# Patient Record
Sex: Female | Born: 1965
Health system: Southern US, Community
[De-identification: ages and names within clinical notes are randomized; demographics above are authoritative.]

## PROBLEM LIST (undated history)

## (undated) ENCOUNTER — Emergency Department (HOSPITAL_BASED_OUTPATIENT_CLINIC_OR_DEPARTMENT_OTHER): Admission: EM | Payer: BLUE CROSS/BLUE SHIELD | Source: Home / Self Care

## (undated) DIAGNOSIS — B1920 Unspecified viral hepatitis C without hepatic coma: Secondary | ICD-10-CM

## (undated) DIAGNOSIS — E049 Nontoxic goiter, unspecified: Secondary | ICD-10-CM

## (undated) DIAGNOSIS — E559 Vitamin D deficiency, unspecified: Secondary | ICD-10-CM

## (undated) DIAGNOSIS — B019 Varicella without complication: Secondary | ICD-10-CM

## (undated) DIAGNOSIS — E669 Obesity, unspecified: Secondary | ICD-10-CM

## (undated) DIAGNOSIS — I10 Essential (primary) hypertension: Secondary | ICD-10-CM

## (undated) DIAGNOSIS — K760 Fatty (change of) liver, not elsewhere classified: Secondary | ICD-10-CM

## (undated) DIAGNOSIS — E119 Type 2 diabetes mellitus without complications: Secondary | ICD-10-CM

## (undated) DIAGNOSIS — F329 Major depressive disorder, single episode, unspecified: Secondary | ICD-10-CM

## (undated) DIAGNOSIS — R739 Hyperglycemia, unspecified: Secondary | ICD-10-CM

## (undated) DIAGNOSIS — E079 Disorder of thyroid, unspecified: Secondary | ICD-10-CM

## (undated) DIAGNOSIS — E785 Hyperlipidemia, unspecified: Secondary | ICD-10-CM

## (undated) DIAGNOSIS — F419 Anxiety disorder, unspecified: Secondary | ICD-10-CM

## (undated) DIAGNOSIS — Z0000 Encounter for general adult medical examination without abnormal findings: Secondary | ICD-10-CM

## (undated) DIAGNOSIS — A078 Other specified protozoal intestinal diseases: Secondary | ICD-10-CM

## (undated) DIAGNOSIS — G4730 Sleep apnea, unspecified: Secondary | ICD-10-CM

## (undated) DIAGNOSIS — T7840XA Allergy, unspecified, initial encounter: Secondary | ICD-10-CM

## (undated) DIAGNOSIS — J189 Pneumonia, unspecified organism: Secondary | ICD-10-CM

## (undated) DIAGNOSIS — Z124 Encounter for screening for malignant neoplasm of cervix: Secondary | ICD-10-CM

## (undated) HISTORY — DX: Nontoxic goiter, unspecified: E04.9

## (undated) HISTORY — DX: Varicella without complication: B01.9

## (undated) HISTORY — DX: Allergy, unspecified, initial encounter: T78.40XA

## (undated) HISTORY — DX: Hyperlipidemia, unspecified: E78.5

## (undated) HISTORY — DX: Type 2 diabetes mellitus without complications: E11.9

## (undated) HISTORY — DX: Sleep apnea, unspecified: G47.30

## (undated) HISTORY — DX: Obesity, unspecified: E66.9

## (undated) HISTORY — DX: Major depressive disorder, single episode, unspecified: F32.9

## (undated) HISTORY — DX: Hyperglycemia, unspecified: R73.9

## (undated) HISTORY — DX: Vitamin D deficiency, unspecified: E55.9

## (undated) HISTORY — DX: Encounter for screening for malignant neoplasm of cervix: Z12.4

## (undated) HISTORY — DX: Anxiety disorder, unspecified: F41.9

## (undated) HISTORY — DX: Pneumonia, unspecified organism: J18.9

## (undated) HISTORY — DX: Disorder of thyroid, unspecified: E07.9

## (undated) HISTORY — PX: OTHER SURGICAL HISTORY: SHX169

## (undated) HISTORY — PX: WISDOM TOOTH EXTRACTION: SHX21

## (undated) HISTORY — DX: Other specified protozoal intestinal diseases: A07.8

## (undated) HISTORY — DX: Encounter for general adult medical examination without abnormal findings: Z00.00

---

## 2000-02-04 ENCOUNTER — Encounter: Payer: Self-pay | Admitting: Emergency Medicine

## 2000-02-04 ENCOUNTER — Emergency Department (HOSPITAL_COMMUNITY): Admission: EM | Admit: 2000-02-04 | Discharge: 2000-02-04 | Payer: Self-pay | Admitting: Emergency Medicine

## 2000-06-01 ENCOUNTER — Encounter: Payer: Self-pay | Admitting: Emergency Medicine

## 2000-06-01 ENCOUNTER — Emergency Department (HOSPITAL_COMMUNITY): Admission: EM | Admit: 2000-06-01 | Discharge: 2000-06-01 | Payer: Self-pay | Admitting: Emergency Medicine

## 2000-07-09 ENCOUNTER — Encounter: Payer: Self-pay | Admitting: Orthopedic Surgery

## 2000-07-09 ENCOUNTER — Encounter: Admission: RE | Admit: 2000-07-09 | Discharge: 2000-07-09 | Payer: Self-pay | Admitting: Orthopedic Surgery

## 2000-07-21 ENCOUNTER — Encounter: Admission: RE | Admit: 2000-07-21 | Discharge: 2000-08-25 | Payer: Self-pay | Admitting: Orthopedic Surgery

## 2000-12-21 ENCOUNTER — Emergency Department (HOSPITAL_COMMUNITY): Admission: EM | Admit: 2000-12-21 | Discharge: 2000-12-21 | Payer: Self-pay | Admitting: Emergency Medicine

## 2001-08-02 ENCOUNTER — Emergency Department (HOSPITAL_COMMUNITY): Admission: EM | Admit: 2001-08-02 | Discharge: 2001-08-02 | Payer: Self-pay | Admitting: *Deleted

## 2001-08-03 ENCOUNTER — Emergency Department (HOSPITAL_COMMUNITY): Admission: EM | Admit: 2001-08-03 | Discharge: 2001-08-03 | Payer: Self-pay | Admitting: Emergency Medicine

## 2002-04-20 ENCOUNTER — Emergency Department (HOSPITAL_COMMUNITY): Admission: EM | Admit: 2002-04-20 | Discharge: 2002-04-20 | Payer: Self-pay | Admitting: Emergency Medicine

## 2002-04-22 ENCOUNTER — Encounter: Payer: Self-pay | Admitting: Emergency Medicine

## 2002-04-22 ENCOUNTER — Ambulatory Visit (HOSPITAL_COMMUNITY): Admission: RE | Admit: 2002-04-22 | Discharge: 2002-04-22 | Payer: Self-pay | Admitting: Emergency Medicine

## 2002-05-02 ENCOUNTER — Emergency Department (HOSPITAL_COMMUNITY): Admission: EM | Admit: 2002-05-02 | Discharge: 2002-05-03 | Payer: Self-pay | Admitting: Emergency Medicine

## 2003-05-01 ENCOUNTER — Emergency Department (HOSPITAL_COMMUNITY): Admission: EM | Admit: 2003-05-01 | Discharge: 2003-05-01 | Payer: Self-pay | Admitting: *Deleted

## 2004-05-07 ENCOUNTER — Other Ambulatory Visit: Admission: RE | Admit: 2004-05-07 | Discharge: 2004-05-07 | Payer: Self-pay | Admitting: Obstetrics and Gynecology

## 2005-02-17 ENCOUNTER — Emergency Department (HOSPITAL_COMMUNITY): Admission: EM | Admit: 2005-02-17 | Discharge: 2005-02-17 | Payer: Self-pay | Admitting: Emergency Medicine

## 2007-05-16 ENCOUNTER — Emergency Department (HOSPITAL_COMMUNITY): Admission: EM | Admit: 2007-05-16 | Discharge: 2007-05-16 | Payer: Self-pay | Admitting: Emergency Medicine

## 2007-12-30 ENCOUNTER — Emergency Department (HOSPITAL_BASED_OUTPATIENT_CLINIC_OR_DEPARTMENT_OTHER): Admission: EM | Admit: 2007-12-30 | Discharge: 2007-12-30 | Payer: Self-pay | Admitting: Emergency Medicine

## 2008-05-22 ENCOUNTER — Ambulatory Visit: Payer: Self-pay | Admitting: Radiology

## 2008-05-22 ENCOUNTER — Emergency Department (HOSPITAL_BASED_OUTPATIENT_CLINIC_OR_DEPARTMENT_OTHER): Admission: EM | Admit: 2008-05-22 | Discharge: 2008-05-22 | Payer: Self-pay

## 2008-09-06 ENCOUNTER — Emergency Department (HOSPITAL_BASED_OUTPATIENT_CLINIC_OR_DEPARTMENT_OTHER): Admission: EM | Admit: 2008-09-06 | Discharge: 2008-09-06 | Payer: Self-pay | Admitting: Emergency Medicine

## 2008-11-20 ENCOUNTER — Emergency Department (HOSPITAL_BASED_OUTPATIENT_CLINIC_OR_DEPARTMENT_OTHER): Admission: EM | Admit: 2008-11-20 | Discharge: 2008-11-20 | Payer: Self-pay | Admitting: Emergency Medicine

## 2008-11-20 ENCOUNTER — Ambulatory Visit: Payer: Self-pay | Admitting: Diagnostic Radiology

## 2009-01-14 ENCOUNTER — Ambulatory Visit: Payer: Self-pay | Admitting: Diagnostic Radiology

## 2009-01-14 ENCOUNTER — Emergency Department (HOSPITAL_BASED_OUTPATIENT_CLINIC_OR_DEPARTMENT_OTHER): Admission: EM | Admit: 2009-01-14 | Discharge: 2009-01-14 | Payer: Self-pay | Admitting: Emergency Medicine

## 2009-03-09 ENCOUNTER — Emergency Department (HOSPITAL_BASED_OUTPATIENT_CLINIC_OR_DEPARTMENT_OTHER): Admission: EM | Admit: 2009-03-09 | Discharge: 2009-03-09 | Payer: Self-pay | Admitting: Emergency Medicine

## 2009-06-24 ENCOUNTER — Emergency Department (HOSPITAL_BASED_OUTPATIENT_CLINIC_OR_DEPARTMENT_OTHER): Admission: EM | Admit: 2009-06-24 | Discharge: 2009-06-24 | Payer: Self-pay | Admitting: Emergency Medicine

## 2009-07-01 ENCOUNTER — Emergency Department (HOSPITAL_BASED_OUTPATIENT_CLINIC_OR_DEPARTMENT_OTHER): Admission: EM | Admit: 2009-07-01 | Discharge: 2009-07-01 | Payer: Self-pay | Admitting: Emergency Medicine

## 2009-10-26 ENCOUNTER — Ambulatory Visit: Payer: Self-pay | Admitting: Radiology

## 2009-10-26 ENCOUNTER — Emergency Department (HOSPITAL_BASED_OUTPATIENT_CLINIC_OR_DEPARTMENT_OTHER): Admission: EM | Admit: 2009-10-26 | Discharge: 2009-10-26 | Payer: Self-pay | Admitting: Emergency Medicine

## 2009-10-27 ENCOUNTER — Ambulatory Visit (HOSPITAL_COMMUNITY): Admission: RE | Admit: 2009-10-27 | Discharge: 2009-10-27 | Payer: Self-pay | Admitting: Emergency Medicine

## 2009-10-27 ENCOUNTER — Ambulatory Visit: Payer: Self-pay | Admitting: Vascular Surgery

## 2010-03-22 ENCOUNTER — Emergency Department (HOSPITAL_BASED_OUTPATIENT_CLINIC_OR_DEPARTMENT_OTHER)
Admission: EM | Admit: 2010-03-22 | Discharge: 2010-03-22 | Payer: Self-pay | Source: Home / Self Care | Admitting: Emergency Medicine

## 2010-03-25 LAB — URINALYSIS, ROUTINE W REFLEX MICROSCOPIC
Bilirubin Urine: NEGATIVE
Hgb urine dipstick: NEGATIVE
Ketones, ur: NEGATIVE mg/dL
Specific Gravity, Urine: 1.027 (ref 1.005–1.030)
Urine Glucose, Fasting: NEGATIVE mg/dL
pH: 6 (ref 5.0–8.0)

## 2010-03-25 LAB — COMPREHENSIVE METABOLIC PANEL
Albumin: 3.8 g/dL (ref 3.5–5.2)
Alkaline Phosphatase: 103 U/L (ref 39–117)
BUN: 17 mg/dL (ref 6–23)
Calcium: 9.5 mg/dL (ref 8.4–10.5)
Creatinine, Ser: 0.9 mg/dL (ref 0.4–1.2)
Glucose, Bld: 104 mg/dL — ABNORMAL HIGH (ref 70–99)
Potassium: 3.6 mEq/L (ref 3.5–5.1)
Total Protein: 7.6 g/dL (ref 6.0–8.3)

## 2010-03-25 LAB — POCT CARDIAC MARKERS
CKMB, poc: 1.2 ng/mL (ref 1.0–8.0)
Myoglobin, poc: 66.9 ng/mL (ref 12–200)
Troponin i, poc: <0.05 ng/mL (ref 0.00–0.09)

## 2010-03-25 LAB — DIFFERENTIAL
Basophils Absolute: 0 10*3/uL (ref 0.0–0.1)
Eosinophils Absolute: 0.2 10*3/uL (ref 0.0–0.7)
Lymphocytes Relative: 43 % (ref 12–46)
Monocytes Absolute: 0.4 10*3/uL (ref 0.1–1.0)
Neutro Abs: 2.5 10*3/uL (ref 1.7–7.7)
Neutrophils Relative %: 46 % (ref 43–77)

## 2010-03-25 LAB — LIPASE, BLOOD: Lipase: 113 U/L (ref 23–300)

## 2010-03-25 LAB — CBC
MCHC: 33.2 g/dL (ref 30.0–36.0)
MCV: 74.6 fL — ABNORMAL LOW (ref 78.0–100.0)
Platelets: 208 10*3/uL (ref 150–400)
RDW: 14.6 % (ref 11.5–15.5)
WBC: 5.5 10*3/uL (ref 4.0–10.5)

## 2010-06-09 LAB — D-DIMER, QUANTITATIVE: D-Dimer, Quant: 0.29 ug/mL-FEU (ref 0.00–0.48)

## 2010-06-13 LAB — POCT CARDIAC MARKERS
Myoglobin, poc: 45.9 ng/mL (ref 12–200)
Troponin i, poc: <0.05 ng/mL (ref 0.00–0.09)

## 2010-06-13 LAB — DIFFERENTIAL
Basophils Relative: 1 % (ref 0–1)
Eosinophils Absolute: 0.2 10*3/uL (ref 0.0–0.7)
Lymphs Abs: 2.8 10*3/uL (ref 0.7–4.0)
Monocytes Absolute: 0.4 10*3/uL (ref 0.1–1.0)
Monocytes Relative: 6 % (ref 3–12)
Neutro Abs: 3.6 10*3/uL (ref 1.7–7.7)
Neutrophils Relative %: 51 % (ref 43–77)

## 2010-06-13 LAB — CBC
Hemoglobin: 12.8 g/dL (ref 12.0–15.0)
MCHC: 33.6 g/dL (ref 30.0–36.0)
MCV: 77.6 fL — ABNORMAL LOW (ref 78.0–100.0)
RBC: 4.9 MIL/uL (ref 3.87–5.11)
WBC: 7 10*3/uL (ref 4.0–10.5)

## 2010-06-13 LAB — BASIC METABOLIC PANEL
CO2: 25 mEq/L (ref 19–32)
Chloride: 106 mEq/L (ref 96–112)
Creatinine, Ser: 0.8 mg/dL (ref 0.4–1.2)
GFR calc Af Amer: >60 mL/min (ref >60)
Sodium: 138 mEq/L (ref 135–145)

## 2010-08-26 ENCOUNTER — Emergency Department (HOSPITAL_BASED_OUTPATIENT_CLINIC_OR_DEPARTMENT_OTHER)
Admission: EM | Admit: 2010-08-26 | Discharge: 2010-08-26 | Disposition: A | Discharge status: Home / Self Care | Payer: Self-pay | Attending: Emergency Medicine | Admitting: Emergency Medicine

## 2010-08-26 ENCOUNTER — Emergency Department (INDEPENDENT_AMBULATORY_CARE_PROVIDER_SITE_OTHER): Payer: Self-pay

## 2010-08-26 DIAGNOSIS — I517 Cardiomegaly: Secondary | ICD-10-CM

## 2010-08-26 DIAGNOSIS — J069 Acute upper respiratory infection, unspecified: Secondary | ICD-10-CM | POA: Insufficient documentation

## 2010-08-26 DIAGNOSIS — R05 Cough: Secondary | ICD-10-CM

## 2010-08-26 DIAGNOSIS — R0989 Other specified symptoms and signs involving the circulatory and respiratory systems: Secondary | ICD-10-CM

## 2010-08-26 DIAGNOSIS — R059 Cough, unspecified: Secondary | ICD-10-CM

## 2010-08-26 DIAGNOSIS — I10 Essential (primary) hypertension: Secondary | ICD-10-CM | POA: Insufficient documentation

## 2010-11-25 LAB — POCT PREGNANCY, URINE
Operator id: 19646
Preg Test, Ur: NEGATIVE

## 2010-12-08 ENCOUNTER — Encounter: Payer: Self-pay | Admitting: *Deleted

## 2010-12-08 ENCOUNTER — Emergency Department (HOSPITAL_BASED_OUTPATIENT_CLINIC_OR_DEPARTMENT_OTHER)
Admission: EM | Admit: 2010-12-08 | Discharge: 2010-12-08 | Disposition: A | Discharge status: Home / Self Care | Payer: 59 | Attending: Emergency Medicine | Admitting: Emergency Medicine

## 2010-12-08 DIAGNOSIS — X58XXXA Exposure to other specified factors, initial encounter: Secondary | ICD-10-CM | POA: Insufficient documentation

## 2010-12-08 DIAGNOSIS — S025XXA Fracture of tooth (traumatic), initial encounter for closed fracture: Secondary | ICD-10-CM | POA: Insufficient documentation

## 2010-12-08 DIAGNOSIS — I10 Essential (primary) hypertension: Secondary | ICD-10-CM | POA: Insufficient documentation

## 2010-12-08 DIAGNOSIS — K0889 Other specified disorders of teeth and supporting structures: Secondary | ICD-10-CM

## 2010-12-08 HISTORY — DX: Essential (primary) hypertension: I10

## 2010-12-08 MED ORDER — HYDROCODONE-ACETAMINOPHEN 5-500 MG PO TABS: 1.0000 | ORAL_TABLET | Freq: Four times a day (QID) | ORAL | Status: AC | PRN

## 2010-12-08 MED ORDER — PENICILLIN V POTASSIUM 500 MG PO TABS: 500.0000 mg | ORAL_TABLET | Freq: Three times a day (TID) | ORAL | Status: AC

## 2010-12-08 NOTE — ED Notes (Signed)
Pt states her tooth (left lower) broke off yesterday. Now c/o pain to same

## 2010-12-08 NOTE — ED Provider Notes (Signed)
Scribed for Geoffery Lyons, MD, the patient was seen in room MH11/MH11 . This chart was scribed by Ellie Lunch. This patient's care was started at 8:06 PM.   CSN: [id number] Arrival date & time: 12/08/2010  7:37 PM  Chief Complaint  Patient presents with  . Dental Pain    (Consider location/radiation/quality/duration/timing/severity/associated sxs/prior treatment) HPI Jennifer Rich is a 45 y.o. female who presents to the Emergency Department complaining of dental pain. Pt reports her lower left tooth broke yesterday and has been constantly painful since breaking. Pt was eating popcorn and broke her tooth on both sides on a kernel. Pain is rated 8/10 in severity. Reports pain in tongue when she scraped over the top of her broken tooth. Denies swelling in cheek or neck.    Past Medical History  Diagnosis Date  . Hypertension     History reviewed. No pertinent past surgical history.  History reviewed. No pertinent family history.  History  Substance Use Topics  . Smoking status: Never Smoker   . Smokeless tobacco: Not on file  . Alcohol Use: No   Review of Systems 10 Systems reviewed and are negative for acute change except as noted in the HPI.  Allergies  Review of patient's allergies indicates no known allergies.  Home Medications   Current Outpatient Rx  Name Route Sig Dispense Refill  . IBUPROFEN 200 MG PO TABS Oral Take 400 mg by mouth once as needed. For pain     . NEBIVOLOL HCL 10 MG PO TABS Oral Take 10 mg by mouth daily.      Marland Kitchen PRESCRIPTION MEDICATION Oral Take 1 tablet by mouth daily. Blood pressure       BP 154/99  Pulse 74  Temp(Src) 98.1 F (36.7 C) (Oral)  Resp 20  Ht 5\' 7"  (1.702 m)  Wt 295 lb (133.811 kg)  BMI 46.20 kg/m2  SpO2 95%  LMP 12/02/2010  Physical Exam  Constitutional: She is oriented to person, place, and time. She appears well-developed and well-nourished.  HENT:  Head: Normocephalic.       Bottom left rear molar missing pieces  of tooth. Remnants of filling present. No erythema or swelling of gums.   Eyes: EOM are normal.  Neck: Normal range of motion.  Pulmonary/Chest: Effort normal.  Musculoskeletal: Normal range of motion.  Lymphadenopathy:    She has no cervical adenopathy.  Neurological: She is alert and oriented to person, place, and time.  Psychiatric: She has a normal mood and affect.   Procedures (including critical care time)  OTHER DATA REVIEWED: Nursing notes, vital signs, and past medical records reviewed.   DIAGNOSTIC STUDIES: Oxygen Saturation is 95% on room air, adequate by my interpretation.    ED COURSE / COORDINATION OF CARE: 20:15 Discussed f/u with dentist. Recommended covering tooth with OTC wax to prevent tongue from scraping. Plan to treat with abx to prevent infection.   MDM:   DISCHARGE MEDICATIONS: New Prescriptions   HYDROCODONE-ACETAMINOPHEN (VICODIN) 5-500 MG PER TABLET    Take 1-2 tablets by mouth every 6 (six) hours as needed for pain.   PENICILLIN V POTASSIUM (VEETID) 500 MG TABLET    Take 1 tablet (500 mg total) by mouth 3 (three) times daily.    SCRIBE ATTESTATION: I personally performed the services described in this documentation, which was scribed in my presence. The recorded information has been reviewed and considered.           Geoffery Lyons, MD 12/08/10 [phone]

## 2011-05-23 ENCOUNTER — Emergency Department (HOSPITAL_BASED_OUTPATIENT_CLINIC_OR_DEPARTMENT_OTHER)
Admission: EM | Admit: 2011-05-23 | Discharge: 2011-05-23 | Disposition: A | Discharge status: Home / Self Care | Payer: 59 | Attending: Emergency Medicine | Admitting: Emergency Medicine

## 2011-05-23 ENCOUNTER — Encounter (HOSPITAL_BASED_OUTPATIENT_CLINIC_OR_DEPARTMENT_OTHER): Payer: Self-pay | Admitting: Family Medicine

## 2011-05-23 DIAGNOSIS — R197 Diarrhea, unspecified: Secondary | ICD-10-CM | POA: Insufficient documentation

## 2011-05-23 DIAGNOSIS — I10 Essential (primary) hypertension: Secondary | ICD-10-CM | POA: Insufficient documentation

## 2011-05-23 DIAGNOSIS — R112 Nausea with vomiting, unspecified: Secondary | ICD-10-CM | POA: Insufficient documentation

## 2011-05-23 DIAGNOSIS — IMO0001 Reserved for inherently not codable concepts without codable children: Secondary | ICD-10-CM | POA: Insufficient documentation

## 2011-05-23 LAB — URINALYSIS, MICROSCOPIC ONLY
Bilirubin Urine: NEGATIVE
Glucose, UA: NEGATIVE mg/dL
Hgb urine dipstick: NEGATIVE
RBC / HPF: NONE SEEN RBC/hpf (ref <3)
Specific Gravity, Urine: 1.026 (ref 1.005–1.030)
Urobilinogen, UA: 0.2 mg/dL (ref 0.0–1.0)
WBC, UA: NONE SEEN WBC/hpf (ref <3)

## 2011-05-23 LAB — PREGNANCY, URINE: Preg Test, Ur: NEGATIVE

## 2011-05-23 MED ORDER — SODIUM CHLORIDE 0.9 % IV BOLUS (SEPSIS)
1000.0000 mL | Freq: Once | INTRAVENOUS | Status: AC
  Administered 2011-05-23: 1000 mL via INTRAVENOUS

## 2011-05-23 MED ORDER — ONDANSETRON 8 MG PO TBDP: 8.0000 mg | ORAL_TABLET | Freq: Three times a day (TID) | ORAL | Status: AC | PRN

## 2011-05-23 MED ORDER — ONDANSETRON HCL 4 MG/2ML IJ SOLN
4.0000 mg | Freq: Once | INTRAMUSCULAR | Status: AC
  Administered 2011-05-23: 4 mg via INTRAVENOUS
  Filled 2011-05-23: qty 2

## 2011-05-23 MED ORDER — MORPHINE SULFATE 4 MG/ML IJ SOLN
4.0000 mg | Freq: Once | INTRAMUSCULAR | Status: AC
  Administered 2011-05-23: 4 mg via INTRAVENOUS
  Filled 2011-05-23: qty 1

## 2011-05-23 NOTE — ED Notes (Signed)
Pt c/o generalized body aches, nausea and diarrhea since 6pm yesterday. Pt sts she took ibuprofen at home.

## 2011-05-23 NOTE — ED Provider Notes (Signed)
History     CSN: [id number]  Arrival date & time 05/23/11  1008   First MD Initiated Contact with Patient 05/23/11 1009      Chief Complaint  Patient presents with  . Generalized Body Aches  . Diarrhea     HPI The patient reports nausea diarrhea one episode of vomiting since yesterday evening.  She reports no fever or chills.  She has no chest pain or shortness of breath.  She denies abdominal pain.  She denies dysuria and urinary frequency.  She has no sick contacts.  Nothing worsens her symptoms.  Nothing improves her symptoms.  Her symptoms are constant.  Past Medical History  Diagnosis Date  . Hypertension     History reviewed. No pertinent past surgical history.  No family history on file.  History  Substance Use Topics  . Smoking status: Never Smoker   . Smokeless tobacco: Not on file  . Alcohol Use: No    OB History    Grav Para Term Preterm Abortions TAB SAB Ect Mult Living                  Review of Systems  Allergies  Review of patient's allergies indicates no known allergies.  Home Medications   Current Outpatient Rx  Name Route Sig Dispense Refill  . IBUPROFEN 200 MG PO TABS Oral Take 400 mg by mouth once as needed. For pain     . NEBIVOLOL HCL 10 MG PO TABS Oral Take 10 mg by mouth daily.      Marland Kitchen ONDANSETRON 8 MG PO TBDP Oral Take 1 tablet (8 mg total) by mouth every 8 (eight) hours as needed for nausea. 10 tablet 0  . PRESCRIPTION MEDICATION Oral Take 1 tablet by mouth daily. Blood pressure       BP 159/103  Pulse 85  Temp(Src) 98 F (36.7 C) (Oral)  Resp 20  SpO2 97%  LMP 04/28/2011  Physical Exam  ED Course  Procedures (including critical care time)  Labs Reviewed  URINALYSIS, WITH MICROSCOPIC - Abnormal; Notable for the following:    APPearance CLOUDY (*)    Bacteria, UA FEW (*)    All other components within normal limits  PREGNANCY, URINE   No results found.   1. Nausea and vomiting       MDM  The patient felt much  better after antibiotics and fluids.  Her abdomen is benign on initial exam and repeat exam.  DC home in good condition with antiemetics        Lyanne Co, MD 05/23/11 1240

## 2011-09-15 ENCOUNTER — Other Ambulatory Visit (HOSPITAL_COMMUNITY)
Admission: RE | Admit: 2011-09-15 | Discharge: 2011-09-15 | Disposition: A | Discharge status: Home / Self Care | Payer: 59 | Source: Ambulatory Visit | Attending: Obstetrics and Gynecology | Admitting: Obstetrics and Gynecology

## 2011-09-15 ENCOUNTER — Other Ambulatory Visit: Payer: Self-pay | Admitting: Obstetrics and Gynecology

## 2011-09-15 DIAGNOSIS — Z124 Encounter for screening for malignant neoplasm of cervix: Secondary | ICD-10-CM | POA: Insufficient documentation

## 2011-09-15 DIAGNOSIS — Z139 Encounter for screening, unspecified: Secondary | ICD-10-CM

## 2011-09-21 ENCOUNTER — Other Ambulatory Visit: Payer: Self-pay

## 2011-09-21 DIAGNOSIS — R079 Chest pain, unspecified: Secondary | ICD-10-CM | POA: Insufficient documentation

## 2011-09-21 DIAGNOSIS — R110 Nausea: Secondary | ICD-10-CM | POA: Insufficient documentation

## 2011-09-21 DIAGNOSIS — I10 Essential (primary) hypertension: Secondary | ICD-10-CM | POA: Insufficient documentation

## 2011-09-21 DIAGNOSIS — M79609 Pain in unspecified limb: Secondary | ICD-10-CM | POA: Insufficient documentation

## 2011-09-22 ENCOUNTER — Other Ambulatory Visit (HOSPITAL_BASED_OUTPATIENT_CLINIC_OR_DEPARTMENT_OTHER): Payer: Self-pay | Admitting: Emergency Medicine

## 2011-09-22 ENCOUNTER — Ambulatory Visit (HOSPITAL_BASED_OUTPATIENT_CLINIC_OR_DEPARTMENT_OTHER)
Admission: RE | Admit: 2011-09-22 | Discharge: 2011-09-22 | Disposition: A | Discharge status: Home / Self Care | Payer: 59 | Source: Ambulatory Visit | Attending: Emergency Medicine | Admitting: Emergency Medicine

## 2011-09-22 ENCOUNTER — Emergency Department (HOSPITAL_BASED_OUTPATIENT_CLINIC_OR_DEPARTMENT_OTHER)
Admission: EM | Admit: 2011-09-22 | Discharge: 2011-09-22 | Disposition: A | Discharge status: Other Acute Inpatient Hospital | Payer: 59 | Attending: Emergency Medicine | Admitting: Emergency Medicine

## 2011-09-22 DIAGNOSIS — R079 Chest pain, unspecified: Secondary | ICD-10-CM | POA: Insufficient documentation

## 2011-09-22 LAB — CBC
MCV: 75.3 fL — ABNORMAL LOW (ref 78.0–100.0)
Platelets: 201 10*3/uL (ref 150–400)
RBC: 4.9 MIL/uL (ref 3.87–5.11)
RDW: 15.2 % (ref 11.5–15.5)
WBC: 5.4 10*3/uL (ref 4.0–10.5)

## 2011-09-22 LAB — DIFFERENTIAL
Band Neutrophils: 0 % (ref 0–10)
Basophils Absolute: 0 10*3/uL (ref 0.0–0.1)
Blasts: 0 %
Eosinophils Relative: 3 % (ref 0–5)
LUC, Absolute: 0 10*3/uL (ref 0.0–0.5)
LUCs, %: 0 % (ref 0–4)
Lymphocytes Relative: 48 % — ABNORMAL HIGH (ref 12–46)
Lymphs Abs: 2.6 10*3/uL (ref 0.7–4.0)
Neutrophils Relative %: 41 % — ABNORMAL LOW (ref 43–77)
Other 2: 0 %
Promyelocytes Absolute: 0 %

## 2011-09-22 LAB — TROPONIN I
Troponin I: <0.3 ng/mL (ref <0.30)
Troponin I: <0.3 ng/mL (ref <0.30)

## 2011-09-22 LAB — D-DIMER, QUANTITATIVE: D-Dimer, Quant: <0.22 ug/mL-FEU (ref 0.00–0.48)

## 2011-09-22 LAB — BASIC METABOLIC PANEL
Calcium: 9.3 mg/dL (ref 8.4–10.5)
Creatinine, Ser: 0.8 mg/dL (ref 0.50–1.10)
GFR calc Af Amer: >90 mL/min (ref >90)
GFR calc non Af Amer: 88 mL/min — ABNORMAL LOW (ref >90)
Sodium: 139 mEq/L (ref 135–145)

## 2011-09-22 MED FILL — Aspirin Tab Delayed Release 81 MG: ORAL | Qty: 4 | Status: AC

## 2011-09-22 NOTE — ED Provider Notes (Addendum)
Documentation performed on downtime forms.  Hanley Seamen, MD 09/22/11 1610  Hanley Seamen, MD 09/22/11 [phone]

## 2011-09-24 ENCOUNTER — Ambulatory Visit (HOSPITAL_BASED_OUTPATIENT_CLINIC_OR_DEPARTMENT_OTHER): Payer: 59

## 2011-11-02 LAB — HM MAMMOGRAPHY: HM Mammogram: NORMAL

## 2012-01-13 ENCOUNTER — Ambulatory Visit (HOSPITAL_BASED_OUTPATIENT_CLINIC_OR_DEPARTMENT_OTHER)
Admission: RE | Admit: 2012-01-13 | Discharge: 2012-01-13 | Disposition: A | Discharge status: Home / Self Care | Payer: 59 | Source: Ambulatory Visit | Attending: Obstetrics and Gynecology | Admitting: Obstetrics and Gynecology

## 2012-01-13 DIAGNOSIS — Z139 Encounter for screening, unspecified: Secondary | ICD-10-CM

## 2012-01-13 DIAGNOSIS — Z1231 Encounter for screening mammogram for malignant neoplasm of breast: Secondary | ICD-10-CM | POA: Insufficient documentation

## 2012-02-26 ENCOUNTER — Emergency Department (HOSPITAL_BASED_OUTPATIENT_CLINIC_OR_DEPARTMENT_OTHER)
Admission: EM | Admit: 2012-02-26 | Discharge: 2012-02-27 | Disposition: A | Discharge status: Home / Self Care | Payer: 59 | Attending: Emergency Medicine | Admitting: Emergency Medicine

## 2012-02-26 ENCOUNTER — Encounter (HOSPITAL_BASED_OUTPATIENT_CLINIC_OR_DEPARTMENT_OTHER): Payer: Self-pay | Admitting: *Deleted

## 2012-02-26 DIAGNOSIS — B379 Candidiasis, unspecified: Secondary | ICD-10-CM | POA: Insufficient documentation

## 2012-02-26 DIAGNOSIS — R197 Diarrhea, unspecified: Secondary | ICD-10-CM

## 2012-02-26 DIAGNOSIS — R5383 Other fatigue: Secondary | ICD-10-CM

## 2012-02-26 DIAGNOSIS — I10 Essential (primary) hypertension: Secondary | ICD-10-CM | POA: Insufficient documentation

## 2012-02-26 DIAGNOSIS — Z8701 Personal history of pneumonia (recurrent): Secondary | ICD-10-CM | POA: Insufficient documentation

## 2012-02-26 DIAGNOSIS — Z79899 Other long term (current) drug therapy: Secondary | ICD-10-CM | POA: Insufficient documentation

## 2012-02-26 HISTORY — DX: Pneumonia, unspecified organism: J18.9

## 2012-02-26 LAB — CBC
Platelets: 232 10*3/uL (ref 150–400)
RBC: 5.48 MIL/uL — ABNORMAL HIGH (ref 3.87–5.11)
RDW: 14.9 % (ref 11.5–15.5)
WBC: 9 10*3/uL (ref 4.0–10.5)

## 2012-02-26 LAB — COMPREHENSIVE METABOLIC PANEL
ALT: 8 U/L (ref 0–35)
AST: 15 U/L (ref 0–37)
Albumin: 3.6 g/dL (ref 3.5–5.2)
Alkaline Phosphatase: 79 U/L (ref 39–117)
CO2: 27 mEq/L (ref 19–32)
Chloride: 100 mEq/L (ref 96–112)
Potassium: 4.1 mEq/L (ref 3.5–5.1)
Total Bilirubin: 0.4 mg/dL (ref 0.3–1.2)

## 2012-02-26 MED ORDER — SODIUM CHLORIDE 0.9 % IV BOLUS (SEPSIS)
1000.0000 mL | Freq: Once | INTRAVENOUS | Status: AC
  Administered 2012-02-26: 1000 mL via INTRAVENOUS

## 2012-02-26 NOTE — ED Notes (Signed)
Prior entry by Alpha Gula, RN under [employer] sig. Pt ambulatory without need for assist

## 2012-02-26 NOTE — ED Notes (Signed)
Pt also c/o yeast infection.

## 2012-02-26 NOTE — ED Notes (Signed)
Attempted IV access x 2 without success.

## 2012-02-26 NOTE — ED Provider Notes (Addendum)
History     CSN: [id number]  Arrival date & time 02/26/12  1844   First MD Initiated Contact with Patient 02/26/12 2101      Chief Complaint  Patient presents with  . Fatigue    (Consider location/radiation/quality/duration/timing/severity/associated sxs/prior treatment) HPI Comments: Mrs. Vallely reports feeling weak and tired.  She was treated for pneumonia last week with a shot of penicillin and a course of azithromycin.  She began having diarrhea on day 2 of the azithromycin and has continued to have 5-6 watery bowel movements a day since.  She feels extremely tired as if she would pass out when she stands.  She denies ongoing fever or shortness of breath.  She also denies having any pain.  The history is provided by the patient. No language interpreter was used.    Past Medical History  Diagnosis Date  . Hypertension   . Pneumonia     History reviewed. No pertinent past surgical history.  No family history on file.  History  Substance Use Topics  . Smoking status: Never Smoker   . Smokeless tobacco: Never Used  . Alcohol Use: No    OB History    Grav Para Term Preterm Abortions TAB SAB Ect Mult Living                  Review of Systems  All other systems reviewed and are negative.    Allergies  Review of patient's allergies indicates no known allergies.  Home Medications   Current Outpatient Rx  Name  Route  Sig  Dispense  Refill  . ACETAMINOPHEN 500 MG PO TABS   Oral   Take 1,000 mg by mouth every 6 (six) hours as needed.         Marland Kitchen LOSARTAN POTASSIUM-HCTZ 100-25 MG PO TABS   Oral   Take 1 tablet by mouth daily.         Marland Kitchen MICONAZOLE NITRATE 200 MG VA SUPP   Vaginal   Place 200 mg vaginally at bedtime.         . NEBIVOLOL HCL 10 MG PO TABS   Oral   Take 10 mg by mouth daily.           . IBUPROFEN 200 MG PO TABS   Oral   Take 400 mg by mouth once as needed. For pain          . PREDNISONE (PAK) 10 MG PO TABS   Oral   Take 10  mg by mouth daily.         Marland Kitchen PRESCRIPTION MEDICATION   Oral   Take 1 tablet by mouth daily. Blood pressure            BP 153/107  Pulse 82  Temp 98 F (36.7 C) (Oral)  Resp 18  Ht 5\' 8"  (1.727 m)  Wt 315 lb (142.883 kg)  BMI 47.90 kg/m2  SpO2 97%  LMP 01/23/2012  Physical Exam  Nursing note and vitals reviewed. Constitutional: She is oriented to person, place, and time. She appears well-nourished. No distress.       Pt is morbidly obese   HENT:  Head: Normocephalic and atraumatic.  Right Ear: External ear normal.  Left Ear: External ear normal.  Nose: Nose normal.  Mouth/Throat: Oropharynx is clear and moist. No oropharyngeal exudate.  Eyes: Conjunctivae normal are normal. Pupils are equal, round, and reactive to light. Right eye exhibits no discharge. Left eye exhibits no discharge. No scleral icterus.  Neck: Normal range of motion. Neck supple. No JVD present. No tracheal deviation present.  Cardiovascular: Normal rate, regular rhythm, normal heart sounds and intact distal pulses.  Exam reveals no gallop and no friction rub.   No murmur heard. Pulmonary/Chest: Effort normal and breath sounds normal. No stridor. No respiratory distress. She has no wheezes. She has no rales. She exhibits no tenderness.  Abdominal: Soft. Bowel sounds are normal. She exhibits no distension and no mass. There is no tenderness. There is no rebound and no guarding.       Protuberant, obese   Musculoskeletal: Normal range of motion. She exhibits no edema and no tenderness.  Lymphadenopathy:    She has no cervical adenopathy.  Neurological: She is alert and oriented to person, place, and time. No cranial nerve deficit. Coordination normal.       Nl, confident gait   Skin: Skin is warm and dry. No rash noted. She is not diaphoretic. No erythema. No pallor.  Psychiatric: She has a normal mood and affect. Her behavior is normal.    ED Course  Procedures (including critical care  time)   Labs Reviewed  STOOL CULTURE  OVA AND PARASITE EXAMINATION  CLOSTRIDIUM DIFFICILE BY PCR  CBC  COMPREHENSIVE METABOLIC PANEL   No results found.   No diagnosis found.    MDM  Pt presents for evaluation of diarrhea and fatigue after being treated for pneumonia and a respiratory infection with an IM shot of penicillin and a course of azithromycin.  She appears nontoxic, NAD.  Will obtain stool studies if she has further BMs in the ER.  Will also obtain orthostatic VS and basic labs.  Will bolus IVF while awaiting results.   2956. Pt stable, NAD.  Note negative orthostatic VS.  Pt appears nontoxic.  BP is in fact elevated.  She has provided a sample of stool and stool studies including testing for c-difficile.  Pt describes taking azithromycin which is not typically associated with precipitating c-diff.  She is currently afebrile.  Will start po imodium.  She will be contacted if the stool studies return abnormal.  Will provide a prescription for diflucan to treat a yeast infection also.     Tobin Chad, MD 02/27/12 2130  Tobin Chad, MD 02/27/12 0111

## 2012-02-26 NOTE — ED Notes (Signed)
Pt dx with PNA 1 week ago- completed antibiotics and received steroid shots- states she is having diarrhea and feeling weak

## 2012-02-27 LAB — CLOSTRIDIUM DIFFICILE BY PCR: Toxigenic C. Difficile by PCR: NEGATIVE

## 2012-02-27 MED ORDER — FLUCONAZOLE 200 MG PO TABS: 200.0000 mg | ORAL_TABLET | Freq: Every day | ORAL | Status: AC

## 2012-02-27 MED ORDER — LOPERAMIDE HCL 2 MG PO CAPS: 2.0000 mg | ORAL_CAPSULE | Freq: Four times a day (QID) | ORAL | Status: DC | PRN

## 2012-03-01 LAB — OVA AND PARASITE EXAMINATION
Ova and parasites: NONE SEEN
Special Requests: NORMAL

## 2012-03-01 LAB — STOOL CULTURE

## 2012-04-30 ENCOUNTER — Ambulatory Visit: Payer: 59

## 2012-04-30 ENCOUNTER — Ambulatory Visit: Payer: 59 | Admitting: Family Medicine

## 2012-04-30 VITALS — BP 142/90 | HR 84 | Temp 98.2°F | Resp 16 | Ht 66.0 in | Wt 314.8 lb

## 2012-04-30 DIAGNOSIS — M25569 Pain in unspecified knee: Secondary | ICD-10-CM

## 2012-04-30 DIAGNOSIS — M25561 Pain in right knee: Secondary | ICD-10-CM

## 2012-04-30 DIAGNOSIS — T148XXA Other injury of unspecified body region, initial encounter: Secondary | ICD-10-CM

## 2012-04-30 MED ORDER — MELOXICAM 7.5 MG PO TABS: 7.5000 mg | ORAL_TABLET | Freq: Every day | ORAL | Status: DC

## 2012-04-30 NOTE — Progress Notes (Signed)
[facility]:  Office Visit  Chief Complaint:  Chief Complaint  Patient presents with  . Injury    RIGHT KNEE ON 04/29/12    HPI: Jennifer Rich is a 47 y.o. female who complains of right knee pain x 1 day s/p  Larey Seat out of tub yesterday. Able to put weight on it, she feels as if it is buckling, unstable when she walks on it. Does not want to bend all the way, She slipped in the bathtub and tried to catch herself. She did not hear a pop or click , she felt her thigh go in one direction and knee went in another directon. No swelling. Buckles every couple of steps. One minute she is fine then next she feels like she is going to go down. No prior knee injuries/surgeries. Denies osteoporosis or osteopenia.   Past Medical History  Diagnosis Date  . Hypertension   . Pneumonia    No past surgical history on file. History   Social History  . Marital Status: Married    Spouse Name: N/A    Number of Children: N/A  . Years of Education: N/A   Social History Main Topics  . Smoking status: Never Smoker   . Smokeless tobacco: Never Used  . Alcohol Use: No  . Drug Use: No  . Sexually Active: Yes    Birth Control/ Protection: None   Other Topics Concern  . None   Social History Narrative  . None   No family history on file. No Known Allergies Prior to Admission medications   Medication Sig Start Date End Date Taking? Authorizing Provider  acetaminophen (TYLENOL) 500 MG tablet Take 1,000 mg by mouth every 6 (six) hours as needed.   Yes Historical Provider, MD  ibuprofen (ADVIL,MOTRIN) 200 MG tablet Take 400 mg by mouth once as needed. For pain    Yes Historical Provider, MD  loperamide (IMODIUM) 2 MG capsule Take 1 capsule (2 mg total) by mouth 4 (four) times daily as needed for diarrhea or loose stools. 02/27/12  Yes Tobin Chad, MD  losartan-hydrochlorothiazide (HYZAAR) 100-25 MG per tablet Take 1 tablet by mouth daily.   Yes Historical Provider, MD   nebivolol (BYSTOLIC) 10 MG tablet Take 10 mg by mouth daily.     Yes Historical Provider, MD  miconazole (MICOTIN) 200 MG vaginal suppository Place 200 mg vaginally at bedtime.    Historical Provider, MD  predniSONE (STERAPRED UNI-PAK) 10 MG tablet Take 10 mg by mouth daily.    Historical Provider, MD  PRESCRIPTION MEDICATION Take 1 tablet by mouth daily. Blood pressure     Historical Provider, MD     ROS: The patient denies fevers, chills, night sweats, unintentional weight loss, chest pain, palpitations, wheezing, dyspnea on exertion, nausea, vomiting, abdominal pain, dysuria, hematuria, melena, numbness, weakness, or tingling.   All other systems have been reviewed and were otherwise negative with the exception of those mentioned in the HPI and as above.    PHYSICAL EXAM: Filed Vitals:   04/30/12 1337  BP: 142/90  Pulse: 84  Temp: 98.2 F (36.8 C)  Resp: 16   Filed Vitals:   04/30/12 1337  Height: 5\' 6"  (1.676 m)  Weight: 314 lb 12.8 oz (142.792 kg)   Body mass index is 50.83 kg/(m^2).  General: Alert, no acute distress. Obese AA female. HEENT:  Normocephalic, atraumatic, oropharynx patent.  Cardiovascular:  Regular rate and rhythm, no rubs murmurs or gallops.  No Carotid bruits, radial  pulse intact. No pedal edema.  Respiratory: Clear to auscultation bilaterally.  No wheezes, rales, or rhonchi.  No cyanosis, no use of accessory musculature GI: No organomegaly, abdomen is soft and non-tender, positive bowel sounds.  No masses. Skin: No rashes. Neurologic: Facial musculature symmetric. Psychiatric: Patient is appropriate throughout our interaction. Lymphatic: No cervical lymphadenopathy Musculoskeletal: Gait intact. Right knee-warmth, no crepitus, no deformities, minimal swelling Right knee-LCL tenderness Neg McMurray, neg lachman, neg jt line tenderness   LABS: Results for orders placed during the hospital encounter of 02/26/12  STOOL CULTURE      Result Value Range    Specimen Description STOOL     Special Requests Normal     Culture       Value: NO SALMONELLA, SHIGELLA, CAMPYLOBACTER, YERSINIA, OR E.COLI 0157:H7 ISOLATED     Note: REDUCED NORMAL FLORA PRESENT   Report Status 03/01/2012 FINAL    OVA AND PARASITE EXAMINATION      Result Value Range   Specimen Description STOOL     Special Requests Normal     Ova and parasites NO OVA OR PARASITES SEEN     Report Status 03/01/2012 FINAL    CLOSTRIDIUM DIFFICILE BY PCR      Result Value Range   C difficile by pcr NEGATIVE  NEGATIVE  CBC      Result Value Range   WBC 9.0  4.0 - 10.5 K/uL   RBC 5.48 (*) 3.87 - 5.11 MIL/uL   Hemoglobin 13.7  12.0 - 15.0 g/dL   HCT 09.6  04.5 - 40.9 %   MCV 75.0 (*) 78.0 - 100.0 fL   MCH 25.0 (*) 26.0 - 34.0 pg   MCHC 33.3  30.0 - 36.0 g/dL   RDW 81.1  91.4 - 78.2 %   Platelets 232  150 - 400 K/uL  COMPREHENSIVE METABOLIC PANEL      Result Value Range   Sodium 138  135 - 145 mEq/L   Potassium 4.1  3.5 - 5.1 mEq/L   Chloride 100  96 - 112 mEq/L   CO2 27  19 - 32 mEq/L   Glucose, Bld 106 (*) 70 - 99 mg/dL   BUN 21  6 - 23 mg/dL   Creatinine, Ser 9.56  0.50 - 1.10 mg/dL   Calcium 9.5  8.4 - 21.3 mg/dL   Total Protein 7.9  6.0 - 8.3 g/dL   Albumin 3.6  3.5 - 5.2 g/dL   AST 15  0 - 37 U/L   ALT 8  0 - 35 U/L   Alkaline Phosphatase 79  39 - 117 U/L   Total Bilirubin 0.4  0.3 - 1.2 mg/dL   GFR calc non Af Amer 67 (*) >90 mL/min   GFR calc Af Amer 78 (*) >90 mL/min     EKG/XRAY:   Primary read interpreted by Dr. Conley Rolls at [facility] 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 Opacity distal femur --? Bone infarct     ASSESSMENT/PLAN: Encounter Diagnoses  Name Primary?  . Knee pain, right Yes  . Sprain and strain    Patient has significant knee arthritis with possible sprain/strain of ligaments Rx mobic Knee brace F/u prn  If she needs ortho referral for worsening sxs then will refer to ortho  For  possible ? Mensicus tear if no improvement  Refer to [facility] Dr. Dion Saucier, they can do MRIs so I do not have to follow up with MRI.  Hamilton Capri PHUONG, DO 04/30/2012 2:51 PM

## 2012-05-24 ENCOUNTER — Emergency Department (HOSPITAL_BASED_OUTPATIENT_CLINIC_OR_DEPARTMENT_OTHER)
Admission: EM | Admit: 2012-05-24 | Discharge: 2012-05-24 | Disposition: A | Discharge status: Home / Self Care | Payer: 59 | Attending: Emergency Medicine | Admitting: Emergency Medicine

## 2012-05-24 ENCOUNTER — Encounter (HOSPITAL_BASED_OUTPATIENT_CLINIC_OR_DEPARTMENT_OTHER): Payer: Self-pay

## 2012-05-24 DIAGNOSIS — I10 Essential (primary) hypertension: Secondary | ICD-10-CM

## 2012-05-24 DIAGNOSIS — Z79899 Other long term (current) drug therapy: Secondary | ICD-10-CM | POA: Insufficient documentation

## 2012-05-24 DIAGNOSIS — Z8701 Personal history of pneumonia (recurrent): Secondary | ICD-10-CM | POA: Insufficient documentation

## 2012-05-24 DIAGNOSIS — R071 Chest pain on breathing: Secondary | ICD-10-CM | POA: Insufficient documentation

## 2012-05-24 DIAGNOSIS — R51 Headache: Secondary | ICD-10-CM | POA: Insufficient documentation

## 2012-05-24 DIAGNOSIS — R609 Edema, unspecified: Secondary | ICD-10-CM | POA: Insufficient documentation

## 2012-05-24 DIAGNOSIS — R0789 Other chest pain: Secondary | ICD-10-CM

## 2012-05-24 LAB — BASIC METABOLIC PANEL
BUN: 14 mg/dL (ref 6–23)
CO2: 29 mEq/L (ref 19–32)
Chloride: 100 mEq/L (ref 96–112)
Glucose, Bld: 122 mg/dL — ABNORMAL HIGH (ref 70–99)
Potassium: 3.5 mEq/L (ref 3.5–5.1)
Sodium: 138 mEq/L (ref 135–145)

## 2012-05-24 LAB — CBC
HCT: 38.6 % (ref 36.0–46.0)
Hemoglobin: 12.7 g/dL (ref 12.0–15.0)
RBC: 5.1 MIL/uL (ref 3.87–5.11)
WBC: 5.6 10*3/uL (ref 4.0–10.5)

## 2012-05-24 MED ORDER — ASPIRIN 81 MG PO CHEW
324.0000 mg | CHEWABLE_TABLET | Freq: Once | ORAL | Status: AC
  Administered 2012-05-24: 324 mg via ORAL

## 2012-05-24 MED ORDER — KETOROLAC TROMETHAMINE 30 MG/ML IJ SOLN
30.0000 mg | Freq: Once | INTRAMUSCULAR | Status: AC
  Administered 2012-05-24: 30 mg via INTRAVENOUS
  Filled 2012-05-24: qty 1

## 2012-05-24 MED ORDER — DIPHENHYDRAMINE HCL 50 MG/ML IJ SOLN
25.0000 mg | Freq: Once | INTRAMUSCULAR | Status: AC
  Administered 2012-05-24: 25 mg via INTRAVENOUS
  Filled 2012-05-24: qty 1

## 2012-05-24 MED ORDER — ASPIRIN 81 MG PO CHEW
CHEWABLE_TABLET | ORAL | Status: AC
  Administered 2012-05-24: 324 mg via ORAL
  Filled 2012-05-24: qty 4

## 2012-05-24 MED ORDER — METOCLOPRAMIDE HCL 10 MG PO TABS: 10.0000 mg | ORAL_TABLET | Freq: Four times a day (QID) | ORAL | Status: DC | PRN

## 2012-05-24 MED ORDER — METOCLOPRAMIDE HCL 5 MG/ML IJ SOLN
10.0000 mg | Freq: Once | INTRAMUSCULAR | Status: AC
  Administered 2012-05-24: 10 mg via INTRAVENOUS
  Filled 2012-05-24: qty 2

## 2012-05-24 NOTE — ED Provider Notes (Addendum)
History     CSN: [id number]  Arrival date & time 05/24/12  7829   First MD Initiated Contact with Patient 05/24/12 [phone]      Chief Complaint  Patient presents with  . Headache  . Chest Pain    (Consider location/radiation/quality/duration/timing/severity/associated sxs/prior treatment) Patient is a 47 y.o. female presenting with headaches and chest pain. The history is provided by the patient.  Headache Chest Pain Associated symptoms: headache   She noted onset last night of a sharp left upper anterior chest pain with some radiation of the left lateral chest. It was not bothering her a lot and thought it might have been gas. There is no associated dyspnea, nausea, diaphoresis. She onset about 2 AM of a left hemicranial headache. Headache is described as a throbbing and is moderately severe at 8/10. Nothing makes the headache better nothing makes it worse. She specifically denies photophobia and phonophobia. Neither pain is affected by body position or exertion. She is noted nausea and some mild lightheadedness with her headache. There's been no vomiting and no diaphoresis and she denies any dyspnea. She denies weakness, numbness, tingling. She's not taken any medication for either pain. She does state that this is how she feels when her blood pressure goes up. She does not monitor her blood pressure at home. She states she's been compliant with blood pressure medication.  Past Medical History  Diagnosis Date  . Hypertension   . Pneumonia     History reviewed. No pertinent past surgical history.  History reviewed. No pertinent family history.  History  Substance Use Topics  . Smoking status: Never Smoker   . Smokeless tobacco: Never Used  . Alcohol Use: No    OB History   Grav Para Term Preterm Abortions TAB SAB Ect Mult Living                  Review of Systems  Cardiovascular: Positive for chest pain.  Neurological: Positive for headaches.  All other systems reviewed  and are negative.    Allergies  Review of patient's allergies indicates no known allergies.  Home Medications   Current Outpatient Rx  Name  Route  Sig  Dispense  Refill  . acetaminophen (TYLENOL) 500 MG tablet   Oral   Take 1,000 mg by mouth every 6 (six) hours as needed.         Marland Kitchen losartan-hydrochlorothiazide (HYZAAR) 100-25 MG per tablet   Oral   Take 1 tablet by mouth daily.         . nebivolol (BYSTOLIC) 10 MG tablet   Oral   Take 10 mg by mouth daily.           Marland Kitchen ibuprofen (ADVIL,MOTRIN) 200 MG tablet   Oral   Take 400 mg by mouth once as needed. For pain          . meloxicam (MOBIC) 7.5 MG tablet   Oral   Take 1 tablet (7.5 mg total) by mouth daily. Take with food. No other NSAIDs.   30 tablet   1   . miconazole (MICOTIN) 200 MG vaginal suppository   Vaginal   Place 200 mg vaginally at bedtime.         Marland Kitchen PRESCRIPTION MEDICATION   Oral   Take 1 tablet by mouth daily. Blood pressure            BP 191/98  Pulse 68  Temp(Src) 98.4 F (36.9 C) (Oral)  Resp 20  Ht 5\' 7"  (  1.702 m)  Wt 315 lb (142.883 kg)  BMI 49.32 kg/m2  SpO2 96%  LMP 04/22/2012  Physical Exam  Nursing note and vitals reviewed.  Morbidly obese 47 year old female, resting comfortably and in no acute distress. Vital signs are significant for hypertension with blood pressure 191/98. Oxygen saturation is 96%, which is normal. Head is normocephalic and atraumatic. PERRLA, EOMI. Oropharynx is clear. Fundi show no hemorrhage, exudate, or papilledema. Neck is nontender and supple without adenopathy or JVD. Back is nontender and there is no CVA tenderness. Lungs are clear without rales, wheezes, or rhonchi. Chest is moderately tender across the left upper rib cage anteriorly. Heart has regular rate and rhythm without murmur. Abdomen is soft, flat, nontender without masses or hepatosplenomegaly and peristalsis is normoactive. Extremities have 2+ edema, full range of motion is  present. Skin is warm and dry without rash. Neurologic: Mental status is normal, cranial nerves are intact, there are no motor or sensory deficits.  ED Course  Procedures (including critical care time)  Results for orders placed during the hospital encounter of 05/24/12  CBC      Result Value Range   WBC 5.6  4.0 - 10.5 K/uL   RBC 5.10  3.87 - 5.11 MIL/uL   Hemoglobin 12.7  12.0 - 15.0 g/dL   HCT 16.1  09.6 - 04.5 %   MCV 75.7 (*) 78.0 - 100.0 fL   MCH 24.9 (*) 26.0 - 34.0 pg   MCHC 32.9  30.0 - 36.0 g/dL   RDW 40.9  81.1 - 91.4 %   Platelets 216  150 - 400 K/uL  BASIC METABOLIC PANEL      Result Value Range   Sodium 138  135 - 145 mEq/L   Potassium 3.5  3.5 - 5.1 mEq/L   Chloride 100  96 - 112 mEq/L   CO2 29  19 - 32 mEq/L   Glucose, Bld 122 (*) 70 - 99 mg/dL   BUN 14  6 - 23 mg/dL   Creatinine, Ser 7.82  0.50 - 1.10 mg/dL   Calcium 9.3  8.4 - 95.6 mg/dL   GFR calc non Af Amer 76 (*) >90 mL/min   GFR calc Af Amer 88 (*) >90 mL/min  TROPONIN I      Result Value Range   Troponin I <0.30  <0.30 ng/mL     Date: 05/24/2012  Rate: 60   Rhythm: normal sinus rhythm and sinus arrhythmia  QRS Axis: normal  Intervals: normal  ST/T Wave abnormalities: nonspecific T wave changes  Conduction Disutrbances:none  Narrative Interpretation: Nonspecific T wave flattening in diffuse leads. No prior ECG available for comparison.  Old EKG Reviewed: none available    1. Headache   2. Hypertension   3. Chest wall pain   4. Peripheral edema       MDM  Headache which as many of the characteristics of migraine headaches. Chest pain which seems most likely to be chest wall pain. She will be given a headache cocktail and ketorolac and response assessed. Also, she would probably benefit from more aggressive diuresis which would help with control of her blood pressure.  9:38 AM She got excellent relief of headache and chest pain with the above-noted medications. Blood pressure will be  repeated. She will be referred back to her PCP to adjust medications for blood pressure and diuresis.  Repeat blood pressure is markedly improved. I believe that her severely elevated blood pressure was secondary to her her pain  and not a cause of her headache. She is discharged with a prescription for metoclopramide and told to use over-the-counter NSAIDs as needed for recurrent chest wall pain.   Dione Booze, MD 05/24/12 8657  Dione Booze, MD 05/24/12 [phone]

## 2012-05-24 NOTE — ED Notes (Signed)
Pt states that she had onset of cp this morning about 0230 am, pt states that she also has really severe headache associated with nausea, no vomiting.  Pt states that she is lightheaded, dizzy, and short of breath.  Appears to be in nad at this time.  Pt has SPo2 of 98%

## 2012-06-22 ENCOUNTER — Encounter: Payer: Self-pay | Admitting: Cardiovascular Disease

## 2012-06-22 ENCOUNTER — Ambulatory Visit (INDEPENDENT_AMBULATORY_CARE_PROVIDER_SITE_OTHER): Payer: 59 | Admitting: Cardiovascular Disease

## 2012-06-22 VITALS — BP 148/85 | HR 77 | Ht 67.0 in | Wt 317.0 lb

## 2012-06-22 DIAGNOSIS — R079 Chest pain, unspecified: Secondary | ICD-10-CM | POA: Insufficient documentation

## 2012-06-22 DIAGNOSIS — I10 Essential (primary) hypertension: Secondary | ICD-10-CM | POA: Insufficient documentation

## 2012-06-22 NOTE — Patient Instructions (Addendum)
Your physician recommends that you schedule a follow-up appointment  As needed with Dr. McAlhany  Your physician has requested that you have an echocardiogram. Echocardiography is a painless test that uses sound waves to create images of your heart. It provides your doctor with information about the size and shape of your heart and how well your heart's chambers and valves are working. This procedure takes approximately one hour. There are no restrictions for this procedure.    

## 2012-06-22 NOTE — Progress Notes (Signed)
   History of Present Illness: 47 yo female with history of HTN who is here today as a new patient for evaluation of chest pain. She was seen in the ED at [facility] on 05/24/12 with c/o headache and chest wall pain. Her pain was felt to be related to a musculoskeletal etiology. Troponin negative. EKG with non-specific T wave flattening which was unchanged from prior EKG.   She describes a sharp right sided chest pain without radiation. No worsening with movement of her right arm. No changes with exertion. She mostly notices this pain while at work and after meals. No prior cardiac disease. She has never been a smoker. She does not use drugs.   Primary Care Physician: Eunice Blase [facility])  Past Medical History  Diagnosis Date  . Hypertension   . Pneumonia     Past Surgical History  Procedure Laterality Date  . None      Current Outpatient Prescriptions  Medication Sig Dispense Refill  . acetaminophen (TYLENOL) 500 MG tablet Take 1,000 mg by mouth every 6 (six) hours as needed.      Marland Kitchen ibuprofen (ADVIL,MOTRIN) 200 MG tablet Take 400 mg by mouth once as needed. For pain       . losartan-hydrochlorothiazide (HYZAAR) 100-25 MG per tablet Take 1 tablet by mouth daily.      . nebivolol (BYSTOLIC) 10 MG tablet Take 10 mg by mouth daily.         No current facility-administered medications for this visit.    No Known Allergies  History   Social History  . Marital Status: Married    Spouse Name: N/A    Number of Children: 0  . Years of Education: N/A   Occupational History  . SAL[employer]    Social History Main Topics  . Smoking status: Never Smoker   . Smokeless tobacco: Never Used  . Alcohol Use: No  . Drug Use: No  . Sexually Active: Yes    Birth Control/ Protection: None   Other Topics Concern  . Not on file   Social History Narrative  . No narrative on file    Family History  Problem Relation Age of Onset  . Heart attack Maternal  Grandfather 75    Review of Systems:  As stated in the HPI and otherwise negative.   BP 148/85  Pulse 77  Ht 5\' 7"  (1.702 m)  Wt 317 lb (143.79 kg)  BMI 49.64 kg/m2  LMP 04/22/2012  Physical Examination: General: Well developed, well nourished, NAD HEENT: OP clear, mucus membranes moist SKIN: warm, dry. No rashes. Neuro: No focal deficits Musculoskeletal: Muscle strength 5/5 all ext Psychiatric: Mood and affect normal Neck: No JVD, no carotid bruits, no thyromegaly, no lymphadenopathy. Lungs:Clear bilaterally, no wheezes, rhonci, crackles Cardiovascular: Regular rate and rhythm. No murmurs, gallops or rubs. Abdomen:Soft. Bowel sounds present. Non-tender.  Extremities: No lower extremity edema. Pulses are 2 + in the bilateral DP/PT.  EKG: 05/24/12: Reviewed. Sinus with non-specific T wave flattening, unchanged since 7/13  Assessment and Plan:   1. Chest pain: Mostly atypical and likely related to GERD. I will arrange an echo to assess LVEF and exclude structural heart disease. Will start Pepcid or Zantac to see if this helps her chest pain.   2. HTN: Followed in primary care.

## 2012-06-28 ENCOUNTER — Ambulatory Visit (HOSPITAL_COMMUNITY): Payer: 59 | Attending: Cardiovascular Disease | Admitting: Radiology

## 2012-06-28 DIAGNOSIS — R072 Precordial pain: Secondary | ICD-10-CM

## 2012-06-28 DIAGNOSIS — I10 Essential (primary) hypertension: Secondary | ICD-10-CM | POA: Insufficient documentation

## 2012-06-28 DIAGNOSIS — R079 Chest pain, unspecified: Secondary | ICD-10-CM | POA: Insufficient documentation

## 2012-06-28 NOTE — Progress Notes (Signed)
Echocardiogram performed.  

## 2012-08-03 ENCOUNTER — Encounter (HOSPITAL_BASED_OUTPATIENT_CLINIC_OR_DEPARTMENT_OTHER): Payer: Self-pay

## 2012-08-03 ENCOUNTER — Emergency Department (HOSPITAL_BASED_OUTPATIENT_CLINIC_OR_DEPARTMENT_OTHER)
Admission: EM | Admit: 2012-08-03 | Discharge: 2012-08-03 | Disposition: A | Discharge status: Home / Self Care | Payer: 59 | Attending: Emergency Medicine | Admitting: Emergency Medicine

## 2012-08-03 DIAGNOSIS — Y9389 Activity, other specified: Secondary | ICD-10-CM | POA: Insufficient documentation

## 2012-08-03 DIAGNOSIS — X500XXA Overexertion from strenuous movement or load, initial encounter: Secondary | ICD-10-CM | POA: Insufficient documentation

## 2012-08-03 DIAGNOSIS — I10 Essential (primary) hypertension: Secondary | ICD-10-CM | POA: Insufficient documentation

## 2012-08-03 DIAGNOSIS — Z79899 Other long term (current) drug therapy: Secondary | ICD-10-CM | POA: Insufficient documentation

## 2012-08-03 DIAGNOSIS — Y929 Unspecified place or not applicable: Secondary | ICD-10-CM | POA: Insufficient documentation

## 2012-08-03 DIAGNOSIS — Z8701 Personal history of pneumonia (recurrent): Secondary | ICD-10-CM | POA: Insufficient documentation

## 2012-08-03 DIAGNOSIS — S335XXA Sprain of ligaments of lumbar spine, initial encounter: Secondary | ICD-10-CM | POA: Insufficient documentation

## 2012-08-03 DIAGNOSIS — S39012A Strain of muscle, fascia and tendon of lower back, initial encounter: Secondary | ICD-10-CM

## 2012-08-03 MED ORDER — HYDROCODONE-ACETAMINOPHEN 5-325 MG PO TABS: ORAL_TABLET | ORAL | Status: DC

## 2012-08-03 MED ORDER — IBUPROFEN 600 MG PO TABS: 600.0000 mg | ORAL_TABLET | Freq: Three times a day (TID) | ORAL | Status: DC

## 2012-08-03 MED ORDER — CYCLOBENZAPRINE HCL 10 MG PO TABS: 10.0000 mg | ORAL_TABLET | Freq: Three times a day (TID) | ORAL | Status: DC | PRN

## 2012-08-03 NOTE — ED Notes (Addendum)
Lower right side back pain started after getting out of tub last night-states feels like a pulled muscle

## 2012-08-03 NOTE — Discharge Instructions (Signed)
 Back Pain, Adult Low back pain is very common. About 1 in 5 people have back pain.The cause of low back pain is rarely dangerous. The pain often gets better over time.About half of people with a sudden onset of back pain feel better in just 2 weeks. About 8 in 10 people feel better by 6 weeks.  CAUSES Some common causes of back pain include:  Strain of the muscles or ligaments supporting the spine.  Wear and tear (degeneration) of the spinal discs.  Arthritis.  Direct injury to the back. DIAGNOSIS Most of the time, the direct cause of low back pain is not known.However, back pain can be treated effectively even when the exact cause of the pain is unknown.Answering your caregiver's questions about your overall health and symptoms is one of the most accurate ways to make sure the cause of your pain is not dangerous. If your caregiver needs more information, he or she may order lab work or imaging tests (X-rays or MRIs).However, even if imaging tests show changes in your back, this usually does not require surgery. HOME CARE INSTRUCTIONS For many people, back pain returns.Since low back pain is rarely dangerous, it is often a condition that people can learn to [facility] their own.   Remain active. It is stressful on the back to sit or stand in one place. Do not sit, drive, or stand in one place for more than 30 minutes at a time. Take short walks on level surfaces as soon as pain allows.Try to increase the length of time you walk each day.  Do not stay in bed.Resting more than 1 or 2 days can delay your recovery.  Do not avoid exercise or work.Your body is made to move.It is not dangerous to be active, even though your back may hurt.Your back will likely heal faster if you return to being active before your pain is gone.  Pay attention to your body when you bend and lift. Many people have less discomfortwhen lifting if they bend their knees, keep the load close to their bodies,and  avoid twisting. Often, the most comfortable positions are those that put less stress on your recovering back.  Find a comfortable position to sleep. Use a firm mattress and lie on your side with your knees slightly bent. If you lie on your back, put a pillow under your knees.  Only take over-the-counter or prescription medicines as directed by your caregiver. Over-the-counter medicines to reduce pain and inflammation are often the most helpful.Your caregiver may prescribe muscle relaxant drugs.These medicines help dull your pain so you can more quickly return to your normal activities and healthy exercise.  Put ice on the injured area.  Put ice in a plastic bag.  Place a towel between your skin and the bag.  Leave the ice on for 15-20 minutes, 3-4 times a day for the first 2 to 3 days. After that, ice and heat may be alternated to reduce pain and spasms.  Ask your caregiver about trying back exercises and gentle massage. This may be of some benefit.  Avoid feeling anxious or stressed.Stress increases muscle tension and can worsen back pain.It is important to recognize when you are anxious or stressed and learn ways to manage it.Exercise is a great option. SEEK MEDICAL CARE IF:  You have pain that is not relieved with rest or medicine.  You have pain that does not improve in 1 week.  You have new symptoms.  You are generally not feeling well. SEEK  IMMEDIATE MEDICAL CARE IF:   You have pain that radiates from your back into your legs.  You develop new bowel or bladder control problems.  You have unusual weakness or numbness in your arms or legs.  You develop nausea or vomiting.  You develop abdominal pain.  You feel faint. Document Released: 02/17/2005 Document Revised: 08/19/2011 Document Reviewed: 07/08/2010 [facility] Patient Information 2014 [place], [place].    Narcotic and benzodiazepine use may cause drowsiness, slowed breathing or dependence.  Please use with  caution and do not drive, operate machinery or watch young children alone while taking them.  Taking combinations of these medications or drinking alcohol will potentiate these effects.

## 2012-08-03 NOTE — ED Provider Notes (Signed)
History     CSN: [id number]  Arrival date & time 08/03/12  1359   First MD Initiated Contact with Patient 08/03/12 1429      Chief Complaint  Patient presents with  . Back Pain    (Consider location/radiation/quality/duration/timing/severity/associated sxs/prior treatment) Patient is a 47 y.o. female presenting with back pain. The history is provided by the patient.  Back Pain Location:  Lumbar spine Quality:  Aching and stiffness Stiffness is present:  All day Radiates to:  Does not radiate Pain severity:  Moderate Pain is:  Same all the time Onset quality:  Gradual Duration:  1 day Timing:  Constant Progression:  Waxing and waning Chronicity:  New Context: twisting   Relieved by:  Being still Ineffective treatments:  Ibuprofen Associated symptoms: no abdominal pain, no bladder incontinence, no bowel incontinence, no dysuria, no fever, no numbness and no weakness   Risk factors: lack of exercise and obesity     Past Medical History  Diagnosis Date  . Hypertension   . Pneumonia     Past Surgical History  Procedure Laterality Date  . None      Family History  Problem Relation Age of Onset  . Heart attack Maternal Grandfather 75    History  Substance Use Topics  . Smoking status: Never Smoker   . Smokeless tobacco: Never Used  . Alcohol Use: No    OB History   Grav Para Term Preterm Abortions TAB SAB Ect Mult Living                  Review of Systems  Constitutional: Negative for fever.  Gastrointestinal: Negative for abdominal pain and bowel incontinence.  Genitourinary: Negative for bladder incontinence, dysuria, flank pain and difficulty urinating.  Musculoskeletal: Positive for back pain.  Skin: Negative for color change and rash.  Neurological: Negative for weakness and numbness.    Allergies  Review of patient's allergies indicates no known allergies.  Home Medications   Current Outpatient Rx  Name  Route  Sig  Dispense  Refill  .  acetaminophen (TYLENOL) 500 MG tablet   Oral   Take 1,000 mg by mouth every 6 (six) hours as needed.         . cyclobenzaprine (FLEXERIL) 10 MG tablet   Oral   Take 1 tablet (10 mg total) by mouth 3 (three) times daily as needed for muscle spasms.   20 tablet   0   . HYDROcodone-acetaminophen (NORCO/VICODIN) 5-325 MG per tablet      1-2 tablets po q 6 hours prn moderate to severe pain   20 tablet   0   . ibuprofen (ADVIL,MOTRIN) 200 MG tablet   Oral   Take 400 mg by mouth once as needed. For pain          . ibuprofen (ADVIL,MOTRIN) 600 MG tablet   Oral   Take 1 tablet (600 mg total) by mouth 3 (three) times daily. Take with food   21 tablet   0   . losartan-hydrochlorothiazide (HYZAAR) 100-25 MG per tablet   Oral   Take 1 tablet by mouth daily.         . nebivolol (BYSTOLIC) 10 MG tablet   Oral   Take 10 mg by mouth daily.             BP 176/95  Pulse 81  Temp(Src) 98.3 F (36.8 C) (Oral)  Resp 18  Ht 5\' 7"  (1.702 m)  Wt 315 lb (142.883 kg)  BMI 49.32 kg/m2  SpO2 97%  LMP 07/14/2012  Physical Exam  Nursing note and vitals reviewed. Constitutional: She is oriented to person, place, and time. She appears well-developed and well-nourished. No distress.  Pulmonary/Chest: Effort normal. No respiratory distress.  Abdominal: Soft.  Musculoskeletal:       Cervical back: Normal.       Thoracic back: Normal.       Lumbar back: She exhibits tenderness, pain and spasm. She exhibits no bony tenderness, no deformity, no laceration and normal pulse.       Back:  Neurological: She is alert and oriented to person, place, and time. She exhibits normal muscle tone. Coordination normal.  Skin: Skin is warm and dry. No rash noted.  Psychiatric: She has a normal mood and affect.    ED Course  Procedures (including critical care time)  Labs Reviewed - No data to display No results found.   1. Lumbar strain, initial encounter    ra sat is 97% and I interpret to  be normal   MDM  Pt with lumbar strain and spasm on right. No radicular symptoms.  RICE at home.  Will give Rx for norco and flexeril.  Refer to PCP and also to Dr. Pearletha Forge for follow up.  Pt drove self here.          Gavin Pound. Tenia Goh, MD 08/03/12 1504

## 2012-08-17 ENCOUNTER — Telehealth: Payer: Self-pay | Admitting: Cardiovascular Disease

## 2012-08-17 NOTE — Telephone Encounter (Signed)
New Problem:    Patient called in wanting to come in today to have labs drawn.  Please call back.

## 2012-08-17 NOTE — Telephone Encounter (Signed)
Spoke with pt who is asking if she should come in for lab work today due to blurry vision in her eye. Pt reports she woke up with blurred vision in right eye. Was fine last night. No headache, weakness or other symptoms except that ankle is bothering her. I have asked her to contact her primary MD to have this evaluated.  She is agreeable with this plan.

## 2012-09-30 ENCOUNTER — Emergency Department (HOSPITAL_BASED_OUTPATIENT_CLINIC_OR_DEPARTMENT_OTHER): Payer: 59

## 2012-09-30 ENCOUNTER — Encounter (HOSPITAL_BASED_OUTPATIENT_CLINIC_OR_DEPARTMENT_OTHER): Payer: Self-pay | Admitting: *Deleted

## 2012-09-30 ENCOUNTER — Emergency Department (HOSPITAL_BASED_OUTPATIENT_CLINIC_OR_DEPARTMENT_OTHER)
Admission: EM | Admit: 2012-09-30 | Discharge: 2012-09-30 | Disposition: A | Discharge status: Home / Self Care | Payer: 59 | Attending: Emergency Medicine | Admitting: Emergency Medicine

## 2012-09-30 DIAGNOSIS — J40 Bronchitis, not specified as acute or chronic: Secondary | ICD-10-CM

## 2012-09-30 DIAGNOSIS — J209 Acute bronchitis, unspecified: Secondary | ICD-10-CM | POA: Insufficient documentation

## 2012-09-30 DIAGNOSIS — Z8701 Personal history of pneumonia (recurrent): Secondary | ICD-10-CM | POA: Insufficient documentation

## 2012-09-30 DIAGNOSIS — I10 Essential (primary) hypertension: Secondary | ICD-10-CM | POA: Insufficient documentation

## 2012-09-30 DIAGNOSIS — J029 Acute pharyngitis, unspecified: Secondary | ICD-10-CM | POA: Insufficient documentation

## 2012-09-30 DIAGNOSIS — J3489 Other specified disorders of nose and nasal sinuses: Secondary | ICD-10-CM | POA: Insufficient documentation

## 2012-09-30 DIAGNOSIS — Z79899 Other long term (current) drug therapy: Secondary | ICD-10-CM | POA: Insufficient documentation

## 2012-09-30 MED ORDER — PREDNISONE 10 MG PO TABS: 40.0000 mg | ORAL_TABLET | Freq: Every day | ORAL | Status: AC

## 2012-09-30 MED ORDER — PREDNISONE 50 MG PO TABS
60.0000 mg | ORAL_TABLET | ORAL | Status: AC
  Administered 2012-09-30: 60 mg via ORAL
  Filled 2012-09-30: qty 1

## 2012-09-30 MED ORDER — BENZONATATE 100 MG PO CAPS: 100.0000 mg | ORAL_CAPSULE | Freq: Three times a day (TID) | ORAL | Status: DC | PRN

## 2012-09-30 NOTE — ED Notes (Signed)
Patient states she has a two day history of sinus congestion, sore throat, productive cough with greenish yellow secretions.  Associated with body aches, chills and sweating.

## 2012-09-30 NOTE — ED Provider Notes (Signed)
CSN: [id number]     Arrival date & time 09/30/12  0908 History     First MD Initiated Contact with Patient 09/30/12 [phone]     Chief Complaint  Patient presents with  . URI   (Consider location/radiation/quality/duration/timing/severity/associated sxs/prior Treatment) HPI  Patient presents with concern of cough, congestion, sore throat.  Symptoms began without clear precipitant 2 days ago.  Since onset symptoms have been progressive with no relief from OTC cold and sinus medication. Patient also complains of occasional chills and subjective fever, but has no objective fever at home. She denies concurrent confusion, disorientation, chest pain, belly pain, vomiting, diarrhea. She states that she has a history of one prior similar events that was diagnosed as pneumonia.   Past Medical History  Diagnosis Date  . Hypertension   . Pneumonia    Past Surgical History  Procedure Laterality Date  . None     Family History  Problem Relation Age of Onset  . Heart attack Maternal Grandfather 75   History  Substance Use Topics  . Smoking status: Never Smoker   . Smokeless tobacco: Never Used  . Alcohol Use: No   OB History   Grav Para Term Preterm Abortions TAB SAB Ect Mult Living                 Review of Systems  Constitutional:       Per HPI, otherwise negative  HENT:       Per HPI, otherwise negative  Respiratory:       Per HPI, otherwise negative  Cardiovascular:       Per HPI, otherwise negative  Gastrointestinal: Negative for vomiting.  Endocrine:       Negative aside from HPI  Genitourinary:       Neg aside from HPI   Musculoskeletal:       Per HPI, otherwise negative  Skin: Negative.   Neurological: Negative for syncope.    Allergies  Review of patient's allergies indicates no known allergies.  Home Medications   Current Outpatient Rx  Name  Route  Sig  Dispense  Refill  . acetaminophen (TYLENOL) 500 MG tablet   Oral   Take 1,000 mg by mouth every 6  (six) hours as needed.         . cyclobenzaprine (FLEXERIL) 10 MG tablet   Oral   Take 1 tablet (10 mg total) by mouth 3 (three) times daily as needed for muscle spasms.   20 tablet   0   . HYDROcodone-acetaminophen (NORCO/VICODIN) 5-325 MG per tablet      1-2 tablets po q 6 hours prn moderate to severe pain   20 tablet   0   . ibuprofen (ADVIL,MOTRIN) 200 MG tablet   Oral   Take 400 mg by mouth once as needed. For pain          . ibuprofen (ADVIL,MOTRIN) 600 MG tablet   Oral   Take 1 tablet (600 mg total) by mouth 3 (three) times daily. Take with food   21 tablet   0   . losartan-hydrochlorothiazide (HYZAAR) 100-25 MG per tablet   Oral   Take 1 tablet by mouth daily.         . nebivolol (BYSTOLIC) 10 MG tablet   Oral   Take 10 mg by mouth daily.            BP 171/91  Pulse 78  Temp(Src) 98.5 F (36.9 C) (Oral)  Resp 16  Ht 5'  7" (1.702 m)  Wt 315 lb (142.883 kg)  BMI 49.32 kg/m2  SpO2 97%  LMP 09/14/2012 Physical Exam  Nursing note and vitals reviewed. Constitutional: She is oriented to person, place, and time. She appears well-developed and well-nourished. No distress.  HENT:  Head: Normocephalic and atraumatic.  Eyes: Conjunctivae and EOM are normal.  Cardiovascular: Normal rate and regular rhythm.   Pulmonary/Chest: Effort normal. No stridor. No respiratory distress. She has decreased breath sounds. She has no wheezes.  Abdominal: She exhibits no distension.  Musculoskeletal: She exhibits no edema.  Neurological: She is alert and oriented to person, place, and time. No cranial nerve deficit.  Skin: Skin is warm and dry.  Psychiatric: She has a normal mood and affect.    ED Course   Procedures (including critical care time)  Labs Reviewed - No data to display Dg Chest 2 View  09/30/2012   *RADIOLOGY REPORT*  Clinical Data: Coughing.  Chills.  Fever.  Discomfort.  Body aches. History of hypertension.  CHEST - 2 VIEW  Comparison: 09/21/2011.   Findings: There is slight cardiac silhouette enlargement.  Mediastinal and hilar contours appear stable.  No peripheral pulmonary infiltrates are identified.  No consolidation or pleural effusion is seen. Minimal central peribronchial thickening is present. Bones appear average for age.  IMPRESSION: Minimal central peribronchial thickening.  No peripheral infiltrates, consolidation, or pleural effusion.   Original Report Authenticated By: Onalee Hua Call   No diagnosis found. Pulse oximetry 99% room air normal I have seen and evaluated the chest x-ray.  No focal opacification concerning for pneumonia, but it does appear as though bronchitis maybe likely.    MDM  Patient presents with 2 days of URI like symptoms.  On exam she is awake and alert, afebrile.  X-ray suggests bronchitis.  Absent fever, distress, low suspicion for occult or atypical pneumonia currently.  Patient discharged with steroids, return precautions.  Gerhard Munch, MD 09/30/12 1022

## 2012-12-16 ENCOUNTER — Encounter: Payer: Self-pay | Admitting: Family Medicine

## 2012-12-16 ENCOUNTER — Ambulatory Visit (INDEPENDENT_AMBULATORY_CARE_PROVIDER_SITE_OTHER): Payer: 59 | Admitting: Family Medicine

## 2012-12-16 VITALS — BP 148/110 | HR 72 | Temp 97.8°F | Ht 67.0 in | Wt 325.0 lb

## 2012-12-16 DIAGNOSIS — E785 Hyperlipidemia, unspecified: Secondary | ICD-10-CM

## 2012-12-16 DIAGNOSIS — R739 Hyperglycemia, unspecified: Secondary | ICD-10-CM | POA: Insufficient documentation

## 2012-12-16 DIAGNOSIS — I10 Essential (primary) hypertension: Secondary | ICD-10-CM

## 2012-12-16 DIAGNOSIS — E559 Vitamin D deficiency, unspecified: Secondary | ICD-10-CM

## 2012-12-16 DIAGNOSIS — E119 Type 2 diabetes mellitus without complications: Secondary | ICD-10-CM

## 2012-12-16 DIAGNOSIS — Z0000 Encounter for general adult medical examination without abnormal findings: Secondary | ICD-10-CM

## 2012-12-16 DIAGNOSIS — E079 Disorder of thyroid, unspecified: Secondary | ICD-10-CM

## 2012-12-16 DIAGNOSIS — E049 Nontoxic goiter, unspecified: Secondary | ICD-10-CM | POA: Insufficient documentation

## 2012-12-16 DIAGNOSIS — Z23 Encounter for immunization: Secondary | ICD-10-CM

## 2012-12-16 LAB — CBC
HCT: 39 % (ref 36.0–46.0)
Hemoglobin: 12.7 g/dL (ref 12.0–15.0)
MCH: 24.7 pg — ABNORMAL LOW (ref 26.0–34.0)
MCHC: 32.6 g/dL (ref 30.0–36.0)
RDW: 15.4 % (ref 11.5–15.5)
WBC: 6.2 10*3/uL (ref 4.0–10.5)

## 2012-12-16 MED ORDER — NEBIVOLOL HCL 20 MG PO TABS: 20.0000 mg | ORAL_TABLET | Freq: Every day | ORAL | Status: DC

## 2012-12-16 NOTE — Patient Instructions (Signed)
[employer] probiotics dailyPreventive Care for Adults, Female A healthy lifestyle and preventive care can promote health and wellness. Preventive health guidelines for women include the following key practices.  A routine yearly physical is a good way to check with your caregiver about your health and preventive screening. It is a chance to share any concerns and updates on your health, and to receive a thorough exam.  Visit your dentist for a routine exam and preventive care every 6 months. Brush your teeth twice a day and floss once a day. Good oral hygiene prevents tooth decay and gum disease.  The frequency of eye exams is based on your age, health, family medical history, use of contact lenses, and other factors. Follow your caregiver's recommendations for frequency of eye exams.  Eat a healthy diet. Foods like vegetables, fruits, whole grains, low-fat dairy products, and lean protein foods contain the nutrients you need without too many calories. Decrease your intake of foods high in solid fats, added sugars, and salt. Eat the right amount of calories for you.Get information about a proper diet from your caregiver, if necessary.  Regular physical exercise is one of the most important things you can do for your health. Most adults should get at least 150 minutes of moderate-intensity exercise (any activity that increases your heart rate and causes you to sweat) each week. In addition, most adults need muscle-strengthening exercises on 2 or more days a week.  Maintain a healthy weight. The body mass index (BMI) is a screening tool to identify possible weight problems. It provides an estimate of body fat based on height and weight. Your caregiver can help determine your BMI, and can help you achieve or maintain a healthy weight.For adults 20 years and older:  A BMI below 18.5 is considered underweight.  A BMI of 18.5 to 24.9 is normal.  A BMI of 25 to 29.9 is considered  overweight.  A BMI of 30 and above is considered obese.  Maintain normal blood lipids and cholesterol levels by exercising and minimizing your intake of saturated fat. Eat a balanced diet with plenty of fruit and vegetables. Blood tests for lipids and cholesterol should begin at age 24 and be repeated every 5 years. If your lipid or cholesterol levels are high, you are over 50, or you are at high risk for heart disease, you may need your cholesterol levels checked more frequently.Ongoing high lipid and cholesterol levels should be treated with medicines if diet and exercise are not effective.  If you smoke, find out from your caregiver how to quit. If you do not use tobacco, do not start.  If you are pregnant, do not drink alcohol. If you are breastfeeding, be very cautious about drinking alcohol. If you are not pregnant and choose to drink alcohol, do not exceed 1 drink per day. One drink is considered to be 12 ounces (355 mL) of beer, 5 ounces (148 mL) of wine, or 1.5 ounces (44 mL) of liquor.  Avoid use of street drugs. Do not share needles with anyone. Ask for help if you need support or instructions about stopping the use of drugs.  High blood pressure causes heart disease and increases the risk of stroke. Your blood pressure should be checked at least every 1 to 2 years. Ongoing high blood pressure should be treated with medicines if weight loss and exercise are not effective.  If you are 59 to 46 years old, ask your caregiver if you should take aspirin to prevent  strokes.  Diabetes screening involves taking a blood sample to check your fasting blood sugar level. This should be done once every 3 years, after age 8, if you are within normal weight and without risk factors for diabetes. Testing should be considered at a younger age or be carried out more frequently if you are overweight and have at least 1 risk factor for diabetes.  Breast cancer screening is essential preventive care for  women. You should practice "breast self-awareness." This means understanding the normal appearance and feel of your breasts and may include breast self-examination. Any changes detected, no matter how small, should be reported to a caregiver. Women in their 80s and 30s should have a clinical breast exam (CBE) by a caregiver as part of a regular health exam every 1 to 3 years. After age 61, women should have a CBE every year. Starting at age 62, women should consider having a mammography (breast X-ray test) every year. Women who have a family history of breast cancer should talk to their caregiver about genetic screening. Women at a high risk of breast cancer should talk to their caregivers about having magnetic resonance imaging (MRI) and a mammography every year.  The Pap test is a screening test for cervical cancer. A Pap test can show cell changes on the cervix that might become cervical cancer if left untreated. A Pap test is a procedure in which cells are obtained and examined from the lower end of the uterus (cervix).  Women should have a Pap test starting at age 86.  Between ages 61 and 38, Pap tests should be repeated every 2 years.  Beginning at age 26, you should have a Pap test every 3 years as long as the past 3 Pap tests have been normal.  Some women have medical problems that increase the chance of getting cervical cancer. Talk to your caregiver about these problems. It is especially important to talk to your caregiver if a new problem develops soon after your last Pap test. In these cases, your caregiver may recommend more frequent screening and Pap tests.  The above recommendations are the same for women who have or have not gotten the vaccine for human papillomavirus (HPV).  If you had a hysterectomy for a problem that was not cancer or a condition that could lead to cancer, then you no longer need Pap tests. Even if you no longer need a Pap test, a regular exam is a good idea to make  sure no other problems are starting.  If you are between ages 55 and 47, and you have had normal Pap tests going back 10 years, you no longer need Pap tests. Even if you no longer need a Pap test, a regular exam is a good idea to make sure no other problems are starting.  If you have had past treatment for cervical cancer or a condition that could lead to cancer, you need Pap tests and screening for cancer for at least 20 years after your treatment.  If Pap tests have been discontinued, risk factors (such as a new sexual partner) need to be reassessed to determine if screening should be resumed.  The HPV test is an additional test that may be used for cervical cancer screening. The HPV test looks for the virus that can cause the cell changes on the cervix. The cells collected during the Pap test can be tested for HPV. The HPV test could be used to screen women aged 30 years and  older, and should be used in women of any age who have unclear Pap test results. After the age of 33, women should have HPV testing at the same frequency as a Pap test.  Colorectal cancer can be detected and often prevented. Most routine colorectal cancer screening begins at the age of 63 and continues through age 28. However, your caregiver may recommend screening at an earlier age if you have risk factors for colon cancer. On a yearly basis, your caregiver may provide home test kits to check for hidden blood in the stool. Use of a small camera at the end of a tube, to directly examine the colon (sigmoidoscopy or colonoscopy), can detect the earliest forms of colorectal cancer. Talk to your caregiver about this at age 28, when routine screening begins. Direct examination of the colon should be repeated every 5 to 10 years through age 24, unless early forms of pre-cancerous polyps or small growths are found.  Hepatitis C blood testing is recommended for all people born from [birth year] through 1965 and any individual with known risks  for hepatitis C.  Practice safe sex. Use condoms and avoid high-risk sexual practices to reduce the spread of sexually transmitted infections (STIs). STIs include gonorrhea, chlamydia, syphilis, trichomonas, herpes, HPV, and human immunodeficiency virus (HIV). Herpes, HIV, and HPV are viral illnesses that have no cure. They can result in disability, cancer, and death. Sexually active women aged 1 and younger should be checked for chlamydia. Older women with new or multiple partners should also be tested for chlamydia. Testing for other STIs is recommended if you are sexually active and at increased risk.  Osteoporosis is a disease in which the bones lose minerals and strength with aging. This can result in serious bone fractures. The risk of osteoporosis can be identified using a bone density scan. Women ages 88 and over and women at risk for fractures or osteoporosis should discuss screening with their caregivers. Ask your caregiver whether you should take a calcium supplement or vitamin D to reduce the rate of osteoporosis.  Menopause can be associated with physical symptoms and risks. Hormone replacement therapy is available to decrease symptoms and risks. You should talk to your caregiver about whether hormone replacement therapy is right for you.  Use sunscreen with sun protection factor (SPF) of 30 or more. Apply sunscreen liberally and repeatedly throughout the day. You should seek shade when your shadow is shorter than you. Protect yourself by wearing long sleeves, pants, a wide-brimmed hat, and sunglasses year round, whenever you are outdoors.  Once a month, do a whole body skin exam, using a mirror to look at the skin on your back. Notify your caregiver of new moles, moles that have irregular borders, moles that are larger than a pencil eraser, or moles that have changed in shape or color.  Stay current with required immunizations.  Influenza. You need a dose every fall (or winter). The  composition of the flu vaccine changes each year, so being vaccinated once is not enough.  Pneumococcal polysaccharide. You need 1 to 2 doses if you smoke cigarettes or if you have certain chronic medical conditions. You need 1 dose at age 78 (or older) if you have never been vaccinated.  Tetanus, diphtheria, pertussis (Tdap, Td). Get 1 dose of Tdap vaccine if you are younger than age 16, are over 75 and have contact with an infant, are a Research scientist (physical sciences), are pregnant, or simply want to be protected from whooping cough. After that, you  need a Td booster dose every 10 years. Consult your caregiver if you have not had at least 3 tetanus and diphtheria-containing shots sometime in your life or have a deep or dirty wound.  HPV. You need this vaccine if you are a woman age 51 or younger. The vaccine is given in 3 doses over 6 months.  Measles, mumps, rubella (MMR). You need at least 1 dose of MMR if you were born in 1957 or later. You may also need a second dose.  Meningococcal. If you are age 43 to 46 and a first-year college student living in a residence hall, or have one of several medical conditions, you need to get vaccinated against meningococcal disease. You may also need additional booster doses.  Zoster (shingles). If you are age 60 or older, you should get this vaccine.  Varicella (chickenpox). If you have never had chickenpox or you were vaccinated but received only 1 dose, talk to your caregiver to find out if you need this vaccine.  Hepatitis A. You need this vaccine if you have a specific risk factor for hepatitis A virus infection or you simply wish to be protected from this disease. The vaccine is usually given as 2 doses, 6 to 18 months apart.  Hepatitis B. You need this vaccine if you have a specific risk factor for hepatitis B virus infection or you simply wish to be protected from this disease. The vaccine is given in 3 doses, usually over 6 months. Preventive Services /  Frequency Ages 47 to 31  Blood pressure check.** / Every 1 to 2 years.  Lipid and cholesterol check.** / Every 5 years beginning at age 65.  Clinical breast exam.** / Every 3 years for women in their 1s and 30s.  Pap test.** / Every 2 years from ages 75 through 21. Every 3 years starting at age 92 through age 30 or 57 with a history of 3 consecutive normal Pap tests.  HPV screening.** / Every 3 years from ages 48 through ages 50 to 107 with a history of 3 consecutive normal Pap tests.  Hepatitis C blood test.** / For any individual with known risks for hepatitis C.  Skin self-exam. / Monthly.  Influenza immunization.** / Every year.  Pneumococcal polysaccharide immunization.** / 1 to 2 doses if you smoke cigarettes or if you have certain chronic medical conditions.  Tetanus, diphtheria, pertussis (Tdap, Td) immunization. / A one-time dose of Tdap vaccine. After that, you need a Td booster dose every 10 years.  HPV immunization. / 3 doses over 6 months, if you are 56 and younger.  Measles, mumps, rubella (MMR) immunization. / You need at least 1 dose of MMR if you were born in 1957 or later. You may also need a second dose.  Meningococcal immunization. / 1 dose if you are age 68 to 73 and a first-year college student living in a residence hall, or have one of several medical conditions, you need to get vaccinated against meningococcal disease. You may also need additional booster doses.  Varicella immunization.** / Consult your caregiver.  Hepatitis A immunization.** / Consult your caregiver. 2 doses, 6 to 18 months apart.  Hepatitis B immunization.** / Consult your caregiver. 3 doses usually over 6 months. Ages 88 to 54  Blood pressure check.** / Every 1 to 2 years.  Lipid and cholesterol check.** / Every 5 years beginning at age 8.  Clinical breast exam.** / Every year after age 38.  Mammogram.** / Every year beginning  at age 23 and continuing for as long as you are in  good health. Consult with your caregiver.  Pap test.** / Every 3 years starting at age 67 through age 70 or 77 with a history of 3 consecutive normal Pap tests.  HPV screening.** / Every 3 years from ages 37 through ages 2 to 63 with a history of 3 consecutive normal Pap tests.  Fecal occult blood test (FOBT) of stool. / Every year beginning at age 48 and continuing until age 27. You may not need to do this test if you get a colonoscopy every 10 years.  Flexible sigmoidoscopy or colonoscopy.** / Every 5 years for a flexible sigmoidoscopy or every 10 years for a colonoscopy beginning at age 54 and continuing until age 43.  Hepatitis C blood test.** / For all people born from [birth year] through 1964 and any individual with known risks for hepatitis C.  Skin self-exam. / Monthly.  Influenza immunization.** / Every year.  Pneumococcal polysaccharide immunization.** / 1 to 2 doses if you smoke cigarettes or if you have certain chronic medical conditions.  Tetanus, diphtheria, pertussis (Tdap, Td) immunization.** / A one-time dose of Tdap vaccine. After that, you need a Td booster dose every 10 years.  Measles, mumps, rubella (MMR) immunization. / You need at least 1 dose of MMR if you were born in 1957 or later. You may also need a second dose.  Varicella immunization.** / Consult your caregiver.  Meningococcal immunization.** / Consult your caregiver.  Hepatitis A immunization.** / Consult your caregiver. 2 doses, 6 to 18 months apart.  Hepatitis B immunization.** / Consult your caregiver. 3 doses, usually over 6 months. Ages 23 and over  Blood pressure check.** / Every 1 to 2 years.  Lipid and cholesterol check.** / Every 5 years beginning at age 39.  Clinical breast exam.** / Every year after age 23.  Mammogram.** / Every year beginning at age 51 and continuing for as long as you are in good health. Consult with your caregiver.  Pap test.** / Every 3 years starting at age 52 through  age 60 or 74 with a 3 consecutive normal Pap tests. Testing can be stopped between 65 and 70 with 3 consecutive normal Pap tests and no abnormal Pap or HPV tests in the past 10 years.  HPV screening.** / Every 3 years from ages 27 through ages 26 or 39 with a history of 3 consecutive normal Pap tests. Testing can be stopped between 65 and 70 with 3 consecutive normal Pap tests and no abnormal Pap or HPV tests in the past 10 years.  Fecal occult blood test (FOBT) of stool. / Every year beginning at age 81 and continuing until age 32. You may not need to do this test if you get a colonoscopy every 10 years.  Flexible sigmoidoscopy or colonoscopy.** / Every 5 years for a flexible sigmoidoscopy or every 10 years for a colonoscopy beginning at age 40 and continuing until age 68.  Hepatitis C blood test.** / For all people born from [birth year] through 1964 and any individual with known risks for hepatitis C.  Osteoporosis screening.** / A one-time screening for women ages 77 and over and women at risk for fractures or osteoporosis.  Skin self-exam. / Monthly.  Influenza immunization.** / Every year.  Pneumococcal polysaccharide immunization.** / 1 dose at age 97 (or older) if you have never been vaccinated.  Tetanus, diphtheria, pertussis (Tdap, Td) immunization. / A one-time dose of Tdap vaccine if  you are over 65 and have contact with an infant, are a Research scientist (physical sciences), or simply want to be protected from whooping cough. After that, you need a Td booster dose every 10 years.  Varicella immunization.** / Consult your caregiver.  Meningococcal immunization.** / Consult your caregiver.  Hepatitis A immunization.** / Consult your caregiver. 2 doses, 6 to 18 months apart.  Hepatitis B immunization.** / Check with your caregiver. 3 doses, usually over 6 months. ** Family history and personal history of risk and conditions may change your caregiver's recommendations. Document Released: 04/15/2001 Document  Revised: 05/12/2011 Document Reviewed: 07/15/2010 [facility] Patient Information 2014 [place], [place].

## 2012-12-17 LAB — RENAL FUNCTION PANEL
Albumin: 4.1 g/dL (ref 3.5–5.2)
BUN: 14 mg/dL (ref 6–23)
CO2: 31 mEq/L (ref 19–32)
Creat: 0.84 mg/dL (ref 0.50–1.10)
Glucose, Bld: 87 mg/dL (ref 70–99)
Phosphorus: 3.4 mg/dL (ref 2.3–4.6)
Potassium: 3.9 mEq/L (ref 3.5–5.3)
Sodium: 138 mEq/L (ref 135–145)

## 2012-12-17 LAB — TSH: TSH: 2.531 u[IU]/mL (ref 0.350–4.500)

## 2012-12-17 LAB — HEPATIC FUNCTION PANEL
ALT: 9 U/L (ref 0–35)
AST: 15 U/L (ref 0–37)
Bilirubin, Direct: 0.1 mg/dL (ref 0.0–0.3)
Total Protein: 7.4 g/dL (ref 6.0–8.3)

## 2012-12-17 LAB — HEMOGLOBIN A1C
Hgb A1c MFr Bld: 6.6 % — ABNORMAL HIGH (ref <5.7)
Mean Plasma Glucose: 143 mg/dL — ABNORMAL HIGH (ref <117)

## 2012-12-17 LAB — LIPID PANEL
Cholesterol: 210 mg/dL — ABNORMAL HIGH (ref 0–200)
Total CHOL/HDL Ratio: 4.1 Ratio

## 2012-12-17 LAB — VITAMIN D 25 HYDROXY (VIT D DEFICIENCY, FRACTURES): Vit D, 25-Hydroxy: 25 ng/mL — ABNORMAL LOW (ref 30–89)

## 2012-12-18 ENCOUNTER — Encounter: Payer: Self-pay | Admitting: Family Medicine

## 2012-12-18 DIAGNOSIS — Z0000 Encounter for general adult medical examination without abnormal findings: Secondary | ICD-10-CM | POA: Insufficient documentation

## 2012-12-18 HISTORY — DX: Encounter for general adult medical examination without abnormal findings: Z00.00

## 2012-12-18 NOTE — Assessment & Plan Note (Signed)
Continuous HCT and increase this to 20 mg daily

## 2012-12-18 NOTE — Assessment & Plan Note (Signed)
>>  ASSESSMENT AND PLAN FOR HYPERGLYCEMIA WRITTEN ON 12/18/2012  7:25 PM BY BLYTH, STACEY A, MD  The patient denies previous history of it does note sugars have been high in the past. Is encouraged to minimize simple carbs and attempt to follow the DASH diet. Increase exercise and return for prescription of glucometer and referral to nutritionist

## 2012-12-18 NOTE — Assessment & Plan Note (Signed)
TSH within normal limits today

## 2012-12-18 NOTE — Assessment & Plan Note (Signed)
Given flu shot and Tdap today, requested old records will return soon for annual gyn exam to review annual labs

## 2012-12-18 NOTE — Assessment & Plan Note (Signed)
Mildly low encouraged vitamin D 2000 international units daily.

## 2012-12-18 NOTE — Progress Notes (Signed)
Patient ID: Jennifer Rich, female   DOB: 22-Jan-1966, 47 y.o.   MRN: [id number] Jennifer Rich [id number] 05-07-1965 12/18/2012      Progress Note-Follow Up  Subjective  Chief Complaint  Chief Complaint  Patient presents with  . Establish Care    new patient  . Injections    flu and tdap    HPI  Patient is a 47 year old African American female who is in today to establish care. Had a pneumonia about 6 months ago but is feeling somewhat better at this time. No fevers or chills. No chest pain or palpitations, shortness of breath. She technologist she has not been exercising or following a heart healthy diet. He was taking medications as prescribed.  Past Medical History  Diagnosis Date  . Hypertension   . Pneumonia   . Chicken pox as a child  . Hyperlipidemia   . Vitamin D deficiency   . Thyroid disease   . Allergy   . Hyperglycemia   . Preventative health care 12/18/2012    Has seen Dr Neva Seat for GYN in past  . Type II or unspecified type diabetes mellitus without mention of complication, not stated as uncontrolled     Past Surgical History  Procedure Laterality Date  . None    . Wisdom tooth extraction  47 yrs old    Family History  Problem Relation Age of Onset  . Heart attack Maternal Grandfather 75  . Heart disease Maternal Grandfather   . Diabetes Maternal Grandmother     type 2  . Blindness Maternal Grandmother   . Hypertension Paternal Grandmother   . Stroke Paternal Grandfather   . Hypertension Sister   . Hypertension Sister     History   Social History  . Marital Status: Married    Spouse Name: N/A    Number of Children: 0  . Years of Education: N/A   Occupational History  . SALES [employer]    Social History Main Topics  . Smoking status: Never Smoker   . Smokeless tobacco: Never Used  . Alcohol Use: No     Comment: No Pork  . Drug Use: No  . Sexual Activity: Yes    Partners: Male    Birth Control/ Protection: None      Comment: lives with husband and [place], [place]   Other Topics Concern  . Not on file   Social History Narrative  . No narrative on file    Current Outpatient Prescriptions on File Prior to Visit  Medication Sig Dispense Refill  . acetaminophen (TYLENOL) 500 MG tablet Take 1,000 mg by mouth every 6 (six) hours as needed.      Marland Kitchen losartan-hydrochlorothiazide (HYZAAR) 100-25 MG per tablet Take 1 tablet by mouth daily.       No current facility-administered medications on file prior to visit.    No Known Allergies  Review of Systems  Review of Systems  Constitutional: Negative for fever and malaise/fatigue.  HENT: Negative for congestion.   Eyes: Negative for discharge.  Respiratory: Negative for shortness of breath.   Cardiovascular: Negative for chest pain, palpitations and leg swelling.  Gastrointestinal: Negative for nausea, abdominal pain and diarrhea.  Genitourinary: Negative for dysuria.  Musculoskeletal: Negative for falls.  Skin: Negative for rash.  Neurological: Negative for loss of consciousness and headaches.  Endo/Heme/Allergies: Negative for polydipsia.  Psychiatric/Behavioral: Negative for depression and suicidal ideas. The patient is not nervous/anxious and does not have insomnia.     Objective  BP  148/110  Pulse 72  Temp(Src) 97.8 F (36.6 C) (Oral)  Ht 5\' 7"  (1.702 m)  Wt 325 lb 0.6 oz (147.437 kg)  BMI 50.9 kg/m2  SpO2 97%  LMP 11/25/2012  Physical Exam  Physical Exam  Constitutional: She is oriented to person, place, and time and well-developed, well-nourished, and in no distress. No distress.  HENT:  Head: Normocephalic and atraumatic.  Right Ear: External ear normal.  Left Ear: External ear normal.  Nose: Nose normal.  Mouth/Throat: Oropharynx is clear and moist. No oropharyngeal exudate.  Eyes: Conjunctivae are normal. Pupils are equal, round, and reactive to light. Right eye exhibits no discharge. Left eye exhibits no discharge. No  scleral icterus.  Neck: Normal range of motion. Neck supple. No thyromegaly present.  Cardiovascular: Normal rate, regular rhythm, normal heart sounds and intact distal pulses.   No murmur heard. Pulmonary/Chest: Effort normal and breath sounds normal. No respiratory distress. She has no wheezes. She has no rales.  Abdominal: Soft. Bowel sounds are normal. She exhibits no distension and no mass. There is no tenderness.  Musculoskeletal: Normal range of motion. She exhibits no edema and no tenderness.  Lymphadenopathy:    She has no cervical adenopathy.  Neurological: She is alert and oriented to person, place, and time. She has normal reflexes. No cranial nerve deficit. Coordination normal.  Skin: Skin is warm and dry. No rash noted. She is not diaphoretic.  Psychiatric: Mood, memory and affect normal.    Lab Results  Component Value Date   TSH 2.531 12/16/2012   Lab Results  Component Value Date   WBC 6.2 12/16/2012   HGB 12.7 12/16/2012   HCT 39.0 12/16/2012   MCV 75.9* 12/16/2012   PLT 227 12/16/2012   Lab Results  Component Value Date   CREATININE 0.84 12/16/2012   BUN 14 12/16/2012   NA 138 12/16/2012   K 3.9 12/16/2012   CL 101 12/16/2012   CO2 31 12/16/2012   Lab Results  Component Value Date   ALT 9 12/16/2012   AST 15 12/16/2012   ALKPHOS 78 12/16/2012   BILITOT 0.7 12/16/2012   Lab Results  Component Value Date   CHOL 210* 12/16/2012   Lab Results  Component Value Date   HDL 51 12/16/2012   Lab Results  Component Value Date   LDLCALC 133* 12/16/2012   Lab Results  Component Value Date   TRIG 132 12/16/2012   Lab Results  Component Value Date   CHOLHDL 4.1 12/16/2012     Assessment & Plan  Preventative health care Given flu shot and Tdap today, requested old records will return soon for annual gyn exam to review annual labs  Vitamin D deficiency Mildly low encouraged vitamin D 2000 international units daily.  HTN  (hypertension) Continuous HCT and increase this to 20 mg daily  Type II or unspecified type diabetes mellitus without mention of complication, not stated as uncontrolled The patient denies previous history of it does note sugars have been high in the past. Is encouraged to minimize simple carbs and attempt to follow the DASH diet. Increase exercise and return for prescription of glucometer and referral to nutritionist  Thyroid disease TSH within normal limits today  Hyperlipidemia Elevated. Avoid transplants. Add Krill oil. Minimize simple carbs and saturated fats.

## 2012-12-18 NOTE — Assessment & Plan Note (Signed)
The patient denies previous history of it does note sugars have been high in the past. Is encouraged to minimize simple carbs and attempt to follow the DASH diet. Increase exercise and return for prescription of glucometer and referral to nutritionist

## 2012-12-18 NOTE — Assessment & Plan Note (Signed)
Elevated. Avoid transplants. Add Krill oil. Minimize simple carbs and saturated fats.

## 2012-12-21 ENCOUNTER — Other Ambulatory Visit: Payer: Self-pay

## 2012-12-21 DIAGNOSIS — Z1231 Encounter for screening mammogram for malignant neoplasm of breast: Secondary | ICD-10-CM

## 2013-01-18 ENCOUNTER — Ambulatory Visit: Payer: 59

## 2013-01-21 ENCOUNTER — Ambulatory Visit
Admission: RE | Admit: 2013-01-21 | Discharge: 2013-01-21 | Disposition: A | Discharge status: Home / Self Care | Payer: 59 | Source: Ambulatory Visit

## 2013-01-21 DIAGNOSIS — Z1231 Encounter for screening mammogram for malignant neoplasm of breast: Secondary | ICD-10-CM

## 2013-01-25 ENCOUNTER — Telehealth: Payer: Self-pay

## 2013-01-25 ENCOUNTER — Encounter: Payer: Self-pay | Admitting: Family Medicine

## 2013-01-25 ENCOUNTER — Other Ambulatory Visit (HOSPITAL_COMMUNITY)
Admission: RE | Admit: 2013-01-25 | Discharge: 2013-01-25 | Disposition: A | Discharge status: Home / Self Care | Payer: 59 | Source: Ambulatory Visit | Attending: Family Medicine | Admitting: Family Medicine

## 2013-01-25 ENCOUNTER — Ambulatory Visit (INDEPENDENT_AMBULATORY_CARE_PROVIDER_SITE_OTHER): Payer: 59 | Admitting: Family Medicine

## 2013-01-25 ENCOUNTER — Other Ambulatory Visit: Payer: Self-pay | Admitting: Family Medicine

## 2013-01-25 VITALS — BP 142/90 | HR 87 | Temp 97.8°F | Ht 67.0 in | Wt 325.0 lb

## 2013-01-25 DIAGNOSIS — R109 Unspecified abdominal pain: Secondary | ICD-10-CM

## 2013-01-25 DIAGNOSIS — Z124 Encounter for screening for malignant neoplasm of cervix: Secondary | ICD-10-CM

## 2013-01-25 DIAGNOSIS — F411 Generalized anxiety disorder: Secondary | ICD-10-CM

## 2013-01-25 DIAGNOSIS — N760 Acute vaginitis: Secondary | ICD-10-CM | POA: Insufficient documentation

## 2013-01-25 DIAGNOSIS — I10 Essential (primary) hypertension: Secondary | ICD-10-CM

## 2013-01-25 DIAGNOSIS — E785 Hyperlipidemia, unspecified: Secondary | ICD-10-CM

## 2013-01-25 DIAGNOSIS — E119 Type 2 diabetes mellitus without complications: Secondary | ICD-10-CM

## 2013-01-25 DIAGNOSIS — F329 Major depressive disorder, single episode, unspecified: Secondary | ICD-10-CM

## 2013-01-25 DIAGNOSIS — E559 Vitamin D deficiency, unspecified: Secondary | ICD-10-CM

## 2013-01-25 DIAGNOSIS — Z0000 Encounter for general adult medical examination without abnormal findings: Secondary | ICD-10-CM

## 2013-01-25 DIAGNOSIS — Z01419 Encounter for gynecological examination (general) (routine) without abnormal findings: Secondary | ICD-10-CM | POA: Insufficient documentation

## 2013-01-25 DIAGNOSIS — F341 Dysthymic disorder: Secondary | ICD-10-CM

## 2013-01-25 DIAGNOSIS — F32A Depression, unspecified: Secondary | ICD-10-CM

## 2013-01-25 HISTORY — DX: Encounter for screening for malignant neoplasm of cervix: Z12.4

## 2013-01-25 MED ORDER — ESCITALOPRAM OXALATE 10 MG PO TABS: 10.0000 mg | ORAL_TABLET | Freq: Every day | ORAL | Status: DC

## 2013-01-25 MED ORDER — ALPRAZOLAM 0.25 MG PO TABS: 0.2500 mg | ORAL_TABLET | Freq: Two times a day (BID) | ORAL | Status: DC | PRN

## 2013-01-25 NOTE — Telephone Encounter (Signed)
Pt needs a note for work stating she has been here

## 2013-01-25 NOTE — Telephone Encounter (Signed)
OK to give her a note for work for tomorrow

## 2013-01-25 NOTE — Progress Notes (Signed)
Pre visit review using our clinic review tool, if applicable. No additional management support is needed unless otherwise documented below in the visit note. 

## 2013-01-25 NOTE — Patient Instructions (Signed)

## 2013-01-25 NOTE — Telephone Encounter (Signed)
Pt would would also like a note to be excused from work tomorrow 11/26, afraid to go with new meds

## 2013-01-26 ENCOUNTER — Ambulatory Visit (HOSPITAL_BASED_OUTPATIENT_CLINIC_OR_DEPARTMENT_OTHER): Payer: 59

## 2013-01-26 LAB — HIV ANTIBODY (ROUTINE TESTING W REFLEX): HIV: NONREACTIVE

## 2013-01-26 LAB — RPR

## 2013-01-26 NOTE — Telephone Encounter (Signed)
Notified pt and she requests that we fax letter to [phone]. Letter competed and faxed. Pt aware.

## 2013-01-28 ENCOUNTER — Ambulatory Visit (HOSPITAL_BASED_OUTPATIENT_CLINIC_OR_DEPARTMENT_OTHER): Payer: 59

## 2013-01-30 ENCOUNTER — Encounter: Payer: Self-pay | Admitting: Family Medicine

## 2013-01-30 DIAGNOSIS — F329 Major depressive disorder, single episode, unspecified: Secondary | ICD-10-CM | POA: Insufficient documentation

## 2013-01-30 DIAGNOSIS — F32A Depression, unspecified: Secondary | ICD-10-CM

## 2013-01-30 DIAGNOSIS — F419 Anxiety disorder, unspecified: Secondary | ICD-10-CM

## 2013-01-30 HISTORY — DX: Anxiety disorder, unspecified: F41.9

## 2013-01-30 HISTORY — DX: Depression, unspecified: F32.A

## 2013-01-30 NOTE — Assessment & Plan Note (Addendum)
Minimize simple carbs and increase exercise. HGBA1C 6.6

## 2013-01-30 NOTE — Assessment & Plan Note (Signed)
Encouraged heart healthy diet and regular exercise 

## 2013-01-30 NOTE — Assessment & Plan Note (Signed)
Avoid trans fats, add a krill oil cap

## 2013-01-30 NOTE — Progress Notes (Signed)
Patient ID: Jennifer Rich, female   DOB: 01/05/1966, 47 y.o.   MRN: [id number] RASA DEGRAZIA [id number] [birth year] 01/30/2013      Progress Note New Patient  Subjective  Chief Complaint  Chief Complaint  Patient presents with  . Gynecologic Exam    pap    HPI  Is struggling with a lot of anxiety and low mood lately but no suicidal ideation. Is requesting referral to psychiatry. Is ready to start medications to help deal with her anhedonia and high level of stress. No other acute complaints except for just. Anemia. Has a pressure deep in her abdomen with intercourse. No GI or GU complaints. No chest pain, palpitations or shortness of breath. Taking medications as prescribed  Past Medical History  Diagnosis Date  . Hypertension   . Pneumonia   . Chicken pox as a child  . Hyperlipidemia   . Vitamin D deficiency   . Thyroid disease   . Allergy   . Hyperglycemia   . Preventative health care 12/18/2012    Has seen Dr Neva Seat for GYN in past  . Type II or unspecified type diabetes mellitus without mention of complication, not stated as uncontrolled   . Cervical cancer screening 01/25/2013  . Anxiety and depression 01/30/2013    Past Surgical History  Procedure Laterality Date  . None    . Wisdom tooth extraction  47 yrs old    Family History  Problem Relation Age of Onset  . Heart attack Maternal Grandfather 75  . Heart disease Maternal Grandfather   . Diabetes Maternal Grandmother     type 2  . Blindness Maternal Grandmother   . Hypertension Paternal Grandmother   . Stroke Paternal Grandfather   . Hypertension Sister   . Hypertension Sister     History   Social History  . Marital Status: Married    Spouse Name: N/A    Number of Children: 0  . Years of Education: N/A   Occupational History  . SALES [employer]    Social History Main Topics  . Smoking status: Never Smoker   . Smokeless tobacco: Never Used  . Alcohol Use: No     Comment: No Pork   . Drug Use: No  . Sexual Activity: Yes    Partners: Male    Birth Control/ Protection: None     Comment: lives with husband and [place], [place]   Other Topics Concern  . Not on file   Social History Narrative  . No narrative on file    Current Outpatient Prescriptions on File Prior to Visit  Medication Sig Dispense Refill  . acetaminophen (TYLENOL) 500 MG tablet Take 1,000 mg by mouth every 6 (six) hours as needed.      Marland Kitchen losartan-hydrochlorothiazide (HYZAAR) 100-25 MG per tablet Take 1 tablet by mouth daily.      . Nebivolol HCl (BYSTOLIC) 20 MG TABS Take 1 tablet (20 mg total) by mouth daily.  30 tablet  3   No current facility-administered medications on file prior to visit.    No Known Allergies  Review of Systems  Review of Systems  Constitutional: Negative for fever and malaise/fatigue.  HENT: Negative for congestion.   Eyes: Negative for discharge.  Respiratory: Negative for shortness of breath.   Cardiovascular: Negative for chest pain, palpitations and leg swelling.  Gastrointestinal: Positive for heartburn and abdominal pain. Negative for nausea and diarrhea.  Genitourinary: Negative for dysuria.  Musculoskeletal: Negative for falls.  Skin: Negative for rash.  Neurological: Negative for loss of consciousness and headaches.  Endo/Heme/Allergies: Negative for polydipsia.  Psychiatric/Behavioral: Positive for depression. Negative for suicidal ideas. The patient is nervous/anxious. The patient does not have insomnia.     Objective  BP 142/90  Pulse 87  Temp(Src) 97.8 F (36.6 C) (Oral)  Ht 5\' 7"  (1.702 m)  Wt 325 lb (147.419 kg)  BMI 50.89 kg/m2  SpO2 95%  LMP 12/26/2012  Physical Exam  Physical Exam  Constitutional: She is oriented to person, place, and time and well-developed, well-nourished, and in no distress. No distress.  HENT:  Head: Normocephalic and atraumatic.  Eyes: Conjunctivae are normal.  Neck: Neck supple. No thyromegaly present.   Cardiovascular: Normal rate, regular rhythm and normal heart sounds.   No murmur heard. Pulmonary/Chest: Effort normal and breath sounds normal. She has no wheezes.  Abdominal: Soft. Bowel sounds are normal. She exhibits no distension and no mass. There is tenderness. There is no rebound and no guarding.  Genitourinary: Vagina normal, uterus normal, cervix normal, right adnexa normal and left adnexa normal. No vaginal discharge found.  Musculoskeletal: She exhibits no edema.  Lymphadenopathy:    She has no cervical adenopathy.  Neurological: She is alert and oriented to person, place, and time.  Skin: Skin is warm and dry. No rash noted. She is not diaphoretic.  Psychiatric: Memory, affect and judgment normal.       Assessment & Plan  Cervical cancer screening Pap and testing today, no concerns on exam  Preventative health care Encouraged heart healthy diet and regular exercise  HTN (hypertension) Mild elevation with ongoing stressors no changes today but did encourage DASH diet  Type II or unspecified type diabetes mellitus without mention of complication, not stated as uncontrolled Minimize simple carbs and increase exercise. HGBA1C 6.6  Hyperlipidemia Avoid trans fats, add a krill oil cap  Vitamin D deficiency Add vitamin d 2000 IU daily  Anxiety and depression Started on Lexapro and Xanax and referred to psychaitry

## 2013-01-30 NOTE — Assessment & Plan Note (Signed)
Mild elevation with ongoing stressors no changes today but did encourage DASH diet

## 2013-01-30 NOTE — Assessment & Plan Note (Signed)
Add vitamin d 2000 IU daily

## 2013-01-30 NOTE — Assessment & Plan Note (Signed)
>>  ASSESSMENT AND PLAN FOR HYPERGLYCEMIA WRITTEN ON 01/30/2013 12:46 PM BY BLYTH, STACEY A, MD  Minimize simple carbs and increase exercise. HGBA1C 6.6

## 2013-01-30 NOTE — Assessment & Plan Note (Signed)
Pap and testing today, no concerns on exam

## 2013-01-30 NOTE — Assessment & Plan Note (Signed)
Started on Lexapro and Xanax and referred to psychaitry

## 2013-02-02 ENCOUNTER — Ambulatory Visit (HOSPITAL_BASED_OUTPATIENT_CLINIC_OR_DEPARTMENT_OTHER)
Admission: RE | Admit: 2013-02-02 | Discharge: 2013-02-02 | Disposition: A | Discharge status: Home / Self Care | Payer: 59 | Source: Ambulatory Visit | Attending: Family Medicine | Admitting: Family Medicine

## 2013-02-02 ENCOUNTER — Other Ambulatory Visit: Payer: Self-pay | Admitting: Family Medicine

## 2013-02-02 DIAGNOSIS — R109 Unspecified abdominal pain: Secondary | ICD-10-CM

## 2013-02-02 DIAGNOSIS — N83209 Unspecified ovarian cyst, unspecified side: Secondary | ICD-10-CM | POA: Insufficient documentation

## 2013-02-02 DIAGNOSIS — D259 Leiomyoma of uterus, unspecified: Secondary | ICD-10-CM | POA: Insufficient documentation

## 2013-02-02 DIAGNOSIS — N949 Unspecified condition associated with female genital organs and menstrual cycle: Secondary | ICD-10-CM | POA: Insufficient documentation

## 2013-02-03 NOTE — Progress Notes (Signed)
Quick Note:  Patient Informed and voiced understanding ______ 

## 2013-03-04 ENCOUNTER — Encounter (HOSPITAL_BASED_OUTPATIENT_CLINIC_OR_DEPARTMENT_OTHER): Payer: Self-pay | Admitting: Emergency Medicine

## 2013-03-04 ENCOUNTER — Emergency Department (HOSPITAL_BASED_OUTPATIENT_CLINIC_OR_DEPARTMENT_OTHER)
Admission: EM | Admit: 2013-03-04 | Discharge: 2013-03-04 | Disposition: A | Discharge status: Home / Self Care | Payer: 59 | Attending: Emergency Medicine | Admitting: Emergency Medicine

## 2013-03-04 ENCOUNTER — Emergency Department (HOSPITAL_BASED_OUTPATIENT_CLINIC_OR_DEPARTMENT_OTHER): Payer: 59

## 2013-03-04 DIAGNOSIS — J189 Pneumonia, unspecified organism: Secondary | ICD-10-CM

## 2013-03-04 DIAGNOSIS — F3289 Other specified depressive episodes: Secondary | ICD-10-CM | POA: Insufficient documentation

## 2013-03-04 DIAGNOSIS — I10 Essential (primary) hypertension: Secondary | ICD-10-CM | POA: Insufficient documentation

## 2013-03-04 DIAGNOSIS — E119 Type 2 diabetes mellitus without complications: Secondary | ICD-10-CM | POA: Insufficient documentation

## 2013-03-04 DIAGNOSIS — Z79899 Other long term (current) drug therapy: Secondary | ICD-10-CM | POA: Insufficient documentation

## 2013-03-04 DIAGNOSIS — F329 Major depressive disorder, single episode, unspecified: Secondary | ICD-10-CM | POA: Insufficient documentation

## 2013-03-04 DIAGNOSIS — F411 Generalized anxiety disorder: Secondary | ICD-10-CM | POA: Insufficient documentation

## 2013-03-04 DIAGNOSIS — R062 Wheezing: Secondary | ICD-10-CM | POA: Insufficient documentation

## 2013-03-04 DIAGNOSIS — Z8701 Personal history of pneumonia (recurrent): Secondary | ICD-10-CM | POA: Insufficient documentation

## 2013-03-04 DIAGNOSIS — J159 Unspecified bacterial pneumonia: Secondary | ICD-10-CM | POA: Insufficient documentation

## 2013-03-04 DIAGNOSIS — R0602 Shortness of breath: Secondary | ICD-10-CM | POA: Insufficient documentation

## 2013-03-04 DIAGNOSIS — Z8619 Personal history of other infectious and parasitic diseases: Secondary | ICD-10-CM | POA: Insufficient documentation

## 2013-03-04 MED ORDER — AZITHROMYCIN 250 MG PO TABS
500.0000 mg | ORAL_TABLET | Freq: Once | ORAL | Status: AC
  Administered 2013-03-04: 500 mg via ORAL
  Filled 2013-03-04: qty 2

## 2013-03-04 MED ORDER — IBUPROFEN 800 MG PO TABS
800.0000 mg | ORAL_TABLET | Freq: Once | ORAL | Status: AC
  Administered 2013-03-04: 800 mg via ORAL
  Filled 2013-03-04: qty 1

## 2013-03-04 MED ORDER — ALBUTEROL SULFATE HFA 108 (90 BASE) MCG/ACT IN AERS: 2.0000 | INHALATION_SPRAY | RESPIRATORY_TRACT | Status: DC | PRN

## 2013-03-04 MED ORDER — ALBUTEROL (5 MG/ML) CONTINUOUS INHALATION SOLN
10.0000 mg/h | INHALATION_SOLUTION | Freq: Once | RESPIRATORY_TRACT | Status: AC
  Administered 2013-03-04: 10 mg/h via RESPIRATORY_TRACT
  Filled 2013-03-04: qty 20

## 2013-03-04 MED ORDER — AZITHROMYCIN 250 MG PO TABS: 250.0000 mg | ORAL_TABLET | Freq: Every day | ORAL | Status: DC

## 2013-03-04 NOTE — ED Provider Notes (Signed)
TIME SEEN: 5:58 PM  CHIEF COMPLAINT: Fever, body aches, cough  HPI: Patient is a 48 y.o. female with a history of hypertension, hyperlipidemia who presents emergency department 3 days of fever, body aches and productive cough with brown sputum. She reports she has been wheezing and feeling short of breath with exertion. No chest pain. She's had nausea and diarrhea. She states her grandson has had similar symptoms. No recent international travel. No history of PE or DVT, lower extremity swelling or pain. No prior history of tobacco use, COPD or asthma. She has had influenza vaccine this year. No history of pneumonia vaccination.  ROS: See HPI Constitutional: fever  Eyes: no drainage  ENT: no runny nose   Cardiovascular:  no chest pain  Resp: SOB  GI: no vomiting GU: no dysuria Integumentary: no rash  Allergy: no hives  Musculoskeletal: no leg swelling  Neurological: no slurred speech ROS otherwise negative  PAST MEDICAL HISTORY/PAST SURGICAL HISTORY:  Past Medical History  Diagnosis Date  . Hypertension   . Pneumonia   . Chicken pox as a child  . Hyperlipidemia   . Vitamin D deficiency   . Thyroid disease   . Allergy   . Hyperglycemia   . Preventative health care 12/18/2012    Has seen Dr Carlota Raspberry for GYN in past  . Type II or unspecified type diabetes mellitus without mention of complication, not stated as uncontrolled   . Cervical cancer screening 01/25/2013  . Anxiety and depression 01/30/2013    MEDICATIONS:  Prior to Admission medications   Medication Sig Start Date End Date Taking? Authorizing Provider  acetaminophen (TYLENOL) 500 MG tablet Take 1,000 mg by mouth every 6 (six) hours as needed.    Historical Provider, MD  ALPRAZolam Duanne Moron) 0.25 MG tablet Take 1 tablet (0.25 mg total) by mouth 2 (two) times daily as needed for anxiety. 01/25/13   Mosie Lukes, MD  escitalopram (LEXAPRO) 10 MG tablet Take 1 tablet (10 mg total) by mouth daily. 01/25/13   Mosie Lukes,  MD  losartan-hydrochlorothiazide (HYZAAR) 100-25 MG per tablet Take 1 tablet by mouth daily.    Historical Provider, MD  Nebivolol HCl (BYSTOLIC) 20 MG TABS Take 1 tablet (20 mg total) by mouth daily. 12/16/12   Mosie Lukes, MD    ALLERGIES:  No Known Allergies  SOCIAL HISTORY:  History  Substance Use Topics  . Smoking status: Never Smoker   . Smokeless tobacco: Never Used  . Alcohol Use: No     Comment: No Pork    FAMILY HISTORY: Family History  Problem Relation Age of Onset  . Heart attack Maternal Grandfather 75  . Heart disease Maternal Grandfather   . Diabetes Maternal Grandmother     type 2  . Blindness Maternal Grandmother   . Hypertension Paternal Grandmother   . Stroke Paternal Grandfather   . Hypertension Sister   . Hypertension Sister     EXAM: BP 186/89  Pulse 88  Temp(Src) 99.3 F (37.4 C) (Oral)  Resp 24  Ht 5\' 8"  (1.727 m)  Wt 325 lb (147.419 kg)  BMI 49.43 kg/m2  SpO2 97%  LMP 02/18/2013 CONSTITUTIONAL: Alert and oriented and responds appropriately to questions. Well-appearing; well-nourished HEAD: Normocephalic EYES: Conjunctivae clear, PERRL ENT: normal nose; no rhinorrhea; moist mucous membranes; pharynx without lesions noted; no tonsillar hypertrophy or exudate NECK: Supple, no meningismus, no LAD  CARD: RRR; S1 and S2 appreciated; no murmurs, no clicks, no rubs, no gallops RESP: Normal chest  excursion without splinting or tachypnea; breath sounds equal bilaterally, patient has diffuse expiratory wheeze, no rhonchi or rales, no respiratory distress ABD/GI: Normal bowel sounds; non-distended; soft, non-tender, no rebound, no guarding BACK:  The back appears normal and is non-tender to palpation, there is no CVA tenderness EXT: Normal ROM in all joints; non-tender to palpation; no edema; normal capillary refill; no cyanosis    SKIN: Normal color for age and race; warm NEURO: Moves all extremities equally PSYCH: The patient's mood and manner  are appropriate. Grooming and personal hygiene are appropriate.  MEDICAL DECISION MAKING: Patient here with cough, fever and body aches. Her chest x-ray shows a right middle lobe pneumonia. She has no respiratory distress or hypoxia. She is wheezing diffusely. We'll give albuterol treatment and azithromycin. Will ambulate. Anticipate patient can be discharged home on oral antibiotics. Patient is comfortable with this plan.  ED PROGRESS: Patient's lungs have cleared. She has no shortness of breath with ambulation, oxygen saturation never dropped below 95%. We'll discharge home with azithromycin and albuterol.  Given patient has no history of reactive airway disease, do not feel steroids be beneficial at this time.  Given return precautions. Patient verbalizes understanding and is comfortable with plan.     [place], DO 03/04/13 2006

## 2013-03-04 NOTE — Progress Notes (Signed)
Patient ambulated around the department twice.  Patient's SPO2 remained above 95% and actually increased to 98% on her second trip around the department with no SOB.

## 2013-03-04 NOTE — Discharge Instructions (Signed)
Pneumonia, Adult °Pneumonia is an infection of the lungs.  °CAUSES °Pneumonia may be caused by bacteria or a virus. Usually, these infections are caused by breathing infectious particles into the lungs (respiratory tract). °SYMPTOMS  °· Cough. °· Fever. °· Chest pain. °· Increased rate of breathing. °· Wheezing. °· Mucus production. °DIAGNOSIS  °If you have the common symptoms of pneumonia, your caregiver will typically confirm the diagnosis with a chest X-ray. The X-ray will show an abnormality in the lung (pulmonary infiltrate) if you have pneumonia. Other tests of your blood, urine, or sputum may be done to find the specific cause of your pneumonia. Your caregiver may also do tests (blood gases or pulse oximetry) to see how well your lungs are working. °TREATMENT  °Some forms of pneumonia may be spread to other people when you cough or sneeze. You may be asked to wear a mask before and during your exam. Pneumonia that is caused by bacteria is treated with antibiotic medicine. Pneumonia that is caused by the influenza virus may be treated with an antiviral medicine. Most other viral infections must run their course. These infections will not respond to antibiotics.  °PREVENTION °A pneumococcal shot (vaccine) is available to prevent a common bacterial cause of pneumonia. This is usually suggested for: °· People over 65 years old. °· Patients on chemotherapy. °· People with chronic lung problems, such as bronchitis or emphysema. °· People with immune system problems. °If you are over 65 or have a high risk condition, you may receive the pneumococcal vaccine if you have not received it before. In some countries, a routine influenza vaccine is also recommended. This vaccine can help prevent some cases of pneumonia. You may be offered the influenza vaccine as part of your care. °If you smoke, it is time to quit. You may receive instructions on how to stop smoking. Your caregiver can provide medicines and counseling to  help you quit. °HOME CARE INSTRUCTIONS  °· Cough suppressants may be used if you are losing too much rest. However, coughing protects you by clearing your lungs. You should avoid using cough suppressants if you can. °· Your caregiver may have prescribed medicine if he or she thinks your pneumonia is caused by a bacteria or influenza. Finish your medicine even if you start to feel better. °· Your caregiver may also prescribe an expectorant. This loosens the mucus to be coughed up. °· Only take over-the-counter or prescription medicines for pain, discomfort, or fever as directed by your caregiver. °· Do not smoke. Smoking is a common cause of bronchitis and can contribute to pneumonia. If you are a smoker and continue to smoke, your cough may last several weeks after your pneumonia has cleared. °· A cold steam vaporizer or humidifier in your room or home may help loosen mucus. °· Coughing is often worse at night. Sleeping in a semi-upright position in a recliner or using a couple pillows under your head will help with this. °· Get rest as you feel it is needed. Your body will usually let you know when you need to rest. °SEEK IMMEDIATE MEDICAL CARE IF:  °· Your illness becomes worse. This is especially true if you are elderly or weakened from any other disease. °· You cannot control your cough with suppressants and are losing sleep. °· You begin coughing up blood. °· You develop pain which is getting worse or is uncontrolled with medicines. °· You have a fever. °· Any of the symptoms which initially brought you in for treatment   are getting worse rather than better.  You develop shortness of breath or chest pain. MAKE SURE YOU:   Understand these instructions.  Will watch your condition.  Will get help right away if you are not doing well or get worse. Document Released: 02/17/2005 Document Revised: 05/12/2011 Document Reviewed: 05/09/2010 [facility] Patient Information 2014 [place], [place].   You may  alternate between ibuprofen 800 mg every 8 hours and Tylenol 1000 mg every 6 hours as needed for fever and pain. Both of these medications are found over-the-counter. Please continue to drink plenty of fluids and rest.

## 2013-03-04 NOTE — ED Notes (Signed)
Productive cough with brown sputum.  Fever and body aches x 3 days.

## 2013-03-08 ENCOUNTER — Ambulatory Visit (HOSPITAL_BASED_OUTPATIENT_CLINIC_OR_DEPARTMENT_OTHER)
Admission: RE | Admit: 2013-03-08 | Discharge: 2013-03-08 | Disposition: A | Discharge status: Home / Self Care | Payer: 59 | Source: Ambulatory Visit | Attending: Family | Admitting: Family

## 2013-03-08 ENCOUNTER — Ambulatory Visit (INDEPENDENT_AMBULATORY_CARE_PROVIDER_SITE_OTHER): Payer: 59 | Admitting: Family

## 2013-03-08 ENCOUNTER — Encounter: Payer: Self-pay | Admitting: Family

## 2013-03-08 VITALS — BP 130/96 | HR 68 | Temp 98.6°F | Resp 18 | Ht 67.0 in | Wt 320.1 lb

## 2013-03-08 DIAGNOSIS — J189 Pneumonia, unspecified organism: Secondary | ICD-10-CM

## 2013-03-08 DIAGNOSIS — R05 Cough: Secondary | ICD-10-CM

## 2013-03-08 DIAGNOSIS — Z8701 Personal history of pneumonia (recurrent): Secondary | ICD-10-CM | POA: Insufficient documentation

## 2013-03-08 DIAGNOSIS — I517 Cardiomegaly: Secondary | ICD-10-CM | POA: Insufficient documentation

## 2013-03-08 DIAGNOSIS — R059 Cough, unspecified: Secondary | ICD-10-CM

## 2013-03-08 LAB — POCT INFLUENZA A/B
INFLUENZA A, POC: NEGATIVE
Influenza B, POC: NEGATIVE

## 2013-03-08 MED ORDER — PREDNISONE 10 MG PO TABS: ORAL_TABLET | ORAL | Status: DC

## 2013-03-08 MED ORDER — HYDROCOD POLST-CHLORPHEN POLST 10-8 MG/5ML PO LQCR: 5.0000 mL | Freq: Every evening | ORAL | Status: DC | PRN

## 2013-03-08 MED ORDER — ALBUTEROL SULFATE (2.5 MG/3ML) 0.083% IN NEBU
2.5000 mg | INHALATION_SOLUTION | Freq: Once | RESPIRATORY_TRACT | Status: AC
  Administered 2013-03-08: 2.5 mg via RESPIRATORY_TRACT

## 2013-03-08 NOTE — Progress Notes (Signed)
Pre visit review using our clinic review tool, if applicable. No additional management support is needed unless otherwise documented below in the visit note. 

## 2013-03-08 NOTE — Patient Instructions (Signed)
Use albuterol every 4-6 hours while awake. Start prednisone. Call if symptoms worsen, or if not improved in 2-3 days.

## 2013-03-08 NOTE — Progress Notes (Signed)
Subjective:    Patient ID: [name], female    DOB: [birth year], 48 y.o.   MRN: [id number]  HPI  Ms. Ventresca is a 48 yr old female who presents today for ED follow up. ED records are reviewed.  She presented to the ED on 03/04/13 with complaint of fever, body aches and cough.  She reported wheezing, sob, nausea and diarrhea.  CXR revealed RML pneumonia.  She was treated with azithromycin.  She reports no improvement in her symptoms. She continues to have chills, wheezing, SOB.  She is using the albuterol inhaler but does not seem to be helping her symptoms.     Review of Systems    see HPI  Past Medical History  Diagnosis Date  . Hypertension   . Pneumonia   . Chicken pox as a child  . Hyperlipidemia   . Vitamin D deficiency   . Thyroid disease   . Allergy   . Hyperglycemia   . Preventative health care 12/18/2012    Has seen Dr Carlota Raspberry for GYN in past  . Type II or unspecified type diabetes mellitus without mention of complication, not stated as uncontrolled   . Cervical cancer screening 01/25/2013  . Anxiety and depression 01/30/2013    History   Social History  . Marital Status: Married    Spouse Name: N/A    Number of Children: 0  . Years of Education: N/A   Occupational History  . SAL[employer]    Social History Main Topics  . Smoking status: Never Smoker   . Smokeless tobacco: Never Used  . Alcohol Use: No     Comment: No Pork  . Drug Use: No  . Sexual Activity: Yes    Partners: Male    Birth Control/ Protection: None     Comment: lives with husband and [place], [place]   Other Topics Concern  . Not on file   Social History Narrative  . No narrative on file    Past Surgical History  Procedure Laterality Date  . None    . Wisdom tooth extraction  48 yrs old    Family History  Problem Relation Age of Onset  . Heart attack Maternal Grandfather 75  . Heart disease Maternal Grandfather   . Diabetes Maternal Grandmother     type 2  .  Blindness Maternal Grandmother   . Hypertension Paternal Grandmother   . Stroke Paternal Grandfather   . Hypertension Sister   . Hypertension Sister     No Known Allergies  Current Outpatient Prescriptions on File Prior to Visit  Medication Sig Dispense Refill  . acetaminophen (TYLENOL) 500 MG tablet Take 1,000 mg by mouth every 6 (six) hours as needed.      Marland Kitchen albuterol (PROVENTIL HFA;VENTOLIN HFA) 108 (90 BASE) MCG/ACT inhaler Inhale 2 puffs into the lungs every 4 (four) hours as needed for wheezing or shortness of breath.  1 Inhaler  0  . azithromycin (ZITHROMAX) 250 MG tablet Take 1 tablet (250 mg total) by mouth daily. Please start taking this medication 03/05/13  4 tablet  0  . losartan-hydrochlorothiazide (HYZAAR) 100-25 MG per tablet Take 1 tablet by mouth daily.      . Nebivolol HCl (BYSTOLIC) 20 MG TABS Take 1 tablet (20 mg total) by mouth daily.  30 tablet  3  . ALPRAZolam (XANAX) 0.25 MG tablet Take 1 tablet (0.25 mg total) by mouth 2 (two) times daily as needed for anxiety.  20 tablet  0  No current facility-administered medications on file prior to visit.    BP 130/96  Pulse 68  Temp(Src) 98.6 F (37 C) (Oral)  Resp 18  Ht 5\' 7"  (1.702 m)  Wt 320 lb 1.3 oz (145.187 kg)  BMI 50.12 kg/m2  SpO2 96%  LMP 02/18/2013    Objective:   Physical Exam  Constitutional: She is oriented to person, place, and time. She appears well-developed and well-nourished. No distress.  HENT:  Head: Normocephalic and atraumatic.  Right Ear: Tympanic membrane and ear canal normal.  Left Ear: Tympanic membrane and ear canal normal.  Eyes: No scleral icterus.  Cardiovascular: Normal rate and regular rhythm.   No murmur heard. Pulmonary/Chest: She has decreased breath sounds in the right upper field, the right middle field and the right lower field. She has wheezes in the left upper field and the left middle field.  Lymphadenopathy:    She has no cervical adenopathy.  Neurological: She is  alert and oriented to person, place, and time.  Skin: Skin is warm and dry.  Psychiatric: She has a normal mood and affect. Her behavior is normal. Judgment and thought content normal.          Assessment & Plan:

## 2013-03-09 ENCOUNTER — Encounter: Payer: Self-pay | Admitting: Family

## 2013-03-09 ENCOUNTER — Telehealth: Payer: Self-pay | Admitting: Family Medicine

## 2013-03-09 NOTE — Telephone Encounter (Signed)
Needs note for work 1-8 and 1 9  She will return to work on Monday

## 2013-03-09 NOTE — Telephone Encounter (Signed)
Calling again, please advise

## 2013-03-09 NOTE — Telephone Encounter (Signed)
See letter.

## 2013-03-09 NOTE — Telephone Encounter (Signed)
Patient states she is not feeling any better  Needs to stay home till Monday.  Could she pick up a note to excuse her from work on the 8th and 9th

## 2013-03-10 DIAGNOSIS — J189 Pneumonia, unspecified organism: Secondary | ICD-10-CM | POA: Insufficient documentation

## 2013-03-10 NOTE — Assessment & Plan Note (Signed)
CXR notes resolution of pneumonia.  Continued wheezing and cough. Plan albuterol Q4-6 hrs and prednisone taper.

## 2013-03-11 NOTE — Telephone Encounter (Signed)
Informed patient that letter was ready for pickup.

## 2013-03-11 NOTE — Telephone Encounter (Signed)
Left message on voicemail to return my call. Letter sent to front desk for pick up.

## 2013-03-21 ENCOUNTER — Emergency Department (HOSPITAL_COMMUNITY): Payer: 59

## 2013-03-21 ENCOUNTER — Encounter (HOSPITAL_COMMUNITY): Payer: Self-pay | Admitting: Emergency Medicine

## 2013-03-21 ENCOUNTER — Emergency Department (HOSPITAL_COMMUNITY)
Admission: EM | Admit: 2013-03-21 | Discharge: 2013-03-21 | Disposition: A | Discharge status: Home / Self Care | Payer: 59 | Attending: Emergency Medicine | Admitting: Emergency Medicine

## 2013-03-21 DIAGNOSIS — Z8701 Personal history of pneumonia (recurrent): Secondary | ICD-10-CM | POA: Insufficient documentation

## 2013-03-21 DIAGNOSIS — R6883 Chills (without fever): Secondary | ICD-10-CM | POA: Insufficient documentation

## 2013-03-21 DIAGNOSIS — A088 Other specified intestinal infections: Secondary | ICD-10-CM | POA: Insufficient documentation

## 2013-03-21 DIAGNOSIS — Z3202 Encounter for pregnancy test, result negative: Secondary | ICD-10-CM | POA: Insufficient documentation

## 2013-03-21 DIAGNOSIS — Z79899 Other long term (current) drug therapy: Secondary | ICD-10-CM | POA: Insufficient documentation

## 2013-03-21 DIAGNOSIS — F3289 Other specified depressive episodes: Secondary | ICD-10-CM | POA: Insufficient documentation

## 2013-03-21 DIAGNOSIS — A084 Viral intestinal infection, unspecified: Secondary | ICD-10-CM

## 2013-03-21 DIAGNOSIS — R071 Chest pain on breathing: Secondary | ICD-10-CM | POA: Insufficient documentation

## 2013-03-21 DIAGNOSIS — F411 Generalized anxiety disorder: Secondary | ICD-10-CM | POA: Insufficient documentation

## 2013-03-21 DIAGNOSIS — F329 Major depressive disorder, single episode, unspecified: Secondary | ICD-10-CM | POA: Insufficient documentation

## 2013-03-21 DIAGNOSIS — E119 Type 2 diabetes mellitus without complications: Secondary | ICD-10-CM | POA: Insufficient documentation

## 2013-03-21 DIAGNOSIS — I10 Essential (primary) hypertension: Secondary | ICD-10-CM | POA: Insufficient documentation

## 2013-03-21 DIAGNOSIS — Z8619 Personal history of other infectious and parasitic diseases: Secondary | ICD-10-CM | POA: Insufficient documentation

## 2013-03-21 DIAGNOSIS — R0789 Other chest pain: Secondary | ICD-10-CM

## 2013-03-21 LAB — URINALYSIS, ROUTINE W REFLEX MICROSCOPIC
Bilirubin Urine: NEGATIVE
GLUCOSE, UA: NEGATIVE mg/dL
Hgb urine dipstick: NEGATIVE
Ketones, ur: NEGATIVE mg/dL
LEUKOCYTES UA: NEGATIVE
Nitrite: NEGATIVE
PH: 7 (ref 5.0–8.0)
Protein, ur: NEGATIVE mg/dL
Specific Gravity, Urine: 1.018 (ref 1.005–1.030)
Urobilinogen, UA: 0.2 mg/dL (ref 0.0–1.0)

## 2013-03-21 LAB — LIPASE, BLOOD: Lipase: 25 U/L (ref 11–59)

## 2013-03-21 LAB — CBC WITH DIFFERENTIAL/PLATELET
Basophils Absolute: 0 10*3/uL (ref 0.0–0.1)
Basophils Relative: 0 % (ref 0–1)
EOS ABS: 0.1 10*3/uL (ref 0.0–0.7)
Eosinophils Relative: 2 % (ref 0–5)
HCT: 40.8 % (ref 36.0–46.0)
Hemoglobin: 13.3 g/dL (ref 12.0–15.0)
LYMPHS PCT: 17 % (ref 12–46)
Lymphs Abs: 0.9 10*3/uL (ref 0.7–4.0)
MCH: 25.3 pg — AB (ref 26.0–34.0)
MCHC: 32.6 g/dL (ref 30.0–36.0)
MCV: 77.7 fL — ABNORMAL LOW (ref 78.0–100.0)
Monocytes Absolute: 0.3 10*3/uL (ref 0.1–1.0)
Monocytes Relative: 5 % (ref 3–12)
Neutro Abs: 3.8 10*3/uL (ref 1.7–7.7)
Neutrophils Relative %: 75 % (ref 43–77)
PLATELETS: 210 10*3/uL (ref 150–400)
RBC: 5.25 MIL/uL — AB (ref 3.87–5.11)
RDW: 15.3 % (ref 11.5–15.5)
WBC: 5 10*3/uL (ref 4.0–10.5)

## 2013-03-21 LAB — COMPREHENSIVE METABOLIC PANEL
ALBUMIN: 3.5 g/dL (ref 3.5–5.2)
ALK PHOS: 95 U/L (ref 39–117)
ALT: 10 U/L (ref 0–35)
AST: 21 U/L (ref 0–37)
BILIRUBIN TOTAL: 0.8 mg/dL (ref 0.3–1.2)
BUN: 13 mg/dL (ref 6–23)
CO2: 26 mEq/L (ref 19–32)
Calcium: 8.7 mg/dL (ref 8.4–10.5)
Chloride: 100 mEq/L (ref 96–112)
Creatinine, Ser: 0.76 mg/dL (ref 0.50–1.10)
GFR calc non Af Amer: >90 mL/min (ref >90)
GLUCOSE: 123 mg/dL — AB (ref 70–99)
POTASSIUM: 3.7 meq/L (ref 3.7–5.3)
SODIUM: 139 meq/L (ref 137–147)
TOTAL PROTEIN: 8 g/dL (ref 6.0–8.3)

## 2013-03-21 LAB — PREGNANCY, URINE: Preg Test, Ur: NEGATIVE

## 2013-03-21 LAB — TROPONIN I

## 2013-03-21 MED ORDER — IBUPROFEN 800 MG PO TABS: 800.0000 mg | ORAL_TABLET | Freq: Four times a day (QID) | ORAL | Status: DC | PRN

## 2013-03-21 MED ORDER — ONDANSETRON HCL 4 MG/2ML IJ SOLN
4.0000 mg | Freq: Once | INTRAMUSCULAR | Status: AC
  Administered 2013-03-21: 4 mg via INTRAVENOUS
  Filled 2013-03-21: qty 2

## 2013-03-21 MED ORDER — SODIUM CHLORIDE 0.9 % IV BOLUS (SEPSIS)
1000.0000 mL | Freq: Once | INTRAVENOUS | Status: AC
  Administered 2013-03-21: 1000 mL via INTRAVENOUS

## 2013-03-21 MED ORDER — ONDANSETRON 4 MG PO TBDP: 4.0000 mg | ORAL_TABLET | Freq: Three times a day (TID) | ORAL | Status: DC | PRN

## 2013-03-21 MED ORDER — ACETAMINOPHEN 500 MG PO TABS: 500.0000 mg | ORAL_TABLET | Freq: Four times a day (QID) | ORAL | Status: DC | PRN

## 2013-03-21 MED ORDER — METOCLOPRAMIDE HCL 5 MG/ML IJ SOLN
10.0000 mg | Freq: Once | INTRAMUSCULAR | Status: AC
  Administered 2013-03-21: 10 mg via INTRAVENOUS
  Filled 2013-03-21: qty 2

## 2013-03-21 NOTE — ED Notes (Signed)
C/o n/v/d, abd pain, chills since midnight & left side CP started afterwards. Denies bloody emesis or stools. States family member at home sick as well. Reports has not taken her daily meds today

## 2013-03-21 NOTE — ED Notes (Signed)
Patient transported to X-ray 

## 2013-03-21 NOTE — ED Provider Notes (Signed)
Medical screening examination/treatment/procedure(s) were performed by non-physician practitioner and as supervising physician I was immediately available for consultation/collaboration.  EKG Interpretation   None        Leota Jacobsen, MD 03/21/13 [phone]

## 2013-03-21 NOTE — ED Provider Notes (Signed)
CSN: [id number]     Arrival date & time 03/21/13  0840 History   First MD Initiated Contact with Patient 03/21/13 0848     Chief Complaint  Patient presents with  . Emesis  . Diarrhea  . Chest Pain   (Consider location/radiation/quality/duration/timing/severity/associated sxs/prior Treatment) HPI Comments: Patient is a 48 yo F PMHx HTN, Thyroid Disease, HLD, anxiety, and depression presenting to the ED for 9 hours of acute onset nausea, six episodes of non-bloody non-bilious emesis, three episodes of non-bloody diarrhea with associated myalgias and generalized abdominal pain. Patient states her abdominal pain is cramping in nature and worsened with vomiting and diarrhea. Patient states her grandchild is sick at home with the same symptoms. Patient is also complaining about left sided CP that began at some point after the N/V/D started last evening. Patient states her CP is non-radiating and rates it a 10/10 with no alleviating factors. Patient is followed in [facility] by a cardiologist with her last echo and stress test approximately three years ago. No history of MI. No early familial cardiac history. PERC negative. No abdominal surgical history. Patient was recently treated for CAP. She finished her antibiotics. Patient has not taken her medications today.   Patient is a 48 y.o. female presenting with vomiting, diarrhea, and chest pain.  Emesis Associated symptoms: abdominal pain, chills and diarrhea   Diarrhea Associated symptoms: abdominal pain, chills and vomiting   Associated symptoms: no fever   Chest Pain Associated symptoms: abdominal pain, nausea and vomiting   Associated symptoms: no cough, no fever and no shortness of breath     Past Medical History  Diagnosis Date  . Hypertension   . Pneumonia   . Chicken pox as a child  . Hyperlipidemia   . Vitamin D deficiency   . Thyroid disease   . Allergy   . Hyperglycemia   . Preventative health care 12/18/2012    Has seen Dr  Carlota Raspberry for GYN in past  . Type II or unspecified type diabetes mellitus without mention of complication, not stated as uncontrolled   . Cervical cancer screening 01/25/2013  . Anxiety and depression 01/30/2013   Past Surgical History  Procedure Laterality Date  . None    . Wisdom tooth extraction  49 yrs old   Family History  Problem Relation Age of Onset  . Heart attack Maternal Grandfather 75  . Heart disease Maternal Grandfather   . Diabetes Maternal Grandmother     type 2  . Blindness Maternal Grandmother   . Hypertension Paternal Grandmother   . Stroke Paternal Grandfather   . Hypertension Sister   . Hypertension Sister    History  Substance Use Topics  . Smoking status: Never Smoker   . Smokeless tobacco: Never Used  . Alcohol Use: No     Comment: No Pork   OB History   Grav Para Term Preterm Abortions TAB SAB Ect Mult Living                 Review of Systems  Constitutional: Positive for chills. Negative for fever.  Respiratory: Negative for cough and shortness of breath.   Cardiovascular: Positive for chest pain. Negative for leg swelling.  Gastrointestinal: Positive for nausea, vomiting, abdominal pain and diarrhea. Negative for constipation, blood in stool and anal bleeding.  All other systems reviewed and are negative.    Allergies  Review of patient's allergies indicates no known allergies.  Home Medications   Current Outpatient Rx  Name  Route  Sig  Dispense  Refill  . albuterol (PROVENTIL HFA;VENTOLIN HFA) 108 (90 BASE) MCG/ACT inhaler   Inhalation   Inhale 2 puffs into the lungs every 4 (four) hours as needed for wheezing or shortness of breath.   1 Inhaler   0   . ALPRAZolam (XANAX) 0.25 MG tablet   Oral   Take 1 tablet (0.25 mg total) by mouth 2 (two) times daily as needed for anxiety.   20 tablet   0   . chlorpheniramine-HYDROcodone (TUSSIONEX PENNKINETIC ER) 10-8 MG/5ML LQCR   Oral   Take 5 mLs by mouth at bedtime as needed for  cough.   115 mL   0   . losartan-hydrochlorothiazide (HYZAAR) 100-25 MG per tablet   Oral   Take 1 tablet by mouth daily.         . Nebivolol HCl (BYSTOLIC) 20 MG TABS   Oral   Take 1 tablet (20 mg total) by mouth daily.   30 tablet   3   . acetaminophen (TYLENOL) 500 MG tablet   Oral   Take 1 tablet (500 mg total) by mouth every 6 (six) hours as needed for mild pain, moderate pain or fever.   30 tablet   0   . ibuprofen (ADVIL,MOTRIN) 800 MG tablet   Oral   Take 1 tablet (800 mg total) by mouth every 6 (six) hours as needed for fever, mild pain or moderate pain.   21 tablet   0   . ondansetron (ZOFRAN ODT) 4 MG disintegrating tablet   Oral   Take 1 tablet (4 mg total) by mouth every 8 (eight) hours as needed for nausea or vomiting.   10 tablet   0    BP 157/83  Pulse 97  Temp(Src) 98.5 F (36.9 C) (Oral)  Resp 22  Ht 5\' 7"  (1.702 m)  Wt 320 lb (145.151 kg)  BMI 50.11 kg/m2  SpO2 97%  LMP 03/12/2013 Physical Exam  Constitutional: She is oriented to person, place, and time. She appears well-developed and well-nourished. No distress.  Patient walking around examination room upon my entrance.  HENT:  Head: Normocephalic and atraumatic.  Right Ear: External ear normal.  Left Ear: External ear normal.  Nose: Nose normal.  Mouth/Throat: Oropharynx is clear and moist. No oropharyngeal exudate.  Eyes: Conjunctivae are normal.  Neck: Normal range of motion. Neck supple.  Cardiovascular: Normal rate, regular rhythm, normal heart sounds and intact distal pulses.   Pulmonary/Chest: Effort normal and breath sounds normal. No respiratory distress. She has no wheezes. She exhibits tenderness. She exhibits no deformity and no swelling.    Abdominal: Soft. Bowel sounds are normal. She exhibits no distension. There is no tenderness. There is no rigidity, no rebound, no guarding, no tenderness at McBurney's point and negative Murphy's sign.  Musculoskeletal: Normal range of  motion. She exhibits no edema and no tenderness.  Lymphadenopathy:    She has no cervical adenopathy.  Neurological: She is alert and oriented to person, place, and time.  Skin: Skin is warm and dry. She is not diaphoretic.    ED Course  Procedures (including critical care time) Medications  ondansetron (ZOFRAN) injection 4 mg (4 mg Intravenous Given 03/21/13 0930)  sodium chloride 0.9 % bolus 1,000 mL (0 mLs Intravenous Stopped 03/21/13 1033)  metoCLOPramide (REGLAN) injection 10 mg (10 mg Intravenous Given 03/21/13 1048)    Labs Review Labs Reviewed  CBC WITH DIFFERENTIAL - Abnormal; Notable for the following:  RBC 5.25 (*)    MCV 77.7 (*)    MCH 25.3 (*)    All other components within normal limits  COMPREHENSIVE METABOLIC PANEL - Abnormal; Notable for the following:    Glucose, Bld 123 (*)    All other components within normal limits  URINALYSIS, ROUTINE W REFLEX MICROSCOPIC  PREGNANCY, URINE  TROPONIN I  LIPASE, BLOOD   Imaging Review Dg Chest 2 View  03/21/2013   CLINICAL DATA:  Chest pain  EXAM: CHEST  2 VIEW  COMPARISON:  March 08, 2013  FINDINGS: The lungs are clear. Heart is borderline enlarged with normal pulmonary vascularity. No adenopathy. No bone lesions. No pneumothorax.  IMPRESSION: Heart is borderline enlarged.  No edema or consolidation.   Electronically Signed   By: Lowella Grip M.D.   On: 03/21/2013 10:43    EKG Interpretation   None       MDM   1. Viral gastroenteritis   2. Chest wall pain     Filed Vitals:   03/21/13 1204  BP:   Pulse:   Temp: 98.5 F (36.9 C)  Resp:    1) Gastroenteritis: Patient with symptoms consistent with viral gastroenteritis.  Vitals are stable, no fever.  No signs of dehydration, tolerating PO fluids > 6 oz.  Lungs are clear.  No focal abdominal pain, no concern for appendicitis, cholecystitis, pancreatitis, ruptured viscus, UTI, kidney stone, or any other abdominal etiology.  Supportive therapy indicated with  return if symptoms worsen.  Patient counseled.  2) CP: Patient is to be discharged with recommendation to follow up with PCP in regards to today's hospital visit. Chest pain is not likely of cardiac or pulmonary etiology d/t presentation, perc negative, VSS, no tracheal deviation, no JVD or new murmur, RRR, breath sounds equal bilaterally, EKG without acute abnormalities, negative troponin, and negative CXR. CP is likely related to myalgias with viral gastroenteritis. Will prescribe Motrin and tylenol. Pt appears reliable for follow up and is agreeable to discharge.   Case has been discussed with and seen by Dr. Zenia Resides who agrees with the above plan to discharge.      Harlow Mares, PA-C 03/21/13 1405

## 2013-03-21 NOTE — Discharge Instructions (Signed)
Please follow up with your primary care physician in 1-2 days. If you do not have one please call the [facility] number listed above. Please take Zofran as prescribed to help with nausea. Please read all discharge instructions and return precautions.   Viral Gastroenteritis Viral gastroenteritis is also known as stomach flu. This condition affects the stomach and intestinal tract. It can cause sudden diarrhea and vomiting. The illness typically lasts 3 to 8 days. Most people develop an immune response that eventually gets rid of the virus. While this natural response develops, the virus can make you quite ill. CAUSES  Many different viruses can cause gastroenteritis, such as rotavirus or noroviruses. You can catch one of these viruses by consuming contaminated food or water. You may also catch a virus by sharing utensils or other personal items with an infected person or by touching a contaminated surface. SYMPTOMS  The most common symptoms are diarrhea and vomiting. These problems can cause a severe loss of body fluids (dehydration) and a body salt (electrolyte) imbalance. Other symptoms may include:  Fever.  Headache.  Fatigue.  Abdominal pain. DIAGNOSIS  Your caregiver can usually diagnose viral gastroenteritis based on your symptoms and a physical exam. A stool sample may also be taken to test for the presence of viruses or other infections. TREATMENT  This illness typically goes away on its own. Treatments are aimed at rehydration. The most serious cases of viral gastroenteritis involve vomiting so severely that you are not able to keep fluids down. In these cases, fluids must be given through an intravenous line (IV). HOME CARE INSTRUCTIONS   Drink enough fluids to keep your urine clear or pale yellow. Drink small amounts of fluids frequently and increase the amounts as tolerated.  Ask your caregiver for specific rehydration instructions.  Avoid:  Foods high in  sugar.  Alcohol.  Carbonated drinks.  Tobacco.  Juice.  Caffeine drinks.  Extremely hot or cold fluids.  Fatty, greasy foods.  Too much intake of anything at one time.  Dairy products until 24 to 48 hours after diarrhea stops.  You may consume probiotics. Probiotics are active cultures of beneficial bacteria. They may lessen the amount and number of diarrheal stools in adults. Probiotics can be found in yogurt with active cultures and in supplements.  Wash your hands well to avoid spreading the virus.  Only take over-the-counter or prescription medicines for pain, discomfort, or fever as directed by your caregiver. Do not give aspirin to children. Antidiarrheal medicines are not recommended.  Ask your caregiver if you should continue to take your regular prescribed and over-the-counter medicines.  Keep all follow-up appointments as directed by your caregiver. SEEK IMMEDIATE MEDICAL CARE IF:   You are unable to keep fluids down.  You do not urinate at least once every 6 to 8 hours.  You develop shortness of breath.  You notice blood in your stool or vomit. This may look like coffee grounds.  You have abdominal pain that increases or is concentrated in one small area (localized).  You have persistent vomiting or diarrhea.  You have a fever.  The patient is a child younger than 3 months, and he or she has a fever.  The patient is a child older than 3 months, and he or she has a fever and persistent symptoms.  The patient is a child older than 3 months, and he or she has a fever and symptoms suddenly get worse.  The patient is a  baby, and he or she has no tears when crying. MAKE SURE YOU:   Understand these instructions.  Will watch your condition.  Will get help right away if you are not doing well or get worse. Document Released: 02/17/2005 Document Revised: 05/12/2011 Document Reviewed: 12/04/2010 [facility] Patient Information 2014 [place].   Chest  Pain (Nonspecific) It is often hard to give a specific diagnosis for the cause of chest pain. There is always a chance that your pain could be related to something serious, such as a heart attack or a blood clot in the lungs. You need to follow up with your caregiver for further evaluation. CAUSES   Heartburn.  Pneumonia or bronchitis.  Anxiety or stress.  Inflammation around your heart (pericarditis) or lung (pleuritis or pleurisy).  A blood clot in the lung.  A collapsed lung (pneumothorax). It can develop suddenly on its own (spontaneous pneumothorax) or from injury (trauma) to the chest.  Shingles infection (herpes zoster virus). The chest wall is composed of bones, muscles, and cartilage. Any of these can be the source of the pain.  The bones can be bruised by injury.  The muscles or cartilage can be strained by coughing or overwork.  The cartilage can be affected by inflammation and become sore (costochondritis). DIAGNOSIS  Lab tests or other studies, such as X-rays, electrocardiography, stress testing, or cardiac imaging, may be needed to find the cause of your pain.  TREATMENT   Treatment depends on what may be causing your chest pain. Treatment may include:  Acid blockers for heartburn.  Anti-inflammatory medicine.  Pain medicine for inflammatory conditions.  Antibiotics if an infection is present.  You may be advised to change lifestyle habits. This includes stopping smoking and avoiding alcohol, caffeine, and chocolate.  You may be advised to keep your head raised (elevated) when sleeping. This reduces the chance of acid going backward from your stomach into your esophagus.  Most of the time, nonspecific chest pain will improve within 2 to 3 days with rest and mild pain medicine. HOME CARE INSTRUCTIONS   If antibiotics were prescribed, take your antibiotics as directed. Finish them even if you start to feel better.  For the next few days, avoid physical  activities that bring on chest pain. Continue physical activities as directed.  Do not smoke.  Avoid drinking alcohol.  Only take over-the-counter or prescription medicine for pain, discomfort, or fever as directed by your caregiver.  Follow your caregiver's suggestions for further testing if your chest pain does not go away.  Keep any follow-up appointments you made. If you do not go to an appointment, you could develop lasting (chronic) problems with pain. If there is any problem keeping an appointment, you must call to reschedule. SEEK MEDICAL CARE IF:   You think you are having problems from the medicine you are taking. Read your medicine instructions carefully.  Your chest pain does not go away, even after treatment.  You develop a rash with blisters on your chest. SEEK IMMEDIATE MEDICAL CARE IF:   You have increased chest pain or pain that spreads to your arm, neck, jaw, back, or abdomen.  You develop shortness of breath, an increasing cough, or you are coughing up blood.  You have severe back or abdominal pain, feel nauseous, or vomit.  You develop severe weakness, fainting, or chills.  You have a fever. THIS IS AN EMERGENCY. Do not wait to see if the pain will go away. Get medical help at once. Call  your local emergency services (911 in [place] Do not drive yourself to the hospital. MAKE SURE YOU:   Understand these instructions.  Will watch your condition.  Will get help right away if you are not doing well or get worse. Document Released: 11/27/2004 Document Revised: 05/12/2011 Document Reviewed: 09/23/2007 [facility] Patient Information 2014 [place].

## 2013-03-21 NOTE — ED Notes (Signed)
Pt states grandkids sick, she started vomiting, diarrhea all last nite. Now reports left sided chest pain and sob.  No cardiac history

## 2013-03-28 ENCOUNTER — Encounter: Payer: Self-pay | Admitting: Physician Assistant

## 2013-03-28 ENCOUNTER — Ambulatory Visit (INDEPENDENT_AMBULATORY_CARE_PROVIDER_SITE_OTHER): Payer: 59 | Admitting: Physician Assistant

## 2013-03-28 VITALS — BP 138/102 | HR 85 | Temp 98.0°F | Resp 16 | Ht 67.0 in | Wt 320.5 lb

## 2013-03-28 DIAGNOSIS — R42 Dizziness and giddiness: Secondary | ICD-10-CM

## 2013-03-28 DIAGNOSIS — F411 Generalized anxiety disorder: Secondary | ICD-10-CM

## 2013-03-28 MED ORDER — ALPRAZOLAM 0.25 MG PO TABS: 0.2500 mg | ORAL_TABLET | Freq: Two times a day (BID) | ORAL | Status: DC | PRN

## 2013-03-28 MED ORDER — CITALOPRAM HYDROBROMIDE 10 MG PO TABS: 10.0000 mg | ORAL_TABLET | Freq: Every day | ORAL | Status: DC

## 2013-03-28 NOTE — Progress Notes (Signed)
Pre visit review using our clinic review tool, if applicable. No additional management support is needed unless otherwise documented below in the visit note/SLS  

## 2013-03-28 NOTE — Patient Instructions (Signed)
Please take 1/2 tablet of citalopram x 1 week. Then increase to 1 tablet daily.  Continue Xanax as needed for anxiety. Please call and set up an appointment with a counselor for you and your husband.  Please obtain labs.  I will call you with your results.  Eat a small meal every 3 hours to help regulate your blood sugars.  Drink plenty of fluids.  Please take a claritin dail y for allergy symptoms.  Follow-up with Dr. Charlett Blake on 04/01/13

## 2013-03-29 LAB — BASIC METABOLIC PANEL
BUN: 13 mg/dL (ref 6–23)
CALCIUM: 9.5 mg/dL (ref 8.4–10.5)
CO2: 32 mEq/L (ref 19–32)
CREATININE: 0.83 mg/dL (ref 0.50–1.10)
Chloride: 101 mEq/L (ref 96–112)
GLUCOSE: 124 mg/dL — AB (ref 70–99)
Potassium: 3.8 mEq/L (ref 3.5–5.3)
Sodium: 141 mEq/L (ref 135–145)

## 2013-03-29 LAB — CBC WITH DIFFERENTIAL/PLATELET
BASOS PCT: 0 % (ref 0–1)
Basophils Absolute: 0 10*3/uL (ref 0.0–0.1)
EOS ABS: 0.2 10*3/uL (ref 0.0–0.7)
Eosinophils Relative: 3 % (ref 0–5)
HEMATOCRIT: 38.3 % (ref 36.0–46.0)
HEMOGLOBIN: 12.5 g/dL (ref 12.0–15.0)
Lymphocytes Relative: 43 % (ref 12–46)
Lymphs Abs: 2.6 10*3/uL (ref 0.7–4.0)
MCH: 24.2 pg — AB (ref 26.0–34.0)
MCHC: 32.6 g/dL (ref 30.0–36.0)
MCV: 74.1 fL — ABNORMAL LOW (ref 78.0–100.0)
MONO ABS: 0.4 10*3/uL (ref 0.1–1.0)
Monocytes Relative: 6 % (ref 3–12)
Neutro Abs: 2.9 10*3/uL (ref 1.7–7.7)
Neutrophils Relative %: 48 % (ref 43–77)
Platelets: 249 10*3/uL (ref 150–400)
RBC: 5.17 MIL/uL — ABNORMAL HIGH (ref 3.87–5.11)
RDW: 15.4 % (ref 11.5–15.5)
WBC: 6.1 10*3/uL (ref 4.0–10.5)

## 2013-03-29 LAB — TSH: TSH: 1.321 u[IU]/mL (ref 0.350–4.500)

## 2013-03-30 DIAGNOSIS — R42 Dizziness and giddiness: Secondary | ICD-10-CM | POA: Insufficient documentation

## 2013-03-30 DIAGNOSIS — F411 Generalized anxiety disorder: Secondary | ICD-10-CM | POA: Insufficient documentation

## 2013-03-30 NOTE — Assessment & Plan Note (Addendum)
Symptoms present greater than 6 months. Worsening recently. Refill Xanax. Patient instructed to take this prescribed. Discussed with patient the need for an additional medication for her symptoms, most likely an SSRI. Patient has upcoming appointment with her PCP. We'll discuss this with PCP at next visit. Patient given handout on lower therapists with instruction to schedule an appointment for her and her husband.

## 2013-03-30 NOTE — Progress Notes (Signed)
Patient presents to clinic today c/o lightheadedness and fatigue that has been present over the past 3-4 days. Patient denies dizziness, syncope, chest pain or palpitations. Patient has been eating regularly. Endorses good fluid intake. Patient does endorse increased stress and anxiety. Patient does have history of anxiety and depression for which she takes an occasional Xanax. Patient has stopped taking her medication. Patient become slightly tearful during the interview, stating she and her husband need referral to a marriage counselor. A lot of her increased stress and anxiety stemming from her marriage. Denies suicidal thought or ideation. Denies substance abuse. Denies hallucination. Patient does wonder if her lightheadedness and fatigue are stemming from her anxiety and depression. Patient does have history of vitamin D deficiency. Patient denies history of anemia. Patient is also concerned that her blood sugar may be low. Patient recently had an elevated A1c at 6.6. Has not had confirmatory testing for diagnosis of diabetes. Point-of-care glucose is 145 nonfasting.  Past Medical History  Diagnosis Date  . Hypertension   . Pneumonia   . Chicken pox as a child  . Hyperlipidemia   . Vitamin D deficiency   . Thyroid disease   . Allergy   . Hyperglycemia   . Preventative health care 12/18/2012    Has seen Dr Carlota Raspberry for GYN in past  . Type II or unspecified type diabetes mellitus without mention of complication, not stated as uncontrolled   . Cervical cancer screening 01/25/2013  . Anxiety and depression 01/30/2013    Current Outpatient Prescriptions on File Prior to Visit  Medication Sig Dispense Refill  . acetaminophen (TYLENOL) 500 MG tablet Take 1 tablet (500 mg total) by mouth every 6 (six) hours as needed for mild pain, moderate pain or fever.  30 tablet  0  . albuterol (PROVENTIL HFA;VENTOLIN HFA) 108 (90 BASE) MCG/ACT inhaler Inhale 2 puffs into the lungs every 4 (four) hours as needed  for wheezing or shortness of breath.  1 Inhaler  0  . ibuprofen (ADVIL,MOTRIN) 800 MG tablet Take 1 tablet (800 mg total) by mouth every 6 (six) hours as needed for fever, mild pain or moderate pain.  21 tablet  0  . losartan-hydrochlorothiazide (HYZAAR) 100-25 MG per tablet Take 1 tablet by mouth daily.      . Nebivolol HCl (BYSTOLIC) 20 MG TABS Take 1 tablet (20 mg total) by mouth daily.  30 tablet  3  . ondansetron (ZOFRAN ODT) 4 MG disintegrating tablet Take 1 tablet (4 mg total) by mouth every 8 (eight) hours as needed for nausea or vomiting.  10 tablet  0   No current facility-administered medications on file prior to visit.    No Known Allergies  Family History  Problem Relation Age of Onset  . Heart attack Maternal Grandfather 75  . Heart disease Maternal Grandfather   . Diabetes Maternal Grandmother     type 2  . Blindness Maternal Grandmother   . Hypertension Paternal Grandmother   . Stroke Paternal Grandfather   . Hypertension Sister   . Hypertension Sister     History   Social History  . Marital Status: Married    Spouse Name: N/A    Number of Children: 0  . Years of Education: N/A   Occupational History  . SALES [employer]    Social History Main Topics  . Smoking status: Never Smoker   . Smokeless tobacco: Never Used  . Alcohol Use: No     Comment: No Pork  . Drug Use:  No  . Sexual Activity: Yes    Partners: Male    Birth Control/ Protection: None     Comment: lives with husband and [place], [place]   Other Topics Concern  . None   Social History Narrative  . None   Review of Systems - See HPI.  All other ROS are negative.  Filed Vitals:   03/28/13 1437  BP: 138/102  Pulse: 85  Temp: 98 F (36.7 C)  Resp: 16   Physical Exam  Vitals reviewed. Constitutional: She is oriented to person, place, and time and well-developed, well-nourished, and in no distress.  HENT:  Head: Normocephalic and atraumatic.  Right Ear: External ear normal.  Left  Ear: External ear normal.  Nose: Nose normal.  Mouth/Throat: Oropharynx is clear and moist. No oropharyngeal exudate.  Eyes: Conjunctivae are normal. Pupils are equal, round, and reactive to light.  Neck: Neck supple. No thyromegaly present.  Cardiovascular: Normal rate, regular rhythm, normal heart sounds and intact distal pulses.   Pulmonary/Chest: Effort normal and breath sounds normal.  Lymphadenopathy:    She has no cervical adenopathy.  Neurological: She is alert and oriented to person, place, and time. No cranial nerve deficit. Gait normal.  Skin: Skin is warm and dry. No rash noted.  Psychiatric: Affect normal.    Recent Results (from the past 2160 hour(s))  RPR     Status: None   Collection Time    01/25/13  3:33 PM      Result Value Range   RPR NON REAC  NON REAC  HIV ANTIBODY (ROUTINE TESTING)     Status: None   Collection Time    01/25/13  3:33 PM      Result Value Range   HIV NON REACTIVE  NON REACTIVE  POCT INFLUENZA A/B     Status: None   Collection Time    03/08/13 10:07 AM      Result Value Range   Influenza A, POC Negative     Influenza B, POC Negative    TROPONIN I     Status: None   Collection Time    03/21/13  9:20 AM      Result Value Range   Troponin I <0.30  <0.30 ng/mL   Comment:            Due to the release kinetics of cTnI,     a negative result within the first hours     of the onset of symptoms does not rule out     myocardial infarction with certainty.     If myocardial infarction is still suspected,     repeat the test at appropriate intervals.  CBC WITH DIFFERENTIAL     Status: Abnormal   Collection Time    03/21/13  9:20 AM      Result Value Range   WBC 5.0  4.0 - 10.5 K/uL   RBC 5.25 (*) 3.87 - 5.11 MIL/uL   Hemoglobin 13.3  12.0 - 15.0 g/dL   HCT 40.8  36.0 - 46.0 %   MCV 77.7 (*) 78.0 - 100.0 fL   MCH 25.3 (*) 26.0 - 34.0 pg   MCHC 32.6  30.0 - 36.0 g/dL   RDW 15.3  11.5 - 15.5 %   Platelets 210  150 - 400 K/uL   Neutrophils  Relative % 75  43 - 77 %   Neutro Abs 3.8  1.7 - 7.7 K/uL   Lymphocytes Relative 17  12 - 46 %  Lymphs Abs 0.9  0.7 - 4.0 K/uL   Monocytes Relative 5  3 - 12 %   Monocytes Absolute 0.3  0.1 - 1.0 K/uL   Eosinophils Relative 2  0 - 5 %   Eosinophils Absolute 0.1  0.0 - 0.7 K/uL   Basophils Relative 0  0 - 1 %   Basophils Absolute 0.0  0.0 - 0.1 K/uL  COMPREHENSIVE METABOLIC PANEL     Status: Abnormal   Collection Time    03/21/13  9:20 AM      Result Value Range   Sodium 139  137 - 147 mEq/L   Potassium 3.7  3.7 - 5.3 mEq/L   Chloride 100  96 - 112 mEq/L   CO2 26  19 - 32 mEq/L   Glucose, Bld 123 (*) 70 - 99 mg/dL   BUN 13  6 - 23 mg/dL   Creatinine, Ser 0.76  0.50 - 1.10 mg/dL   Calcium 8.7  8.4 - 10.5 mg/dL   Total Protein 8.0  6.0 - 8.3 g/dL   Albumin 3.5  3.5 - 5.2 g/dL   AST 21  0 - 37 U/L   ALT 10  0 - 35 U/L   Alkaline Phosphatase 95  39 - 117 U/L   Total Bilirubin 0.8  0.3 - 1.2 mg/dL   GFR calc non Af Amer >90  >90 mL/min   GFR calc Af Amer >90  >90 mL/min   Comment: (NOTE)     The eGFR has been calculated using the CKD EPI equation.     This calculation has not been validated in all clinical situations.     eGFR's persistently <90 mL/min signify possible Chronic Kidney     Disease.  LIPASE, BLOOD     Status: None   Collection Time    03/21/13  9:20 AM      Result Value Range   Lipase 25  11 - 59 U/L  URINALYSIS, ROUTINE W REFLEX MICROSCOPIC     Status: None   Collection Time    03/21/13 10:20 AM      Result Value Range   Color, Urine YELLOW  YELLOW   APPearance CLEAR  CLEAR   Specific Gravity, Urine 1.018  1.005 - 1.030   pH 7.0  5.0 - 8.0   Glucose, UA NEGATIVE  NEGATIVE mg/dL   Hgb urine dipstick NEGATIVE  NEGATIVE   Bilirubin Urine NEGATIVE  NEGATIVE   Ketones, ur NEGATIVE  NEGATIVE mg/dL   Protein, ur NEGATIVE  NEGATIVE mg/dL   Urobilinogen, UA 0.2  0.0 - 1.0 mg/dL   Nitrite NEGATIVE  NEGATIVE   Leukocytes, UA NEGATIVE  NEGATIVE   Comment:  MICROSCOPIC NOT DONE ON URINES WITH NEGATIVE PROTEIN, BLOOD, LEUKOCYTES, NITRITE, OR GLUCOSE <1000 mg/dL.  PREGNANCY, URINE     Status: None   Collection Time    03/21/13 10:20 AM      Result Value Range   Preg Test, Ur NEGATIVE  NEGATIVE   Comment:            THE SENSITIVITY OF THIS     METHODOLOGY IS >20 mIU/mL.  CBC WITH DIFFERENTIAL     Status: Abnormal   Collection Time    03/28/13  3:21 PM      Result Value Range   WBC 6.1  4.0 - 10.5 K/uL   RBC 5.17 (*) 3.87 - 5.11 MIL/uL   Hemoglobin 12.5  12.0 - 15.0 g/dL   HCT 38.3  36.0 -  46.0 %   MCV 74.1 (*) 78.0 - 100.0 fL   MCH 24.2 (*) 26.0 - 34.0 pg   MCHC 32.6  30.0 - 36.0 g/dL   RDW 15.4  11.5 - 15.5 %   Platelets 249  150 - 400 K/uL   Neutrophils Relative % 48  43 - 77 %   Neutro Abs 2.9  1.7 - 7.7 K/uL   Lymphocytes Relative 43  12 - 46 %   Lymphs Abs 2.6  0.7 - 4.0 K/uL   Monocytes Relative 6  3 - 12 %   Monocytes Absolute 0.4  0.1 - 1.0 K/uL   Eosinophils Relative 3  0 - 5 %   Eosinophils Absolute 0.2  0.0 - 0.7 K/uL   Basophils Relative 0  0 - 1 %   Basophils Absolute 0.0  0.0 - 0.1 K/uL   Smear Review Criteria for review not met    BASIC METABOLIC PANEL     Status: Abnormal   Collection Time    03/28/13  3:21 PM      Result Value Range   Sodium 141  135 - 145 mEq/L   Potassium 3.8  3.5 - 5.3 mEq/L   Chloride 101  96 - 112 mEq/L   CO2 32  19 - 32 mEq/L   Glucose, Bld 124 (*) 70 - 99 mg/dL   BUN 13  6 - 23 mg/dL   Creat 0.83  0.50 - 1.10 mg/dL   Calcium 9.5  8.4 - 10.5 mg/dL  TSH     Status: None   Collection Time    03/28/13  3:21 PM      Result Value Range   TSH 1.321  0.350 - 4.500 uIU/mL    Assessment/Plan: No problem-specific assessment & plan notes found for this encounter.

## 2013-03-30 NOTE — Assessment & Plan Note (Signed)
Patient's  Symptoms seem most likely related to stress and anxiety. Will obtain labs to rule out other causes of symptoms. When the care of blood glucose 145. Patient instructed to increase fluid intake. Rest. Take Xanax as prescribed. Avoid skipping meals. Return in one to 2 weeks pending normal labs.

## 2013-04-01 ENCOUNTER — Encounter: Payer: Self-pay | Admitting: Family Medicine

## 2013-04-01 ENCOUNTER — Ambulatory Visit (INDEPENDENT_AMBULATORY_CARE_PROVIDER_SITE_OTHER): Payer: 59 | Admitting: Family Medicine

## 2013-04-01 VITALS — BP 152/98 | HR 79 | Temp 97.6°F | Ht 67.0 in | Wt 322.1 lb

## 2013-04-01 DIAGNOSIS — F32A Depression, unspecified: Secondary | ICD-10-CM

## 2013-04-01 DIAGNOSIS — I10 Essential (primary) hypertension: Secondary | ICD-10-CM

## 2013-04-01 DIAGNOSIS — F419 Anxiety disorder, unspecified: Secondary | ICD-10-CM

## 2013-04-01 DIAGNOSIS — F329 Major depressive disorder, single episode, unspecified: Secondary | ICD-10-CM

## 2013-04-01 DIAGNOSIS — F341 Dysthymic disorder: Secondary | ICD-10-CM

## 2013-04-01 MED ORDER — AMLODIPINE BESYLATE 5 MG PO TABS: 5.0000 mg | ORAL_TABLET | Freq: Every day | ORAL | Status: DC

## 2013-04-01 NOTE — Progress Notes (Signed)
Pre visit review using our clinic review tool, if applicable. No additional management support is needed unless otherwise documented below in the visit note. 

## 2013-04-01 NOTE — Patient Instructions (Signed)

## 2013-04-03 ENCOUNTER — Encounter: Payer: Self-pay | Admitting: Family Medicine

## 2013-04-03 NOTE — Progress Notes (Signed)
Patient ID: Jennifer Rich, female   DOB: [birth year], 48 y.o.   MRN: [id number] Jennifer Rich [id number] 11/02/1965 04/03/2013      Progress Note-Follow Up  Subjective  Chief Complaint  Chief Complaint  Patient presents with  . Follow-up    2 month    HPI  Patient is a 48 year old female in today for followup. Continues to struggle with elevated blood pressure but does note she's improving. She denies headaches, chest pain or palpitations at this time. No shortness or breath GI or GU complaints.  Past Medical History  Diagnosis Date  . Hypertension   . Pneumonia   . Chicken pox as a child  . Hyperlipidemia   . Vitamin D deficiency   . Thyroid disease   . Allergy   . Hyperglycemia   . Preventative health care 12/18/2012    Has seen Dr Carlota Raspberry for GYN in past  . Type II or unspecified type diabetes mellitus without mention of complication, not stated as uncontrolled   . Cervical cancer screening 01/25/2013  . Anxiety and depression 01/30/2013  . Hyperglycemia     Past Surgical History  Procedure Laterality Date  . None    . Wisdom tooth extraction  48 yrs old    Family History  Problem Relation Age of Onset  . Heart attack Maternal Grandfather 75  . Heart disease Maternal Grandfather   . Diabetes Maternal Grandmother     type 2  . Blindness Maternal Grandmother   . Hypertension Paternal Grandmother   . Stroke Paternal Grandfather   . Hypertension Sister   . Hypertension Sister     History   Social History  . Marital Status: Married    Spouse Name: N/A    Number of Children: 0  . Years of Education: N/A   Occupational History  . SALES [employer]    Social History Main Topics  . Smoking status: Never Smoker   . Smokeless tobacco: Never Used  . Alcohol Use: No     Comment: No Pork  . Drug Use: No  . Sexual Activity: Yes    Partners: Male    Birth Control/ Protection: None     Comment: lives with husband and [place], [place]   Other Topics  Concern  . Not on file   Social History Narrative  . No narrative on file    Current Outpatient Prescriptions on File Prior to Visit  Medication Sig Dispense Refill  . acetaminophen (TYLENOL) 500 MG tablet Take 1 tablet (500 mg total) by mouth every 6 (six) hours as needed for mild pain, moderate pain or fever.  30 tablet  0  . ALPRAZolam (XANAX) 0.25 MG tablet Take 1 tablet (0.25 mg total) by mouth 2 (two) times daily as needed for anxiety.  20 tablet  0  . citalopram (CELEXA) 10 MG tablet Take 1 tablet (10 mg total) by mouth daily.  30 tablet  1  . ibuprofen (ADVIL,MOTRIN) 800 MG tablet Take 1 tablet (800 mg total) by mouth every 6 (six) hours as needed for fever, mild pain or moderate pain.  21 tablet  0  . losartan-hydrochlorothiazide (HYZAAR) 100-25 MG per tablet Take 1 tablet by mouth daily.      . Nebivolol HCl (BYSTOLIC) 20 MG TABS Take 1 tablet (20 mg total) by mouth daily.  30 tablet  3  . ondansetron (ZOFRAN ODT) 4 MG disintegrating tablet Take 1 tablet (4 mg total) by mouth every 8 (eight) hours as needed for  nausea or vomiting.  10 tablet  0   No current facility-administered medications on file prior to visit.    No Known Allergies  Review of Systems  Review of Systems  Constitutional: Negative for fever and malaise/fatigue.  HENT: Negative for congestion.   Eyes: Negative for discharge.  Respiratory: Negative for shortness of breath.   Cardiovascular: Negative for chest pain, palpitations and leg swelling.  Gastrointestinal: Negative for nausea, abdominal pain and diarrhea.  Genitourinary: Negative for dysuria.  Musculoskeletal: Negative for falls.  Skin: Negative for rash.  Neurological: Negative for loss of consciousness and headaches.  Endo/Heme/Allergies: Negative for polydipsia.  Psychiatric/Behavioral: Negative for depression and suicidal ideas. The patient is not nervous/anxious and does not have insomnia.     Objective  BP 152/98  Pulse 79  Temp(Src)  97.6 F (36.4 C) (Oral)  Ht 5\' 7"  (1.702 m)  Wt 322 lb 0.8 oz (146.081 kg)  BMI 50.43 kg/m2  SpO2 97%  LMP 03/12/2013  Physical Exam  Physical Exam  Constitutional: She is oriented to person, place, and time and well-developed, well-nourished, and in no distress. No distress.  HENT:  Head: Normocephalic and atraumatic.  Eyes: Conjunctivae are normal.  Neck: Neck supple. No thyromegaly present.  Cardiovascular: Normal rate, regular rhythm and normal heart sounds.   No murmur heard. Pulmonary/Chest: Effort normal and breath sounds normal. She has no wheezes.  Abdominal: She exhibits no distension and no mass.  Musculoskeletal: She exhibits no edema.  Lymphadenopathy:    She has no cervical adenopathy.  Neurological: She is alert and oriented to person, place, and time.  Skin: Skin is warm and dry. No rash noted. She is not diaphoretic.  Psychiatric: Memory, affect and judgment normal.    Lab Results  Component Value Date   TSH 1.321 03/28/2013   Lab Results  Component Value Date   WBC 6.1 03/28/2013   HGB 12.5 03/28/2013   HCT 38.3 03/28/2013   MCV 74.1* 03/28/2013   PLT 249 03/28/2013   Lab Results  Component Value Date   CREATININE 0.83 03/28/2013   BUN 13 03/28/2013   NA 141 03/28/2013   K 3.8 03/28/2013   CL 101 03/28/2013   CO2 32 03/28/2013   Lab Results  Component Value Date   ALT 10 03/21/2013   AST 21 03/21/2013   ALKPHOS 95 03/21/2013   BILITOT 0.8 03/21/2013   Lab Results  Component Value Date   CHOL 210* 12/16/2012   Lab Results  Component Value Date   HDL 51 12/16/2012   Lab Results  Component Value Date   LDLCALC 133* 12/16/2012   Lab Results  Component Value Date   TRIG 132 12/16/2012   Lab Results  Component Value Date   CHOLHDL 4.1 12/16/2012     Assessment & Plan  HTN (hypertension) Still poorly controlled, will add Amlodipine 5 mg dialy, continue other meds and follow DASH diet.   Anxiety and depression Improving on Citalopram and  Alprazolam

## 2013-04-03 NOTE — Assessment & Plan Note (Signed)
Improving on Citalopram and Alprazolam

## 2013-04-03 NOTE — Assessment & Plan Note (Signed)
Still poorly controlled, will add Amlodipine 5 mg dialy, continue other meds and follow DASH diet.

## 2013-04-26 ENCOUNTER — Encounter: Payer: Self-pay | Admitting: Family Medicine

## 2013-04-26 ENCOUNTER — Ambulatory Visit (INDEPENDENT_AMBULATORY_CARE_PROVIDER_SITE_OTHER): Payer: 59 | Admitting: Family Medicine

## 2013-04-26 ENCOUNTER — Telehealth: Payer: Self-pay | Admitting: Family Medicine

## 2013-04-26 VITALS — BP 136/100 | HR 78 | Temp 97.6°F | Ht 67.0 in | Wt 322.1 lb

## 2013-04-26 DIAGNOSIS — E079 Disorder of thyroid, unspecified: Secondary | ICD-10-CM

## 2013-04-26 DIAGNOSIS — F419 Anxiety disorder, unspecified: Secondary | ICD-10-CM

## 2013-04-26 DIAGNOSIS — R7309 Other abnormal glucose: Secondary | ICD-10-CM

## 2013-04-26 DIAGNOSIS — I10 Essential (primary) hypertension: Secondary | ICD-10-CM

## 2013-04-26 DIAGNOSIS — E785 Hyperlipidemia, unspecified: Secondary | ICD-10-CM

## 2013-04-26 DIAGNOSIS — R739 Hyperglycemia, unspecified: Secondary | ICD-10-CM

## 2013-04-26 DIAGNOSIS — F329 Major depressive disorder, single episode, unspecified: Secondary | ICD-10-CM

## 2013-04-26 DIAGNOSIS — F32A Depression, unspecified: Secondary | ICD-10-CM

## 2013-04-26 DIAGNOSIS — E669 Obesity, unspecified: Secondary | ICD-10-CM

## 2013-04-26 DIAGNOSIS — F341 Dysthymic disorder: Secondary | ICD-10-CM

## 2013-04-26 HISTORY — DX: Obesity, unspecified: E66.9

## 2013-04-26 MED ORDER — LOSARTAN POTASSIUM 100 MG PO TABS: 100.0000 mg | ORAL_TABLET | Freq: Every day | ORAL | Status: DC

## 2013-04-26 MED ORDER — TRIAMTERENE-HCTZ 37.5-25 MG PO TABS: 1.0000 | ORAL_TABLET | Freq: Every day | ORAL | Status: DC

## 2013-04-26 NOTE — Assessment & Plan Note (Signed)
Encouraged to minimize carbs and increase exercise

## 2013-04-26 NOTE — Assessment & Plan Note (Signed)
Well controlled on Citalopram, continue the same, not using Alprazolam

## 2013-04-26 NOTE — Telephone Encounter (Signed)
Lab order week of 06-20-2013 Labs prior lipid, renal, cbc, tsh, hepatic, hgba1c

## 2013-04-26 NOTE — Progress Notes (Signed)
Patient ID: Jennifer Rich, female   DOB: October 30, 1965, 48 y.o.   MRN: [id number] Jennifer Rich [id number] 06/15/1965 04/26/2013      Progress Note-Follow Up  Subjective  Chief Complaint  Chief Complaint  Patient presents with  . DMV paperwork    HPI  Patient is 48 year old female who is in today for followup. She feels well. Citalopram has helped stabilize her mood and she no longer needs alprazolam. She denies any significant depression or suicidal ideation. She's had no recent illness. She denies any chest pain, palpitations or shortness of breath. No GI or GU complaints at this time for 2000 in the or. She stopped her amlodipine as a cause some peripheral edema and she otherwise feels well.  Past Medical History  Diagnosis Date  . Hypertension   . Pneumonia   . Chicken pox as a child  . Hyperlipidemia   . Vitamin D deficiency   . Thyroid disease   . Allergy   . Hyperglycemia   . Preventative health care 12/18/2012    Has seen Dr Carlota Raspberry for GYN in past  . Type II or unspecified type diabetes mellitus without mention of complication, not stated as uncontrolled   . Cervical cancer screening 01/25/2013  . Anxiety and depression 01/30/2013  . Hyperglycemia   . Goiter     Mildly enlarged thyroid TSH normal   . Obesity, unspecified 04/26/2013    Past Surgical History  Procedure Laterality Date  . None    . Wisdom tooth extraction  48 yrs old    Family History  Problem Relation Age of Onset  . Heart attack Maternal Grandfather 75  . Heart disease Maternal Grandfather   . Diabetes Maternal Grandmother     type 2  . Blindness Maternal Grandmother   . Hypertension Paternal Grandmother   . Stroke Paternal Grandfather   . Hypertension Sister   . Hypertension Sister     History   Social History  . Marital Status: Married    Spouse Name: N/A    Number of Children: 0  . Years of Education: N/A   Occupational History  . SALES [employer]    Social History  Main Topics  . Smoking status: Never Smoker   . Smokeless tobacco: Never Used  . Alcohol Use: No     Comment: No Pork  . Drug Use: No  . Sexual Activity: Yes    Partners: Male    Birth Control/ Protection: None     Comment: lives with husband and [place], [place]   Other Topics Concern  . Not on file   Social History Narrative  . No narrative on file    Current Outpatient Prescriptions on File Prior to Visit  Medication Sig Dispense Refill  . acetaminophen (TYLENOL) 500 MG tablet Take 1 tablet (500 mg total) by mouth every 6 (six) hours as needed for mild pain, moderate pain or fever.  30 tablet  0  . citalopram (CELEXA) 10 MG tablet Take 1 tablet (10 mg total) by mouth daily.  30 tablet  1  . ibuprofen (ADVIL,MOTRIN) 800 MG tablet Take 1 tablet (800 mg total) by mouth every 6 (six) hours as needed for fever, mild pain or moderate pain.  21 tablet  0  . Nebivolol HCl (BYSTOLIC) 20 MG TABS Take 1 tablet (20 mg total) by mouth daily.  30 tablet  3  . ondansetron (ZOFRAN ODT) 4 MG disintegrating tablet Take 1 tablet (4 mg total) by mouth every 8 (  eight) hours as needed for nausea or vomiting.  10 tablet  0  . amLODipine (NORVASC) 5 MG tablet Take 1 tablet (5 mg total) by mouth daily.  30 tablet  3   No current facility-administered medications on file prior to visit.    No Known Allergies  Review of Systems  Review of Systems  Constitutional: Negative for fever and malaise/fatigue.  HENT: Negative for congestion.   Eyes: Negative for discharge.  Respiratory: Negative for shortness of breath.   Cardiovascular: Negative for chest pain, palpitations and leg swelling.  Gastrointestinal: Negative for nausea, abdominal pain and diarrhea.  Genitourinary: Negative for dysuria.  Musculoskeletal: Negative for falls.  Skin: Negative for rash.  Neurological: Negative for loss of consciousness and headaches.  Endo/Heme/Allergies: Negative for polydipsia.  Psychiatric/Behavioral:  Negative for depression and suicidal ideas. The patient is not nervous/anxious and does not have insomnia.     Objective  BP 136/100  Pulse 78  Temp(Src) 97.6 F (36.4 C) (Oral)  Ht 5\' 7"  (1.702 m)  Wt 322 lb 1.3 oz (146.095 kg)  BMI 50.43 kg/m2  SpO2 95%  LMP 04/12/2013  Physical Exam  Physical Exam  Constitutional: She is oriented to person, place, and time and well-developed, well-nourished, and in no distress. No distress.  HENT:  Head: Normocephalic and atraumatic.  Eyes: Conjunctivae are normal.  Neck: Neck supple. No thyromegaly present.  Cardiovascular: Normal rate, regular rhythm and normal heart sounds.   No murmur heard. Pulmonary/Chest: Effort normal and breath sounds normal. She has no wheezes.  Abdominal: She exhibits no distension and no mass.  Musculoskeletal: She exhibits no edema.  Lymphadenopathy:    She has no cervical adenopathy.  Neurological: She is alert and oriented to person, place, and time.  Skin: Skin is warm and dry. No rash noted. She is not diaphoretic.  Psychiatric: Memory, affect and judgment normal.    Lab Results  Component Value Date   TSH 1.321 03/28/2013   Lab Results  Component Value Date   WBC 6.1 03/28/2013   HGB 12.5 03/28/2013   HCT 38.3 03/28/2013   MCV 74.1* 03/28/2013   PLT 249 03/28/2013   Lab Results  Component Value Date   CREATININE 0.83 03/28/2013   BUN 13 03/28/2013   NA 141 03/28/2013   K 3.8 03/28/2013   CL 101 03/28/2013   CO2 32 03/28/2013   Lab Results  Component Value Date   ALT 10 03/21/2013   AST 21 03/21/2013   ALKPHOS 95 03/21/2013   BILITOT 0.8 03/21/2013   Lab Results  Component Value Date   CHOL 210* 12/16/2012   Lab Results  Component Value Date   HDL 51 12/16/2012   Lab Results  Component Value Date   LDLCALC 133* 12/16/2012   Lab Results  Component Value Date   TRIG 132 12/16/2012   Lab Results  Component Value Date   CHOLHDL 4.1 12/16/2012     Assessment & Plan  HTN  (hypertension) Improved on recheck but will switch to plain Losartan and Maxzide, encouraged DASH diet and miinimize caffeine  Anxiety and depression Well controlled on Citalopram, continue the same, not using Alprazolam  Hyperglycemia Encouraged to minimize carbs and increase exercise

## 2013-04-26 NOTE — Assessment & Plan Note (Signed)
>>  ASSESSMENT AND PLAN FOR HYPERGLYCEMIA WRITTEN ON 04/26/2013  9:07 PM BY BLYTH, STACEY A, MD  Encouraged to minimize carbs and increase exercise

## 2013-04-26 NOTE — Telephone Encounter (Signed)
Lab order placed.

## 2013-04-26 NOTE — Patient Instructions (Signed)

## 2013-04-26 NOTE — Assessment & Plan Note (Addendum)
Improved on recheck but will switch to plain Losartan and Maxzide, encouraged DASH diet and miinimize caffeine

## 2013-04-26 NOTE — Progress Notes (Signed)
Pre visit review using our clinic review tool, if applicable. No additional management support is needed unless otherwise documented below in the visit note. 

## 2013-04-27 ENCOUNTER — Telehealth: Payer: Self-pay | Admitting: Family Medicine

## 2013-04-27 NOTE — Telephone Encounter (Signed)
Relevant patient education assigned to patient using Emmi. ° °

## 2013-05-02 ENCOUNTER — Encounter: Payer: Self-pay | Admitting: Physician Assistant

## 2013-05-02 ENCOUNTER — Ambulatory Visit (INDEPENDENT_AMBULATORY_CARE_PROVIDER_SITE_OTHER): Payer: 59 | Admitting: Physician Assistant

## 2013-05-02 VITALS — BP 154/100 | HR 85 | Temp 98.3°F | Resp 18 | Ht 67.0 in | Wt 322.1 lb

## 2013-05-02 DIAGNOSIS — J189 Pneumonia, unspecified organism: Secondary | ICD-10-CM | POA: Insufficient documentation

## 2013-05-02 DIAGNOSIS — J209 Acute bronchitis, unspecified: Secondary | ICD-10-CM

## 2013-05-02 DIAGNOSIS — J309 Allergic rhinitis, unspecified: Secondary | ICD-10-CM

## 2013-05-02 DIAGNOSIS — I10 Essential (primary) hypertension: Secondary | ICD-10-CM

## 2013-05-02 HISTORY — DX: Pneumonia, unspecified organism: J18.9

## 2013-05-02 MED ORDER — HYDROCOD POLST-CHLORPHEN POLST 10-8 MG/5ML PO LQCR: 5.0000 mL | Freq: Two times a day (BID) | ORAL | Status: DC | PRN

## 2013-05-02 MED ORDER — ALBUTEROL SULFATE HFA 108 (90 BASE) MCG/ACT IN AERS: 2.0000 | INHALATION_SPRAY | Freq: Four times a day (QID) | RESPIRATORY_TRACT | Status: DC | PRN

## 2013-05-02 MED ORDER — SULFAMETHOXAZOLE-TRIMETHOPRIM 800-160 MG PO TABS: 1.0000 | ORAL_TABLET | Freq: Two times a day (BID) | ORAL | Status: DC

## 2013-05-02 MED ORDER — FLUTICASONE PROPIONATE 50 MCG/ACT NA SUSP: 2.0000 | Freq: Every day | NASAL | Status: DC

## 2013-05-02 NOTE — Assessment & Plan Note (Signed)
Rx Bactrim.  Rx Flonase.  Rx Tussionex.  Increase fluid intake.  Rest.  Saline nasal spray. Probiotic.  Plain Mucinex. Humidifier in bedroom. Call or return to clinic if symptoms persist or acutely worsen.

## 2013-05-02 NOTE — Assessment & Plan Note (Signed)
BP remains elevated.  Patient taking all of her medications just prior to bed due to increased urination with Maxxide.  Patient instructed to take Maxxide in PM and other medication in the morning.  Follow-up with PCP at next scheduled visit (2-3 weeks) for BP recheck.

## 2013-05-02 NOTE — Patient Instructions (Signed)
Increase fluid intake.  Rest.  Take medications, including antibiotic as directed.  Use saline nasal spray. Place a humidifier in the bedroom.  Use plain Mucinex.  Please call if symptoms are not improving.  Bronchitis Bronchitis is inflammation of the airways that extend from the windpipe into the lungs (bronchi). The inflammation often causes mucus to develop, which leads to a cough. If the inflammation becomes severe, it may cause shortness of breath. CAUSES  Bronchitis may be caused by:   Viral infections.   Bacteria.   Cigarette smoke.   Allergens, pollutants, and other irritants.  SIGNS AND SYMPTOMS  The most common symptom of bronchitis is a frequent cough that produces mucus. Other symptoms include:  Fever.   Body aches.   Chest congestion.   Chills.   Shortness of breath.   Sore throat.  DIAGNOSIS  Bronchitis is usually diagnosed through a medical history and physical exam. Tests, such as chest X-rays, are sometimes done to rule out other conditions.  TREATMENT  You may need to avoid contact with whatever caused the problem (smoking, for example). Medicines are sometimes needed. These may include:  Antibiotics. These may be prescribed if the condition is caused by bacteria.  Cough suppressants. These may be prescribed for relief of cough symptoms.   Inhaled medicines. These may be prescribed to help open your airways and make it easier for you to breathe.   Steroid medicines. These may be prescribed for those with recurrent (chronic) bronchitis. HOME CARE INSTRUCTIONS  Get plenty of rest.   Drink enough fluids to keep your urine clear or pale yellow (unless you have a medical condition that requires fluid restriction). Increasing fluids may help thin your secretions and will prevent dehydration.   Only take over-the-counter or prescription medicines as directed by your health care provider.  Only take antibiotics as directed. Make sure you finish  them even if you start to feel better.  Avoid secondhand smoke, irritating chemicals, and strong fumes. These will make bronchitis worse. If you are a smoker, quit smoking. Consider using nicotine gum or skin patches to help control withdrawal symptoms. Quitting smoking will help your lungs heal faster.   Put a cool-mist humidifier in your bedroom at night to moisten the air. This may help loosen mucus. Change the water in the humidifier daily. You can also run the hot water in your shower and sit in the bathroom with the door closed for 5 10 minutes.   Follow up with your health care provider as directed.   Wash your hands frequently to avoid catching bronchitis again or spreading an infection to others.  SEEK MEDICAL CARE IF: Your symptoms do not improve after 1 week of treatment.  SEEK IMMEDIATE MEDICAL CARE IF:  Your fever increases.  You have chills.   You have chest pain.   You have worsening shortness of breath.   You have bloody sputum.  You faint.  You have lightheadedness.  You have a severe headache.   You vomit repeatedly. MAKE SURE YOU:   Understand these instructions.  Will watch your condition.  Will get help right away if you are not doing well or get worse. Document Released: 02/17/2005 Document Revised: 12/08/2012 Document Reviewed: 10/12/2012 [facility] Patient Information 2014 [place].

## 2013-05-02 NOTE — Progress Notes (Signed)
Patient presents to clinic today c/o several days of chest congestion, cough productive of green-brown sputum, mild shortness of breath and fatigue. Symptoms first noticed > 1 week ago.  Patient states her symptoms have acutely worsened since yesterday evening.  Patient is unsure of fever.  Denies chills, aches.  Denies sinus symptoms.  Denies pleuritic chest pain.  Denies recent travel or sick contact.  Past Medical History  Diagnosis Date  . Hypertension   . Pneumonia   . Chicken pox as a child  . Hyperlipidemia   . Vitamin D deficiency   . Thyroid disease   . Allergy   . Hyperglycemia   . Preventative health care 12/18/2012    Has seen Dr Carlota Raspberry for GYN in past  . Type II or unspecified type diabetes mellitus without mention of complication, not stated as uncontrolled   . Cervical cancer screening 01/25/2013  . Anxiety and depression 01/30/2013  . Hyperglycemia   . Goiter     Mildly enlarged thyroid TSH normal   . Obesity, unspecified 04/26/2013    Current Outpatient Prescriptions on File Prior to Visit  Medication Sig Dispense Refill  . acetaminophen (TYLENOL) 500 MG tablet Take 1 tablet (500 mg total) by mouth every 6 (six) hours as needed for mild pain, moderate pain or fever.  30 tablet  0  . amLODipine (NORVASC) 5 MG tablet Take 1 tablet (5 mg total) by mouth daily.  30 tablet  3  . citalopram (CELEXA) 10 MG tablet Take 1 tablet (10 mg total) by mouth daily.  30 tablet  1  . ibuprofen (ADVIL,MOTRIN) 800 MG tablet Take 1 tablet (800 mg total) by mouth every 6 (six) hours as needed for fever, mild pain or moderate pain.  21 tablet  0  . losartan (COZAAR) 100 MG tablet Take 1 tablet (100 mg total) by mouth daily.  30 tablet  3  . Nebivolol HCl (BYSTOLIC) 20 MG TABS Take 1 tablet (20 mg total) by mouth daily.  30 tablet  3  . ondansetron (ZOFRAN ODT) 4 MG disintegrating tablet Take 1 tablet (4 mg total) by mouth every 8 (eight) hours as needed for nausea or vomiting.  10 tablet  0   . triamterene-hydrochlorothiazide (MAXZIDE-25) 37.5-25 MG per tablet Take 1 tablet by mouth daily.  30 tablet  3   No current facility-administered medications on file prior to visit.    No Known Allergies  Family History  Problem Relation Age of Onset  . Heart attack Maternal Grandfather 75  . Heart disease Maternal Grandfather   . Diabetes Maternal Grandmother     type 2  . Blindness Maternal Grandmother   . Hypertension Paternal Grandmother   . Stroke Paternal Grandfather   . Hypertension Sister   . Hypertension Sister     History   Social History  . Marital Status: Married    Spouse Name: N/A    Number of Children: 0  . Years of Education: N/A   Occupational History  . SAL[employer]    Social History Main Topics  . Smoking status: Never Smoker   . Smokeless tobacco: Never Used  . Alcohol Use: No     Comment: No Pork  . Drug Use: No  . Sexual Activity: Yes    Partners: Male    Birth Control/ Protection: None     Comment: lives with husband and [place], [place]   Other Topics Concern  . None   Social History Narrative  . None  Review of Systems - See HPI.  All other ROS are negative.  BP 154/100  Pulse 85  Temp(Src) 98.3 F (36.8 C) (Oral)  Resp 18  Ht $R'5\' 7"'Vb$  (1.702 m)  Wt 322 lb 2 oz (146.115 kg)  BMI 50.44 kg/m2  SpO2 97%  LMP 04/12/2013  Physical Exam  Vitals reviewed. Constitutional: She is oriented to person, place, and time and well-developed, well-nourished, and in no distress.  HENT:  Head: Normocephalic and atraumatic.  Right Ear: External ear normal.  Left Ear: External ear normal.  Nose: Nose normal.  Mouth/Throat: Oropharynx is clear and moist. No oropharyngeal exudate.  TM within normal limits bilaterally.  No TTP of sinuses.  Eyes: Conjunctivae are normal. Pupils are equal, round, and reactive to light.  Neck: Neck supple.  Cardiovascular: Normal rate, regular rhythm, normal heart sounds and intact distal pulses.    Pulmonary/Chest: Effort normal and breath sounds normal. No respiratory distress. She has no wheezes. She has no rales. She exhibits no tenderness.  Lymphadenopathy:    She has no cervical adenopathy.  Neurological: She is alert and oriented to person, place, and time.  Skin: Skin is warm and dry. No rash noted.  Psychiatric: Affect normal.    Recent Results (from the past 2160 hour(s))  POCT INFLUENZA A/B     Status: None   Collection Time    03/08/13 10:07 AM      Result Value Ref Range   Influenza A, POC Negative     Influenza B, POC Negative    TROPONIN I     Status: None   Collection Time    03/21/13  9:20 AM      Result Value Ref Range   Troponin I <0.30  <0.30 ng/mL   Comment:            Due to the release kinetics of cTnI,     a negative result within the first hours     of the onset of symptoms does not rule out     myocardial infarction with certainty.     If myocardial infarction is still suspected,     repeat the test at appropriate intervals.  CBC WITH DIFFERENTIAL     Status: Abnormal   Collection Time    03/21/13  9:20 AM      Result Value Ref Range   WBC 5.0  4.0 - 10.5 K/uL   RBC 5.25 (*) 3.87 - 5.11 MIL/uL   Hemoglobin 13.3  12.0 - 15.0 g/dL   HCT 40.8  36.0 - 46.0 %   MCV 77.7 (*) 78.0 - 100.0 fL   MCH 25.3 (*) 26.0 - 34.0 pg   MCHC 32.6  30.0 - 36.0 g/dL   RDW 15.3  11.5 - 15.5 %   Platelets 210  150 - 400 K/uL   Neutrophils Relative % 75  43 - 77 %   Neutro Abs 3.8  1.7 - 7.7 K/uL   Lymphocytes Relative 17  12 - 46 %   Lymphs Abs 0.9  0.7 - 4.0 K/uL   Monocytes Relative 5  3 - 12 %   Monocytes Absolute 0.3  0.1 - 1.0 K/uL   Eosinophils Relative 2  0 - 5 %   Eosinophils Absolute 0.1  0.0 - 0.7 K/uL   Basophils Relative 0  0 - 1 %   Basophils Absolute 0.0  0.0 - 0.1 K/uL  COMPREHENSIVE METABOLIC PANEL     Status: Abnormal   Collection Time  03/21/13  9:20 AM      Result Value Ref Range   Sodium 139  137 - 147 mEq/L   Potassium 3.7  3.7 -  5.3 mEq/L   Chloride 100  96 - 112 mEq/L   CO2 26  19 - 32 mEq/L   Glucose, Bld 123 (*) 70 - 99 mg/dL   BUN 13  6 - 23 mg/dL   Creatinine, Ser 2.71  0.50 - 1.10 mg/dL   Calcium 8.7  8.4 - 04.1 mg/dL   Total Protein 8.0  6.0 - 8.3 g/dL   Albumin 3.5  3.5 - 5.2 g/dL   AST 21  0 - 37 U/L   ALT 10  0 - 35 U/L   Alkaline Phosphatase 95  39 - 117 U/L   Total Bilirubin 0.8  0.3 - 1.2 mg/dL   GFR calc non Af Amer >90  >90 mL/min   GFR calc Af Amer >90  >90 mL/min   Comment: (NOTE)     The eGFR has been calculated using the CKD EPI equation.     This calculation has not been validated in all clinical situations.     eGFR's persistently <90 mL/min signify possible Chronic Kidney     Disease.  LIPASE, BLOOD     Status: None   Collection Time    03/21/13  9:20 AM      Result Value Ref Range   Lipase 25  11 - 59 U/L  URINALYSIS, ROUTINE W REFLEX MICROSCOPIC     Status: None   Collection Time    03/21/13 10:20 AM      Result Value Ref Range   Color, Urine YELLOW  YELLOW   APPearance CLEAR  CLEAR   Specific Gravity, Urine 1.018  1.005 - 1.030   pH 7.0  5.0 - 8.0   Glucose, UA NEGATIVE  NEGATIVE mg/dL   Hgb urine dipstick NEGATIVE  NEGATIVE   Bilirubin Urine NEGATIVE  NEGATIVE   Ketones, ur NEGATIVE  NEGATIVE mg/dL   Protein, ur NEGATIVE  NEGATIVE mg/dL   Urobilinogen, UA 0.2  0.0 - 1.0 mg/dL   Nitrite NEGATIVE  NEGATIVE   Leukocytes, UA NEGATIVE  NEGATIVE   Comment: MICROSCOPIC NOT DONE ON URINES WITH NEGATIVE PROTEIN, BLOOD, LEUKOCYTES, NITRITE, OR GLUCOSE <1000 mg/dL.  PREGNANCY, URINE     Status: None   Collection Time    03/21/13 10:20 AM      Result Value Ref Range   Preg Test, Ur NEGATIVE  NEGATIVE   Comment:            THE SENSITIVITY OF THIS     METHODOLOGY IS >20 mIU/mL.  CBC WITH DIFFERENTIAL     Status: Abnormal   Collection Time    03/28/13  3:21 PM      Result Value Ref Range   WBC 6.1  4.0 - 10.5 K/uL   RBC 5.17 (*) 3.87 - 5.11 MIL/uL   Hemoglobin 12.5  12.0 -  15.0 g/dL   HCT 41.2  05.5 - 87.9 %   MCV 74.1 (*) 78.0 - 100.0 fL   MCH 24.2 (*) 26.0 - 34.0 pg   MCHC 32.6  30.0 - 36.0 g/dL   RDW 78.4  91.4 - 76.0 %   Platelets 249  150 - 400 K/uL   Neutrophils Relative % 48  43 - 77 %   Neutro Abs 2.9  1.7 - 7.7 K/uL   Lymphocytes Relative 43  12 - 46 %  Lymphs Abs 2.6  0.7 - 4.0 K/uL   Monocytes Relative 6  3 - 12 %   Monocytes Absolute 0.4  0.1 - 1.0 K/uL   Eosinophils Relative 3  0 - 5 %   Eosinophils Absolute 0.2  0.0 - 0.7 K/uL   Basophils Relative 0  0 - 1 %   Basophils Absolute 0.0  0.0 - 0.1 K/uL   Smear Review Criteria for review not met    BASIC METABOLIC PANEL     Status: Abnormal   Collection Time    03/28/13  3:21 PM      Result Value Ref Range   Sodium 141  135 - 145 mEq/L   Potassium 3.8  3.5 - 5.3 mEq/L   Chloride 101  96 - 112 mEq/L   CO2 32  19 - 32 mEq/L   Glucose, Bld 124 (*) 70 - 99 mg/dL   BUN 13  6 - 23 mg/dL   Creat 0.83  0.50 - 1.10 mg/dL   Calcium 9.5  8.4 - 10.5 mg/dL  TSH     Status: None   Collection Time    03/28/13  3:21 PM      Result Value Ref Range   TSH 1.321  0.350 - 4.500 uIU/mL    Assessment/Plan: Acute bronchitis Rx Bactrim.  Rx Flonase.  Rx Tussionex.  Increase fluid intake.  Rest.  Saline nasal spray. Probiotic.  Plain Mucinex. Humidifier in bedroom. Call or return to clinic if symptoms persist or acutely worsen.  HTN (hypertension) BP remains elevated.  Patient taking all of her medications just prior to bed due to increased urination with Maxxide.  Patient instructed to take Maxxide in PM and other medication in the morning.  Follow-up with PCP at next scheduled visit (2-3 weeks) for BP recheck.

## 2013-05-02 NOTE — Progress Notes (Signed)
Pre visit review using our clinic review tool, if applicable. No additional management support is needed unless otherwise documented below in the visit note/SLS  

## 2013-05-03 ENCOUNTER — Telehealth: Payer: Self-pay | Admitting: Family Medicine

## 2013-05-03 NOTE — Telephone Encounter (Signed)
Relevant patient education assigned to patient using Emmi. ° °

## 2013-05-04 ENCOUNTER — Emergency Department (HOSPITAL_COMMUNITY): Payer: 59

## 2013-05-04 ENCOUNTER — Encounter (HOSPITAL_COMMUNITY): Payer: Self-pay | Admitting: Emergency Medicine

## 2013-05-04 ENCOUNTER — Emergency Department (HOSPITAL_COMMUNITY)
Admission: EM | Admit: 2013-05-04 | Discharge: 2013-05-05 | Disposition: A | Discharge status: Home / Self Care | Payer: 59 | Attending: Emergency Medicine | Admitting: Emergency Medicine

## 2013-05-04 DIAGNOSIS — J180 Bronchopneumonia, unspecified organism: Secondary | ICD-10-CM | POA: Insufficient documentation

## 2013-05-04 DIAGNOSIS — E669 Obesity, unspecified: Secondary | ICD-10-CM | POA: Insufficient documentation

## 2013-05-04 DIAGNOSIS — I10 Essential (primary) hypertension: Secondary | ICD-10-CM | POA: Insufficient documentation

## 2013-05-04 DIAGNOSIS — Z8619 Personal history of other infectious and parasitic diseases: Secondary | ICD-10-CM | POA: Insufficient documentation

## 2013-05-04 DIAGNOSIS — Z8639 Personal history of other endocrine, nutritional and metabolic disease: Secondary | ICD-10-CM | POA: Insufficient documentation

## 2013-05-04 DIAGNOSIS — Z79899 Other long term (current) drug therapy: Secondary | ICD-10-CM | POA: Insufficient documentation

## 2013-05-04 DIAGNOSIS — E119 Type 2 diabetes mellitus without complications: Secondary | ICD-10-CM | POA: Insufficient documentation

## 2013-05-04 LAB — BASIC METABOLIC PANEL
BUN: 13 mg/dL (ref 6–23)
CALCIUM: 8.8 mg/dL (ref 8.4–10.5)
CO2: 21 mEq/L (ref 19–32)
Chloride: 101 mEq/L (ref 96–112)
Creatinine, Ser: 0.82 mg/dL (ref 0.50–1.10)
GFR calc Af Amer: >90 mL/min (ref >90)
GFR, EST NON AFRICAN AMERICAN: 84 mL/min — AB (ref >90)
GLUCOSE: 99 mg/dL (ref 70–99)
Potassium: 4 mEq/L (ref 3.7–5.3)
SODIUM: 137 meq/L (ref 137–147)

## 2013-05-04 LAB — CBC
HCT: 41.2 % (ref 36.0–46.0)
Hemoglobin: 13.9 g/dL (ref 12.0–15.0)
MCH: 25.8 pg — AB (ref 26.0–34.0)
MCHC: 33.7 g/dL (ref 30.0–36.0)
MCV: 76.4 fL — ABNORMAL LOW (ref 78.0–100.0)
PLATELETS: 186 10*3/uL (ref 150–400)
RBC: 5.39 MIL/uL — ABNORMAL HIGH (ref 3.87–5.11)
RDW: 15.4 % (ref 11.5–15.5)
WBC: 5.3 10*3/uL (ref 4.0–10.5)

## 2013-05-04 LAB — I-STAT TROPONIN, ED: TROPONIN I, POC: 0 ng/mL (ref 0.00–0.08)

## 2013-05-04 LAB — PRO B NATRIURETIC PEPTIDE: PRO B NATRI PEPTIDE: 29.4 pg/mL (ref 0–125)

## 2013-05-04 NOTE — ED Provider Notes (Signed)
CSN: [id number]     Arrival date & time 05/04/13  1837 History   First MD Initiated Contact with Patient 05/04/13 2320     Chief Complaint  Patient presents with  . Chest Pain  . Cough     (Consider location/radiation/quality/duration/timing/severity/associated sxs/prior Treatment) HPI Comments: 48 year old female, she is obese, hypertensive, recent history of pneumonia in January of this year. She presents with persistent shortness of breath, cough and a brief episode of chest pain that occurred earlier in the day. She is currently taking Bactrim after being seen by her family doctor and treated with Bactrim. This does not seem to be helping, it has given her a use infection and has not improved her cough which is gradually worsening, nonproductive, not associated with swelling of the lower extremities or fevers.  The history is provided by the patient, medical records and a relative.    Past Medical History  Diagnosis Date  . Hypertension   . Pneumonia   . Chicken pox as a child  . Hyperlipidemia   . Vitamin D deficiency   . Thyroid disease   . Allergy   . Hyperglycemia   . Preventative health care 12/18/2012    Has seen Dr Carlota Raspberry for GYN in past  . Type II or unspecified type diabetes mellitus without mention of complication, not stated as uncontrolled   . Cervical cancer screening 01/25/2013  . Anxiety and depression 01/30/2013  . Hyperglycemia   . Goiter     Mildly enlarged thyroid TSH normal   . Obesity, unspecified 04/26/2013   Past Surgical History  Procedure Laterality Date  . None    . Wisdom tooth extraction  48 yrs old   Family History  Problem Relation Age of Onset  . Heart attack Maternal Grandfather 75  . Heart disease Maternal Grandfather   . Diabetes Maternal Grandmother     type 2  . Blindness Maternal Grandmother   . Hypertension Paternal Grandmother   . Stroke Paternal Grandfather   . Hypertension Sister   . Hypertension Sister    History   Substance Use Topics  . Smoking status: Never Smoker   . Smokeless tobacco: Never Used  . Alcohol Use: No     Comment: No Pork   OB History   Grav Para Term Preterm Abortions TAB SAB Ect Mult Living                 Review of Systems  All other systems reviewed and are negative.      Allergies  Review of patient's allergies indicates no known allergies.  Home Medications   Current Outpatient Rx  Name  Route  Sig  Dispense  Refill  . acetaminophen (TYLENOL) 500 MG tablet   Oral   Take 1 tablet (500 mg total) by mouth every 6 (six) hours as needed for mild pain, moderate pain or fever.   30 tablet   0   . albuterol (PROVENTIL HFA;VENTOLIN HFA) 108 (90 BASE) MCG/ACT inhaler   Inhalation   Inhale 2 puffs into the lungs every 6 (six) hours as needed for wheezing or shortness of breath.   1 Inhaler   0   . amLODipine (NORVASC) 5 MG tablet   Oral   Take 1 tablet (5 mg total) by mouth daily.   30 tablet   3   . ibuprofen (ADVIL,MOTRIN) 800 MG tablet   Oral   Take 1 tablet (800 mg total) by mouth every 6 (six) hours as needed  for fever, mild pain or moderate pain.   21 tablet   0   . losartan (COZAAR) 100 MG tablet   Oral   Take 1 tablet (100 mg total) by mouth daily.   30 tablet   3   . Nebivolol HCl (BYSTOLIC) 20 MG TABS   Oral   Take 1 tablet (20 mg total) by mouth daily.   30 tablet   3   . triamterene-hydrochlorothiazide (MAXZIDE-25) 37.5-25 MG per tablet   Oral   Take 1 tablet by mouth daily.   30 tablet   3   . benzonatate (TESSALON) 100 MG capsule   Oral   Take 1 capsule (100 mg total) by mouth every 8 (eight) hours.   21 capsule   0   . fluconazole (DIFLUCAN) 150 MG tablet   Oral   Take 1 tablet (150 mg total) by mouth once.   1 tablet   1   . levofloxacin (LEVAQUIN) 750 MG tablet   Oral   Take 1 tablet (750 mg total) by mouth daily.   7 tablet   0    BP 147/93  Pulse 93  Temp(Src) 99.4 F (37.4 C)  Resp 22  SpO2 96%  LMP  04/12/2013 Physical Exam  Nursing note and vitals reviewed. Constitutional: She appears well-developed and well-nourished. No distress.  HENT:  Head: Normocephalic and atraumatic.  Mouth/Throat: Oropharynx is clear and moist. No oropharyngeal exudate.  Oropharynx is clear and moist, no exudate asymmetry hypertrophy or erythema  Eyes: Conjunctivae and EOM are normal. Pupils are equal, round, and reactive to light. Right eye exhibits no discharge. Left eye exhibits no discharge. No scleral icterus.  Neck: Normal range of motion. Neck supple. No JVD present. No thyromegaly present.  Cardiovascular: Normal rate, regular rhythm, normal heart sounds and intact distal pulses.  Exam reveals no gallop and no friction rub.   No murmur heard. Pulmonary/Chest: Effort normal. No respiratory distress. She has wheezes ( Diffuse mild expiratory wheezing, occasional rhonchi). She has no rales.  Speaks in full sentences, in no increased work of breathing, no accessory muscle use.  Abdominal: Soft. Bowel sounds are normal. She exhibits no distension and no mass. There is no tenderness.  Musculoskeletal: Normal range of motion. She exhibits no edema and no tenderness.  No lower extremity edema or asymmetry  Lymphadenopathy:    She has no cervical adenopathy.  Neurological: She is alert. Coordination normal.  Skin: Skin is warm and dry. No rash noted. No erythema.  Psychiatric: She has a normal mood and affect. Her behavior is normal.    ED Course  Procedures (including critical care time) Labs Review Labs Reviewed  BASIC METABOLIC PANEL - Abnormal; Notable for the following:    GFR calc non Af Amer 84 (*)    All other components within normal limits  CBC - Abnormal; Notable for the following:    RBC 5.39 (*)    MCV 76.4 (*)    MCH 25.8 (*)    All other components within normal limits  PRO B NATRIURETIC PEPTIDE  I-STAT TROPOININ, ED   Imaging Review Dg Chest 2 View  05/04/2013   CLINICAL DATA:   Cough.  Chest pain.  Wheezing.  EXAM: CHEST  2 VIEW  COMPARISON:  03/21/13  FINDINGS: A new focus airspace opacity is seen in right midlung which was not seen on recent study, and is suspicious for an area of developing infiltrate. Left lung remains clear. Low lung volumes again seen.  No evidence of pleural effusion. Heart size remains within normal limits.  IMPRESSION: New focus of airspace opacity in the right mid lung, suspicious for developing infiltrate/pneumonia. Recommend short-term radiographic followup in several weeks to confirm resolution.   Electronically Signed   By: Earle Gell M.D.   On: 05/04/2013 19:55     EKG Interpretation   Date/Time:  Wednesday May 04 2013 18:41:13 EST Ventricular Rate:  104 PR Interval:  140 QRS Duration: 82 QT Interval:  356 QTC Calculation: 468 R Axis:   88 Text Interpretation:  Sinus tachycardia Nonspecific T wave abnormality  Abnormal ECG since last tracing no significant change Confirmed by Alexzandra Bilton   MD, Beverley Allender ([id number]) on 05/04/2013 11:32:06 PM      MDM   Final diagnoses:  Bronchopneumonia    The patient has a chest x-ray which I have personally interpreted, two-view, PA and lateral views which shows a focal right middle lobe infiltrate which is small. There is no signs of pneumothorax, pleural fluid, abnormal mediastinum or other significant abnormalities. Laboratory workup is normal with no leukocytosis, no anemia, normal electrolytes. EKG with a mild sinus tachycardia, nonspecific T waves, and troponin is unremarkable.  With the patient's persistent cough, wheezing on exam and vital signs which are relatively normal, the patient likely has an ongoing bronchitis however on the chest x-ray there is evidence of a new infiltrate. I will change her antibiotic from Bactrim to Levaquin, we'll treat the yeast infection with Diflucan and will have her respiratory status with a metered-dose inhaler with a spacer. She will receive teaching prior to  discharge. She has expressed her understanding to the indications for return.  On my exam the patient had no epoxy a, oxygen of 99-100% on room air without difficulty. She is stable for discharge.  Meds given in ED:  Medications  levofloxacin (LEVAQUIN) IVPB 750 mg (not administered)  albuterol (PROVENTIL HFA;VENTOLIN HFA) 108 (90 BASE) MCG/ACT inhaler 2 puff (not administered)    New Prescriptions   BENZONATATE (TESSALON) 100 MG CAPSULE    Take 1 capsule (100 mg total) by mouth every 8 (eight) hours.   FLUCONAZOLE (DIFLUCAN) 150 MG TABLET    Take 1 tablet (150 mg total) by mouth once.   LEVOFLOXACIN (LEVAQUIN) 750 MG TABLET    Take 1 tablet (750 mg total) by mouth daily.      Johnna Acosta, MD 05/05/13 Dyann Kief

## 2013-05-04 NOTE — ED Notes (Signed)
Per pt sts worsening chest congestion and SOB over the past few days. sts she was recently dx with PNA a few months ago. sts the pain comes and goes. sts chest pain when she lays down.

## 2013-05-05 MED ORDER — ALBUTEROL SULFATE HFA 108 (90 BASE) MCG/ACT IN AERS
2.0000 | INHALATION_SPRAY | RESPIRATORY_TRACT | Status: AC
  Administered 2013-05-05: 2 via RESPIRATORY_TRACT
  Filled 2013-05-05: qty 6.7

## 2013-05-05 MED ORDER — LEVOFLOXACIN 750 MG PO TABS
750.0000 mg | ORAL_TABLET | Freq: Once | ORAL | Status: AC
  Administered 2013-05-05: 750 mg via ORAL
  Filled 2013-05-05: qty 1

## 2013-05-05 MED ORDER — LEVOFLOXACIN 750 MG PO TABS: 750.0000 mg | ORAL_TABLET | Freq: Every day | ORAL | Status: DC

## 2013-05-05 MED ORDER — BENZONATATE 100 MG PO CAPS: 100.0000 mg | ORAL_CAPSULE | Freq: Three times a day (TID) | ORAL | Status: DC

## 2013-05-05 MED ORDER — LEVOFLOXACIN IN D5W 750 MG/150ML IV SOLN: 750.0000 mg | Freq: Once | INTRAVENOUS | Status: DC

## 2013-05-05 MED ORDER — ALBUTEROL SULFATE HFA 108 (90 BASE) MCG/ACT IN AERS: 2.0000 | INHALATION_SPRAY | RESPIRATORY_TRACT | Status: DC | PRN

## 2013-05-05 MED ORDER — FLUCONAZOLE 150 MG PO TABS: 150.0000 mg | ORAL_TABLET | Freq: Once | ORAL | Status: DC

## 2013-05-05 NOTE — ED Notes (Signed)
Discharge inst given  Voiced understanding

## 2013-05-06 ENCOUNTER — Ambulatory Visit (INDEPENDENT_AMBULATORY_CARE_PROVIDER_SITE_OTHER): Payer: 59 | Admitting: Family Medicine

## 2013-05-06 ENCOUNTER — Ambulatory Visit: Payer: 59 | Admitting: Family Medicine

## 2013-05-06 ENCOUNTER — Encounter: Payer: Self-pay | Admitting: Family Medicine

## 2013-05-06 VITALS — BP 132/100 | HR 79 | Temp 97.7°F | Ht 67.0 in | Wt 318.1 lb

## 2013-05-06 DIAGNOSIS — I10 Essential (primary) hypertension: Secondary | ICD-10-CM

## 2013-05-06 DIAGNOSIS — J189 Pneumonia, unspecified organism: Secondary | ICD-10-CM

## 2013-05-06 MED ORDER — METHYLPREDNISOLONE ACETATE 40 MG/ML IJ SUSP
40.0000 mg | Freq: Once | INTRAMUSCULAR | Status: AC
  Administered 2013-05-06: 40 mg via INTRAMUSCULAR

## 2013-05-06 MED ORDER — METHYLPREDNISOLONE (PAK) 4 MG PO TABS: ORAL_TABLET | ORAL | Status: DC

## 2013-05-06 NOTE — Patient Instructions (Signed)
Mucinex 600 mg twice daily x 10 days Probiotics daily  Pneumonia, Adult Pneumonia is an infection of the lungs.  CAUSES Pneumonia may be caused by bacteria or a virus. Usually, these infections are caused by breathing infectious particles into the lungs (respiratory tract). SYMPTOMS   Cough.  Fever.  Chest pain.  Increased rate of breathing.  Wheezing.  Mucus production. DIAGNOSIS  If you have the common symptoms of pneumonia, your caregiver will typically confirm the diagnosis with a chest X-ray. The X-ray will show an abnormality in the lung (pulmonary infiltrate) if you have pneumonia. Other tests of your blood, urine, or sputum may be done to find the specific cause of your pneumonia. Your caregiver may also do tests (blood gases or pulse oximetry) to see how well your lungs are working. TREATMENT  Some forms of pneumonia may be spread to other people when you cough or sneeze. You may be asked to wear a mask before and during your exam. Pneumonia that is caused by bacteria is treated with antibiotic medicine. Pneumonia that is caused by the influenza virus may be treated with an antiviral medicine. Most other viral infections must run their course. These infections will not respond to antibiotics.  PREVENTION A pneumococcal shot (vaccine) is available to prevent a common bacterial cause of pneumonia. This is usually suggested for:  People over 45 years old.  Patients on chemotherapy.  People with chronic lung problems, such as bronchitis or emphysema.  People with immune system problems. If you are over 65 or have a high risk condition, you may receive the pneumococcal vaccine if you have not received it before. In some countries, a routine influenza vaccine is also recommended. This vaccine can help prevent some cases of pneumonia.You may be offered the influenza vaccine as part of your care. If you smoke, it is time to quit. You may receive instructions on how to stop  smoking. Your caregiver can provide medicines and counseling to help you quit. HOME CARE INSTRUCTIONS   Cough suppressants may be used if you are losing too much rest. However, coughing protects you by clearing your lungs. You should avoid using cough suppressants if you can.  Your caregiver may have prescribed medicine if he or she thinks your pneumonia is caused by a bacteria or influenza. Finish your medicine even if you start to feel better.  Your caregiver may also prescribe an expectorant. This loosens the mucus to be coughed up.  Only take over-the-counter or prescription medicines for pain, discomfort, or fever as directed by your caregiver.  Do not smoke. Smoking is a common cause of bronchitis and can contribute to pneumonia. If you are a smoker and continue to smoke, your cough may last several weeks after your pneumonia has cleared.  A cold steam vaporizer or humidifier in your room or home may help loosen mucus.  Coughing is often worse at night. Sleeping in a semi-upright position in a recliner or using a couple pillows under your head will help with this.  Get rest as you feel it is needed. Your body will usually let you know when you need to rest. SEEK IMMEDIATE MEDICAL CARE IF:   Your illness becomes worse. This is especially true if you are elderly or weakened from any other disease.  You cannot control your cough with suppressants and are losing sleep.  You begin coughing up blood.  You develop pain which is getting worse or is uncontrolled with medicines.  You have a fever.  Any of the symptoms which initially brought you in for treatment are getting worse rather than better.  You develop shortness of breath or chest pain. MAKE SURE YOU:   Understand these instructions.  Will watch your condition.  Will get help right away if you are not doing well or get worse. Document Released: 02/17/2005 Document Revised: 05/12/2011 Document Reviewed:  05/09/2010 [facility] Patient Information 2014 [place], [place].

## 2013-05-06 NOTE — Progress Notes (Signed)
Pre visit review using our clinic review tool, if applicable. No additional management support is needed unless otherwise documented below in the visit note. 

## 2013-05-08 ENCOUNTER — Encounter: Payer: Self-pay | Admitting: Family Medicine

## 2013-05-08 NOTE — Assessment & Plan Note (Signed)
Mild elevation today with acute illness. No changes today

## 2013-05-08 NOTE — Progress Notes (Signed)
Patient ID: Jennifer Rich, female   DOB: 1965-08-25, 48 y.o.   MRN: [id number] SHARDAY MICHL [id number] 09/03/1965 05/08/2013      Progress Note-Follow Up  Subjective  Chief Complaint  Chief Complaint  Patient presents with  . Pneumonia    diagnosed at [facility] yesterday    HPI  Patient is a 33 him and he was recently diagnosed with pneumonia. She did not tolerate is tolerating. She continues to have wheezing coughing and shortness of breath. No fevers or chills. No chest pain or palpitations. No GI or GU concerns. He is struggling with fatigue, malaise and myalgias.  Past Medical History  Diagnosis Date  . Hypertension   . Pneumonia   . Chicken pox as a child  . Hyperlipidemia   . Vitamin D deficiency   . Thyroid disease   . Allergy   . Hyperglycemia   . Preventative health care 12/18/2012    Has seen Dr Carlota Raspberry for GYN in past  . Type II or unspecified type diabetes mellitus without mention of complication, not stated as uncontrolled   . Cervical cancer screening 01/25/2013  . Anxiety and depression 01/30/2013  . Hyperglycemia   . Goiter     Mildly enlarged thyroid TSH normal   . Obesity, unspecified 04/26/2013  . Pneumonia 05/02/2013    Past Surgical History  Procedure Laterality Date  . None    . Wisdom tooth extraction  48 yrs old    Family History  Problem Relation Age of Onset  . Heart attack Maternal Grandfather 75  . Heart disease Maternal Grandfather   . Diabetes Maternal Grandmother     type 2  . Blindness Maternal Grandmother   . Hypertension Paternal Grandmother   . Stroke Paternal Grandfather   . Hypertension Sister   . Hypertension Sister     History   Social History  . Marital Status: Married    Spouse Name: N/A    Number of Children: 0  . Years of Education: N/A   Occupational History  . SALES [employer]    Social History Main Topics  . Smoking status: Never Smoker   . Smokeless tobacco: Never Used  . Alcohol Use: No   Comment: No Pork  . Drug Use: No  . Sexual Activity: Yes    Partners: Male    Birth Control/ Protection: None     Comment: lives with husband and [place], [place]   Other Topics Concern  . Not on file   Social History Narrative  . No narrative on file    Current Outpatient Prescriptions on File Prior to Visit  Medication Sig Dispense Refill  . acetaminophen (TYLENOL) 500 MG tablet Take 1 tablet (500 mg total) by mouth every 6 (six) hours as needed for mild pain, moderate pain or fever.  30 tablet  0  . albuterol (PROVENTIL HFA;VENTOLIN HFA) 108 (90 BASE) MCG/ACT inhaler Inhale 2 puffs into the lungs every 6 (six) hours as needed for wheezing or shortness of breath.  1 Inhaler  0  . amLODipine (NORVASC) 5 MG tablet Take 1 tablet (5 mg total) by mouth daily.  30 tablet  3  . benzonatate (TESSALON) 100 MG capsule Take 1 capsule (100 mg total) by mouth every 8 (eight) hours.  21 capsule  0  . fluconazole (DIFLUCAN) 150 MG tablet Take 1 tablet (150 mg total) by mouth once.  1 tablet  1  . ibuprofen (ADVIL,MOTRIN) 800 MG tablet Take 1 tablet (800 mg total) by  mouth every 6 (six) hours as needed for fever, mild pain or moderate pain.  21 tablet  0  . levofloxacin (LEVAQUIN) 750 MG tablet Take 1 tablet (750 mg total) by mouth daily.  7 tablet  0  . losartan (COZAAR) 100 MG tablet Take 1 tablet (100 mg total) by mouth daily.  30 tablet  3  . Nebivolol HCl (BYSTOLIC) 20 MG TABS Take 1 tablet (20 mg total) by mouth daily.  30 tablet  3  . triamterene-hydrochlorothiazide (MAXZIDE-25) 37.5-25 MG per tablet Take 1 tablet by mouth daily.  30 tablet  3   No current facility-administered medications on file prior to visit.    No Known Allergies  Review of Systems  Review of Systems  Constitutional: Negative for fever and malaise/fatigue.  HENT: Positive for congestion. Negative for sore throat.   Eyes: Negative for discharge.  Respiratory: Positive for cough, sputum production and shortness of  breath.   Cardiovascular: Negative for chest pain, palpitations and leg swelling.  Gastrointestinal: Negative for nausea, abdominal pain and diarrhea.  Genitourinary: Negative for dysuria.  Musculoskeletal: Negative for falls.  Skin: Negative for rash.  Neurological: Negative for loss of consciousness and headaches.  Endo/Heme/Allergies: Negative for polydipsia.  Psychiatric/Behavioral: Negative for depression and suicidal ideas. The patient is not nervous/anxious and does not have insomnia.     Objective  BP 132/100  Pulse 79  Temp(Src) 97.7 F (36.5 C) (Oral)  Ht 5\' 7"  (1.702 m)  Wt 318 lb 1.3 oz (144.28 kg)  BMI 49.81 kg/m2  SpO2 97%  LMP 04/12/2013  Physical Exam  Physical Exam  Constitutional: She is oriented to person, place, and time and well-developed, well-nourished, and in no distress. No distress.  HENT:  Head: Normocephalic and atraumatic.  Eyes: Conjunctivae are normal.  Neck: Neck supple. No thyromegaly present.  Cardiovascular: Normal rate, regular rhythm and normal heart sounds.   No murmur heard. Pulmonary/Chest: Effort normal. She has wheezes. She has rales.  Abdominal: She exhibits no distension and no mass.  Genitourinary: Guaiac negative stool.  Musculoskeletal: She exhibits no edema.  Lymphadenopathy:    She has no cervical adenopathy.  Neurological: She is alert and oriented to person, place, and time.  Skin: Skin is warm and dry. No rash noted. She is not diaphoretic.  Psychiatric: Memory, affect and judgment normal.    Lab Results  Component Value Date   TSH 1.321 03/28/2013   Lab Results  Component Value Date   WBC 5.3 05/04/2013   HGB 13.9 05/04/2013   HCT 41.2 05/04/2013   MCV 76.4* 05/04/2013   PLT 186 05/04/2013   Lab Results  Component Value Date   CREATININE 0.82 05/04/2013   BUN 13 05/04/2013   NA 137 05/04/2013   K 4.0 05/04/2013   CL 101 05/04/2013   CO2 21 05/04/2013   Lab Results  Component Value Date   ALT 10 03/21/2013   AST 21  03/21/2013   ALKPHOS 95 03/21/2013   BILITOT 0.8 03/21/2013   Lab Results  Component Value Date   CHOL 210* 12/16/2012   Lab Results  Component Value Date   HDL 51 12/16/2012   Lab Results  Component Value Date   LDLCALC 133* 12/16/2012   Lab Results  Component Value Date   TRIG 132 12/16/2012   Lab Results  Component Value Date   CHOLHDL 4.1 12/16/2012     Assessment & Plan  HTN (hypertension) Mild elevation today with acute illness. No changes today  Pneumonia Wheezing still today, given shot of Depo medrol and then started on dosepak, maintain hydration, rest and probiotics, mucinex. Return if worsens. Finish antibiotics

## 2013-05-08 NOTE — Assessment & Plan Note (Signed)
Wheezing still today, given shot of Depo medrol and then started on dosepak, maintain hydration, rest and probiotics, mucinex. Return if worsens. Finish antibiotics

## 2013-05-16 ENCOUNTER — Telehealth: Payer: Self-pay | Admitting: Family Medicine

## 2013-05-16 NOTE — Telephone Encounter (Signed)
Paperwork is in the back scan pile to be taken up front for patient to pick up. Please inform pt that this is ready

## 2013-05-16 NOTE — Telephone Encounter (Signed)
Patient called in wanting to know the status of her FMLA paperwork

## 2013-05-17 NOTE — Telephone Encounter (Signed)
Patient picked up paperwork.

## 2013-05-23 ENCOUNTER — Ambulatory Visit (INDEPENDENT_AMBULATORY_CARE_PROVIDER_SITE_OTHER): Payer: 59 | Admitting: Family Medicine

## 2013-05-23 ENCOUNTER — Encounter: Payer: Self-pay | Admitting: Family Medicine

## 2013-05-23 VITALS — BP 124/84 | HR 77 | Temp 97.7°F | Ht 67.0 in | Wt 319.1 lb

## 2013-05-23 DIAGNOSIS — J209 Acute bronchitis, unspecified: Secondary | ICD-10-CM

## 2013-05-23 DIAGNOSIS — I10 Essential (primary) hypertension: Secondary | ICD-10-CM

## 2013-05-23 DIAGNOSIS — E669 Obesity, unspecified: Secondary | ICD-10-CM

## 2013-05-23 DIAGNOSIS — J189 Pneumonia, unspecified organism: Secondary | ICD-10-CM

## 2013-05-23 MED ORDER — TRIAMTERENE-HCTZ 37.5-25 MG PO TABS: 1.0000 | ORAL_TABLET | Freq: Every morning | ORAL | Status: DC

## 2013-05-23 MED ORDER — ALBUTEROL SULFATE HFA 108 (90 BASE) MCG/ACT IN AERS: 2.0000 | INHALATION_SPRAY | Freq: Four times a day (QID) | RESPIRATORY_TRACT | Status: DC | PRN

## 2013-05-23 MED ORDER — ACETAMINOPHEN-CODEINE 120-12 MG/5ML PO SUSP: 5.0000 mL | Freq: Three times a day (TID) | ORAL | Status: DC | PRN

## 2013-05-23 NOTE — Progress Notes (Signed)
Pre visit review using our clinic review tool, if applicable. No additional management support is needed unless otherwise documented below in the visit note. 

## 2013-05-23 NOTE — Patient Instructions (Signed)

## 2013-05-24 ENCOUNTER — Telehealth: Payer: Self-pay

## 2013-05-24 NOTE — Telephone Encounter (Signed)
FYI:  Pt left a message stating that we should be getting some paperwork from [facility] for pts STD-leave. Pt states this is for her pneumonia from 05-02-13 through 05-30-13

## 2013-05-26 ENCOUNTER — Telehealth: Payer: Self-pay | Admitting: *Deleted

## 2013-05-26 NOTE — Telephone Encounter (Signed)
Have you seen this paperwork?

## 2013-05-26 NOTE — Telephone Encounter (Signed)
Patient called to confirm that disability paperwork was received from [place]. Please advise. JG//CMA

## 2013-05-27 NOTE — Telephone Encounter (Signed)
I have her note for you

## 2013-05-28 ENCOUNTER — Encounter: Payer: Self-pay | Admitting: Family Medicine

## 2013-05-28 NOTE — Assessment & Plan Note (Signed)
Well controlled, no changes to meds. Encouraged heart healthy diet such as the DASH diet and exercise as tolerated.  °

## 2013-05-28 NOTE — Progress Notes (Signed)
Patient ID: [name], female   DOB: 02-Dec-1965, 48 y.o.   MRN: [id number] GEORGIANA SPILLANE [id number] Feb 18, 1966 05/28/2013      Progress Note-Follow Up  Subjective  Chief Complaint  Chief Complaint  Patient presents with  . Follow-up    2 week     HPI  Patient is a 48 year old female in today for routine medical care. Patient is in today for followup. Her pneumonia is improving. She still struggles with fatigue and cough that is greatly improved. She has frequent episodes of feeling weak and lightheaded still. No fevers or chills. No sputum production. No headache, chest pain or palpitations. No shortness or breath GI or GU complaints at this time. He  Past Medical History  Diagnosis Date  . Hypertension   . Pneumonia   . Chicken pox as a child  . Hyperlipidemia   . Vitamin D deficiency   . Thyroid disease   . Allergy   . Hyperglycemia   . Preventative health care 12/18/2012    Has seen Dr Carlota Raspberry for GYN in past  . Type II or unspecified type diabetes mellitus without mention of complication, not stated as uncontrolled   . Cervical cancer screening 01/25/2013  . Anxiety and depression 01/30/2013  . Hyperglycemia   . Goiter     Mildly enlarged thyroid TSH normal   . Obesity, unspecified 04/26/2013  . Pneumonia 05/02/2013    Past Surgical History  Procedure Laterality Date  . None    . Wisdom tooth extraction  48 yrs old    Family History  Problem Relation Age of Onset  . Heart attack Maternal Grandfather 75  . Heart disease Maternal Grandfather   . Diabetes Maternal Grandmother     type 2  . Blindness Maternal Grandmother   . Hypertension Paternal Grandmother   . Stroke Paternal Grandfather   . Hypertension Sister   . Hypertension Sister     History   Social History  . Marital Status: Married    Spouse Name: N/A    Number of Children: 0  . Years of Education: N/A   Occupational History  . SALES [employer]    Social History Main Topics  .  Smoking status: Never Smoker   . Smokeless tobacco: Never Used  . Alcohol Use: No     Comment: No Pork  . Drug Use: No  . Sexual Activity: Yes    Partners: Male    Birth Control/ Protection: None     Comment: lives with husband and [place], [place]   Other Topics Concern  . Not on file   Social History Narrative  . No narrative on file    Current Outpatient Prescriptions on File Prior to Visit  Medication Sig Dispense Refill  . acetaminophen (TYLENOL) 500 MG tablet Take 1 tablet (500 mg total) by mouth every 6 (six) hours as needed for mild pain, moderate pain or fever.  30 tablet  0  . amLODipine (NORVASC) 5 MG tablet Take 1 tablet (5 mg total) by mouth daily.  30 tablet  3  . ibuprofen (ADVIL,MOTRIN) 800 MG tablet Take 1 tablet (800 mg total) by mouth every 6 (six) hours as needed for fever, mild pain or moderate pain.  21 tablet  0  . levofloxacin (LEVAQUIN) 750 MG tablet Take 1 tablet (750 mg total) by mouth daily.  7 tablet  0  . losartan (COZAAR) 100 MG tablet Take 1 tablet (100 mg total) by mouth daily.  [place]  tablet  3  . Nebivolol HCl (BYSTOLIC) 20 MG TABS Take 1 tablet (20 mg total) by mouth daily.  30 tablet  3   No current facility-administered medications on file prior to visit.    No Known Allergies  Review of Systems  Review of Systems  Constitutional: Negative for fever and malaise/fatigue.  HENT: Positive for congestion.   Eyes: Negative for discharge.  Respiratory: Positive for cough. Negative for shortness of breath.   Cardiovascular: Negative for chest pain, palpitations and leg swelling.  Gastrointestinal: Negative for nausea, abdominal pain and diarrhea.  Genitourinary: Negative for dysuria.  Musculoskeletal: Negative for falls.  Skin: Negative for rash.  Neurological: Negative for loss of consciousness and headaches.  Endo/Heme/Allergies: Negative for polydipsia.  Psychiatric/Behavioral: Negative for depression and suicidal ideas. The patient is not  nervous/anxious and does not have insomnia.     Objective  BP 124/84  Pulse 77  Temp(Src) 97.7 F (36.5 C) (Oral)  Ht 5\' 7"  (1.702 m)  Wt 319 lb 1.3 oz (144.734 kg)  BMI 49.96 kg/m2  SpO2 95%  LMP 05/20/2013  Physical Exam  Physical Exam  Constitutional: She is oriented to person, place, and time and well-developed, well-nourished, and in no distress. No distress.  HENT:  Head: Normocephalic and atraumatic.  Eyes: Conjunctivae are normal.  Neck: Neck supple. No thyromegaly present.  Cardiovascular: Normal rate, regular rhythm and normal heart sounds.   No murmur heard. Pulmonary/Chest: Effort normal and breath sounds normal. She has no wheezes.  Abdominal: She exhibits no distension and no mass.  Musculoskeletal: She exhibits no edema.  Lymphadenopathy:    She has no cervical adenopathy.  Neurological: She is alert and oriented to person, place, and time.  Skin: Skin is warm and dry. No rash noted. She is not diaphoretic.  Psychiatric: Memory, affect and judgment normal.    Lab Results  Component Value Date   TSH 1.321 03/28/2013   Lab Results  Component Value Date   WBC 5.3 05/04/2013   HGB 13.9 05/04/2013   HCT 41.2 05/04/2013   MCV 76.4* 05/04/2013   PLT 186 05/04/2013   Lab Results  Component Value Date   CREATININE 0.82 05/04/2013   BUN 13 05/04/2013   NA 137 05/04/2013   K 4.0 05/04/2013   CL 101 05/04/2013   CO2 21 05/04/2013   Lab Results  Component Value Date   ALT 10 03/21/2013   AST 21 03/21/2013   ALKPHOS 95 03/21/2013   BILITOT 0.8 03/21/2013   Lab Results  Component Value Date   CHOL 210* 12/16/2012   Lab Results  Component Value Date   HDL 51 12/16/2012   Lab Results  Component Value Date   LDLCALC 133* 12/16/2012   Lab Results  Component Value Date   TRIG 132 12/16/2012   Lab Results  Component Value Date   CHOLHDL 4.1 12/16/2012     Assessment & Plan  Pneumonia Improving. Almost ready to return to work. Cough is still present but  improving, still experiencing some light headedness but no falls or vertigo, encouraged adequate hydration and rest. Forms completed for FMLA  HTN (hypertension) Well controlled, no changes to meds. Encouraged heart healthy diet such as the DASH diet and exercise as tolerated.   Obesity, unspecified Encouraged DASH diet, decrease po intake and increase exercise as tolerated. Needs 7-8 hours of sleep nightly. Avoid trans fats, eat small, frequent meals every 4-5 hours with lean proteins, complex carbs and healthy fats. Minimize simple carbs,  GMO foods.

## 2013-05-28 NOTE — Assessment & Plan Note (Signed)
Improving. Almost ready to return to work. Cough is still present but improving, still experiencing some light headedness but no falls or vertigo, encouraged adequate hydration and rest. Forms completed for [facility]

## 2013-05-28 NOTE — Assessment & Plan Note (Signed)
Encouraged DASH diet, decrease po intake and increase exercise as tolerated. Needs 7-8 hours of sleep nightly. Avoid trans fats, eat small, frequent meals every 4-5 hours with lean proteins, complex carbs and healthy fats. Minimize simple carbs, GMO foods. 

## 2013-06-12 ENCOUNTER — Encounter (HOSPITAL_BASED_OUTPATIENT_CLINIC_OR_DEPARTMENT_OTHER): Payer: Self-pay | Admitting: Emergency Medicine

## 2013-06-12 ENCOUNTER — Emergency Department (HOSPITAL_BASED_OUTPATIENT_CLINIC_OR_DEPARTMENT_OTHER)
Admission: EM | Admit: 2013-06-12 | Discharge: 2013-06-12 | Disposition: A | Discharge status: Home / Self Care | Payer: 59 | Attending: Emergency Medicine | Admitting: Emergency Medicine

## 2013-06-12 DIAGNOSIS — E119 Type 2 diabetes mellitus without complications: Secondary | ICD-10-CM | POA: Insufficient documentation

## 2013-06-12 DIAGNOSIS — Z862 Personal history of diseases of the blood and blood-forming organs and certain disorders involving the immune mechanism: Secondary | ICD-10-CM | POA: Insufficient documentation

## 2013-06-12 DIAGNOSIS — Z8639 Personal history of other endocrine, nutritional and metabolic disease: Secondary | ICD-10-CM | POA: Insufficient documentation

## 2013-06-12 DIAGNOSIS — Z8701 Personal history of pneumonia (recurrent): Secondary | ICD-10-CM | POA: Insufficient documentation

## 2013-06-12 DIAGNOSIS — I10 Essential (primary) hypertension: Secondary | ICD-10-CM | POA: Insufficient documentation

## 2013-06-12 DIAGNOSIS — R112 Nausea with vomiting, unspecified: Secondary | ICD-10-CM | POA: Insufficient documentation

## 2013-06-12 DIAGNOSIS — Z8659 Personal history of other mental and behavioral disorders: Secondary | ICD-10-CM | POA: Insufficient documentation

## 2013-06-12 DIAGNOSIS — R197 Diarrhea, unspecified: Secondary | ICD-10-CM | POA: Insufficient documentation

## 2013-06-12 DIAGNOSIS — E669 Obesity, unspecified: Secondary | ICD-10-CM | POA: Insufficient documentation

## 2013-06-12 DIAGNOSIS — R109 Unspecified abdominal pain: Secondary | ICD-10-CM | POA: Insufficient documentation

## 2013-06-12 DIAGNOSIS — Z79899 Other long term (current) drug therapy: Secondary | ICD-10-CM | POA: Insufficient documentation

## 2013-06-12 DIAGNOSIS — Z792 Long term (current) use of antibiotics: Secondary | ICD-10-CM | POA: Insufficient documentation

## 2013-06-12 DIAGNOSIS — Z8619 Personal history of other infectious and parasitic diseases: Secondary | ICD-10-CM | POA: Insufficient documentation

## 2013-06-12 MED ORDER — ONDANSETRON HCL 4 MG PO TABS: 4.0000 mg | ORAL_TABLET | Freq: Four times a day (QID) | ORAL | Status: DC

## 2013-06-12 MED ORDER — DIPHENOXYLATE-ATROPINE 2.5-0.025 MG PO TABS: 2.0000 | ORAL_TABLET | Freq: Four times a day (QID) | ORAL | Status: DC | PRN

## 2013-06-12 MED ORDER — DIPHENOXYLATE-ATROPINE 2.5-0.025 MG PO TABS
2.0000 | ORAL_TABLET | Freq: Once | ORAL | Status: AC
Start: 2013-06-12 — End: 2013-06-12
  Administered 2013-06-12: 2 via ORAL
  Filled 2013-06-12: qty 2

## 2013-06-12 MED ORDER — SODIUM CHLORIDE 0.9 % IV BOLUS (SEPSIS): 1000.0000 mL | Freq: Once | INTRAVENOUS | Status: DC

## 2013-06-12 NOTE — Discharge Instructions (Signed)
Diarrhea Diarrhea is frequent loose and watery bowel movements. It can cause you to feel weak and dehydrated. Dehydration can cause you to become tired and thirsty, have a dry mouth, and have decreased urination that often is dark yellow. Diarrhea is a sign of another problem, most often an infection that will not last long. In most cases, diarrhea typically lasts 2 3 days. However, it can last longer if it is a sign of something more serious. It is important to treat your diarrhea as directed by your caregive to lessen or prevent future episodes of diarrhea. CAUSES  Some common causes include:  Gastrointestinal infections caused by viruses, bacteria, or parasites.  Food poisoning or food allergies.  Certain medicines, such as antibiotics, chemotherapy, and laxatives.  Artificial sweeteners and fructose.  Digestive disorders. HOME CARE INSTRUCTIONS  Ensure adequate fluid intake (hydration): have 1 cup (8 oz) of fluid for each diarrhea episode. Avoid fluids that contain simple sugars or sports drinks, fruit juices, whole milk products, and sodas. Your urine should be clear or pale yellow if you are drinking enough fluids. Hydrate with an oral rehydration solution that you can purchase at pharmacies, retail stores, and online. You can prepare an oral rehydration solution at home by mixing the following ingredients together:    tsp table salt.   tsp baking soda.   tsp salt substitute containing potassium chloride.  1  tablespoons sugar.  1 L (34 oz) of water.  Certain foods and beverages may increase the speed at which food moves through the gastrointestinal (GI) tract. These foods and beverages should be avoided and include:  Caffeinated and alcoholic beverages.  High-fiber foods, such as raw fruits and vegetables, nuts, seeds, and whole grain breads and cereals.  Foods and beverages sweetened with sugar alcohols, such as xylitol, sorbitol, and mannitol.  Some foods may be well  tolerated and may help thicken stool including:  Starchy foods, such as rice, toast, pasta, low-sugar cereal, oatmeal, grits, baked potatoes, crackers, and bagels.  Bananas.  Applesauce.  Add probiotic-rich foods to help increase healthy bacteria in the GI tract, such as yogurt and fermented milk products.  Wash your hands well after each diarrhea episode.  Only take over-the-counter or prescription medicines as directed by your caregiver.  Take a warm bath to relieve any burning or pain from frequent diarrhea episodes. SEEK IMMEDIATE MEDICAL CARE IF:   You are unable to keep fluids down.  You have persistent vomiting.  You have blood in your stool, or your stools are black and tarry.  You do not urinate in 6 8 hours, or there is only a small amount of very dark urine.  You have abdominal pain that increases or localizes.  You have weakness, dizziness, confusion, or lightheadedness.  You have a severe headache.  Your diarrhea gets worse or does not get better.  You have a fever or persistent symptoms for more than 2 3 days.  You have a fever and your symptoms suddenly get worse. MAKE SURE YOU:   Understand these instructions.  Will watch your condition.  Will get help right away if you are not doing well or get worse. Document Released: 02/07/2002 Document Revised: 02/04/2012 Document Reviewed: 10/26/2011 [employer] Patient Information 2014 [employer].  

## 2013-06-12 NOTE — ED Notes (Signed)
Reports diarrhea since Wednesday.  Vomited x one day initially when symptoms started.  Now just c/o diarrhea and abdominal cramping.  Denies fever.

## 2013-06-12 NOTE — ED Notes (Signed)
IV insertion attempt x three, unable to gain access to right AC x two attempts, left hand x one attempts.  Successful catheter introduction into the vein, but it would blow upon flushing.  20 gauge x 2 right AC, 22 gauge to left hand.  Patient was agreeable to all three attempts.

## 2013-06-12 NOTE — ED Provider Notes (Signed)
Medical screening examination/treatment/procedure(s) were performed by non-physician practitioner and as supervising physician I was immediately available for consultation/collaboration.   EKG Interpretation None        Osvaldo Shipper, MD 06/12/13 1537

## 2013-06-12 NOTE — ED Provider Notes (Signed)
CSN: [id number]     Arrival date & time 06/12/13  1324 History   First MD Initiated Contact with Patient 06/12/13 1337     Chief Complaint  Patient presents with  . Diarrhea     (Consider location/radiation/quality/duration/timing/severity/associated sxs/prior Treatment) Patient is a 48 y.o. female presenting with diarrhea. The history is provided by the patient. No language interpreter was used.  Diarrhea Quality:  Watery Associated symptoms: abdominal pain   Associated symptoms: no chills, no fever and no vomiting   Associated symptoms comment:  Symptoms started 3 days ago with one episode of nausea and vomiting. She started having loose stool the next day that has increased in number progressively. Nonboloody diarrhea "every 30 minutes" for the past 24 hours. No fever. She has cramping abdominal pain periodically. No pain currently.   Past Medical History  Diagnosis Date  . Hypertension   . Pneumonia   . Chicken pox as a child  . Hyperlipidemia   . Vitamin D deficiency   . Thyroid disease   . Allergy   . Hyperglycemia   . Preventative health care 12/18/2012    Has seen Dr Carlota Raspberry for GYN in past  . Type II or unspecified type diabetes mellitus without mention of complication, not stated as uncontrolled   . Cervical cancer screening 01/25/2013  . Anxiety and depression 01/30/2013  . Hyperglycemia   . Goiter     Mildly enlarged thyroid TSH normal   . Obesity, unspecified 04/26/2013  . Pneumonia 05/02/2013   Past Surgical History  Procedure Laterality Date  . None    . Wisdom tooth extraction  48 yrs old   Family History  Problem Relation Age of Onset  . Heart attack Maternal Grandfather 75  . Heart disease Maternal Grandfather   . Diabetes Maternal Grandmother     type 2  . Blindness Maternal Grandmother   . Hypertension Paternal Grandmother   . Stroke Paternal Grandfather   . Hypertension Sister   . Hypertension Sister    History  Substance Use Topics  .  Smoking status: Never Smoker   . Smokeless tobacco: Never Used  . Alcohol Use: No     Comment: No Pork   OB History   Grav Para Term Preterm Abortions TAB SAB Ect Mult Living                 Review of Systems  Constitutional: Negative for fever and chills.  Respiratory: Negative.  Negative for shortness of breath.   Cardiovascular: Negative.   Gastrointestinal: Positive for abdominal pain and diarrhea. Negative for vomiting.  Genitourinary: Negative.  Negative for dysuria.  Musculoskeletal: Negative.   Skin: Negative.   Neurological: Negative.       Allergies  Review of patient's allergies indicates no known allergies.  Home Medications   Current Outpatient Rx  Name  Route  Sig  Dispense  Refill  . acetaminophen (TYLENOL) 500 MG tablet   Oral   Take 1 tablet (500 mg total) by mouth every 6 (six) hours as needed for mild pain, moderate pain or fever.   30 tablet   0   . acetaminophen-codeine (CAPITAL/CODEINE) 120-12 MG/5ML suspension   Oral   Take 5 mLs by mouth every 8 (eight) hours as needed for pain (cough). Causes sedation use at bedtime   240 mL   0   . albuterol (PROVENTIL HFA;VENTOLIN HFA) 108 (90 BASE) MCG/ACT inhaler   Inhalation   Inhale 2 puffs into the lungs every 6 (  six) hours as needed for wheezing or shortness of breath (prior to exertion).   1 Inhaler   01   . amLODipine (NORVASC) 5 MG tablet   Oral   Take 1 tablet (5 mg total) by mouth daily.   30 tablet   3   . ibuprofen (ADVIL,MOTRIN) 800 MG tablet   Oral   Take 1 tablet (800 mg total) by mouth every 6 (six) hours as needed for fever, mild pain or moderate pain.   21 tablet   0   . levofloxacin (LEVAQUIN) 750 MG tablet   Oral   Take 1 tablet (750 mg total) by mouth daily.   7 tablet   0   . losartan (COZAAR) 100 MG tablet   Oral   Take 1 tablet (100 mg total) by mouth daily.   30 tablet   3   . Nebivolol HCl (BYSTOLIC) 20 MG TABS   Oral   Take 1 tablet (20 mg total) by mouth  daily.   30 tablet   3   . triamterene-hydrochlorothiazide (MAXZIDE-25) 37.5-25 MG per tablet   Oral   Take 1 tablet by mouth every morning.   30 tablet   0    BP 180/105  Pulse 78  Temp(Src) 98.5 F (36.9 C) (Oral)  Resp 18  Ht 5\' 7"  (1.702 m)  Wt 315 lb (142.883 kg)  BMI 49.32 kg/m2  SpO2 98%  LMP 05/20/2013 Physical Exam  Constitutional: She is oriented to person, place, and time. She appears well-developed and well-nourished.  HENT:  Head: Normocephalic.  Mouth/Throat: Oropharynx is clear and moist.  Neck: Normal range of motion. Neck supple.  Cardiovascular: Normal rate and regular rhythm.   Pulmonary/Chest: Effort normal and breath sounds normal.  Abdominal: Soft. Bowel sounds are normal. There is no tenderness. There is no rebound and no guarding.  Musculoskeletal: Normal range of motion.  Neurological: She is alert and oriented to person, place, and time.  Skin: Skin is warm and dry. No rash noted.  Psychiatric: She has a normal mood and affect.    ED Course  Procedures (including critical care time) Labs Review Labs Reviewed - No data to display Imaging Review No results found.   EKG Interpretation None      MDM   Final diagnoses:  None    1. Diarrhea  IV start unsuccessful, however, patient appears hydrated and non-toxic. Will encourage oral hydration at home, give Lomotil and recommend PCP recheck in 2 days. Return precautions given.     Dewaine Oats, PA-C 06/12/13 1521

## 2013-06-14 ENCOUNTER — Encounter: Payer: Self-pay | Admitting: Family Medicine

## 2013-06-14 ENCOUNTER — Ambulatory Visit (INDEPENDENT_AMBULATORY_CARE_PROVIDER_SITE_OTHER): Payer: 59 | Admitting: Family Medicine

## 2013-06-14 VITALS — BP 122/72 | HR 79 | Temp 98.2°F | Ht 67.0 in | Wt 319.1 lb

## 2013-06-14 DIAGNOSIS — R197 Diarrhea, unspecified: Secondary | ICD-10-CM

## 2013-06-14 DIAGNOSIS — I10 Essential (primary) hypertension: Secondary | ICD-10-CM

## 2013-06-14 MED ORDER — METRONIDAZOLE 500 MG PO TABS: 500.0000 mg | ORAL_TABLET | Freq: Three times a day (TID) | ORAL | Status: DC

## 2013-06-14 NOTE — Progress Notes (Signed)
Pre visit review using our clinic review tool, if applicable. No additional management support is needed unless otherwise documented below in the visit note. 

## 2013-06-14 NOTE — Patient Instructions (Signed)
Clostridium Difficile Infection Clostridium difficile (C. difficile) is a bacteria found in the intestinal tract or colon. Under certain conditions, it causes diarrhea and sometimes severe disease. The severe form of the disease is known as pseudomembranous colitis (often called C. difficile colitis). This disease can damage the lining of the colon or cause the colon to become enlarged (toxic megacolon).  CAUSES  Your colon normally contains many different bacteria, including C. difficile. The balance of bacteria in your colon can change during illness. This is especially true when you take antibiotic medicine. Taking antibiotics may allow the C. difficile to grow, multiply excessively, and make a toxin that then causes illness. The elderly and people with certain medical conditions have a greater risk of getting C. difficile infections. SYMPTOMS   Watery diarrhea.  Fever.  Fatigue.  Loss of appetite.  Nausea.  Abdominal swelling, pain, or tenderness.  Dehydration. DIAGNOSIS  Your symptoms may make your caregiver suspicious of a C. difficile infection, especially if you have used antibiotics in the preceding weeks. However, there are only 2 ways to know for certain whether you have a C. difficile infection:  A lab test that finds the toxin in your stool.  The specific appearance of an abnormality (pseudomembrane) in your colon. This can only be seen by doing a sigmoidoscopy or colonoscopy. These procedures involve passing an instrument through your rectum to look at the inside of your colon. Your caregiver will help determine if these tests are necessary. TREATMENT   Most people are successfully treated with one of two specific antibiotics, usually given by mouth. Other antibiotics you are receiving are stopped if possible.  Intravenous (IV) fluids and correction of electrolyte imbalance may be necessary.  Rarely, surgery may be needed to remove the infected part of the  intestines.  Careful hand washing by you and your caregivers is important to prevent the spread of infection. In the hospital, your caregivers may also put on gowns and gloves to prevent the spread of the C. difficile bacteria. Your room is also cleaned regularly with a solution containing bleach or a product that is known to kill C. difficile. HOME CARE INSTRUCTIONS  Drink enough fluids to keep your urine clear or pale yellow. Avoid milk, caffeine, and alcohol.  Ask your caregiver for specific rehydration instructions.  Try eating small, frequent meals rather than large meals.  Take your antibiotics as directed. Finish them even if you start to feel better.  Do not use medicines to slow diarrhea. This could delay healing or cause complications.  Wash your hands thoroughly after using the bathroom and before preparing food.  Make sure people who live with you wash their hands often, too.  Carefully disinfect all surfaces with a product that contains chlorine bleach. SEEK MEDICAL CARE IF:  Diarrhea persists longer than expected or recurs after completing your course of antibiotic treatment for the C. difficile infection.  You have trouble staying hydrated. SEEK IMMEDIATE MEDICAL CARE IF:  You develop a new fever.  You have increasing abdominal pain or tenderness.  There is blood in your stools, or your stools are dark black and tarry.  You cannot hold down food or liquids. MAKE SURE YOU:   Understand these instructions.  Will watch your condition.  Will get help right away if you are not doing well or get worse. Document Released: 11/27/2004 Document Revised: 06/14/2012 Document Reviewed: 07/26/2010 [employer] Patient Information 2014 [employer].  

## 2013-06-16 ENCOUNTER — Telehealth: Payer: Self-pay

## 2013-06-16 LAB — OVA AND PARASITE EXAMINATION

## 2013-06-16 LAB — CLOSTRIDIUM DIFFICILE BY PCR: Toxigenic C. Difficile by PCR: NOT DETECTED

## 2013-06-16 LAB — FECAL LACTOFERRIN, QUANT: Lactoferrin: POSITIVE

## 2013-06-16 MED ORDER — NITAZOXANIDE 500 MG PO TABS: 500.0000 mg | ORAL_TABLET | Freq: Two times a day (BID) | ORAL | Status: DC

## 2013-06-16 MED ORDER — PROMETHAZINE HCL 25 MG PO TABS: 25.0000 mg | ORAL_TABLET | Freq: Three times a day (TID) | ORAL | Status: DC | PRN

## 2013-06-16 NOTE — Telephone Encounter (Signed)
Patient called stating that she is having severe stomach cramps and would like something for nausea sent to pharmacy?  Please advise?

## 2013-06-16 NOTE — Telephone Encounter (Signed)
Patient informed. 

## 2013-06-16 NOTE — Telephone Encounter (Signed)
Phenergan 25 mg po q 6 hours prn nausea/vomit, disp #20 with 1 rf but warn her it can cause sedation

## 2013-06-17 ENCOUNTER — Telehealth: Payer: Self-pay

## 2013-06-17 NOTE — Telephone Encounter (Signed)
OK to change her note to reflect out til 06/27/13.

## 2013-06-17 NOTE — Telephone Encounter (Signed)
Pt left a message stating that she doesn't anticipate going to work on the 20th like her note originally stated due to not being able to start medication yesterday since pharmacy had to order? Pt states she needs a note stating she has been in MD's care from 06-10-13 through 06-27-13 (pt states she doesn't anticipate going back until the 27th)?  Please advise?

## 2013-06-19 ENCOUNTER — Encounter: Payer: Self-pay | Admitting: Family Medicine

## 2013-06-19 DIAGNOSIS — R197 Diarrhea, unspecified: Secondary | ICD-10-CM | POA: Insufficient documentation

## 2013-06-19 LAB — STOOL CULTURE

## 2013-06-19 NOTE — Progress Notes (Signed)
Patient ID: Jennifer Rich, female   DOB: 1965-05-11, 48 y.o.   MRN: [id number] AARIYA FERRICK [id number] 21-Oct-1965 06/19/2013      Progress Note-Follow Up  Subjective  Chief Complaint  Chief Complaint  Patient presents with  . Diarrhea    dehydrated X 8 days, goes to the bathroom every 15 minutes and has light yellow "poop" that is mucus and cramps in stomach    HPI  Patient is a 48 year old female in today for routine medical care. She is complaining of diarrhea for the last week. As a child. Spouse had some abdominal cramping but no obvious fevers or chills. No bloody stool or tarry stool. Has been trying to stay hydrated but has not been eating well. Denies CP/palp/SOB/HA/congestion/fevers or GU c/o. Taking meds as prescribed  Past Medical History  Diagnosis Date  . Hypertension   . Pneumonia   . Chicken pox as a child  . Hyperlipidemia   . Vitamin D deficiency   . Thyroid disease   . Allergy   . Hyperglycemia   . Preventative health care 12/18/2012    Has seen Dr Carlota Raspberry for GYN in past  . Type II or unspecified type diabetes mellitus without mention of complication, not stated as uncontrolled   . Cervical cancer screening 01/25/2013  . Anxiety and depression 01/30/2013  . Hyperglycemia   . Goiter     Mildly enlarged thyroid TSH normal   . Obesity, unspecified 04/26/2013  . Pneumonia 05/02/2013    Past Surgical History  Procedure Laterality Date  . None    . Wisdom tooth extraction  48 yrs old    Family History  Problem Relation Age of Onset  . Heart attack Maternal Grandfather 75  . Heart disease Maternal Grandfather   . Diabetes Maternal Grandmother     type 2  . Blindness Maternal Grandmother   . Hypertension Paternal Grandmother   . Stroke Paternal Grandfather   . Hypertension Sister   . Hypertension Sister     History   Social History  . Marital Status: Married    Spouse Name: N/A    Number of Children: 0  . Years of Education: N/A    Occupational History  . [employer]    Social History Main Topics  . Smoking status: Never Smoker   . Smokeless tobacco: Never Used  . Alcohol Use: No     Comment: No Pork  . Drug Use: No  . Sexual Activity: Yes    Partners: Male    Birth Control/ Protection: None     Comment: lives with husband and [place], [place]   Other Topics Concern  . Not on file   Social History Narrative  . No narrative on file    Current Outpatient Prescriptions on File Prior to Visit  Medication Sig Dispense Refill  . acetaminophen (TYLENOL) 500 MG tablet Take 1 tablet (500 mg total) by mouth every 6 (six) hours as needed for mild pain, moderate pain or fever.  30 tablet  0  . acetaminophen-codeine (CAPITAL/CODEINE) 120-12 MG/5ML suspension Take 5 mLs by mouth every 8 (eight) hours as needed for pain (cough). Causes sedation use at bedtime  240 mL  0  . albuterol (PROVENTIL HFA;VENTOLIN HFA) 108 (90 BASE) MCG/ACT inhaler Inhale 2 puffs into the lungs every 6 (six) hours as needed for wheezing or shortness of breath (prior to exertion).  1 Inhaler  01  . amLODipine (NORVASC) 5 MG tablet Take 1 tablet (5  mg total) by mouth daily.  30 tablet  3  . diphenoxylate-atropine (LOMOTIL) 2.5-0.025 MG per tablet Take 2 tablets by mouth 4 (four) times daily as needed for diarrhea or loose stools (MAX 8 TABLETS PER DAY).  12 tablet  0  . ibuprofen (ADVIL,MOTRIN) 800 MG tablet Take 1 tablet (800 mg total) by mouth every 6 (six) hours as needed for fever, mild pain or moderate pain.  21 tablet  0  . levofloxacin (LEVAQUIN) 750 MG tablet Take 1 tablet (750 mg total) by mouth daily.  7 tablet  0  . losartan (COZAAR) 100 MG tablet Take 1 tablet (100 mg total) by mouth daily.  30 tablet  3  . Nebivolol HCl (BYSTOLIC) 20 MG TABS Take 1 tablet (20 mg total) by mouth daily.  30 tablet  3  . ondansetron (ZOFRAN) 4 MG tablet Take 1 tablet (4 mg total) by mouth every 6 (six) hours.  12 tablet  0  .  triamterene-hydrochlorothiazide (MAXZIDE-25) 37.5-25 MG per tablet Take 1 tablet by mouth every morning.  30 tablet  0   No current facility-administered medications on file prior to visit.    No Known Allergies  Review of Systems  Review of Systems  Constitutional: Negative for fever and malaise/fatigue.  HENT: Negative for congestion.   Eyes: Negative for discharge.  Respiratory: Negative for shortness of breath.   Cardiovascular: Negative for chest pain, palpitations and leg swelling.  Gastrointestinal: Positive for abdominal pain and diarrhea. Negative for nausea.  Genitourinary: Negative for dysuria.  Musculoskeletal: Negative for falls.  Skin: Negative for rash.  Neurological: Negative for loss of consciousness and headaches.  Endo/Heme/Allergies: Negative for polydipsia.  Psychiatric/Behavioral: Negative for depression and suicidal ideas. The patient is not nervous/anxious and does not have insomnia.     Objective  BP 122/72  Pulse 79  Temp(Src) 98.2 F (36.8 C) (Oral)  Ht 5\' 7"  (1.702 m)  Wt 319 lb 1.9 oz (144.752 kg)  BMI 49.97 kg/m2  SpO2 98%  LMP 05/20/2013  Physical Exam  Physical Exam  Constitutional: She is oriented to person, place, and time and well-developed, well-nourished, and in no distress. No distress.  HENT:  Head: Normocephalic and atraumatic.  Eyes: Conjunctivae are normal.  Neck: Neck supple. No thyromegaly present.  Cardiovascular: Normal rate, regular rhythm and normal heart sounds.   No murmur heard. Pulmonary/Chest: Effort normal and breath sounds normal. She has no wheezes.  Abdominal: She exhibits no distension and no mass.  Musculoskeletal: She exhibits no edema.  Lymphadenopathy:    She has no cervical adenopathy.  Neurological: She is alert and oriented to person, place, and time.  Skin: Skin is warm and dry. No rash noted. She is not diaphoretic.  Psychiatric: Memory, affect and judgment normal.    Lab Results  Component  Value Date   TSH 1.321 03/28/2013   Lab Results  Component Value Date   WBC 5.3 05/04/2013   HGB 13.9 05/04/2013   HCT 41.2 05/04/2013   MCV 76.4* 05/04/2013   PLT 186 05/04/2013   Lab Results  Component Value Date   CREATININE 0.82 05/04/2013   BUN 13 05/04/2013   NA 137 05/04/2013   K 4.0 05/04/2013   CL 101 05/04/2013   CO2 21 05/04/2013   Lab Results  Component Value Date   ALT 10 03/21/2013   AST 21 03/21/2013   ALKPHOS 95 03/21/2013   BILITOT 0.8 03/21/2013   Lab Results  Component Value Date   CHOL  210* 12/16/2012   Lab Results  Component Value Date   HDL 51 12/16/2012   Lab Results  Component Value Date   LDLCALC 133* 12/16/2012   Lab Results  Component Value Date   TRIG 132 12/16/2012   Lab Results  Component Value Date   CHOLHDL 4.1 12/16/2012     Assessment & Plan  HTN (hypertension) Well controlled, no changes to meds. Encouraged heart healthy diet such as the DASH diet and exercise as tolerated.   Diarrhea Check stool cultures.  Clear fluids and simple carbs. Start a probiotic and report worsening symptoms

## 2013-06-19 NOTE — Assessment & Plan Note (Signed)
Well controlled, no changes to meds. Encouraged heart healthy diet such as the DASH diet and exercise as tolerated.  °

## 2013-06-19 NOTE — Assessment & Plan Note (Signed)
Check stool cultures.  Clear fluids and simple carbs. Start a probiotic and report worsening symptoms

## 2013-06-21 ENCOUNTER — Telehealth: Payer: Self-pay

## 2013-06-21 NOTE — Telephone Encounter (Signed)
Pt called and left a message stating that she would like to know if she is to take the flagyl and diflucan?  I called pt and she states she has 1 day left of the nitazoxanide. Pt states she is able to eat now but is still weak?  Please advise? Pt stated we can send the answer through mychart

## 2013-06-21 NOTE — Telephone Encounter (Signed)
I would have her take the Diflucan but not the flagyl

## 2013-06-27 ENCOUNTER — Ambulatory Visit: Payer: 59 | Admitting: Family Medicine

## 2013-06-28 ENCOUNTER — Ambulatory Visit (INDEPENDENT_AMBULATORY_CARE_PROVIDER_SITE_OTHER): Payer: 59 | Admitting: Family Medicine

## 2013-06-28 ENCOUNTER — Telehealth: Payer: Self-pay

## 2013-06-28 ENCOUNTER — Encounter: Payer: Self-pay | Admitting: Family Medicine

## 2013-06-28 VITALS — BP 122/70 | HR 81 | Temp 97.8°F | Ht 67.0 in | Wt 312.0 lb

## 2013-06-28 DIAGNOSIS — B889 Infestation, unspecified: Secondary | ICD-10-CM

## 2013-06-28 DIAGNOSIS — I10 Essential (primary) hypertension: Secondary | ICD-10-CM

## 2013-06-28 DIAGNOSIS — B379 Candidiasis, unspecified: Secondary | ICD-10-CM

## 2013-06-28 DIAGNOSIS — E669 Obesity, unspecified: Secondary | ICD-10-CM

## 2013-06-28 DIAGNOSIS — B89 Unspecified parasitic disease: Secondary | ICD-10-CM

## 2013-06-28 MED ORDER — FLUCONAZOLE 150 MG PO TABS: ORAL_TABLET | ORAL | Status: DC

## 2013-06-28 MED ORDER — NEBIVOLOL HCL 10 MG PO TABS: 20.0000 mg | ORAL_TABLET | Freq: Every day | ORAL | Status: DC

## 2013-06-28 MED ORDER — NITAZOXANIDE 500 MG PO TABS: 500.0000 mg | ORAL_TABLET | Freq: Two times a day (BID) | ORAL | Status: DC

## 2013-06-28 NOTE — Telephone Encounter (Signed)
Left a message for [facility] in the pharmacy to get the PA started for this.

## 2013-06-28 NOTE — Telephone Encounter (Signed)
Can we try a prior Auth?

## 2013-06-28 NOTE — Patient Instructions (Signed)
Blastocystis Hominis Infection Blastocystis hominis is a common parasitic organism that can only be seen with a microscope (microscopic). It is found throughout the world. An infection with Blastocystis hominis is called blastocystosis. It lives in your colon (large intestine). It can remain there for weeks, months or years. Experts are not certain whether or not blastocystis causes disease, or even if it needs to be treated. CAUSES No one knows how people get blastocystis in their body. The number of people infected seems to increase in areas where sanitation and personal cleanliness is poor. Travelers returning from developing countries seem to be at higher risk for having blastocystis. Careful hygiene is important to try to prevent this infection:  Wash hands with soap and water after using the toilet and before handling food.  If you work in a child care center where you change diapers, be sure to wash your hands thoroughly with plenty of soap and warm water after every diaper change, even if you wear gloves.  Avoid water or food that may be contaminated.  Wash and peel all raw vegetables and fruits before eating.  When traveling in countries where the water supply may be unsafe:  Avoid drinking unboiled tap water, drinking any drink with ice cubes and eating uncooked foods washed with unboiled tap water.  Bottled or canned carbonated beverages, seltzers and pasteurized fruit drinks are safe to drink. SYMPTOMS  Some experts believe bastocystis can cause these symptoms; others disagree:  Frequent watery or loose stools (diarrhea).  Abdominal pain.  Anal itching.  Weight loss.  Excess gas.  Many people have no symptoms at all. DIAGNOSIS  If you or your child has the symptoms above, you will likely be asked to submit specimens of your or your child's poop (stool) for testing. If blastocystis is found in the stool, the technicians will also do a careful search for other possible  causes of the symptoms.  Symptoms may be caused by infection with other parasitic organisms, bacteria, or viruses. Often, B. hominis is found along with other organisms that are more likely to be the cause of your symptoms.  Sometimes symptoms are not caused by an infection at all.  Antibiotics, some cancer drugs, and medications used to control high blood pressure may be causing the symptoms.  Hormone or endocrine diseases; diseases like Crohn's, colitis, or hereditary factors may be the cause of illness.  Food additives or food allergies may also cause abdominal discomfort. TREATMENT  Since experts cannot be sure if blastocystis is causing the symptoms, it is not clear that treatment would be helpful (it might even be harmful). Some people get better with no treatment. If no other cause is found for the symptoms, then your caregiver may suggest treatment aimed at blastocystis to see if you or your child feels better. Several medications are available by prescription to treat blastocystosis. You or your child may need follow-up testing after treatment. HOME CARE INSTRUCTIONS   Take any medications exactly as directed, for as long as directed.  Practice good personal cleanliness, including thorough hand washing with soap and warm water.  If you or your child is having diarrhea, be sure to drink enough fluids to avoid dehydration. SEEK MEDICAL CARE IF:   You or your child is not getting better even after following all your caregiver's directions, or if you get worse.  You or your child develops side effects to your medications.  You or your child is losing weight without dieting.  You or your child  develops other abdominal problems: you or your child may need a reevaluation to see if something besides blastocystis is causing the symptoms.  You or your child has an oral temperature above 102 F (38.9 C).  Your baby is older than 3 months with a rectal temperature of 100.5 F (38.1 C)  or higher for more than 1 day. SEEK IMMEDIATE MEDICAL CARE IF:   You or your child has severe abdominal pain.  You or your child has diarrhea that is not stopping.  You or your child has an oral temperature above 102 F (38.9 C), not controlled by medicine.  Your baby is older than 3 months with a rectal temperature of 102 F (38.9 C) or higher.  Your baby is 71 months old or younger with a rectal temperature of 100.4 F (38 C) or higher. Document Released: 05/10/2003 Document Revised: 05/12/2011 Document Reviewed: 10/01/2007 [facility] Patient Information 2014 [place].

## 2013-06-28 NOTE — Progress Notes (Signed)
Pre visit review using our clinic review tool, if applicable. No additional management support is needed unless otherwise documented below in the visit note. 

## 2013-06-28 NOTE — Telephone Encounter (Signed)
Martyn Malay with the pharmacy left a message stating that pts insurance will only pay for 2 tabs of diflucan. She filled the 2 tabs and gave to patient. Martyn Malay would like to know if MD wants to do a pa for the 20 (18) or just leave it at 2 tabs?

## 2013-06-28 NOTE — Telephone Encounter (Signed)
Another option is it used to be $4 a pill at [facility] she could try that

## 2013-06-29 NOTE — Telephone Encounter (Signed)
Received PA paperwork, forward to nurse

## 2013-06-30 ENCOUNTER — Telehealth: Payer: Self-pay

## 2013-06-30 NOTE — Telephone Encounter (Signed)
Can you send back another form. This was not in Dr Rhae Lerner box this morning

## 2013-06-30 NOTE — Telephone Encounter (Signed)
PA paperwork forward again

## 2013-06-30 NOTE — Telephone Encounter (Signed)
Pt left a message stating she picked up 2 more diflucan and is wandering about the pa for the other 16 tabs?

## 2013-07-03 ENCOUNTER — Encounter: Payer: Self-pay | Admitting: Family Medicine

## 2013-07-03 DIAGNOSIS — B379 Candidiasis, unspecified: Secondary | ICD-10-CM | POA: Insufficient documentation

## 2013-07-03 DIAGNOSIS — A078 Other specified protozoal intestinal diseases: Secondary | ICD-10-CM

## 2013-07-03 HISTORY — DX: Other specified protozoal intestinal diseases: A07.8

## 2013-07-03 NOTE — Assessment & Plan Note (Signed)
Partial response to treatment, may need to retreat.

## 2013-07-03 NOTE — Assessment & Plan Note (Signed)
Well controlled, no changes to meds. Encouraged heart healthy diet such as the DASH diet and exercise as tolerated.  °

## 2013-07-03 NOTE — Assessment & Plan Note (Signed)
Given course of diflucan and encouraged probiotics

## 2013-07-03 NOTE — Progress Notes (Signed)
Patient ID: Jennifer Rich, female   DOB: 1965-06-05, 48 y.o.   MRN: [id number] Jennifer Rich [id number] 05/02/1965 07/03/2013      Progress Note-Follow Up  Subjective  Chief Complaint  Chief Complaint  Patient presents with  . Follow-up    HPI  Patient is a 48 year old female in today for routine medical care. Had trouble with diarrhea. It is somewhat better with treatment but is worsening again. No fevers or chills. No nausea vomiting. No other new complaints today. Encouraged increased hydration, 64 ounces of clear fluids daily. Minimize alcohol and caffeine. Eat small frequent meals with lean proteins and complex carbs. Avoid high and low blood sugars. Get adequate sleep, 7-8 hours a night. Needs exercise daily preferably in the morning.  Past Medical History  Diagnosis Date  . Hypertension   . Pneumonia   . Chicken pox as a child  . Hyperlipidemia   . Vitamin D deficiency   . Thyroid disease   . Allergy   . Hyperglycemia   . Preventative health care 12/18/2012    Has seen Dr Carlota Raspberry for GYN in past  . Type II or unspecified type diabetes mellitus without mention of complication, not stated as uncontrolled   . Cervical cancer screening 01/25/2013  . Anxiety and depression 01/30/2013  . Hyperglycemia   . Goiter     Mildly enlarged thyroid TSH normal   . Obesity, unspecified 04/26/2013  . Pneumonia 05/02/2013    Past Surgical History  Procedure Laterality Date  . None    . Wisdom tooth extraction  48 yrs old    Family History  Problem Relation Age of Onset  . Heart attack Maternal Grandfather 75  . Heart disease Maternal Grandfather   . Diabetes Maternal Grandmother     type 2  . Blindness Maternal Grandmother   . Hypertension Paternal Grandmother   . Stroke Paternal Grandfather   . Hypertension Sister   . Hypertension Sister     History   Social History  . Marital Status: Married    Spouse Name: N/A    Number of Children: 0  . Years of Education:  N/A   Occupational History  . SALES [employer]    Social History Main Topics  . Smoking status: Never Smoker   . Smokeless tobacco: Never Used  . Alcohol Use: No     Comment: No Pork  . Drug Use: No  . Sexual Activity: Yes    Partners: Male    Birth Control/ Protection: None     Comment: lives with husband and [place], [place]   Other Topics Concern  . Not on file   Social History Narrative  . No narrative on file    Current Outpatient Prescriptions on File Prior to Visit  Medication Sig Dispense Refill  . acetaminophen (TYLENOL) 500 MG tablet Take 1 tablet (500 mg total) by mouth every 6 (six) hours as needed for mild pain, moderate pain or fever.  30 tablet  0  . ibuprofen (ADVIL,MOTRIN) 800 MG tablet Take 1 tablet (800 mg total) by mouth every 6 (six) hours as needed for fever, mild pain or moderate pain.  21 tablet  0  . losartan (COZAAR) 100 MG tablet Take 1 tablet (100 mg total) by mouth daily.  30 tablet  3  . triamterene-hydrochlorothiazide (MAXZIDE-25) 37.5-25 MG per tablet Take 1 tablet by mouth every morning.  30 tablet  0  . promethazine (PHENERGAN) 25 MG tablet Take 1 tablet (25 mg  total) by mouth every 8 (eight) hours as needed for nausea or vomiting.  20 tablet  1   No current facility-administered medications on file prior to visit.    No Known Allergies  Review of Systems  Review of Systems  Constitutional: Positive for malaise/fatigue. Negative for fever.  HENT: Negative for congestion.   Eyes: Negative for discharge.  Respiratory: Negative for shortness of breath.   Cardiovascular: Negative for chest pain, palpitations and leg swelling.  Gastrointestinal: Positive for diarrhea. Negative for nausea and abdominal pain.  Genitourinary: Negative for dysuria.  Musculoskeletal: Negative for falls.  Skin: Negative for rash.  Neurological: Negative for loss of consciousness and headaches.  Endo/Heme/Allergies: Negative for polydipsia.   Psychiatric/Behavioral: Negative for depression and suicidal ideas. The patient is not nervous/anxious and does not have insomnia.     Objective  BP 122/70  Pulse 81  Temp(Src) 97.8 F (36.6 C) (Oral)  Ht 5\' 7"  (1.702 m)  Wt 312 lb (141.522 kg)  BMI 48.85 kg/m2  SpO2 93%  LMP 06/24/2013  Physical Exam  Physical Exam  Constitutional: She is oriented to person, place, and time and well-developed, well-nourished, and in no distress. No distress.  HENT:  Head: Normocephalic and atraumatic.  Eyes: Conjunctivae are normal.  Neck: Neck supple. No thyromegaly present.  Cardiovascular: Normal rate, regular rhythm and normal heart sounds.   No murmur heard. Pulmonary/Chest: Effort normal and breath sounds normal. She has no wheezes.  Abdominal: She exhibits no distension and no mass.  Musculoskeletal: She exhibits no edema.  Lymphadenopathy:    She has no cervical adenopathy.  Neurological: She is alert and oriented to person, place, and time.  Skin: Skin is warm and dry. No rash noted. She is not diaphoretic.  Psychiatric: Memory, affect and judgment normal.    Lab Results  Component Value Date   TSH 1.321 03/28/2013   Lab Results  Component Value Date   WBC 5.3 05/04/2013   HGB 13.9 05/04/2013   HCT 41.2 05/04/2013   MCV 76.4* 05/04/2013   PLT 186 05/04/2013   Lab Results  Component Value Date   CREATININE 0.82 05/04/2013   BUN 13 05/04/2013   NA 137 05/04/2013   K 4.0 05/04/2013   CL 101 05/04/2013   CO2 21 05/04/2013   Lab Results  Component Value Date   ALT 10 03/21/2013   AST 21 03/21/2013   ALKPHOS 95 03/21/2013   BILITOT 0.8 03/21/2013   Lab Results  Component Value Date   CHOL 210* 12/16/2012   Lab Results  Component Value Date   HDL 51 12/16/2012   Lab Results  Component Value Date   LDLCALC 133* 12/16/2012   Lab Results  Component Value Date   TRIG 132 12/16/2012   Lab Results  Component Value Date   CHOLHDL 4.1 12/16/2012     Assessment & Plan  Obesity,  unspecified Encouraged DASH diet, decrease po intake and increase exercise as tolerated. Needs 7-8 hours of sleep nightly. Avoid trans fats, eat small, frequent meals every 4-5 hours with lean proteins, complex carbs and healthy fats. Minimize simple carbs, GMO foods.  HTN (hypertension) Well controlled, no changes to meds. Encouraged heart healthy diet such as the DASH diet and exercise as tolerated.   Parasite infection Partial response to treatment, may need to retreat.  Candidiasis Given course of diflucan and encouraged probiotics

## 2013-07-03 NOTE — Assessment & Plan Note (Signed)
Encouraged DASH diet, decrease po intake and increase exercise as tolerated. Needs 7-8 hours of sleep nightly. Avoid trans fats, eat small, frequent meals every 4-5 hours with lean proteins, complex carbs and healthy fats. Minimize simple carbs, GMO foods. 

## 2013-07-04 ENCOUNTER — Telehealth: Payer: Self-pay | Admitting: Family Medicine

## 2013-07-04 ENCOUNTER — Other Ambulatory Visit: Payer: Self-pay | Admitting: Family Medicine

## 2013-07-04 DIAGNOSIS — A078 Other specified protozoal intestinal diseases: Secondary | ICD-10-CM

## 2013-07-04 NOTE — Telephone Encounter (Signed)
No I do not usually treat this so if he feels it is not working it could be something else or is her but just resistent so we could retest or I could send her to infectious disease for further consideration

## 2013-07-04 NOTE — Telephone Encounter (Signed)
?   Do not have it did not notice it this weekend

## 2013-07-04 NOTE — Telephone Encounter (Signed)
Per pt she stated in mychart message she could go see a specialist.  Please advise?

## 2013-07-04 NOTE — Telephone Encounter (Signed)
Referral placed.

## 2013-07-04 NOTE — Telephone Encounter (Signed)
This was faxed on 06-30-13. Still waiting to hear from insurance

## 2013-07-04 NOTE — Telephone Encounter (Signed)
p 

## 2013-07-04 NOTE — Telephone Encounter (Signed)
Patient states that the treatment for the parasite that she has is not working and would like to know if Dr. Charlett Blake could prescribe her something else?

## 2013-07-05 ENCOUNTER — Ambulatory Visit: Payer: 59 | Admitting: Family Medicine

## 2013-07-05 ENCOUNTER — Other Ambulatory Visit: Payer: Self-pay | Admitting: Family Medicine

## 2013-07-05 DIAGNOSIS — A078 Other specified protozoal intestinal diseases: Secondary | ICD-10-CM

## 2013-07-05 DIAGNOSIS — R197 Diarrhea, unspecified: Secondary | ICD-10-CM

## 2013-07-05 NOTE — Telephone Encounter (Signed)
No I never did notice that note. So I am willing to extend her note til the end of may since she is not improving as long as she does not believe it will hurt her job because I cannot protect that.

## 2013-07-05 NOTE — Telephone Encounter (Signed)
Dr Charlett Blake have you answered the mychart message about being out of work until the end of May?

## 2013-07-05 NOTE — Telephone Encounter (Signed)
Denied per insurance: The quantity requested exceeds the plan's limit of 2 tab per copay for the dianosis provided of candidiasis of gastrointestinal tract.  Paperwork on [employer]

## 2013-07-05 NOTE — Telephone Encounter (Signed)
She can either pay cash or just wait on input from infectious disease

## 2013-07-06 ENCOUNTER — Encounter: Payer: Self-pay | Admitting: Gastroenterology

## 2013-07-08 NOTE — Telephone Encounter (Signed)
See other note

## 2013-07-12 ENCOUNTER — Encounter: Payer: Self-pay | Admitting: Family Medicine

## 2013-07-12 ENCOUNTER — Ambulatory Visit (INDEPENDENT_AMBULATORY_CARE_PROVIDER_SITE_OTHER): Payer: 59 | Admitting: Family Medicine

## 2013-07-12 VITALS — BP 128/86 | HR 80 | Temp 98.1°F | Ht 67.0 in | Wt 316.1 lb

## 2013-07-12 DIAGNOSIS — I10 Essential (primary) hypertension: Secondary | ICD-10-CM

## 2013-07-12 DIAGNOSIS — A073 Isosporiasis: Secondary | ICD-10-CM

## 2013-07-12 DIAGNOSIS — A078 Other specified protozoal intestinal diseases: Secondary | ICD-10-CM

## 2013-07-12 DIAGNOSIS — B379 Candidiasis, unspecified: Secondary | ICD-10-CM

## 2013-07-12 DIAGNOSIS — J189 Pneumonia, unspecified organism: Secondary | ICD-10-CM

## 2013-07-12 NOTE — Patient Instructions (Signed)
Diet for Diarrhea, Adult  Frequent, runny stools (diarrhea) may be caused or worsened by food or drink. Diarrhea may be relieved by changing your diet. Since diarrhea can last up to 7 days, it is easy for you to lose too much fluid from the body and become dehydrated. Fluids that are lost need to be replaced. Along with a modified diet, make sure you drink enough fluids to keep your urine clear or pale yellow.  DIET INSTRUCTIONS  · Ensure adequate fluid intake (hydration): have 1 cup (8 oz) of fluid for each diarrhea episode. Avoid fluids that contain simple sugars or sports drinks, fruit juices, whole milk products, and sodas. Your urine should be clear or pale yellow if you are drinking enough fluids. Hydrate with an oral rehydration solution that you can purchase at pharmacies, retail stores, and online. You can prepare an oral rehydration solution at home by mixing the following ingredients together:  ·   tsp table salt.  · ¾ tsp baking soda.  ·  tsp salt substitute containing potassium chloride.  · 1  tablespoons sugar.  · 1 L (34 oz) of water.  · Certain foods and beverages may increase the speed at which food moves through the gastrointestinal (GI) tract. These foods and beverages should be avoided and include:  · Caffeinated and alcoholic beverages.  · High-fiber foods, such as raw fruits and vegetables, nuts, seeds, and whole grain breads and cereals.  · Foods and beverages sweetened with sugar alcohols, such as xylitol, sorbitol, and mannitol.  · Some foods may be well tolerated and may help thicken stool including:  · Starchy foods, such as rice, toast, pasta, low-sugar cereal, oatmeal, grits, baked potatoes, crackers, and bagels.    · Bananas.    · Applesauce.  · Add probiotic-rich foods to help increase healthy bacteria in the GI tract, such as yogurt and fermented milk products.  RECOMMENDED FOODS AND BEVERAGES  Starches  Choose foods with less than 2 g of fiber per serving.  · Recommended:  White,  French, and pita breads, plain rolls, buns, bagels. Plain muffins, matzo. Soda, saltine, or graham crackers. Pretzels, melba toast, zwieback. Cooked cereals made with water: cornmeal, farina, cream cereals. Dry cereals: refined corn, wheat, rice. Potatoes prepared any way without skins, refined macaroni, spaghetti, noodles, refined rice.  · Avoid:  Bread, rolls, or crackers made with whole wheat, multi-grains, rye, bran seeds, nuts, or coconut. Corn tortillas or taco shells. Cereals containing whole grains, multi-grains, bran, coconut, nuts, raisins. Cooked or dry oatmeal. Coarse wheat cereals, granola. Cereals advertised as "high-fiber." Potato skins. Whole grain pasta, wild or brown rice. Popcorn. Sweet potatoes, yams. Sweet rolls, doughnuts, waffles, pancakes, sweet breads.  Vegetables  · Recommended: Strained tomato and vegetable juices. Most well-cooked and canned vegetables without seeds. Fresh: Tender lettuce, cucumber without the skin, cabbage, spinach, bean sprouts.  · Avoid: Fresh, cooked, or canned: Artichokes, baked beans, beet greens, broccoli, Brussels sprouts, corn, kale, legumes, peas, sweet potatoes. Cooked: Green or red cabbage, spinach. Avoid large servings of any vegetables because vegetables shrink when cooked, and they contain more fiber per serving than fresh vegetables.  Fruit  · Recommended: Cooked or canned: Apricots, applesauce, cantaloupe, cherries, fruit cocktail, grapefruit, grapes, kiwi, mandarin oranges, peaches, pears, plums, watermelon. Fresh: Apples without skin, ripe banana, grapes, cantaloupe, cherries, grapefruit, peaches, oranges, plums. Keep servings limited to ½ cup or 1 piece.  · Avoid: Fresh: Apples with skin, apricots, mangoes, pears, raspberries, strawberries. Prune juice, stewed or dried prunes. Dried   fruits, raisins, dates. Large servings of all fresh fruits.  Protein  · Recommended: Ground or well-cooked tender beef, ham, veal, lamb, pork, or poultry. Eggs. Fish,  oysters, shrimp, lobster, other seafoods. Liver, organ meats.  · Avoid: Tough, fibrous meats with gristle. Peanut butter, smooth or chunky. Cheese, nuts, seeds, legumes, dried peas, beans, lentils.  Dairy  · Recommended: Yogurt, lactose-free milk, kefir, drinkable yogurt, buttermilk, soy milk, or plain hard cheese.  · Avoid: Milk, chocolate milk, beverages made with milk, such as milkshakes.  Soups  · Recommended: Bouillon, broth, or soups made from allowed foods. Any strained soup.  · Avoid: Soups made from vegetables that are not allowed, cream or milk-based soups.  Desserts and Sweets  · Recommended: Sugar-free gelatin, sugar-free frozen ice pops made without sugar alcohol.  · Avoid: Plain cakes and cookies, pie made with fruit, pudding, custard, cream pie. Gelatin, fruit, ice, sherbet, frozen ice pops. Ice cream, ice milk without nuts. Plain hard candy, honey, jelly, molasses, syrup, sugar, chocolate syrup, gumdrops, marshmallows.  Fats and Oils  · Recommended: Limit fats to less than 8 tsp per day.  · Avoid: Seeds, nuts, olives, avocados. Margarine, butter, cream, mayonnaise, salad oils, plain salad dressings. Plain gravy, crisp bacon without rind.  Beverages  · Recommended: Water, decaffeinated teas, oral rehydration solutions, sugar-free beverages not sweetened with sugar alcohols.  · Avoid: Fruit juices, caffeinated beverages (coffee, tea, soda), alcohol, sports drinks, or lemon-lime soda.  Condiments  · Recommended: Ketchup, mustard, horseradish, vinegar, cocoa powder. Spices in moderation: allspice, basil, bay leaves, celery powder or leaves, cinnamon, cumin powder, curry powder, ginger, mace, marjoram, onion or garlic powder, oregano, paprika, parsley flakes, ground pepper, rosemary, sage, savory, tarragon, thyme, turmeric.  · Avoid: Coconut, honey.  Document Released: 05/10/2003 Document Revised: 11/12/2011 Document Reviewed: 07/04/2011  [employer]® Patient Information ©2014 [employer].

## 2013-07-12 NOTE — Progress Notes (Signed)
Pre visit review using our clinic review tool, if applicable. No additional management support is needed unless otherwise documented below in the visit note. 

## 2013-07-14 ENCOUNTER — Telehealth: Payer: Self-pay

## 2013-07-14 NOTE — Telephone Encounter (Signed)
Someone (couldn't catch name) from [place] [phone] ext [place] case # [id number].  The lady stated she needed Dr Frederik Pear Tax ID #, address and fax  I tried to call back but the number was trying to sell me different things?   Will close until call back

## 2013-07-17 ENCOUNTER — Encounter: Payer: Self-pay | Admitting: Family Medicine

## 2013-07-17 NOTE — Assessment & Plan Note (Signed)
Well controlled, no changes to meds. Encouraged heart healthy diet such as the DASH diet and exercise as tolerated.  °

## 2013-07-17 NOTE — Assessment & Plan Note (Signed)
Treated with Diflucan

## 2013-07-17 NOTE — Assessment & Plan Note (Signed)
resolved 

## 2013-07-17 NOTE — Progress Notes (Signed)
Patient ID: Jennifer Rich, female   DOB: December 15, 1965, 48 y.o.   MRN: [id number] Jennifer Rich [id number] 20-Apr-1965 07/17/2013      Progress Note-Follow Up  Subjective  Chief Complaint  Chief Complaint  Patient presents with  . Follow-up    2 week    HPI  Patient is a 48 year old female in today for routine medical care. She is in today for followup. She continues to struggle with several loose stool daily but is improving. No bloody or tarry stool. Abdominal pain is improving. No fevers or chills. No respiratory concerns noted. Taking medications as prescribed.  Past Medical History  Diagnosis Date  . Hypertension   . Pneumonia   . Chicken pox as a child  . Hyperlipidemia   . Vitamin D deficiency   . Thyroid disease   . Allergy   . Hyperglycemia   . Preventative health care 12/18/2012    Has seen Dr Carlota Raspberry for GYN in past  . Type II or unspecified type diabetes mellitus without mention of complication, not stated as uncontrolled   . Cervical cancer screening 01/25/2013  . Anxiety and depression 01/30/2013  . Hyperglycemia   . Goiter     Mildly enlarged thyroid TSH normal   . Obesity, unspecified 04/26/2013  . Pneumonia 05/02/2013  . 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 None    . Wisdom tooth extraction  48 yrs old    Family History  Problem Relation Age of Onset  . Heart attack Maternal Grandfather 75  . Heart disease Maternal Grandfather   . Diabetes Maternal Grandmother     type 2  . Blindness Maternal Grandmother   . Hypertension Paternal Grandmother   . Stroke Paternal Grandfather   . Hypertension Sister   . Hypertension Sister     History   Social History  . Marital Status: Married    Spouse Name: N/A    Number of Children: 0  . Years of Education: N/A   Occupational History  . SALES [employer]    Social History Main Topics  . Smoking status: Never  Smoker   . Smokeless tobacco: Never Used  . Alcohol Use: No     Comment: No Pork  . Drug Use: No  . Sexual Activity: Yes    Partners: Male    Birth Control/ Protection: None     Comment: lives with husband and [place], [place]   Other Topics Concern  . Not on file   Social History Narrative  . No narrative on file    Current Outpatient Prescriptions on File Prior to Visit  Medication Sig Dispense Refill  . acetaminophen (TYLENOL) 500 MG tablet Take 1 tablet (500 mg total) by mouth every 6 (six) hours as needed for mild pain, moderate pain or fever.  30 tablet  0  . ibuprofen (ADVIL,MOTRIN) 800 MG tablet Take 1 tablet (800 mg total) by mouth every 6 (six) hours as needed for fever, mild pain or moderate pain.  21 tablet  0  . losartan (COZAAR) 100 MG tablet Take 1 tablet (100 mg total) by mouth daily.  30 tablet  3  . nebivolol (BYSTOLIC) 10 MG tablet Take 2 tablets (20 mg total) by mouth daily.  60 tablet  5  . nitazoxanide (ALINIA) 500 MG tablet Take 1 tablet (500 mg total) by mouth 2 (two) times daily with a meal. X  3 days  6 tablet  0  . promethazine (PHENERGAN) 25 MG tablet Take 1 tablet (25 mg total) by mouth every 8 (eight) hours as needed for nausea or vomiting.  20 tablet  1  . triamterene-hydrochlorothiazide (MAXZIDE-25) 37.5-25 MG per tablet Take 1 tablet by mouth every morning.  30 tablet  0   No current facility-administered medications on file prior to visit.    No Known Allergies  Review of Systems  Review of Systems  Constitutional: Negative for fever and malaise/fatigue.  HENT: Negative for congestion.   Eyes: Negative for discharge.  Respiratory: Negative for shortness of breath.   Cardiovascular: Negative for chest pain, palpitations and leg swelling.  Gastrointestinal: Positive for diarrhea. Negative for nausea and abdominal pain.  Genitourinary: Negative for dysuria.  Musculoskeletal: Negative for falls.  Skin: Negative for rash.  Neurological: Negative  for loss of consciousness and headaches.  Endo/Heme/Allergies: Negative for polydipsia.  Psychiatric/Behavioral: Negative for depression and suicidal ideas. The patient is not nervous/anxious and does not have insomnia.     Objective  BP 128/86  Pulse 80  Temp(Src) 98.1 F (36.7 C) (Oral)  Ht 5\' 7"  (1.702 m)  Wt 316 lb 1.9 oz (143.391 kg)  BMI 49.50 kg/m2  SpO2 95%  LMP 06/24/2013  Physical Exam  Physical Exam  Constitutional: She is oriented to person, place, and time and well-developed, well-nourished, and in no distress. No distress.  HENT:  Head: Normocephalic and atraumatic.  Eyes: Conjunctivae are normal.  Neck: Neck supple. No thyromegaly present.  Cardiovascular: Normal rate, regular rhythm and normal heart sounds.   No murmur heard. Pulmonary/Chest: Effort normal and breath sounds normal. She has no wheezes.  Abdominal: She exhibits no distension and no mass.  Musculoskeletal: She exhibits no edema.  Lymphadenopathy:    She has no cervical adenopathy.  Neurological: She is alert and oriented to person, place, and time.  Skin: Skin is warm and dry. No rash noted. She is not diaphoretic.  Psychiatric: Memory, affect and judgment normal.    Lab Results  Component Value Date   TSH 1.321 03/28/2013   Lab Results  Component Value Date   WBC 5.3 05/04/2013   HGB 13.9 05/04/2013   HCT 41.2 05/04/2013   MCV 76.4* 05/04/2013   PLT 186 05/04/2013   Lab Results  Component Value Date   CREATININE 0.82 05/04/2013   BUN 13 05/04/2013   NA 137 05/04/2013   K 4.0 05/04/2013   CL 101 05/04/2013   CO2 21 05/04/2013   Lab Results  Component Value Date   ALT 10 03/21/2013   AST 21 03/21/2013   ALKPHOS 95 03/21/2013   BILITOT 0.8 03/21/2013   Lab Results  Component Value Date   CHOL 210* 12/16/2012   Lab Results  Component Value Date   HDL 51 12/16/2012   Lab Results  Component Value Date   LDLCALC 133* 12/16/2012   Lab Results  Component Value Date   TRIG 132 12/16/2012    Lab Results  Component Value Date   CHOLHDL 4.1 12/16/2012     Assessment & Plan  HTN (hypertension) Well controlled, no changes to meds. Encouraged heart healthy diet such as the DASH diet and exercise as tolerated.   Blastocystis hominis infection Improving some after treatment but not fully recovered, encouraged bland diet, probiotics and has consultation arranged with infectious disease if no improvement  Pneumonia resolved  Candidiasis Treated with Diflucan

## 2013-07-17 NOTE — Assessment & Plan Note (Signed)
Improving some after treatment but not fully recovered, encouraged bland diet, probiotics and has consultation arranged with infectious disease if no improvement

## 2013-08-09 ENCOUNTER — Ambulatory Visit: Payer: 59 | Admitting: Family Medicine

## 2013-08-30 ENCOUNTER — Ambulatory Visit (INDEPENDENT_AMBULATORY_CARE_PROVIDER_SITE_OTHER): Payer: 59 | Admitting: Gastroenterology

## 2013-08-30 ENCOUNTER — Encounter: Payer: Self-pay | Admitting: Gastroenterology

## 2013-08-30 VITALS — BP 160/102 | HR 76 | Ht 66.5 in | Wt 318.2 lb

## 2013-08-30 DIAGNOSIS — R197 Diarrhea, unspecified: Secondary | ICD-10-CM

## 2013-08-30 NOTE — Progress Notes (Signed)
HPI: This is a  very pleasant 48 year old woman who I am meeting for the first time today.  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  Was haivng terrible diarrhea 06/2013, lasted several weeks, was going 10 times per day, + nocturnal, + urgency.  She had been on antibiotics twice a few months prior (january and February).  Had trouble with diarrhea for 1-2 about 3 times per year.  Never thought too much about it, never bloody.  Since abx she has been fine.  No recurrent diarrhea, back to her usual, daily solid brown formed stool.  She lost weight during diarrhea, but has gained it all back since then.  Considering bariatric surgery of some sort.  Review of systems: Pertinent positive and negative review of systems were noted in the above HPI section. Complete review of systems was performed and was otherwise normal.    Past Medical History  Diagnosis Date  . Hypertension   . Chicken pox as a child  . Hyperlipidemia   . Vitamin D deficiency   . Thyroid disease   . Allergy   . Hyperglycemia   . Preventative health care 12/18/2012    Has seen Dr Carlota Raspberry for GYN in past  . Type II or unspecified type diabetes mellitus without mention of complication, not stated as uncontrolled   . Cervical cancer screening 01/25/2013  . Anxiety and depression 01/30/2013  . Goiter     Mildly enlarged thyroid TSH normal   . Obesity, unspecified 04/26/2013  . Pneumonia 05/02/2013  . 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 None    . Wisdom tooth extraction  48 yrs old    Current Outpatient Prescriptions  Medication Sig Dispense Refill  . losartan (COZAAR) 100 MG tablet Take 1 tablet (100 mg total) by mouth daily.  30 tablet  3  . nebivolol (BYSTOLIC) 10 MG tablet Take 2 tablets (20 mg total) by mouth daily.  60 tablet  5  .  triamterene-hydrochlorothiazide (MAXZIDE-25) 37.5-25 MG per tablet Take 1 tablet by mouth every morning.  30 tablet  0   No current facility-administered medications for this visit.    Allergies as of 08/30/2013  . (No Known Allergies)    Family History  Problem Relation Age of Onset  . Heart attack Maternal Grandfather 75  . Heart disease Maternal Grandfather   . Diabetes Maternal Grandmother     type 2  . Blindness Maternal Grandmother   . Hypertension Paternal Grandmother   . Stroke Paternal Grandfather   . Hypertension Sister   . Hypertension Sister     History   Social History  . Marital Status: Married    Spouse Name: N/A    Number of Children: 0  . Years of Education: N/A   Occupational History  . SALES [employer]    Social History Main Topics  . Smoking status: Never Smoker   . Smokeless tobacco: Never Used  . Alcohol Use: No     Comment: No Pork  . Drug Use: No  . Sexual Activity: Yes    Partners: Male    Birth Control/ Protection: None     Comment: lives with husband and [place], [place]   Other Topics Concern  . Not on file   Social History Narrative   No Pork  Physical Exam: BP 160/102  Pulse 76  Ht 5' 6.5" (1.689 m)  Wt 318 lb 4 oz (144.357 kg)  BMI 50.60 kg/m2  LMP 08/26/2013 Constitutional: generally well-appearing Psychiatric: alert and oriented x3 Eyes: extraocular movements intact Mouth: oral pharynx moist, no lesions Neck: supple no lymphadenopathy Cardiovascular: heart regular rate and rhythm Lungs: clear to auscultation bilaterally Abdomen: soft, nontender, nondistended, no obvious ascites, no peritoneal signs, normal bowel sounds Extremities: no lower extremity edema bilaterally Skin: no lesions on visible extremities    Assessment and plan: 48 y.o. female with  recent resolved diarrheal illness, obesity  Her diarrheal illness started within 1 or 2 months of antibiotics for pneumonia. It really seems like  that would've been Clostridium difficile however C. difficile by PCR was negative. She was found to have Blastocystis and was put on specific antibiotic treatment for it. Her symptoms improved very shortly after starting this antibiotic. I had to review up-to-date literature on Blastocystis and I explained to the patient that this is a very common stool finding, there is some debate about whether it is actually pathogenic. It seems as if that was pathogenic in her case however. She is clearly back to 100% normal now and I do not think she necessarily requires colonoscopy unless her diarrheal symptoms recur. She would therefore be due for colonoscopy for colon cancer screening around age 73 and we are put her in our recall system for that. She is also interested in bariatric program and will get in touch with the [facility] to learn more about it.

## 2013-08-30 NOTE — Patient Instructions (Signed)
Consider [facility]. Colon Cancer Screening with colonoscopy starts at age 47.  We will put you our recall system for that. If diarrhea recurs, call Dr. Ardis Hughs.

## 2013-09-08 ENCOUNTER — Ambulatory Visit: Payer: 59 | Admitting: Family Medicine

## 2013-09-08 ENCOUNTER — Telehealth: Payer: Self-pay | Admitting: Family Medicine

## 2013-09-08 DIAGNOSIS — Z0289 Encounter for other administrative examinations: Secondary | ICD-10-CM

## 2013-09-08 NOTE — Telephone Encounter (Signed)
Patient did not come for her appointment this morning

## 2013-09-08 NOTE — Telephone Encounter (Signed)
Please reschedule in next couple of months,

## 2013-09-09 ENCOUNTER — Encounter: Payer: Self-pay | Admitting: Family Medicine

## 2013-09-09 NOTE — Telephone Encounter (Signed)
Mailbox full, no show letter sent

## 2013-10-07 ENCOUNTER — Encounter: Payer: Self-pay | Admitting: Physician Assistant

## 2013-10-07 ENCOUNTER — Ambulatory Visit (INDEPENDENT_AMBULATORY_CARE_PROVIDER_SITE_OTHER): Payer: 59 | Admitting: Physician Assistant

## 2013-10-07 VITALS — BP 156/104 | HR 80 | Temp 98.1°F | Resp 16 | Ht 66.5 in | Wt 319.5 lb

## 2013-10-07 DIAGNOSIS — R0789 Other chest pain: Secondary | ICD-10-CM

## 2013-10-07 DIAGNOSIS — J209 Acute bronchitis, unspecified: Secondary | ICD-10-CM

## 2013-10-07 DIAGNOSIS — I10 Essential (primary) hypertension: Secondary | ICD-10-CM

## 2013-10-07 MED ORDER — HYDROCOD POLST-CHLORPHEN POLST 10-8 MG/5ML PO LQCR: 5.0000 mL | Freq: Two times a day (BID) | ORAL | Status: DC | PRN

## 2013-10-07 MED ORDER — AZITHROMYCIN 250 MG PO TABS: ORAL_TABLET | ORAL | Status: DC

## 2013-10-07 NOTE — Progress Notes (Signed)
Patient presents to clinic today for ER follow-up.  Patient was seen at [facility] with complaints of significantly elevated BP with chest pain. Workup including EKG, Troponin, CMP, CXR all negative for findings of ACS. EKG did reveal nonspecific ST changes.  Patient was admitted overnight for observation, but released due to unremarkable workup.  Patient told to restart her BP mediations.  Had outpatient EST scheduled but has not had at this point.  Since discharge from [facility], patient endorses taking her BP medications.  Is taking the majority of medications at bedtime.  BP in clinic today is 156/104.  Patient denies chest pain, lightheadedness, dizziness or palpitations.  Patient does c/o chest congestion, productive cough and postnasal drip that has been present for 2 weeks.  Denies fever, chills or aches.  Past Medical History  Diagnosis Date  . Hypertension   . Chicken pox as a child  . Hyperlipidemia   . Vitamin D deficiency   . Thyroid disease   . Allergy   . Hyperglycemia   . Preventative health care 12/18/2012    Has seen Dr Carlota Raspberry for GYN in past  . Type II or unspecified type diabetes mellitus without mention of complication, not stated as uncontrolled   . Cervical cancer screening 01/25/2013  . Anxiety and depression 01/30/2013  . Goiter     Mildly enlarged thyroid TSH normal   . Obesity, unspecified 04/26/2013  . Pneumonia 05/02/2013  . 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 losartan (COZAAR) 100 MG tablet Take 1 tablet (100 mg total) by mouth daily.  30 tablet  3  . nebivolol (BYSTOLIC) 10 MG tablet Take 2 tablets (20 mg total) by mouth daily.  60 tablet  5  . triamterene-hydrochlorothiazide (MAXZIDE-25) 37.5-25 MG per tablet Take 1 tablet by mouth every morning.  30 tablet  0   No current facility-administered medications on file prior to  visit.    No Known Allergies  Family History  Problem Relation Age of Onset  . Heart attack Maternal Grandfather 75  . Heart disease Maternal Grandfather   . Diabetes Maternal Grandmother     type 2  . Blindness Maternal Grandmother   . Hypertension Paternal Grandmother   . Stroke Paternal Grandfather   . Hypertension Sister   . Hypertension Sister     History   Social History  . Marital Status: Married    Spouse Name: N/A    Number of Children: 0  . Years of Education: N/A   Occupational History  . SALES [employer]    Social History Main Topics  . Smoking status: Never Smoker   . Smokeless tobacco: Never Used  . Alcohol Use: No  . Drug Use: No  . Sexual Activity: Yes    Partners: Male    Birth Control/ Protection: None     Comment: lives with husband and [place], [place]   Other Topics Concern  . None   Social History Narrative   No Pork         Review of Systems - See HPI.  All other ROS are negative.  BP 156/104  Pulse 80  Temp(Src) 98.1 F (36.7 C) (Oral)  Resp 16  Ht 5' 6.5" (1.689 m)  Wt 319 lb 8 oz (144.924 kg)  BMI 50.80 kg/m2  SpO2 98%  LMP 09/25/2013  Physical Exam  Vitals reviewed. Constitutional: She  is oriented to person, place, and time and well-developed, well-nourished, and in no distress.  HENT:  Head: Normocephalic and atraumatic.  Right Ear: External ear normal.  Left Ear: External ear normal.  Nose: Nose normal.  Mouth/Throat: Oropharynx is clear and moist. No oropharyngeal exudate.  TM within normal limits bilaterally.  Eyes: Conjunctivae are normal. Pupils are equal, round, and reactive to light.  Neck: Neck supple.  Cardiovascular: Normal rate, regular rhythm, normal heart sounds and intact distal pulses.   Pulmonary/Chest: Effort normal and breath sounds normal. No respiratory distress. She has no wheezes. She has no rales. She exhibits no tenderness.  Lymphadenopathy:    She has no cervical adenopathy.  Neurological:  She is alert and oriented to person, place, and time.  Skin: Skin is warm and dry. No rash noted.  Psychiatric: Affect normal.   Assessment/Plan: HTN (hypertension) Will continue current regimen.  Patient instructed to take medications in the daytime as BP is naturally higher in the day.  Limit Salt intake.  Follow-up with Cardiology as scheduled for EST.  If chest pain recurs, please proceed to the ER.  Acute bronchitis Rx Azithromycin.  Rx Tussionex.  Increase fluid intake.  Rest.  Plain Mucinex.  Probiotic. Place a humidifier in the bedroom.  Atypical chest pain Workup has ruled out ACS.  Patient has upcoming EST. Patient to take BP medications as directed.  Limit salt intake.  No exertion. Follow-up in 1 week for BP recheck.  Return to ER if symptoms recur.

## 2013-10-07 NOTE — Patient Instructions (Signed)
For BP -- Please take medications as directed in the day. Limit salt intake.  Rest.  Please follow-up with [facility] concerning stress test.  Your chest tenderness seems likely musculoskeletal in nature -- change your sleeping position.  Take Advil for soreness.  Avoid heavy lifting.   If symptoms recur, please proceed to the ER.  Follow-up on Monday with the nurse for a BP recheck, having taken your medications that morning.  For Bronchitis -- Take antibiotic as directed.  Increase fluid intake.  Rest. Use Tussionex for cough.  Place a humidifier in the bedroom.  Resume your allergy medications.

## 2013-10-07 NOTE — Progress Notes (Signed)
Pre visit review using our clinic review tool, if applicable. No additional management support is needed unless otherwise documented below in the visit note/SLS  

## 2013-10-10 ENCOUNTER — Ambulatory Visit: Payer: 59 | Admitting: Physician Assistant

## 2013-10-23 ENCOUNTER — Emergency Department (HOSPITAL_COMMUNITY)
Admission: EM | Admit: 2013-10-23 | Discharge: 2013-10-23 | Disposition: A | Discharge status: Home / Self Care | Payer: 59 | Attending: Emergency Medicine | Admitting: Emergency Medicine

## 2013-10-23 ENCOUNTER — Encounter (HOSPITAL_COMMUNITY): Payer: Self-pay | Admitting: Emergency Medicine

## 2013-10-23 DIAGNOSIS — Z8701 Personal history of pneumonia (recurrent): Secondary | ICD-10-CM | POA: Insufficient documentation

## 2013-10-23 DIAGNOSIS — Z8619 Personal history of other infectious and parasitic diseases: Secondary | ICD-10-CM | POA: Insufficient documentation

## 2013-10-23 DIAGNOSIS — S335XXA Sprain of ligaments of lumbar spine, initial encounter: Secondary | ICD-10-CM | POA: Insufficient documentation

## 2013-10-23 DIAGNOSIS — S39012A Strain of muscle, fascia and tendon of lower back, initial encounter: Secondary | ICD-10-CM

## 2013-10-23 DIAGNOSIS — IMO0002 Reserved for concepts with insufficient information to code with codable children: Secondary | ICD-10-CM | POA: Diagnosis present

## 2013-10-23 DIAGNOSIS — E669 Obesity, unspecified: Secondary | ICD-10-CM | POA: Diagnosis not present

## 2013-10-23 DIAGNOSIS — Z79899 Other long term (current) drug therapy: Secondary | ICD-10-CM | POA: Diagnosis not present

## 2013-10-23 DIAGNOSIS — X503XXA Overexertion from repetitive movements, initial encounter: Secondary | ICD-10-CM | POA: Diagnosis not present

## 2013-10-23 DIAGNOSIS — Y9289 Other specified places as the place of occurrence of the external cause: Secondary | ICD-10-CM | POA: Diagnosis not present

## 2013-10-23 DIAGNOSIS — Y9389 Activity, other specified: Secondary | ICD-10-CM | POA: Diagnosis not present

## 2013-10-23 DIAGNOSIS — E119 Type 2 diabetes mellitus without complications: Secondary | ICD-10-CM | POA: Insufficient documentation

## 2013-10-23 DIAGNOSIS — X500XXA Overexertion from strenuous movement or load, initial encounter: Secondary | ICD-10-CM | POA: Insufficient documentation

## 2013-10-23 DIAGNOSIS — Z792 Long term (current) use of antibiotics: Secondary | ICD-10-CM | POA: Insufficient documentation

## 2013-10-23 DIAGNOSIS — I10 Essential (primary) hypertension: Secondary | ICD-10-CM | POA: Insufficient documentation

## 2013-10-23 MED ORDER — CYCLOBENZAPRINE HCL 10 MG PO TABS: 10.0000 mg | ORAL_TABLET | Freq: Every day | ORAL | Status: DC

## 2013-10-23 MED ORDER — IBUPROFEN 800 MG PO TABS: 800.0000 mg | ORAL_TABLET | Freq: Three times a day (TID) | ORAL | Status: DC | PRN

## 2013-10-23 MED ORDER — HYDROCODONE-ACETAMINOPHEN 5-325 MG PO TABS: 1.0000 | ORAL_TABLET | Freq: Four times a day (QID) | ORAL | Status: DC | PRN

## 2013-10-23 NOTE — ED Notes (Signed)
Pt presents to department for evaluation of back pain. States she has recently been lifting and moving heavy boxes. Pt states R sided lower back discomfort. 5/10 pain at the time. Pt is alert and oriented x4. NAD.

## 2013-10-23 NOTE — ED Notes (Signed)
Pt states R lower back pain, onset after moving boxes this week. 5/10 pain at the time.

## 2013-10-23 NOTE — ED Provider Notes (Signed)
CSN: [id number]     Arrival date & time 10/23/13  1045 History  This chart was scribed for non-physician practitioner Irena Cords, PA-C working with [place], DO by Ludger Nutting, ED Scribe. This patient was seen in room TR10C/TR10C and the patient's care was started at 11:48 AM.    Chief Complaint  Patient presents with  . Back Pain    The history is provided by the patient. No language interpreter was used.    HPI Comments: Jennifer Rich is a 48 y.o. female who presents to the Emergency Department complaining of constant, gradually worsened right sided lower back pain that began yesterday. Patient states she is moving which has required heavy lifting and pulling. She rates her pain as 5/10 currently. She denies radiation to the lower extremities. She denies weakness, numbness, bowel/bladder incontinence.    Past Medical History  Diagnosis Date  . Hypertension   . Chicken pox as a child  . Hyperlipidemia   . Vitamin D deficiency   . Thyroid disease   . Allergy   . Hyperglycemia   . Preventative health care 12/18/2012    Has seen Dr Carlota Raspberry for GYN in past  . Type II or unspecified type diabetes mellitus without mention of complication, not stated as uncontrolled   . Cervical cancer screening 01/25/2013  . Anxiety and depression 01/30/2013  . Goiter     Mildly enlarged thyroid TSH normal   . Obesity, unspecified 04/26/2013  . Pneumonia 05/02/2013  . 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 None    . Wisdom tooth extraction  48 yrs old   Family History  Problem Relation Age of Onset  . Heart attack Maternal Grandfather 75  . Heart disease Maternal Grandfather   . Diabetes Maternal Grandmother     type 2  . Blindness Maternal Grandmother   . Hypertension Paternal Grandmother   . Stroke Paternal Grandfather   . Hypertension Sister   . Hypertension Sister    History  Substance Use  Topics  . Smoking status: Never Smoker   . Smokeless tobacco: Never Used  . Alcohol Use: No   OB History   Grav Para Term Preterm Abortions TAB SAB Ect Mult Living                 Review of Systems  Musculoskeletal: Positive for back pain. Negative for neck pain.  Neurological: Negative for weakness and numbness.      Allergies  Review of patient's allergies indicates no known allergies.  Home Medications   Prior to Admission medications   Medication Sig Start Date End Date Taking? Authorizing Provider  acetaminophen (TYLENOL) 500 MG tablet Take 500 mg by mouth every 6 (six) hours as needed.    Historical Provider, MD  azithromycin (ZITHROMAX) 250 MG tablet Take 2 tablets on day 1.  Then take 1 tablet daily. 10/07/13   Brunetta Jeans, PA-C  chlorpheniramine-HYDROcodone (TUSSIONEX) 10-8 MG/5ML LQCR Take 5 mLs by mouth every 12 (twelve) hours as needed for cough (cough, will cause drowsiness.). 10/07/13   Brunetta Jeans, PA-C  losartan (COZAAR) 100 MG tablet Take 1 tablet (100 mg total) by mouth daily. 04/26/13   Mosie Lukes, MD  nebivolol (BYSTOLIC) 10 MG tablet Take 2 tablets (20 mg total) by mouth daily. 06/28/13   Mosie Lukes, MD  triamterene-hydrochlorothiazide (MAXZIDE-25) 37.5-25 MG per tablet Take 1 tablet  by mouth every morning. 05/23/13   Mosie Lukes, MD   BP 135/98  Pulse 86  Temp(Src) 98.4 F (36.9 C) (Oral)  Resp 18  Ht 5\' 7"  (1.702 m)  Wt 319 lb (144.697 kg)  BMI 49.95 kg/m2  SpO2 99%  LMP 09/25/2013 Physical Exam  Nursing note and vitals reviewed. Constitutional: She is oriented to person, place, and time. She appears well-developed and well-nourished.  HENT:  Head: Normocephalic and atraumatic.  Cardiovascular: Normal rate.   Pulmonary/Chest: Effort normal.  Abdominal: She exhibits no distension.  Neurological: She is alert and oriented to person, place, and time. She has normal reflexes. She exhibits normal muscle tone. Coordination normal.   Skin: Skin is warm and dry.  Psychiatric: She has a normal mood and affect.    ED Course  Procedures (including critical care time)  DIAGNOSTIC STUDIES: Oxygen Saturation is 98% on RA, normal by my interpretation.    COORDINATION OF CARE: 11:51 AM Discussed treatment plan with pt at bedside and pt agreed to plan.   I personally performed the services described in this documentation, which was scribed in my presence. The recorded information has been reviewed and is accurate.   Patient will be treated for lumbar strain, and advised to return here as needed.  Advised to follow up with her primary care Dr. patient has no neurological deficits and normal strength in the lower extremities  Brent General, PA-C 10/23/13 1203

## 2013-10-23 NOTE — ED Provider Notes (Signed)
Medical screening examination/treatment/procedure(s) were performed by non-physician practitioner and as supervising physician I was immediately available for consultation/collaboration.   EKG Interpretation None        Delice Bison Meilin Brosh, DO 10/23/13 1334

## 2013-10-23 NOTE — Discharge Instructions (Signed)
Return here as needed.  Use ice and heat on your lower back.  Followup with your primary care Dr.

## 2013-10-31 DIAGNOSIS — J209 Acute bronchitis, unspecified: Secondary | ICD-10-CM | POA: Insufficient documentation

## 2013-10-31 DIAGNOSIS — R0789 Other chest pain: Secondary | ICD-10-CM | POA: Insufficient documentation

## 2013-10-31 NOTE — Assessment & Plan Note (Signed)
Workup has ruled out ACS.  Patient has upcoming EST. Patient to take BP medications as directed.  Limit salt intake.  No exertion. Follow-up in 1 week for BP recheck.  Return to ER if symptoms recur.

## 2013-10-31 NOTE — Assessment & Plan Note (Signed)
Will continue current regimen.  Patient instructed to take medications in the daytime as BP is naturally higher in the day.  Limit Salt intake.  Follow-up with Cardiology as scheduled for EST.  If chest pain recurs, please proceed to the ER.

## 2013-10-31 NOTE — Assessment & Plan Note (Signed)
Rx Azithromycin.  Rx Tussionex.  Increase fluid intake.  Rest.  Plain Mucinex.  Probiotic. Place a humidifier in the bedroom.

## 2013-11-02 ENCOUNTER — Encounter: Payer: Self-pay | Admitting: Physician Assistant

## 2013-11-02 ENCOUNTER — Encounter: Payer: Self-pay | Admitting: Family Medicine

## 2013-11-02 ENCOUNTER — Ambulatory Visit (INDEPENDENT_AMBULATORY_CARE_PROVIDER_SITE_OTHER): Payer: 59 | Admitting: Physician Assistant

## 2013-11-02 VITALS — BP 138/80 | HR 80 | Temp 97.8°F | Wt 320.0 lb

## 2013-11-02 DIAGNOSIS — I10 Essential (primary) hypertension: Secondary | ICD-10-CM | POA: Insufficient documentation

## 2013-11-02 DIAGNOSIS — R0789 Other chest pain: Secondary | ICD-10-CM

## 2013-11-02 MED ORDER — AMLODIPINE BESYLATE 2.5 MG PO TABS: 2.5000 mg | ORAL_TABLET | Freq: Every day | ORAL | Status: DC

## 2013-11-02 NOTE — Progress Notes (Signed)
Patient presents to clinic today for follow-up of hypertension.  Patient endorses taking medications as directed.  Is doing better now that her Nebivolol has been increased to 20 mg BID.  Has been having significant stress due to home situation.  Has had episodes of BPs in the 160s/100s associated with lightheadedness.  Denies chest pain, palpitations, SOB or syncope. States she was never contacted by [facility] for her EST.  Wishes to have it done through [facility]. Otherwise patient is doing well.  Past Medical History  Diagnosis Date  . Hypertension   . Chicken pox as a child  . Hyperlipidemia   . Vitamin D deficiency   . Thyroid disease   . Allergy   . Hyperglycemia   . Preventative health care 12/18/2012    Has seen Dr Carlota Raspberry for GYN in past  . Type II or unspecified type diabetes mellitus without mention of complication, not stated as uncontrolled   . Cervical cancer screening 01/25/2013  . Anxiety and depression 01/30/2013  . Goiter     Mildly enlarged thyroid TSH normal   . Obesity, unspecified 04/26/2013  . Pneumonia 05/02/2013  . 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 acetaminophen (TYLENOL) 500 MG tablet Take 500 mg by mouth every 6 (six) hours as needed.      Marland Kitchen losartan (COZAAR) 100 MG tablet Take 1 tablet (100 mg total) by mouth daily.  30 tablet  3  . nebivolol (BYSTOLIC) 10 MG tablet Take 2 tablets (20 mg total) by mouth daily.  60 tablet  5  . triamterene-hydrochlorothiazide (MAXZIDE-25) 37.5-25 MG per tablet Take 1 tablet by mouth every morning.  30 tablet  0  . cyclobenzaprine (FLEXERIL) 10 MG tablet Take 1 tablet (10 mg total) by mouth at bedtime.  7 tablet  0  . HYDROcodone-acetaminophen (NORCO/VICODIN) 5-325 MG per tablet Take 1 tablet by mouth every 6 (six) hours as needed for moderate pain.  20 tablet  0  . ibuprofen (ADVIL,MOTRIN) 800 MG tablet Take 1  tablet (800 mg total) by mouth every 8 (eight) hours as needed.  30 tablet  0   No current facility-administered medications on file prior to visit.    No Known Allergies  Family History  Problem Relation Age of Onset  . Heart attack Maternal Grandfather 75  . Heart disease Maternal Grandfather   . Diabetes Maternal Grandmother     type 2  . Blindness Maternal Grandmother   . Hypertension Paternal Grandmother   . Stroke Paternal Grandfather   . Hypertension Sister   . Hypertension Sister     History   Social History  . Marital Status: Married    Spouse Name: N/A    Number of Children: 0  . Years of Education: N/A   Occupational History  . SALES [employer]    Social History Main Topics  . Smoking status: Never Smoker   . Smokeless tobacco: Never Used  . Alcohol Use: No  . Drug Use: No  . Sexual Activity: Yes    Partners: Male    Birth Control/ Protection: None     Comment: lives with husband and [place], [place]   Other Topics Concern  . None   Social History Narrative   No Pork         Review of Systems - See HPI.  All other ROS are negative.  BP 138/80  Pulse 80  Temp(Src) 97.8 F (36.6 C)  Wt 320 lb (145.151 kg)  SpO2 100%  LMP 09/25/2013  Physical Exam  Vitals reviewed. Constitutional: She is oriented to person, place, and time.  Well-developed, morbidly obese AA female in no acute distress.  HENT:  Head: Normocephalic and atraumatic.  Eyes: Conjunctivae and EOM are normal. Pupils are equal, round, and reactive to light.  Neck: Neck supple.  Cardiovascular: Normal rate, regular rhythm, normal heart sounds and intact distal pulses.   Pulmonary/Chest: Effort normal and breath sounds normal. No respiratory distress. She has no wheezes. She has no rales. She exhibits no tenderness.  Neurological: She is alert and oriented to person, place, and time.  Skin: Skin is warm and dry. No rash noted.  Psychiatric: Affect normal.    No results found for  this or any previous visit (from the past 2160 hour(s)).  Assessment/Plan: Essential hypertension, benign Will add amlodipine 2.5 mg.  Continue other medications as directed.  DASH diet.  EST placed as [facility] never contacted patient regarding this.  Follow-up in 1 month.  Atypical chest pain No pain at present. Order for EST placed as patient was never contacted by [facility] for the study the ER MD had ordered.

## 2013-11-02 NOTE — Assessment & Plan Note (Signed)
No pain at present. Order for EST placed as patient was never contacted by [facility] for the study the ER MD had ordered.

## 2013-11-02 NOTE — Assessment & Plan Note (Signed)
Will add amlodipine 2.5 mg.  Continue other medications as directed.  DASH diet.  EST placed as [facility] never contacted patient regarding this.  Follow-up in 1 month.

## 2013-11-02 NOTE — Patient Instructions (Signed)
Please continue medications as directed, adding the amlodipine.  You are to take 1 tablet by mouth daily.  Continue watching diet, especially salt intake.  I am glad everything is going well with your grandchildren.  Follow-up in 1 month.    You will be contacted for a stress test.  I will call you with your results.

## 2013-11-02 NOTE — Progress Notes (Signed)
Pre visit review using our clinic review tool, if applicable. No additional management support is needed unless otherwise documented below in the visit note. 

## 2013-11-08 ENCOUNTER — Emergency Department (HOSPITAL_BASED_OUTPATIENT_CLINIC_OR_DEPARTMENT_OTHER): Payer: 59

## 2013-11-08 ENCOUNTER — Emergency Department (HOSPITAL_BASED_OUTPATIENT_CLINIC_OR_DEPARTMENT_OTHER)
Admission: EM | Admit: 2013-11-08 | Discharge: 2013-11-08 | Disposition: A | Discharge status: Home / Self Care | Payer: 59 | Attending: Emergency Medicine | Admitting: Emergency Medicine

## 2013-11-08 ENCOUNTER — Encounter (HOSPITAL_BASED_OUTPATIENT_CLINIC_OR_DEPARTMENT_OTHER): Payer: Self-pay | Admitting: Emergency Medicine

## 2013-11-08 DIAGNOSIS — R059 Cough, unspecified: Secondary | ICD-10-CM | POA: Diagnosis present

## 2013-11-08 DIAGNOSIS — Z8619 Personal history of other infectious and parasitic diseases: Secondary | ICD-10-CM | POA: Diagnosis not present

## 2013-11-08 DIAGNOSIS — R05 Cough: Secondary | ICD-10-CM | POA: Diagnosis present

## 2013-11-08 DIAGNOSIS — Z8659 Personal history of other mental and behavioral disorders: Secondary | ICD-10-CM | POA: Diagnosis not present

## 2013-11-08 DIAGNOSIS — E669 Obesity, unspecified: Secondary | ICD-10-CM | POA: Diagnosis not present

## 2013-11-08 DIAGNOSIS — E119 Type 2 diabetes mellitus without complications: Secondary | ICD-10-CM | POA: Diagnosis not present

## 2013-11-08 DIAGNOSIS — Z79899 Other long term (current) drug therapy: Secondary | ICD-10-CM | POA: Diagnosis not present

## 2013-11-08 DIAGNOSIS — I10 Essential (primary) hypertension: Secondary | ICD-10-CM | POA: Insufficient documentation

## 2013-11-08 DIAGNOSIS — J209 Acute bronchitis, unspecified: Secondary | ICD-10-CM | POA: Diagnosis not present

## 2013-11-08 MED ORDER — ALBUTEROL SULFATE HFA 108 (90 BASE) MCG/ACT IN AERS
INHALATION_SPRAY | RESPIRATORY_TRACT | Status: AC
  Filled 2013-11-08: qty 6.7

## 2013-11-08 MED ORDER — ALBUTEROL SULFATE HFA 108 (90 BASE) MCG/ACT IN AERS
2.0000 | INHALATION_SPRAY | RESPIRATORY_TRACT | Status: DC | PRN
  Administered 2013-11-08: 2 via RESPIRATORY_TRACT

## 2013-11-08 MED ORDER — AZITHROMYCIN 250 MG PO TABS: ORAL_TABLET | ORAL | Status: DC

## 2013-11-08 MED ORDER — ALBUTEROL SULFATE (2.5 MG/3ML) 0.083% IN NEBU
5.0000 mg | INHALATION_SOLUTION | Freq: Once | RESPIRATORY_TRACT | Status: AC
Start: 2013-11-08 — End: 2013-11-08
  Administered 2013-11-08: 5 mg via RESPIRATORY_TRACT
  Filled 2013-11-08: qty 6

## 2013-11-08 MED ORDER — PREDNISONE 10 MG PO TABS: 20.0000 mg | ORAL_TABLET | Freq: Two times a day (BID) | ORAL | Status: DC

## 2013-11-08 NOTE — ED Notes (Signed)
Patient states she woke up two days ago with a cough and cold symptoms.  States the cough has progressed to a productive cough with brownish secretions and are associated with sinus congestion, drainage and a fever.

## 2013-11-08 NOTE — ED Notes (Signed)
MD made aware of BP. No orders received

## 2013-11-08 NOTE — ED Provider Notes (Signed)
CSN: [id number]     Arrival date & time 11/08/13  7829 History   First MD Initiated Contact with Patient 11/08/13 [phone]     Chief Complaint  Patient presents with  . Cough     (Consider location/radiation/quality/duration/timing/severity/associated sxs/prior Treatment) HPI Comments: Patient is a 48 year old female with history of hypertension and morbid obesity. She presents today with complaints of chest congestion and productive cough for the past 2 days. She denies having checked her temperature at home but has felt fevered.  Patient is a 48 y.o. female presenting with cough. The history is provided by the patient.  Cough Cough characteristics:  Productive Sputum characteristics:  Yellow Severity:  Moderate Onset quality:  Sudden Duration:  2 days Timing:  Constant Progression:  Worsening Chronicity:  New Smoker: no   Relieved by:  Nothing Worsened by:  Nothing tried Ineffective treatments:  None tried   Past Medical History  Diagnosis Date  . Hypertension   . Chicken pox as a child  . Hyperlipidemia   . Vitamin D deficiency   . Thyroid disease   . Allergy   . Hyperglycemia   . Preventative health care 12/18/2012    Has seen Dr Carlota Raspberry for GYN in past  . Type II or unspecified type diabetes mellitus without mention of complication, not stated as uncontrolled   . Cervical cancer screening 01/25/2013  . Anxiety and depression 01/30/2013  . Goiter     Mildly enlarged thyroid TSH normal   . Obesity, unspecified 04/26/2013  . Pneumonia 05/02/2013  . 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 None    . Wisdom tooth extraction  48 yrs old   Family History  Problem Relation Age of Onset  . Heart attack Maternal Grandfather 75  . Heart disease Maternal Grandfather   . Diabetes Maternal Grandmother     type 2  . Blindness Maternal Grandmother   . Hypertension Paternal Grandmother   . Stroke  Paternal Grandfather   . Hypertension Sister   . Hypertension Sister    History  Substance Use Topics  . Smoking status: Never Smoker   . Smokeless tobacco: Never Used  . Alcohol Use: No   OB History   Grav Para Term Preterm Abortions TAB SAB Ect Mult Living                 Review of Systems  Respiratory: Positive for cough.   All other systems reviewed and are negative.     Allergies  Review of patient's allergies indicates no known allergies.  Home Medications   Prior to Admission medications   Medication Sig Start Date End Date Taking? Authorizing Provider  acetaminophen (TYLENOL) 500 MG tablet Take 500 mg by mouth every 6 (six) hours as needed.   Yes Historical Provider, MD  amLODipine (NORVASC) 2.5 MG tablet Take 1 tablet (2.5 mg total) by mouth daily. 11/02/13  Yes Brunetta Jeans, PA-C  ibuprofen (ADVIL,MOTRIN) 800 MG tablet Take 1 tablet (800 mg total) by mouth every 8 (eight) hours as needed. 10/23/13  Yes Resa Miner Lawyer, PA-C  losartan (COZAAR) 100 MG tablet Take 1 tablet (100 mg total) by mouth daily. 04/26/13  Yes Mosie Lukes, MD  nebivolol (BYSTOLIC) 10 MG tablet Take 2 tablets (20 mg total) by mouth daily. 06/28/13  Yes Mosie Lukes, MD  triamterene-hydrochlorothiazide (MAXZIDE-25) 37.5-25 MG per tablet Take 1 tablet by mouth every  morning. 05/23/13  Yes Mosie Lukes, MD  cyclobenzaprine (FLEXERIL) 10 MG tablet Take 1 tablet (10 mg total) by mouth at bedtime. 10/23/13   Brent General, PA-C  HYDROcodone-acetaminophen (NORCO/VICODIN) 5-325 MG per tablet Take 1 tablet by mouth every 6 (six) hours as needed for moderate pain. 10/23/13   Resa Miner Lawyer, PA-C   BP 210/104  Pulse 80  Temp(Src) 98.5 F (36.9 C) (Oral)  Resp 24  Ht 5\' 7"  (1.702 m)  Wt 315 lb (142.883 kg)  BMI 49.32 kg/m2  SpO2 94%  LMP 10/26/2013 Physical Exam  Nursing note and vitals reviewed. Constitutional: She is oriented to person, place, and time. She appears  well-developed and well-nourished. No distress.  HENT:  Head: Normocephalic and atraumatic.  Neck: Normal range of motion. Neck supple.  Cardiovascular: Normal rate and regular rhythm.  Exam reveals no gallop and no friction rub.   No murmur heard. Pulmonary/Chest: Effort normal. No respiratory distress. She has no wheezes.  There are bilateral rhonchi present with expiration.  Abdominal: Soft. Bowel sounds are normal. She exhibits no distension. There is no tenderness.  Musculoskeletal: Normal range of motion.  Neurological: She is alert and oriented to person, place, and time.  Skin: Skin is warm and dry. She is not diaphoretic.    ED Course  Procedures (including critical care time) Labs Review Labs Reviewed - No data to display  Imaging Review No results found.   EKG Interpretation None      MDM   Final diagnoses:  None    Patient presents with complaints of chest congestion and productive cough for the past 2 days. She arrived here with rhonchi and wheezes. These improved with an albuterol neb. Chest x-ray reveals no evidence for acute infiltrate, however I feel as though her presentation is consistent with an acute bronchitis. She may well have some underlying reactive airway disease as well. She will be treated with prednisone, Zithromax and an albuterol inhaler. She understands to return if her symptoms worsen or change.    Veryl Speak, MD 11/08/13 1030

## 2013-11-08 NOTE — Discharge Instructions (Signed)
Zithromax and prednisone as prescribed.  Albuterol inhaler: 2 puffs every 4 hours as needed for wheezing.  Return to the emergency department if he develops severe difficulty breathing, chest pain, or other new and concerning symptoms.   Acute Bronchitis Bronchitis is inflammation of the airways that extend from the windpipe into the lungs (bronchi). The inflammation often causes mucus to develop. This leads to a cough, which is the most common symptom of bronchitis.  In acute bronchitis, the condition usually develops suddenly and goes away over time, usually in a couple weeks. Smoking, allergies, and asthma can make bronchitis worse. Repeated episodes of bronchitis may cause further lung problems.  CAUSES Acute bronchitis is most often caused by the same virus that causes a cold. The virus can spread from person to person (contagious) through coughing, sneezing, and touching contaminated objects. SIGNS AND SYMPTOMS   Cough.   Fever.   Coughing up mucus.   Body aches.   Chest congestion.   Chills.   Shortness of breath.   Sore throat.  DIAGNOSIS  Acute bronchitis is usually diagnosed through a physical exam. Your health care provider will also ask you questions about your medical history. Tests, such as chest X-rays, are sometimes done to rule out other conditions.  TREATMENT  Acute bronchitis usually goes away in a couple weeks. Oftentimes, no medical treatment is necessary. Medicines are sometimes given for relief of fever or cough. Antibiotic medicines are usually not needed but may be prescribed in certain situations. In some cases, an inhaler may be recommended to help reduce shortness of breath and control the cough. A cool mist vaporizer may also be used to help thin bronchial secretions and make it easier to clear the chest.  HOME CARE INSTRUCTIONS  Get plenty of rest.   Drink enough fluids to keep your urine clear or pale yellow (unless you have a medical  condition that requires fluid restriction). Increasing fluids may help thin your respiratory secretions (sputum) and reduce chest congestion, and it will prevent dehydration.   Take medicines only as directed by your health care provider.  If you were prescribed an antibiotic medicine, finish it all even if you start to feel better.  Avoid smoking and secondhand smoke. Exposure to cigarette smoke or irritating chemicals will make bronchitis worse. If you are a smoker, consider using nicotine gum or skin patches to help control withdrawal symptoms. Quitting smoking will help your lungs heal faster.   Reduce the chances of another bout of acute bronchitis by washing your hands frequently, avoiding people with cold symptoms, and trying not to touch your hands to your mouth, nose, or eyes.   Keep all follow-up visits as directed by your health care provider.  SEEK MEDICAL CARE IF: Your symptoms do not improve after 1 week of treatment.  SEEK IMMEDIATE MEDICAL CARE IF:  You develop an increased fever or chills.   You have chest pain.   You have severe shortness of breath.  You have bloody sputum.   You develop dehydration.  You faint or repeatedly feel like you are going to pass out.  You develop repeated vomiting.  You develop a severe headache. MAKE SURE YOU:   Understand these instructions.  Will watch your condition.  Will get help right away if you are not doing well or get worse. Document Released: 03/27/2004 Document Revised: 07/04/2013 Document Reviewed: 08/10/2012 [facility] Patient Information 2015 [place], [place]. This information is not intended to replace advice given to you by your health  care provider. Make sure you discuss any questions you have with your health care provider.

## 2013-11-08 NOTE — ED Notes (Signed)
MD at bedside. 

## 2013-11-14 ENCOUNTER — Ambulatory Visit: Payer: 59 | Admitting: Physician Assistant

## 2013-11-14 ENCOUNTER — Telehealth: Payer: Self-pay | Admitting: *Deleted

## 2013-11-14 DIAGNOSIS — Z0289 Encounter for other administrative examinations: Secondary | ICD-10-CM

## 2013-11-14 NOTE — Telephone Encounter (Signed)
Patient was to be seen for acute.  It is up to her to reschedule.  Patient should be charged for no-showing.

## 2013-11-14 NOTE — Telephone Encounter (Signed)
Pt did not show up for appointment 11/14/2013 at 4pm for cough, [place] patient, scheduled 11/14/2013 at 10:16am

## 2013-12-02 ENCOUNTER — Ambulatory Visit: Payer: 59 | Admitting: Family Medicine

## 2013-12-06 ENCOUNTER — Encounter: Payer: Self-pay | Admitting: Nurse Practitioner

## 2013-12-06 ENCOUNTER — Ambulatory Visit (INDEPENDENT_AMBULATORY_CARE_PROVIDER_SITE_OTHER): Payer: 59 | Admitting: Nurse Practitioner

## 2013-12-06 VITALS — HR 81

## 2013-12-06 DIAGNOSIS — R0789 Other chest pain: Secondary | ICD-10-CM

## 2013-12-06 DIAGNOSIS — I10 Essential (primary) hypertension: Secondary | ICD-10-CM

## 2013-12-06 MED ORDER — AMLODIPINE BESYLATE 5 MG PO TABS: 5.0000 mg | ORAL_TABLET | Freq: Every day | ORAL | Status: DC

## 2013-12-06 NOTE — Patient Instructions (Signed)
Increase the Norvasc to 5 mg a day - you make take two of your 2.5 mg tablets to make this dose - I have sent the RX for the 5mg  to your drug store  See your PCP in one to two weeks  Call the [place] office at [phone] if you have any questions, problems or concerns.

## 2013-12-06 NOTE — Progress Notes (Addendum)
Exercise Treadmill Test  Pre-Exercise Testing Evaluation Rhythm: normal sinus  Rate: 80     Test  Exercise Tolerance Test Ordering MD: Dr. Angelena Form  Interpreting MD: Truitt Merle, NP  Unique Test No: 1  Treadmill:  1  Indication for ETT: chest pain - rule out ischemia  Contraindication to ETT: No   Stress Modality: exercise - treadmill  Cardiac Imaging Performed: non   Protocol: standard Bruce - maximal  Max BP:  214/131  Max MPHR (bpm):  173 85% MPR (bpm):  147  MPHR obtained (bpm):  150 % MPHR obtained:  86%  Reached 85% MPHR (min:sec):  3:55 Total Exercise Time (min-sec):  4 minutes  Workload in METS:  5.7 Borg Scale: 15  Reason ETT Terminated:  patient's desire to stop    ST Segment Analysis At Rest: normal ST segments - no evidence of significant ST depression With Exercise: no evidence of significant ST depression  Other Information Arrhythmia:  Yes Angina during ETT:  absent (0) Quality of ETT:  diagnostic  ETT Interpretation:  normal - no evidence of ischemia by ST analysis  Comments: Patient presents today for routine GXT. Has had atypical chest pain.  She is morbidly obese and has HTN and HLD.  Echo in 2014 with normal EF.   Today the patient exercised on the standard Bruce protocol for a total of 4 minutes.  Poor exercise tolerance.  Hypertensive blood pressure response. A large cuff was used around her forearm due to the size of her upper arm and the large cuff not being able to fit.  Clinically negative for chest pain. Test was stopped due to fatigue/achievement of target HR.  EKG negative for ischemia. Has frequent PVCs noted. No significant arrhythmia. No medicines held for today's study  Recommendations: CV risk factor modification  Norvasc increased to 5 mg a day  Follow up with PCP in one to 2 weeks to reassess her BP  We will see back prn.  Patient is agreeable to this plan and will call if any problems develop in the interim.   Burtis Junes,  RN, [facility] [address] [place], [place]  [id number] [phone]

## 2013-12-16 ENCOUNTER — Other Ambulatory Visit: Payer: Self-pay

## 2013-12-27 ENCOUNTER — Ambulatory Visit (INDEPENDENT_AMBULATORY_CARE_PROVIDER_SITE_OTHER): Payer: 59

## 2013-12-27 ENCOUNTER — Telehealth: Payer: Self-pay

## 2013-12-27 ENCOUNTER — Ambulatory Visit: Payer: 59

## 2013-12-27 VITALS — BP 145/93 | HR 63

## 2013-12-27 DIAGNOSIS — Z23 Encounter for immunization: Secondary | ICD-10-CM

## 2013-12-27 NOTE — Telephone Encounter (Signed)
Work note provided for today.

## 2013-12-27 NOTE — Patient Instructions (Signed)
Please take Bystolic 20 mg in the morning and 10 mg in the evening and return back to clinic in 2 weeks.

## 2013-12-27 NOTE — Progress Notes (Signed)
Pt's blood pressures reviewed with Dr. Charlett Blake.  Verbal orders were given for patient to take Bystolic 20 mg (2 tablets) in the am and 10 mg (1 tablet) in the pm for a total of 30 mg daily, and returned back to clinic in 2 weeks.  The same was relayed to the patient. The patient stated understanding.  No further questions or concerns voiced.

## 2014-01-03 ENCOUNTER — Other Ambulatory Visit: Payer: Self-pay

## 2014-01-03 DIAGNOSIS — Z1231 Encounter for screening mammogram for malignant neoplasm of breast: Secondary | ICD-10-CM

## 2014-01-20 ENCOUNTER — Other Ambulatory Visit: Payer: Self-pay | Admitting: Family Medicine

## 2014-01-22 ENCOUNTER — Encounter (HOSPITAL_COMMUNITY): Payer: Self-pay | Admitting: Emergency Medicine

## 2014-01-22 ENCOUNTER — Emergency Department (HOSPITAL_COMMUNITY): Payer: 59

## 2014-01-22 ENCOUNTER — Emergency Department (HOSPITAL_COMMUNITY)
Admission: EM | Admit: 2014-01-22 | Discharge: 2014-01-22 | Disposition: A | Discharge status: Home / Self Care | Payer: 59 | Attending: Emergency Medicine | Admitting: Emergency Medicine

## 2014-01-22 DIAGNOSIS — Z79899 Other long term (current) drug therapy: Secondary | ICD-10-CM | POA: Diagnosis not present

## 2014-01-22 DIAGNOSIS — Z792 Long term (current) use of antibiotics: Secondary | ICD-10-CM | POA: Insufficient documentation

## 2014-01-22 DIAGNOSIS — S99912A Unspecified injury of left ankle, initial encounter: Secondary | ICD-10-CM | POA: Diagnosis present

## 2014-01-22 DIAGNOSIS — Z8619 Personal history of other infectious and parasitic diseases: Secondary | ICD-10-CM | POA: Diagnosis not present

## 2014-01-22 DIAGNOSIS — Y929 Unspecified place or not applicable: Secondary | ICD-10-CM | POA: Insufficient documentation

## 2014-01-22 DIAGNOSIS — Z7952 Long term (current) use of systemic steroids: Secondary | ICD-10-CM | POA: Diagnosis not present

## 2014-01-22 DIAGNOSIS — W19XXXA Unspecified fall, initial encounter: Secondary | ICD-10-CM

## 2014-01-22 DIAGNOSIS — Z8701 Personal history of pneumonia (recurrent): Secondary | ICD-10-CM | POA: Diagnosis not present

## 2014-01-22 DIAGNOSIS — I10 Essential (primary) hypertension: Secondary | ICD-10-CM | POA: Diagnosis not present

## 2014-01-22 DIAGNOSIS — Z8739 Personal history of other diseases of the musculoskeletal system and connective tissue: Secondary | ICD-10-CM | POA: Diagnosis not present

## 2014-01-22 DIAGNOSIS — X58XXXA Exposure to other specified factors, initial encounter: Secondary | ICD-10-CM | POA: Insufficient documentation

## 2014-01-22 DIAGNOSIS — Y998 Other external cause status: Secondary | ICD-10-CM | POA: Diagnosis not present

## 2014-01-22 DIAGNOSIS — E669 Obesity, unspecified: Secondary | ICD-10-CM | POA: Diagnosis not present

## 2014-01-22 DIAGNOSIS — Y9301 Activity, walking, marching and hiking: Secondary | ICD-10-CM | POA: Insufficient documentation

## 2014-01-22 DIAGNOSIS — E119 Type 2 diabetes mellitus without complications: Secondary | ICD-10-CM | POA: Diagnosis not present

## 2014-01-22 DIAGNOSIS — S93402A Sprain of unspecified ligament of left ankle, initial encounter: Secondary | ICD-10-CM | POA: Insufficient documentation

## 2014-01-22 DIAGNOSIS — Z8659 Personal history of other mental and behavioral disorders: Secondary | ICD-10-CM | POA: Insufficient documentation

## 2014-01-22 NOTE — ED Notes (Signed)
[facility] te[facility] at  bed side tol place cam walker

## 2014-01-22 NOTE — ED Notes (Signed)
Pt. Stated, I was walking to my car and my ankle just twisted.. I've elevated it and iced it and no better.

## 2014-01-22 NOTE — Progress Notes (Signed)
Orthopedic Tech Progress Note Patient Details:  Jennifer Rich 1965-03-20 [id number]  Ortho Devices Type of Ortho Device: Crutches, CAM walker Ortho Device/Splint Location: LLE Ortho Device/Splint Interventions: Application   Asia R Thompson 01/22/2014, 11:59 AM

## 2014-01-22 NOTE — Discharge Instructions (Signed)
Wear ankle brace for at least 1-2 weeks for stabilization of ankle, but only use the Cam Walker for walking at work and use a compression wrap at home. Use crutches as needed for comfort. Ice and elevate ankle throughout the day. Alternate between Aleve and tylenol as needed for pain relief. Call orthopedic follow up today or tomorrow to schedule followup appointment for recheck of ongoing ankle pain in 1-2 weeks that can be canceled with a 24-48 hour notice if complete resolution of pain. Return to the ER for changes or worsening symptoms.   Ankle Sprain An ankle sprain is an injury to the strong, fibrous tissues (ligaments) that hold the bones of your ankle joint together.  CAUSES An ankle sprain is usually caused by a fall or by twisting your ankle. Ankle sprains most commonly occur when you step on the outer edge of your foot, and your ankle turns inward. People who participate in sports are more prone to these types of injuries.  SYMPTOMS   Pain in your ankle. The pain may be present at rest or only when you are trying to stand or walk.  Swelling.  Bruising. Bruising may develop immediately or within 1 to 2 days after your injury.  Difficulty standing or walking, particularly when turning corners or changing directions. DIAGNOSIS  Your caregiver will ask you details about your injury and perform a physical exam of your ankle to determine if you have an ankle sprain. During the physical exam, your caregiver will press on and apply pressure to specific areas of your foot and ankle. Your caregiver will try to move your ankle in certain ways. An X-ray exam may be done to be sure a bone was not broken or a ligament did not separate from one of the bones in your ankle (avulsion fracture).  TREATMENT  Certain types of braces can help stabilize your ankle. Your caregiver can make a recommendation for this. Your caregiver may recommend the use of medicine for pain. If your sprain is severe, your  caregiver may refer you to a surgeon who helps to restore function to parts of your skeletal system (orthopedist) or a physical therapist. Byars ice to your injury for 1-2 days or as directed by your caregiver. Applying ice helps to reduce inflammation and pain.  Put ice in a plastic bag.  Place a towel between your skin and the bag.  Leave the ice on for 15-20 minutes at a time, every 2 hours while you are awake.  Only take over-the-counter or prescription medicines for pain, discomfort, or fever as directed by your caregiver.  Elevate your injured ankle above the level of your heart as much as possible for 2-3 days.  If your caregiver recommends crutches, use them as instructed. Gradually put weight on the affected ankle. Continue to use crutches or a cane until you can walk without feeling pain in your ankle.  If you have a plaster splint, wear the splint as directed by your caregiver. Do not rest it on anything harder than a pillow for the first 24 hours. Do not put weight on it. Do not get it wet. You may take it off to take a shower or bath.  You may have been given an elastic bandage to wear around your ankle to provide support. If the elastic bandage is too tight (you have numbness or tingling in your foot or your foot becomes cold and blue), adjust the bandage to make it comfortable.  If you have an air splint, you may blow more air into it or let air out to make it more comfortable. You may take your splint off at night and before taking a shower or bath. Wiggle your toes in the splint several times per day to decrease swelling. SEEK MEDICAL CARE IF:   You have rapidly increasing bruising or swelling.  Your toes feel extremely cold or you lose feeling in your foot.  Your pain is not relieved with medicine. SEEK IMMEDIATE MEDICAL CARE IF:  Your toes are numb or blue.  You have severe pain that is increasing. MAKE SURE YOU:   Understand these  instructions.  Will watch your condition.  Will get help right away if you are not doing well or get worse. Document Released: 02/17/2005 Document Revised: 11/12/2011 Document Reviewed: 03/01/2011 [facility] Patient Information 2015 [place], [place]. This information is not intended to replace advice given to you by your health care provider. Make sure you discuss any questions you have with your health care provider.  Cryotherapy Cryotherapy means treatment with cold. Ice or gel packs can be used to reduce both pain and swelling. Ice is the most helpful within the first 24 to 48 hours after an injury or flare-up from overusing a muscle or joint. Sprains, strains, spasms, burning pain, shooting pain, and aches can all be eased with ice. Ice can also be used when recovering from surgery. Ice is effective, has very few side effects, and is safe for most people to use. PRECAUTIONS  Ice is not a safe treatment option for people with:  Raynaud phenomenon. This is a condition affecting small blood vessels in the extremities. Exposure to cold may cause your problems to return.  Cold hypersensitivity. There are many forms of cold hypersensitivity, including:  Cold urticaria. Red, itchy hives appear on the skin when the tissues begin to warm after being iced.  Cold erythema. This is a red, itchy rash caused by exposure to cold.  Cold hemoglobinuria. Red blood cells break down when the tissues begin to warm after being iced. The hemoglobin that carry oxygen are passed into the urine because they cannot combine with blood proteins fast enough.  Numbness or altered sensitivity in the area being iced. If you have any of the following conditions, do not use ice until you have discussed cryotherapy with your caregiver:  Heart conditions, such as arrhythmia, angina, or chronic heart disease.  High blood pressure.  Healing wounds or open skin in the area being iced.  Current infections.  Rheumatoid  arthritis.  Poor circulation.  Diabetes. Ice slows the blood flow in the region it is applied. This is beneficial when trying to stop inflamed tissues from spreading irritating chemicals to surrounding tissues. However, if you expose your skin to cold temperatures for too long or without the proper protection, you can damage your skin or nerves. Watch for signs of skin damage due to cold. HOME CARE INSTRUCTIONS Follow these tips to use ice and cold packs safely.  Place a dry or damp towel between the ice and skin. A damp towel will cool the skin more quickly, so you may need to shorten the time that the ice is used.  For a more rapid response, add gentle compression to the ice.  Ice for no more than 10 to 20 minutes at a time. The bonier the area you are icing, the less time it will take to get the benefits of ice.  Check your skin after 5  minutes to make sure there are no signs of a poor response to cold or skin damage.  Rest 20 minutes or more between uses.  Once your skin is numb, you can end your treatment. You can test numbness by very lightly touching your skin. The touch should be so light that you do not see the skin dimple from the pressure of your fingertip. When using ice, most people will feel these normal sensations in this order: cold, burning, aching, and numbness.  Do not use ice on someone who cannot communicate their responses to pain, such as small children or people with dementia. HOW TO MAKE AN ICE PACK Ice packs are the most common way to use ice therapy. Other methods include ice massage, ice baths, and cryosprays. Muscle creams that cause a cold, tingly feeling do not offer the same benefits that ice offers and should not be used as a substitute unless recommended by your caregiver. To make an ice pack, do one of the following:  Place crushed ice or a bag of frozen vegetables in a sealable plastic bag. Squeeze out the excess air. Place this bag inside another plastic  bag. Slide the bag into a pillowcase or place a damp towel between your skin and the bag.  Mix 3 parts water with 1 part rubbing alcohol. Freeze the mixture in a sealable plastic bag. When you remove the mixture from the freezer, it will be slushy. Squeeze out the excess air. Place this bag inside another plastic bag. Slide the bag into a pillowcase or place a damp towel between your skin and the bag. SEEK MEDICAL CARE IF:  You develop white spots on your skin. This may give the skin a blotchy (mottled) appearance.  Your skin turns blue or pale.  Your skin becomes waxy or hard.  Your swelling gets worse. MAKE SURE YOU:   Understand these instructions.  Will watch your condition.  Will get help right away if you are not doing well or get worse. Document Released: 10/14/2010 Document Revised: 07/04/2013 Document Reviewed: 10/14/2010 [facility] Patient Information 2015 [place], [place]. This information is not intended to replace advice given to you by your health care provider. Make sure you discuss any questions you have with your health care provider.

## 2014-01-22 NOTE — ED Provider Notes (Signed)
CSN: [id number]     Arrival date & time 01/22/14  2993 History   First MD Initiated Contact with Patient 01/22/14 1030     Chief Complaint  Patient presents with  . Ankle Pain     (Consider location/radiation/quality/duration/timing/severity/associated sxs/prior Treatment) HPI Comments: Jennifer Rich is a 48 y.o. female with a PMHx of HTN, HLD, hypothyroidism, DM2, and obesity, who presents to the ED with complaints of left ankle pain that began 2 days ago after she twisted it while walking. She reports that the pain is 9/10 intermittent sharp pain along the lateral aspect of her left ankle, nonradiating, improved with rest ice and elevation, unrelieved with Tylenol, and worsened with walking or weightbearing. she endorses associated swelling. She denies any numbness or tingling, no prior injuries to this area, no erythema or warmth, no history of gout, no loss of range of motion or strength. Denies fevers or chills, knee pain, back pain, head injury or loss of consciousness, or any other injuries related to this fall. States that her blood pressure becomes elevated when she takes Motrin therefore she hasn't tried this at home. Just started a new job and walks from her car to the building quite a far distance.  Patient is a 48 y.o. female presenting with ankle pain. The history is provided by the patient. No language interpreter was used.  Ankle Pain Location:  Ankle Time since incident:  2 days Injury: yes   Mechanism of injury: fall   Ankle location:  L ankle Pain details:    Quality:  Sharp   Radiates to:  Does not radiate   Severity:  Severe (9/10)   Onset quality:  Gradual   Duration:  2 days   Timing:  Intermittent   Progression:  Unchanged Chronicity:  New Dislocation: no   Foreign body present:  No foreign bodies Prior injury to area:  No Relieved by:  Elevation, ice and rest Worsened by:  Bearing weight Ineffective treatments:  Acetaminophen Associated symptoms:  swelling   Associated symptoms: no back pain, no decreased ROM, no fever, no muscle weakness, no neck pain, no numbness, no stiffness and no tingling   Risk factors: obesity     Past Medical History  Diagnosis Date  . Hypertension   . Chicken pox as a child  . Hyperlipidemia   . Vitamin D deficiency   . Thyroid disease   . Allergy   . Hyperglycemia   . Preventative health care 12/18/2012    Has seen Dr Carlota Raspberry for GYN in past  . Type II or unspecified type diabetes mellitus without mention of complication, not stated as uncontrolled   . Cervical cancer screening 01/25/2013  . Anxiety and depression 01/30/2013  . Goiter     Mildly enlarged thyroid TSH normal   . Obesity, unspecified 04/26/2013  . Pneumonia 05/02/2013  . 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 None    . Wisdom tooth extraction  48 yrs old   Family History  Problem Relation Age of Onset  . Heart attack Maternal Grandfather 75  . Heart disease Maternal Grandfather   . Diabetes Maternal Grandmother     type 2  . Blindness Maternal Grandmother   . Hypertension Paternal Grandmother   . Stroke Paternal Grandfather   . Hypertension Sister   . Hypertension Sister    History  Substance Use Topics  . Smoking status: Never Smoker   .  Smokeless tobacco: Never Used  . Alcohol Use: No   OB History    No data available     Review of Systems  Constitutional: Negative for fever and chills.  Cardiovascular: Negative for leg swelling.  Musculoskeletal: Positive for joint swelling (L ankle), arthralgias (L ankle) and gait problem (painful). Negative for myalgias, back pain, stiffness and neck pain.  Skin: Negative for color change and wound.  Neurological: Negative for weakness and numbness.   10 Systems reviewed and are negative for acute change except as noted in the HPI.    Allergies  Review of patient's allergies indicates no  known allergies.  Home Medications   Prior to Admission medications   Medication Sig Start Date End Date Taking? Authorizing Provider  acetaminophen (TYLENOL) 500 MG tablet Take 500 mg by mouth every 6 (six) hours as needed.    Historical Provider, MD  amLODipine (NORVASC) 5 MG tablet Take 1 tablet (5 mg total) by mouth daily. 12/06/13   Burtis Junes, NP  azithromycin (ZITHROMAX Z-PAK) 250 MG tablet 2 po day one, then 1 daily x 4 days 11/08/13   Veryl Speak, MD  cyclobenzaprine (FLEXERIL) 10 MG tablet Take 1 tablet (10 mg total) by mouth at bedtime. 10/23/13   Brent General, PA-C  HYDROcodone-acetaminophen (NORCO/VICODIN) 5-325 MG per tablet Take 1 tablet by mouth every 6 (six) hours as needed for moderate pain. 10/23/13   Resa Miner Lawyer, PA-C  ibuprofen (ADVIL,MOTRIN) 800 MG tablet Take 1 tablet (800 mg total) by mouth every 8 (eight) hours as needed. 10/23/13   Resa Miner Lawyer, PA-C  losartan (COZAAR) 100 MG tablet TAKE 1 TABLET (100 MG TOTAL) BY MOUTH DAILY. 01/20/14   Mosie Lukes, MD  nebivolol (BYSTOLIC) 10 MG tablet Take 30 mg by mouth daily. 20 mg (2 tablets) in the am and 10 mg (1 tablet) in pm. 06/28/13   Mosie Lukes, MD  predniSONE (DELTASONE) 10 MG tablet Take 30 mg by mouth. 20 mg (2 tablets) in am and 10 mg (1 tablet) in pm. 11/08/13   Veryl Speak, MD  triamterene-hydrochlorothiazide [facility]) 37.5-25 MG per tablet Take 1 tablet by mouth every morning. 05/23/13   Mosie Lukes, MD  triamterene-hydrochlorothiazide (MAXZIDE-25) 37.5-25 MG per tablet TAKE 1 TABLET BY MOUTH DAILY. 01/20/14   Mosie Lukes, MD   BP 145/78 mmHg  Pulse 83  Temp(Src) 98.1 F (36.7 C) (Oral)  Resp 20  SpO2 98%  LMP 12/26/2013 Physical Exam  Constitutional: She is oriented to person, place, and time. Vital signs are normal. She appears well-developed and well-nourished.  Non-toxic appearance. No distress.  Afebrile, nontoxic, NAD, obese AAF, well appearing, pleasant  HENT:    Head: Normocephalic and atraumatic.  Mouth/Throat: Mucous membranes are normal.  Eyes: Conjunctivae and EOM are normal. Right eye exhibits no discharge. Left eye exhibits no discharge.  Neck: Normal range of motion. Neck supple.  Cardiovascular: Normal rate and intact distal pulses.   Distal pulses intact in all extremities  Pulmonary/Chest: Effort normal. No respiratory distress.  Abdominal: Normal appearance. She exhibits no distension.  Musculoskeletal: Normal range of motion.       Left ankle: She exhibits swelling. She exhibits normal range of motion, no deformity and normal pulse. Tenderness. AITFL tenderness found. Achilles tendon normal.       Feet:  L ankle with FROM intact in all fields, mild swelling over lateral aspect with TTP just below lateral malleolus along AITFL ligaments. No TTP with calf  squeeze or high ankle palpation. Achilles tendon WNL. No erythema or warmth. Strength 5/5 in all extremities, and in all directions of movement. Sensation grossly intact. Distal pulses intact.   Neurological: She is alert and oriented to person, place, and time. She has normal strength. No sensory deficit.  Skin: Skin is warm, dry and intact. No rash noted.  Psychiatric: She has a normal mood and affect.  Nursing note and vitals reviewed.   ED Course  Procedures (including critical care time) Labs Review Labs Reviewed - No data to display  Imaging Review Dg Ankle Complete Left  01/22/2014   CLINICAL DATA:  Fall with twisting injury left ankle. Painful and swollen.  EXAM: LEFT ANKLE COMPLETE - 3+ VIEW  COMPARISON:  10/26/2009  FINDINGS: There is mild soft tissue swelling over the ankle. Ankle mortise is within normal. No definite acute fracture or dislocation. Minimal spurring over the posterior and inferior calcaneus.  IMPRESSION: No acute fracture.   Electronically Signed   By: Marin Olp M.D.   On: 01/22/2014 11:21     EKG Interpretation None      MDM   Final diagnoses:   Ankle sprain, left, initial encounter  Essential hypertension    48y/o female with left ankle pain after twisting it 2 days ago. Extremities neurovascularly intact with soft compartments. Mild swelling along lateral malleolus, no erythema or warmth. X-rays obtained which were negative for any fracture or dislocation, injury consistent with a sprain. She does quite a bit of walking for work, therefore we discussed the option of an Echo versus a Cam Walker. Patient opted for the Cam Walker with crutches since this would be easier to walk in after she didn't need crutches for ambulation, but I discussed with her that it was important that she take this off at night and use the compression-type stabilizer for normal daily activity aside from work in order to avoid atrophy, since she didn't have a fracture. Discussed RICE therapy, and orthopedic follow-up in 1-2 weeks for any ongoing pain. Patient declined narcotic pain medications, and opted for Tylenol and Motrin at home. I explained the diagnosis and have given explicit precautions to return to the ER including for any other new or worsening symptoms. The patient understands and accepts the medical plan as it's been dictated and I have answered their questions. Discharge instructions concerning home care and prescriptions have been given. The patient is STABLE and is discharged to home in good condition.  BP 145/78 mmHg  Pulse 83  Temp(Src) 98.1 F (36.7 C) (Oral)  Resp 20  SpO2 98%  LMP 12/26/2013     Patty Sermons Camprubi-Soms, PA-C 01/22/14 [place] Alvino Chapel, MD 01/22/14 [phone]

## 2014-01-22 NOTE — ED Notes (Signed)
Pt states she was walking into work and accidentally twisted L ankle. Swelling noted upon arrival to ED. Pedal pulses present. Able to wiggle digits, sensation intact.

## 2014-02-16 ENCOUNTER — Ambulatory Visit: Payer: 59 | Admitting: Medical

## 2014-04-19 ENCOUNTER — Telehealth: Payer: Self-pay

## 2014-04-19 NOTE — Telephone Encounter (Signed)
Admission date: 04/10/14---[facility] Discharge date:  04/11/14 Sent for medical records   Spoke with patient who stated that she would call back due to being at work.  Pt stated that she was doing much better though.  No complaints or concerns at this time.  Awaiting call back.    Hospital Follow up appointment scheduled for Friday, April 21, 2014 @ 1:30 pm with Elyn Aquas, PA-C.

## 2014-04-20 ENCOUNTER — Telehealth: Payer: Self-pay | Admitting: *Deleted

## 2014-04-20 NOTE — Telephone Encounter (Signed)
Opened in error

## 2014-04-20 NOTE — Telephone Encounter (Signed)
Discharge summary reviewed and placed in [facility] red folder for review and initial.

## 2014-04-20 NOTE — Telephone Encounter (Signed)
Medical records received via fax from [facility] and forwarded to [facility]. JG//CMA

## 2014-04-21 ENCOUNTER — Encounter: Payer: Self-pay | Admitting: Physician Assistant

## 2014-04-21 ENCOUNTER — Ambulatory Visit (INDEPENDENT_AMBULATORY_CARE_PROVIDER_SITE_OTHER): Payer: 59 | Admitting: Physician Assistant

## 2014-04-21 VITALS — BP 138/94 | HR 71 | Temp 98.1°F | Resp 18 | Ht 67.0 in | Wt 321.5 lb

## 2014-04-21 DIAGNOSIS — I10 Essential (primary) hypertension: Secondary | ICD-10-CM

## 2014-04-21 DIAGNOSIS — E669 Obesity, unspecified: Secondary | ICD-10-CM

## 2014-04-21 DIAGNOSIS — R7303 Prediabetes: Secondary | ICD-10-CM

## 2014-04-21 DIAGNOSIS — R0789 Other chest pain: Secondary | ICD-10-CM

## 2014-04-21 DIAGNOSIS — R7309 Other abnormal glucose: Secondary | ICD-10-CM

## 2014-04-21 MED ORDER — METFORMIN HCL 500 MG PO TABS: 500.0000 mg | ORAL_TABLET | Freq: Every day | ORAL | Status: DC

## 2014-04-21 MED ORDER — TRIAMTERENE-HCTZ 37.5-25 MG PO TABS: 1.0000 | ORAL_TABLET | Freq: Every morning | ORAL | Status: DC

## 2014-04-21 NOTE — Assessment & Plan Note (Signed)
Referral placed to Nutrition therapy.  Diet and TLC discussed with patient.

## 2014-04-21 NOTE — Patient Instructions (Signed)
Please take you BP medications as directed. You will be contacted to schedule appointment with the Nutritionist.  Consider taking the Metformin once daily as directed. Stay active! Follow-up in 3 months for repeat labs and assessment.  Insulin Resistance Blood sugar (glucose) levels are controlled by a hormone called insulin. Insulin is made by your pancreas. When your blood glucose goes up, insulin is released into your blood. Insulin is required for your body to function normally. However, your body can become resistant to your own insulin or to insulin given to treat diabetes. In either case, insulin resistance can lead to serious problems. These problems include:  Type 2 diabetes.  Heart disease.  High blood pressure.  Stroke.  Polycystic ovary syndrome.  Fatty liver. CAUSES  Insulin resistance can develop for many different reasons. It is more likely to happen in people with these conditions or characteristics:  Obesity.  Inactivity.  Pregnancy.  High blood pressure.  Stress.  Steroid use.  Infection or severe illness.  Increased levels of cholesterol and triglycerides. SYMPTOMS  There are no symptoms. You may have symptoms related to the various complications of insulin resistance.  DIAGNOSIS  Several different things can make your caregiver suspect you have insulin resistance. These include:  High blood glucose (hyperglycemia).  Abnormal cholesterol levels.  High uric acid levels.  Changes related to blood pressure.  Changes related to inflammation. Insulin resistance can be determined with blood tests. An elevated insulin level when you have not eaten might suggest resistance. Other more complicated tests are sometimes necessary. TREATMENT  Lifestyle changes are the most important treatment for insulin resistance.   If you are overweight and you have insulin resistance, you can improve your insulin sensitivity by losing weight.  Moderate exercise for  30-40 minutes, 4 days a week, can improve insulin sensitivity. Some medicines can also help improve your insulin sensitivity. Your caregiver can discuss these with you if they are appropriate.  HOME CARE INSTRUCTIONS   Do not smoke.  Keep your weight at a healthy level.  Get exercise.  If you have diabetes, follow your caregiver's directions.  If you have high blood pressure, follow your caregiver's directions.  Only take prescription medicines for pain, fever, or discomfort as directed by your caregiver. SEEK MEDICAL CARE IF:   You are diabetic and you are having problems keeping your blood glucose levels at target range.  You are having episodes of low blood glucose (hypoglycemia).  You feel you might be having side effects from your medicines.  You have symptoms of an illness that is not improving after 3-4 days.  You have a sore or wound that is not healing.  You notice a change in vision or a new problem with your vision. SEEK IMMEDIATE MEDICAL CARE IF:   Your blood glucose goes below 70, especially if you have confusion, lightheadedness, or other symptoms with it.  Your blood glucose is very high (as advised by your caregiver) twice in a row.  You pass out.  You have chest pain or trouble breathing.  You have a sudden, severe headache.  You have sudden weakness in one arm or one leg.  You have sudden difficulty speaking or swallowing.  You develop vomiting or diarrhea that is getting worse or not improving after 1 day. Document Released: 04/08/2005 Document Revised: 08/19/2011 Document Reviewed: 07/29/2012 [facility] Patient Information 2015 [place], [place]. This information is not intended to replace advice given to you by your health care provider. Make sure you discuss any  questions you have with your health care provider.  

## 2014-04-21 NOTE — Progress Notes (Signed)
Patient presents to clinic today for hospital follow-up for Atypical chest pain.  workup including CXR, EKG and troponin unremarkable.  Patient kept for further assessment.  Stress test performed and unremarkable. Patient was discharged with instruction to follow-up with her PCP for further assessment of A1C (6.4) and mildly elevated cholesterol levels.  Since discharge, patient states she has been trying to make significant lifestyle changes.  Has removed all soft drinks from her diet and has eliminated fried foods.  Is avoiding stressful situations. Denies recurrence of chest pain.  Denies palpitations, lightheadedness or shortness of breath. Is taking BP medications as directed but has been out of the Maxzide for 2 days.  BP today at 138/94.  Patient complains of constant blurry vision of her left eye x 2 months.  Denies diplopia, eye pain, drainage.  Denies trauma or injury.  Denies similar symptoms of R eye.  Last Optometrist appointment was 1 year ago.  Past Medical History  Diagnosis Date  . Hypertension   . Chicken pox as a child  . Hyperlipidemia   . Vitamin D deficiency   . Thyroid disease   . Allergy   . Hyperglycemia   . Preventative health care 12/18/2012    Has seen Dr Carlota Raspberry for GYN in past  . Type II or unspecified type diabetes mellitus without mention of complication, not stated as uncontrolled   . Cervical cancer screening 01/25/2013  . Anxiety and depression 01/30/2013  . Goiter     Mildly enlarged thyroid TSH normal   . Obesity, unspecified 04/26/2013  . Pneumonia 05/02/2013  . 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 acetaminophen (TYLENOL) 500 MG tablet Take 500 mg by mouth every 6 (six) hours as needed.    Marland Kitchen amLODipine (NORVASC) 5 MG tablet Take 1 tablet (5 mg total) by mouth daily. 30 tablet 3  . ibuprofen (ADVIL,MOTRIN) 800 MG tablet Take 1  tablet (800 mg total) by mouth every 8 (eight) hours as needed. 30 tablet 0  . losartan (COZAAR) 100 MG tablet TAKE 1 TABLET (100 MG TOTAL) BY MOUTH DAILY. 30 tablet 3  . nebivolol (BYSTOLIC) 10 MG tablet Take 30 mg by mouth daily. 20 mg (2 tablets) in the am and 10 mg (1 tablet) in pm.     No current facility-administered medications on file prior to visit.    No Known Allergies  Family History  Problem Relation Age of Onset  . Heart attack Maternal Grandfather 75  . Heart disease Maternal Grandfather   . Diabetes Maternal Grandmother     type 2  . Blindness Maternal Grandmother   . Hypertension Paternal Grandmother   . Stroke Paternal Grandfather   . Hypertension Sister   . Hypertension Sister     History   Social History  . Marital Status: Married    Spouse Name: N/A  . Number of Children: 0  . Years of Education: N/A   Occupational History  . SALES [employer]    Social History Main Topics  . Smoking status: Never Smoker   . Smokeless tobacco: Never Used  . Alcohol Use: No  . Drug Use: No  . Sexual Activity:    Partners: Male    Patent examiner Protection: None     Comment: lives with husband and [place], [place]   Other Topics Concern  . None   Social History Narrative  No Pork         Review of Systems - See HPI.  All other ROS are negative.  BP 138/94 mmHg  Pulse 71  Temp(Src) 98.1 F (36.7 C) (Oral)  Resp 18  Ht 5\' 7"  (1.702 m)  Wt 321 lb 8 oz (145.831 kg)  BMI 50.34 kg/m2  SpO2 100%  LMP 04/11/2014  Physical Exam  Constitutional: She is oriented to person, place, and time and well-developed, well-nourished, and in no distress.  HENT:  Head: Normocephalic and atraumatic.  Eyes: Conjunctivae and EOM are normal. Pupils are equal, round, and reactive to light. Lids are everted and swept, no foreign bodies found.  Fundoscopic exam:      The left eye shows no arteriolar narrowing, no AV nicking, no hemorrhage and no papilledema. The left eye shows  red reflex.  Cardiovascular: Normal rate, regular rhythm, normal heart sounds and intact distal pulses.   Pulmonary/Chest: Effort normal and breath sounds normal. No respiratory distress. She has no wheezes. She has no rales. She exhibits no tenderness.  Neurological: She is alert and oriented to person, place, and time.  Skin: Skin is warm and dry. No rash noted.  Psychiatric: Affect normal.  Vitals reviewed.  Assessment/Plan: HTN (hypertension) Medications refilled.  Patient encouraged to take as directed.  DASH diet discussed.   Obesity Referral placed to Nutrition therapy.  Diet and TLC discussed with patient.   Atypical chest pain Stress test and workup negative.  Thought to be secondary to stress levels.  Resolved.  BP stable. Prediabetes discussed.  Will refer to Diabetic nutrition.  Will start Metformin 500 mg daily. TLC for mildly elevated cholesterol.  Follow-up in 3 months.

## 2014-04-21 NOTE — Progress Notes (Signed)
Pre visit review using our clinic review tool, if applicable. No additional management support is needed unless otherwise documented below in the visit note/SLS  

## 2014-04-21 NOTE — Assessment & Plan Note (Signed)
Stress test and workup negative.  Thought to be secondary to stress levels.  Resolved.  BP stable. Prediabetes discussed.  Will refer to Diabetic nutrition.  Will start Metformin 500 mg daily. TLC for mildly elevated cholesterol.  Follow-up in 3 months.

## 2014-04-21 NOTE — Assessment & Plan Note (Signed)
Medications refilled.  Patient encouraged to take as directed.  DASH diet discussed.

## 2014-05-05 ENCOUNTER — Encounter: Payer: Self-pay | Admitting: Family Medicine

## 2014-05-05 ENCOUNTER — Ambulatory Visit (INDEPENDENT_AMBULATORY_CARE_PROVIDER_SITE_OTHER): Payer: 59 | Admitting: Family Medicine

## 2014-05-05 VITALS — BP 122/82 | HR 95 | Temp 98.4°F | Ht 67.0 in | Wt 321.2 lb

## 2014-05-05 DIAGNOSIS — I10 Essential (primary) hypertension: Secondary | ICD-10-CM

## 2014-05-05 DIAGNOSIS — E782 Mixed hyperlipidemia: Secondary | ICD-10-CM

## 2014-05-05 DIAGNOSIS — E669 Obesity, unspecified: Secondary | ICD-10-CM

## 2014-05-05 DIAGNOSIS — E785 Hyperlipidemia, unspecified: Secondary | ICD-10-CM

## 2014-05-05 DIAGNOSIS — R739 Hyperglycemia, unspecified: Secondary | ICD-10-CM

## 2014-05-05 DIAGNOSIS — F411 Generalized anxiety disorder: Secondary | ICD-10-CM

## 2014-05-05 LAB — COMPREHENSIVE METABOLIC PANEL
ALK PHOS: 72 U/L (ref 39–117)
ALT: 7 U/L (ref 0–35)
AST: 14 U/L (ref 0–37)
Albumin: 3.8 g/dL (ref 3.5–5.2)
BUN: 11 mg/dL (ref 6–23)
CO2: 29 mEq/L (ref 19–32)
Calcium: 8.9 mg/dL (ref 8.4–10.5)
Chloride: 105 mEq/L (ref 96–112)
Creatinine, Ser: 0.95 mg/dL (ref 0.40–1.20)
GFR: 80.69 mL/min (ref >60.00)
GLUCOSE: 102 mg/dL — AB (ref 70–99)
POTASSIUM: 4.1 meq/L (ref 3.5–5.1)
Sodium: 136 mEq/L (ref 135–145)
Total Bilirubin: 0.7 mg/dL (ref 0.2–1.2)
Total Protein: 7.5 g/dL (ref 6.0–8.3)

## 2014-05-05 LAB — CBC
HEMATOCRIT: 38.3 % (ref 36.0–46.0)
Hemoglobin: 12.5 g/dL (ref 12.0–15.0)
MCHC: 32.7 g/dL (ref 30.0–36.0)
MCV: 75.7 fl — ABNORMAL LOW (ref 78.0–100.0)
Platelets: 223 10*3/uL (ref 150.0–400.0)
RBC: 5.06 Mil/uL (ref 3.87–5.11)
RDW: 15.5 % (ref 11.5–15.5)
WBC: 5.8 10*3/uL (ref 4.0–10.5)

## 2014-05-05 LAB — LIPID PANEL
CHOL/HDL RATIO: 4
Cholesterol: 176 mg/dL (ref 0–200)
HDL: 44.9 mg/dL (ref >39.00)
LDL Cholesterol: 115 mg/dL — ABNORMAL HIGH (ref 0–99)
NONHDL: 131.1
Triglycerides: 81 mg/dL (ref 0.0–149.0)
VLDL: 16.2 mg/dL (ref 0.0–40.0)

## 2014-05-05 LAB — TSH: TSH: 2.04 u[IU]/mL (ref 0.35–4.50)

## 2014-05-05 LAB — HEMOGLOBIN A1C: Hgb A1c MFr Bld: 6.4 % (ref 4.6–6.5)

## 2014-05-05 MED ORDER — FLUTICASONE PROPIONATE 50 MCG/ACT NA SUSP: 2.0000 | Freq: Every day | NASAL | Status: DC | PRN

## 2014-05-05 MED ORDER — ALPRAZOLAM 0.25 MG PO TABS: 0.2500 mg | ORAL_TABLET | Freq: Two times a day (BID) | ORAL | Status: DC | PRN

## 2014-05-05 MED ORDER — AMLODIPINE BESYLATE 5 MG PO TABS: 5.0000 mg | ORAL_TABLET | Freq: Every day | ORAL | Status: DC

## 2014-05-05 MED ORDER — CITALOPRAM HYDROBROMIDE 10 MG PO TABS
10.0000 mg | ORAL_TABLET | Freq: Every day | ORAL | Status: DC
Start: 2014-05-05 — End: 2014-07-19

## 2014-05-05 NOTE — Progress Notes (Signed)
Pre visit review using our clinic review tool, if applicable. No additional management support is needed unless otherwise documented below in the visit note. 

## 2014-05-05 NOTE — Patient Instructions (Addendum)
Zyrtec/Cetirizine 10 mg once to twice daily  Probiotic, humidifier, 64 oz of clear fluids daily   Allergies Allergies may happen from anything your body is sensitive to. This may be food, medicines, pollens, chemicals, and nearly anything around you in everyday life that produces allergens. An allergen is anything that causes an allergy producing substance. Heredity is often a factor in causing these problems. This means you may have some of the same allergies as your parents. Food allergies happen in all age groups. Food allergies are some of the most severe and life threatening. Some common food allergies are cow's milk, seafood, eggs, nuts, wheat, and soybeans. SYMPTOMS   Swelling around the mouth.  An itchy red rash or hives.  Vomiting or diarrhea.  Difficulty breathing. SEVERE ALLERGIC REACTIONS ARE LIFE-THREATENING. This reaction is called anaphylaxis. It can cause the mouth and throat to swell and cause difficulty with breathing and swallowing. In severe reactions only a trace amount of food (for example, peanut oil in a salad) may cause death within seconds. Seasonal allergies occur in all age groups. These are seasonal because they usually occur during the same season every year. They may be a reaction to molds, grass pollens, or tree pollens. Other causes of problems are house dust mite allergens, pet dander, and mold spores. The symptoms often consist of nasal congestion, a runny itchy nose associated with sneezing, and tearing itchy eyes. There is often an associated itching of the mouth and ears. The problems happen when you come in contact with pollens and other allergens. Allergens are the particles in the air that the body reacts to with an allergic reaction. This causes you to release allergic antibodies. Through a chain of events, these eventually cause you to release histamine into the blood stream. Although it is meant to be protective to the body, it is this release that  causes your discomfort. This is why you were given anti-histamines to feel better. If you are unable to pinpoint the offending allergen, it may be determined by skin or blood testing. Allergies cannot be cured but can be controlled with medicine. Hay fever is a collection of all or some of the seasonal allergy problems. It may often be treated with simple over-the-counter medicine such as diphenhydramine. Take medicine as directed. Do not drink alcohol or drive while taking this medicine. Check with your caregiver or package insert for child dosages. If these medicines are not effective, there are many new medicines your caregiver can prescribe. Stronger medicine such as nasal spray, eye drops, and corticosteroids may be used if the first things you try do not work well. Other treatments such as immunotherapy or desensitizing injections can be used if all else fails. Follow up with your caregiver if problems continue. These seasonal allergies are usually not life threatening. They are generally more of a nuisance that can often be handled using medicine. HOME CARE INSTRUCTIONS   If unsure what causes a reaction, keep a diary of foods eaten and symptoms that follow. Avoid foods that cause reactions.  If hives or rash are present:  Take medicine as directed.  You may use an over-the-counter antihistamine (diphenhydramine) for hives and itching as needed.  Apply cold compresses (cloths) to the skin or take baths in cool water. Avoid hot baths or showers. Heat will make a rash and itching worse.  If you are severely allergic:  Following a treatment for a severe reaction, hospitalization is often required for closer follow-up.  Wear a medic-alert bracelet  or necklace stating the allergy.  You and your family must learn how to give adrenaline or use an anaphylaxis kit.  If you have had a severe reaction, always carry your anaphylaxis kit or EpiPen with you. Use this medicine as directed by your  caregiver if a severe reaction is occurring. Failure to do so could have a fatal outcome. SEEK MEDICAL CARE IF:  You suspect a food allergy. Symptoms generally happen within 30 minutes of eating a food.  Your symptoms have not gone away within 2 days or are getting worse.  You develop new symptoms.  You want to retest yourself or your child with a food or drink you think causes an allergic reaction. Never do this if an anaphylactic reaction to that food or drink has happened before. Only do this under the care of a caregiver. SEEK IMMEDIATE MEDICAL CARE IF:   You have difficulty breathing, are wheezing, or have a tight feeling in your chest or throat.  You have a swollen mouth, or you have hives, swelling, or itching all over your body.  You have had a severe reaction that has responded to your anaphylaxis kit or an EpiPen. These reactions may return when the medicine has worn off. These reactions should be considered life threatening. MAKE SURE YOU:   Understand these instructions.  Will watch your condition.  Will get help right away if you are not doing well or get worse. Document Released: 05/13/2002 Document Revised: 06/14/2012 Document Reviewed: 10/18/2007 [facility] Patient Information 2015 [place], [place]. This information is not intended to replace advice given to you by your health care provider. Make sure you discuss any questions you have with your health care provider.

## 2014-05-07 ENCOUNTER — Encounter: Payer: Self-pay | Admitting: Family Medicine

## 2014-05-07 NOTE — Assessment & Plan Note (Signed)
Encouraged DASH diet, decrease po intake and increase exercise as tolerated. Needs 7-8 hours of sleep nightly. Avoid trans fats, eat small, frequent meals every 4-5 hours with lean proteins, complex carbs and healthy fats. Minimize simple carbs, GMO foods. 

## 2014-05-07 NOTE — Assessment & Plan Note (Addendum)
hgba1c acceptable, minimize simple carbs. Increase exercise as tolerated.  

## 2014-05-07 NOTE — Progress Notes (Signed)
Jennifer Rich  [id number] 08-25-1965 05/07/2014      Progress Note-Follow Up  Subjective  Chief Complaint  Chief Complaint  Patient presents with  . Allergies  . Anxiety    HPI  Patient is a 49 y.o. female in today for routine medical care. Patient in today for follow up. Doing fairly well. Has been working hard and struggling with allergies and congestion. Zyrtec daily is only slightly helpfu. No f/c. struggles with stressors with work, home etc. Denies CP/palp/SOB/HA/fevers/GI or GU c/o. Taking meds as prescribed  Past Medical History  Diagnosis Date  . Hypertension   . Chicken pox as a child  . Hyperlipidemia   . Vitamin D deficiency   . Thyroid disease   . Allergy   . Hyperglycemia   . Preventative health care 12/18/2012    Has seen Dr Carlota Raspberry for GYN in past  . Type II or unspecified type diabetes mellitus without mention of complication, not stated as uncontrolled   . Cervical cancer screening 01/25/2013  . Anxiety and depression 01/30/2013  . Goiter     Mildly enlarged thyroid TSH normal   . Obesity, unspecified 04/26/2013  . Pneumonia 05/02/2013  . 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 None    . Wisdom tooth extraction  49 yrs old    Family History  Problem Relation Age of Onset  . Heart attack Maternal Grandfather 75  . Heart disease Maternal Grandfather   . Diabetes Maternal Grandmother     type 2  . Blindness Maternal Grandmother   . Hypertension Paternal Grandmother   . Stroke Paternal Grandfather   . Hypertension Sister   . Hypertension Sister     History   Social History  . Marital Status: Married    Spouse Name: N/A  . Number of Children: 0  . Years of Education: N/A   Occupational History  . SALES [employer]    Social History Main Topics  . Smoking status: Never Smoker   . Smokeless tobacco: Never Used  . Alcohol Use: No  . Drug Use: No  .  Sexual Activity:    Partners: Male    Patent examiner Protection: None     Comment: lives with husband and [place], [place]   Other Topics Concern  . Not on file   Social History Narrative   No Pork          Current Outpatient Prescriptions on File Prior to Visit  Medication Sig Dispense Refill  . acetaminophen (TYLENOL) 500 MG tablet Take 500 mg by mouth every 6 (six) hours as needed.    Marland Kitchen ibuprofen (ADVIL,MOTRIN) 800 MG tablet Take 1 tablet (800 mg total) by mouth every 8 (eight) hours as needed. 30 tablet 0  . losartan (COZAAR) 100 MG tablet TAKE 1 TABLET (100 MG TOTAL) BY MOUTH DAILY. 30 tablet 3  . metFORMIN (GLUCOPHAGE) 500 MG tablet Take 1 tablet (500 mg total) by mouth daily with breakfast. 30 tablet 3  . nebivolol (BYSTOLIC) 10 MG tablet Take 30 mg by mouth daily. 20 mg (2 tablets) in the am and 10 mg (1 tablet) in pm.    . triamterene-hydrochlorothiazide (MAXZIDE-25) 37.5-25 MG per tablet Take 1 tablet by mouth every morning. 30 tablet 3   No current facility-administered medications on file prior to visit.    No Known Allergies  Review of Systems  Review of Systems  Constitutional: Negative for fever and malaise/fatigue.  HENT: Positive for congestion.   Eyes: Negative for discharge.  Respiratory: Negative for shortness of breath.   Cardiovascular: Negative for chest pain, palpitations and leg swelling.  Gastrointestinal: Negative for nausea, abdominal pain and diarrhea.  Genitourinary: Negative for dysuria.  Musculoskeletal: Negative for falls.  Skin: Negative for rash.  Neurological: Negative for loss of consciousness and headaches.  Endo/Heme/Allergies: Negative for polydipsia.  Psychiatric/Behavioral: Negative for depression and suicidal ideas. The patient is not nervous/anxious and does not have insomnia.     Objective  BP 122/82 mmHg  Pulse 95  Temp(Src) 98.4 F (36.9 C) (Oral)  Ht 5\' 7"  (1.702 m)  Wt 321 lb 4 oz (145.718 kg)  BMI 50.30 kg/m2   SpO2 95%  LMP 04/11/2014  Physical Exam  Physical Exam  Constitutional: She is oriented to person, place, and time and well-developed, well-nourished, and in no distress. No distress.  HENT:  Head: Normocephalic and atraumatic.  Eyes: Conjunctivae are normal.  Neck: Neck supple. No thyromegaly present.  Cardiovascular: Normal rate, regular rhythm and normal heart sounds.   No murmur heard. Pulmonary/Chest: Effort normal and breath sounds normal. She has no wheezes.  Abdominal: She exhibits no distension and no mass.  Musculoskeletal: She exhibits no edema.  Lymphadenopathy:    She has no cervical adenopathy.  Neurological: She is alert and oriented to person, place, and time.  Skin: Skin is warm and dry. No rash noted. She is not diaphoretic.  Psychiatric: Memory, affect and judgment normal.    Lab Results  Component Value Date   TSH 2.04 05/05/2014   Lab Results  Component Value Date   WBC 5.8 05/05/2014   HGB 12.5 05/05/2014   HCT 38.3 05/05/2014   MCV 75.7* 05/05/2014   PLT 223.0 05/05/2014   Lab Results  Component Value Date   CREATININE 0.95 05/05/2014   BUN 11 05/05/2014   NA 136 05/05/2014   K 4.1 05/05/2014   CL 105 05/05/2014   CO2 29 05/05/2014   Lab Results  Component Value Date   ALT 7 05/05/2014   AST 14 05/05/2014   ALKPHOS 72 05/05/2014   BILITOT 0.7 05/05/2014   Lab Results  Component Value Date   CHOL 176 05/05/2014   Lab Results  Component Value Date   HDL 44.90 05/05/2014   Lab Results  Component Value Date   LDLCALC 115* 05/05/2014   Lab Results  Component Value Date   TRIG 81.0 05/05/2014   Lab Results  Component Value Date   CHOLHDL 4 05/05/2014     Assessment & Plan  Essential hypertension, benign Well controlled, no changes to meds. Encouraged heart healthy diet such as the DASH diet and exercise as tolerated.    Hyperglycemia hgba1c acceptable, minimize simple carbs. Increase exercise as tolerated.     Hyperlipidemia Encouraged heart healthy diet, increase exercise, avoid trans fats, consider a krill oil cap daily   Obesity Encouraged DASH diet, decrease po intake and increase exercise as tolerated. Needs 7-8 hours of sleep nightly. Avoid trans fats, eat small, frequent meals every 4-5 hours with lean proteins, complex carbs and healthy fats. Minimize simple carbs, GMO foods.

## 2014-05-07 NOTE — Assessment & Plan Note (Signed)
Encouraged heart healthy diet, increase exercise, avoid trans fats, consider a krill oil cap daily 

## 2014-05-07 NOTE — Assessment & Plan Note (Signed)
>>  ASSESSMENT AND PLAN FOR HYPERGLYCEMIA WRITTEN ON 05/07/2014  8:22 PM BY BLYTH, STACEY A, MD  hgba1c acceptable, minimize simple carbs. Increase exercise as tolerated.

## 2014-05-07 NOTE — Assessment & Plan Note (Signed)
Well controlled, no changes to meds. Encouraged heart healthy diet such as the DASH diet and exercise as tolerated.  °

## 2014-05-19 ENCOUNTER — Telehealth: Payer: Self-pay | Admitting: Family Medicine

## 2014-05-19 ENCOUNTER — Encounter: Payer: 59 | Admitting: Dietician

## 2014-05-19 NOTE — Telephone Encounter (Signed)
Called patient to follow-up.  Patient not available, left message for patient to return call.

## 2014-05-19 NOTE — Telephone Encounter (Signed)
[facility] - Client TELEPHONE ADVICE RECORD [facility]  Patient Name: Jennifer Rich  Gender: Female  DOB: 08/07/1965   Age: 49 Y 2 M 8 D  Return Phone Number: [phone] (Primary)  Address: [address]   City/State/Zip: [place] [address]   Client [facility] - Client  Client Site [facility] - Day  Physician Blyth, [place] Type Call  Call Type Triage / Clinical     Relationship To Patient Self  Appointment Disposition EMR Appointment Attempted - Not Scheduled  Info pasted into Epic Yes  Return Phone Number [phone] (Primary)  Chief Complaint Dizziness  Initial Comment caller states her BP is 170/110 and she is dizzy     PreDisposition Go to ED      Nurse Assessment  Nurse: Shawn Stall, RN, Trish Date/Time (Eastern Time): 05/19/2014 12:58:05 PM  Confirm and document reason for call. If symptomatic, describe symptoms. ---Patient is calling for self and caller states her BP is 170/110 and she is dizzy she has had something to eat. is on lastartin ostolic triamterentz  Has the patient traveled out of the country within the last 30 days? ---No  Does the patient require triage? ---Yes  Related visit to physician within the last 2 weeks? ---Yes  Does the PT have any chronic conditions? (i.e. diabetes, asthma, etc.) ---Yes  List chronic conditions. ---metesorin is new medication started 2 weeks ago. pre diabetic HTN,  Did the patient indicate they were pregnant? ---No     Guidelines      Guideline Title Affirmed Question Affirmed Notes Nurse Date/Time (Eastern Time)  High Blood Pressure [1] BP ? 160 / 100 AND [2] cardiac or neurologic symptoms (e.g., chest pain, difficulty breathing, unsteady gait, blurred vision)  Shawn Stall, RN, Trish 05/19/2014 1:00:02 PM   Disp. Time Eilene Ghazi Time) Disposition Final User   05/19/2014 12:47:48 PM Send To Clinical Follow Up Queue   [place]    05/19/2014 1:03:00 PM Go to ED Now Yes Shawn Stall, RN, Trish        Caller Understands: Yes  Disagree/Comply: Comply     Care Advice Given Per Guideline      GO TO ED NOW: You need to be seen in the Emergency Department. Go to the ER at ___________ [place] now. Drive carefully. NOTE TO TRIAGER - DRIVING: * Another adult should drive. CARE ADVICE given per High Blood Pressure (Adult) guideline. CALL EMS 911 IF: * Patient passes out, starts acting confused or becomes too weak to stand.   After Care Instructions Given     Call Event Type User Date / Time Description        Referrals  [facility] - ED

## 2014-05-23 NOTE — Telephone Encounter (Signed)
Spoke to patient regarding BP issues and dizziness, but patient states these issues have resolved.  She is now complaining of sore throat and cough.  Appointment made per patient request with Mackie Pai, PA on 05/24/14.

## 2014-05-24 ENCOUNTER — Ambulatory Visit: Payer: 59 | Admitting: Medical

## 2014-05-24 ENCOUNTER — Encounter: Payer: Self-pay | Admitting: Medical

## 2014-05-24 ENCOUNTER — Ambulatory Visit (INDEPENDENT_AMBULATORY_CARE_PROVIDER_SITE_OTHER): Payer: 59 | Admitting: Medical

## 2014-05-24 VITALS — BP 151/96 | HR 85 | Temp 98.6°F | Ht 67.0 in | Wt 282.4 lb

## 2014-05-24 DIAGNOSIS — J029 Acute pharyngitis, unspecified: Secondary | ICD-10-CM

## 2014-05-24 MED ORDER — CEPHALEXIN 500 MG PO CAPS: 500.0000 mg | ORAL_CAPSULE | Freq: Two times a day (BID) | ORAL | Status: DC

## 2014-05-24 MED ORDER — BENZONATATE 100 MG PO CAPS: 100.0000 mg | ORAL_CAPSULE | Freq: Three times a day (TID) | ORAL | Status: DC | PRN

## 2014-05-24 NOTE — Progress Notes (Signed)
Pre visit review using our clinic review tool, if applicable. No additional management support is needed unless otherwise documented below in the visit note. 

## 2014-05-24 NOTE — Patient Instructions (Addendum)
Acute pharyngitis Your strep test was negative. However, your physical exam and clinical presentation is suspicious for strep and it is important to note that rapid strep test can be falsely negative. So I am going to give you cephalexin  antibiotic today based on your exam and clinical presentation.  Rest hydrate, tylenol for fever, and warm salt water gargles.   Follow up in 7 days or as needed.

## 2014-05-24 NOTE — Assessment & Plan Note (Addendum)
Your strep test was negative. However, your physical exam and clinical presentation is suspicious for strep and it is important to note that rapid strep test can be falsely negative. So I am going to give you cephalexin  antibiotic today based on your exam and clinical presentation.  Rest hydrate, tylenol for fever, and warm salt water gargles.   Follow up in 7 days or as needed.

## 2014-05-24 NOTE — Progress Notes (Signed)
Subjective:    Patient ID: Jennifer Rich, female    DOB: Jan 01, 1966, 49 y.o.   MRN: [id number]  HPI Pt has sore throat for  2 day. Associated symptom.  Body aches-mild Fever- yes Chills-yes HA-yes Neck symptoms-sub m node tender. Lymph node enlargement- Rash-no Painful swallowing-swallowing own spit hurts. Recent strep contact-grand kid strep. Kissed him this weekend   Some cough today.   Review of Systems  Constitutional: Positive for fever and chills. Negative for fatigue.  HENT: Positive for sore throat.   Respiratory: Positive for cough.   Cardiovascular: Negative for chest pain and palpitations.  Gastrointestinal: Negative for abdominal pain.  Musculoskeletal:       Faint myalgia.  Skin: Positive for rash.  Neurological: Negative for facial asymmetry, speech difficulty, weakness and headaches.  Hematological: Negative for adenopathy. Does not bruise/bleed easily.   Past Medical History  Diagnosis Date  . Hypertension   . Chicken pox as a child  . Hyperlipidemia   . Vitamin D deficiency   . Thyroid disease   . Allergy   . Hyperglycemia   . Preventative health care 12/18/2012    Has seen Dr Carlota Raspberry for GYN in past  . Type II or unspecified type diabetes mellitus without mention of complication, not stated as uncontrolled   . Cervical cancer screening 01/25/2013  . Anxiety and depression 01/30/2013  . Goiter     Mildly enlarged thyroid TSH normal   . Obesity, unspecified 04/26/2013  . Pneumonia 05/02/2013  . 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 Marital Status: Married    Spouse Name: N/A  . Number of Children: 0  . Years of Education: N/A   Occupational History  . SALES [employer]    Social History Main Topics  . Smoking status: Never Smoker   . Smokeless tobacco: Never Used  . Alcohol Use: No  . Drug Use: No  . Sexual Activity:    Partners: Male    Patent examiner Protection: None      Comment: lives with husband and [place], [place]   Other Topics Concern  . Not on file   Social History Narrative   No Pork          Past Surgical History  Procedure Laterality Date  . None    . Wisdom tooth extraction  49 yrs old    Family History  Problem Relation Age of Onset  . Heart attack Maternal Grandfather 75  . Heart disease Maternal Grandfather   . Diabetes Maternal Grandmother     type 2  . Blindness Maternal Grandmother   . Hypertension Paternal Grandmother   . Stroke Paternal Grandfather   . Hypertension Sister   . Hypertension Sister     No Known Allergies  Current Outpatient Prescriptions on File Prior to Visit  Medication Sig Dispense Refill  . acetaminophen (TYLENOL) 500 MG tablet Take 500 mg by mouth every 6 (six) hours as needed.    . ALPRAZolam (XANAX) 0.25 MG tablet Take 1 tablet (0.25 mg total) by mouth 2 (two) times daily as needed for anxiety. 20 tablet 1  . amLODipine (NORVASC) 5 MG tablet Take 1 tablet (5 mg total) by mouth daily. 30 tablet 6  . citalopram (CELEXA) 10 MG tablet Take 1 tablet (10 mg total) by mouth daily. 30 tablet 3  . losartan (COZAAR) 100 MG tablet TAKE 1 TABLET (100 MG TOTAL) BY MOUTH DAILY.  30 tablet 3  . metFORMIN (GLUCOPHAGE) 500 MG tablet Take 1 tablet (500 mg total) by mouth daily with breakfast. 30 tablet 3  . nebivolol (BYSTOLIC) 10 MG tablet Take 30 mg by mouth daily. 20 mg (2 tablets) in the am and 10 mg (1 tablet) in pm.    . triamterene-hydrochlorothiazide (MAXZIDE-25) 37.5-25 MG per tablet Take 1 tablet by mouth every morning. 30 tablet 3   No current facility-administered medications on file prior to visit.    BP 151/96 mmHg  Pulse 85  Temp(Src) 98.6 F (37 C) (Oral)  Ht 5\' 7"  (1.702 m)  Wt 282 lb 6.4 oz (128.096 kg)  BMI 44.22 kg/m2  SpO2 96%  LMP 05/11/2014       Objective:   Physical Exam   General- No acute distress, pleasant pt.  Neck- from, No nuccal rigidity, Mild submandibular node  hypertrophy.  Lungs- Clear even and unlabored.  Heart- Regular, rate and rhythm. HEENT- Head- normocephalic Eyes- PEERL bilaterally. Ears- Canals clear, normal tm's bilaterally. Nose- No frontal or maxillary sinus tenderness to palpation. Turbinates normal. Throat- posterior pharynx shows  2+ tonsillar hypertrophy plus,   Moderate bright erythma,  No  discharge.   Neurologic- CN III- XII grossly intact.       Assessment & Plan:

## 2014-07-17 ENCOUNTER — Telehealth: Payer: Self-pay | Admitting: *Deleted

## 2014-07-17 NOTE — Telephone Encounter (Signed)
Pt dropped off DMV forms to be completed. Forwarded to Dr. Charlett Blake. She is out of the office until Thursday of this week. JG//CMA

## 2014-07-19 ENCOUNTER — Telehealth: Payer: Self-pay

## 2014-07-19 ENCOUNTER — Encounter: Payer: Self-pay | Admitting: Physician Assistant

## 2014-07-19 ENCOUNTER — Ambulatory Visit (INDEPENDENT_AMBULATORY_CARE_PROVIDER_SITE_OTHER): Payer: 59 | Admitting: Physician Assistant

## 2014-07-19 VITALS — BP 130/98 | HR 65 | Temp 98.4°F | Ht 67.0 in | Wt 318.8 lb

## 2014-07-19 DIAGNOSIS — F411 Generalized anxiety disorder: Secondary | ICD-10-CM | POA: Diagnosis not present

## 2014-07-19 MED ORDER — CITALOPRAM HYDROBROMIDE 10 MG PO TABS: 10.0000 mg | ORAL_TABLET | Freq: Every day | ORAL | Status: DC

## 2014-07-19 MED ORDER — ALPRAZOLAM 0.25 MG PO TABS
0.2500 mg | ORAL_TABLET | Freq: Two times a day (BID) | ORAL | Status: DC | PRN
Start: 2014-07-19 — End: 2015-06-07

## 2014-07-19 NOTE — Progress Notes (Signed)
Pre visit review using our clinic review tool, if applicable. No additional management support is needed unless otherwise documented below in the visit note. 

## 2014-07-19 NOTE — Telephone Encounter (Signed)
Patient called in to schedule an appt for possibly elevated BP and dizziness. Does not have a home monitoring device. Started on Monday due to an extreme family stressor. Has been very upset and tearful. Has not had any other symptoms such as SOB, fainting, chest pains. Patient is scheduled to be evaluated today.

## 2014-07-19 NOTE — Assessment & Plan Note (Signed)
Increased anxiety due to significant stressors.  Discussed treatment options -- patient does not wish to increase Citalopram or try new medications.  Discussed proper use of Xanax for this.  Refill given.  Encourage counseling.  Handout on [employer] given so patient can set up appointments.

## 2014-07-19 NOTE — Progress Notes (Signed)
Patient presents to clinic today c/o increased stressors at home that is culminating in increased anxiety.  Patient has just started taking care of grandchildren as mother has been placed in rehab for heroin use. Patient endorses episodes of lightheadedness starting yesterday.  Denies chest pain, palpitations or shortness of breath.  Has been more tearful recently.  Would like to see a Social worker.  Past Medical History  Diagnosis Date  . Hypertension   . Chicken pox as a child  . Hyperlipidemia   . Vitamin D deficiency   . Thyroid disease   . Allergy   . Hyperglycemia   . Preventative health care 12/18/2012    Has seen Dr Carlota Raspberry for GYN in past  . Type II or unspecified type diabetes mellitus without mention of complication, not stated as uncontrolled   . Cervical cancer screening 01/25/2013  . Anxiety and depression 01/30/2013  . Goiter     Mildly enlarged thyroid TSH normal   . Obesity, unspecified 04/26/2013  . Pneumonia 05/02/2013  . 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 amLODipine (NORVASC) 5 MG tablet Take 1 tablet (5 mg total) by mouth daily. 30 tablet 6  . losartan (COZAAR) 100 MG tablet TAKE 1 TABLET (100 MG TOTAL) BY MOUTH DAILY. 30 tablet 3  . metFORMIN (GLUCOPHAGE) 500 MG tablet Take 1 tablet (500 mg total) by mouth daily with breakfast. 30 tablet 3  . nebivolol (BYSTOLIC) 10 MG tablet Take 30 mg by mouth daily. 20 mg (2 tablets) in the am and 10 mg (1 tablet) in pm.    . triamterene-hydrochlorothiazide (MAXZIDE-25) 37.5-25 MG per tablet Take 1 tablet by mouth every morning. 30 tablet 3  . acetaminophen (TYLENOL) 500 MG tablet Take 500 mg by mouth every 6 (six) hours as needed.     No current facility-administered medications on file prior to visit.    No Known Allergies  Family History  Problem Relation Age of Onset  . Heart attack Maternal  Grandfather 75  . Heart disease Maternal Grandfather   . Diabetes Maternal Grandmother     type 2  . Blindness Maternal Grandmother   . Hypertension Paternal Grandmother   . Stroke Paternal Grandfather   . Hypertension Sister   . Hypertension Sister     History   Social History  . Marital Status: Married    Spouse Name: N/A  . Number of Children: 0  . Years of Education: N/A   Occupational History  . SALES [employer]    Social History Main Topics  . Smoking status: Never Smoker   . Smokeless tobacco: Never Used  . Alcohol Use: No  . Drug Use: No  . Sexual Activity:    Partners: Male    Patent examiner Protection: None     Comment: lives with husband and [place], [place]   Other Topics Concern  . None   Social History Narrative   No Pork         Review of Systems - See HPI.  All other ROS are negative.  BP 130/98 mmHg  Pulse 65  Temp(Src) 98.4 F (36.9 C) (Oral)  Ht 5\' 7"  (1.702 m)  Wt 318 lb 12.8 oz (144.607 kg)  BMI 49.92 kg/m2  SpO2 100%  LMP 06/18/2014  Physical Exam  Constitutional: She is oriented to person, place, and time and well-developed, well-nourished, and in no  distress.  HENT:  Head: Normocephalic and atraumatic.  Cardiovascular: Normal rate, regular rhythm, normal heart sounds and intact distal pulses.   Pulmonary/Chest: Effort normal and breath sounds normal. No respiratory distress. She has no wheezes. She has no rales. She exhibits no tenderness.  Neurological: She is alert and oriented to person, place, and time.  Skin: Skin is warm and dry. No rash noted.  Psychiatric: Affect normal.  Vitals reviewed.   Recent Results (from the past 2160 hour(s))  CBC     Status: Abnormal   Collection Time: 05/05/14 11:00 AM  Result Value Ref Range   WBC 5.8 4.0 - 10.5 K/uL   RBC 5.06 3.87 - 5.11 Mil/uL   Platelets 223.0 150.0 - 400.0 K/uL   Hemoglobin 12.5 12.0 - 15.0 g/dL   HCT 38.3 36.0 - 46.0 %   MCV 75.7 (L) 78.0 - 100.0 fl   MCHC 32.7  30.0 - 36.0 g/dL   RDW 15.5 11.5 - 15.5 %  TSH     Status: None   Collection Time: 05/05/14 11:00 AM  Result Value Ref Range   TSH 2.04 0.35 - 4.50 uIU/mL  Hemoglobin A1c     Status: None   Collection Time: 05/05/14 11:00 AM  Result Value Ref Range   Hgb A1c MFr Bld 6.4 4.6 - 6.5 %    Comment: Glycemic Control Guidelines for People with Diabetes:Non Diabetic:  <6%Goal of Therapy: <7%Additional Action Suggested:  >8%   Comprehensive metabolic panel     Status: Abnormal   Collection Time: 05/05/14 11:00 AM  Result Value Ref Range   Sodium 136 135 - 145 mEq/L   Potassium 4.1 3.5 - 5.1 mEq/L   Chloride 105 96 - 112 mEq/L   CO2 29 19 - 32 mEq/L   Glucose, Bld 102 (H) 70 - 99 mg/dL   BUN 11 6 - 23 mg/dL   Creatinine, Ser 0.95 0.40 - 1.20 mg/dL   Total Bilirubin 0.7 0.2 - 1.2 mg/dL   Alkaline Phosphatase 72 39 - 117 U/L   AST 14 0 - 37 U/L   ALT 7 0 - 35 U/L   Total Protein 7.5 6.0 - 8.3 g/dL   Albumin 3.8 3.5 - 5.2 g/dL   Calcium 8.9 8.4 - 10.5 mg/dL   GFR 80.69 >60.00 mL/min  Lipid panel     Status: Abnormal   Collection Time: 05/05/14 11:00 AM  Result Value Ref Range   Cholesterol 176 0 - 200 mg/dL    Comment: ATP III Classification       Desirable:  < 200 mg/dL               Borderline High:  200 - 239 mg/dL          High:  > = 240 mg/dL   Triglycerides 81.0 0.0 - 149.0 mg/dL    Comment: Normal:  <150 mg/dLBorderline High:  150 - 199 mg/dL   HDL 44.90 >39.00 mg/dL   VLDL 16.2 0.0 - 40.0 mg/dL   LDL Cholesterol 115 (H) 0 - 99 mg/dL   Total CHOL/HDL Ratio 4     Comment:                Men          Women1/2 Average Risk     3.4          3.3Average Risk          5.0  4.42X Average Risk          9.6          7.13X Average Risk          15.0          11.0                       NonHDL 131.10     Comment: NOTE:  Non-HDL goal should be 30 mg/dL higher than patient's LDL goal (i.e. LDL goal of < 70 mg/dL, would have non-HDL goal of < 100 mg/dL)    Assessment/Plan: Generalized  anxiety disorder Increased anxiety due to significant stressors.  Discussed treatment options -- patient does not wish to increase Citalopram or try new medications.  Discussed proper use of Xanax for this.  Refill given.  Encourage counseling.  Handout on [employer] given so patient can set up appointments.

## 2014-07-19 NOTE — Patient Instructions (Signed)
Please call to set up an appointment with counseling services.  This will be very beneficial.  Please let us know if you've reconsidered increasing the Citalopram. Follow-up with Dr. Charlett Blake as scheduled.

## 2014-07-20 ENCOUNTER — Telehealth: Payer: Self-pay | Admitting: *Deleted

## 2014-07-20 ENCOUNTER — Other Ambulatory Visit: Payer: Self-pay | Admitting: *Deleted

## 2014-07-20 DIAGNOSIS — I10 Essential (primary) hypertension: Secondary | ICD-10-CM

## 2014-07-20 MED ORDER — AMLODIPINE BESYLATE 5 MG PO TABS: 5.0000 mg | ORAL_TABLET | Freq: Every day | ORAL | Status: DC

## 2014-07-20 MED ORDER — TRIAMTERENE-HCTZ 37.5-25 MG PO TABS
1.0000 | ORAL_TABLET | Freq: Every morning | ORAL | Status: DC
Start: 2014-07-20 — End: 2014-10-10

## 2014-07-20 MED ORDER — NEBIVOLOL HCL 10 MG PO TABS: 30.0000 mg | ORAL_TABLET | Freq: Every day | ORAL | Status: DC

## 2014-07-20 MED ORDER — LOSARTAN POTASSIUM 100 MG PO TABS: ORAL_TABLET | ORAL | Status: DC

## 2014-07-20 NOTE — Telephone Encounter (Signed)
Opened in errror 

## 2014-07-20 NOTE — Telephone Encounter (Signed)
Requested drug refills are approved.  Rx sent to the pharmacy by e-script.//AB/CMA

## 2014-07-21 ENCOUNTER — Ambulatory Visit: Payer: 59 | Admitting: Family Medicine

## 2014-07-24 NOTE — Telephone Encounter (Signed)
Received completed forms from Dr. Charlett Blake. Called and informed pt that they are up front for pick up. There is also a section for the pt to completed and she was notified of this.

## 2014-08-02 NOTE — Telephone Encounter (Signed)
Pt completed her section and forms were faxed to [employer] at [phone] successfully. Sent for scanning. JG//CMA

## 2014-08-04 ENCOUNTER — Telehealth: Payer: Self-pay | Admitting: Family Medicine

## 2014-08-04 MED ORDER — CITALOPRAM HYDROBROMIDE 20 MG PO TABS: 20.0000 mg | ORAL_TABLET | Freq: Every day | ORAL | Status: DC

## 2014-08-04 NOTE — Telephone Encounter (Signed)
Increase Citalopram to 20 mg tab, 1 tab po daily, disp #30 with 1 rf, d/c 10 mg tab and have her keep her appt next week

## 2014-08-04 NOTE — Telephone Encounter (Signed)
Patient is currently on citalopram and it is not working. The patient has scheduled with you on Monday 08/07/14. She is crying all the time, feels like she is having a nervous breakdown, thinks she may need counseling, does not want to increase her medication and she would like PCP to take her out of work due to depression/anxiety as she is unable to focus.

## 2014-08-04 NOTE — Telephone Encounter (Signed)
Caller name:Majer, Camyra Relation to KC:XWNP Call back [place]:  Reason for call: pt would like for you to give her a call regarding her anti-depression meds

## 2014-08-04 NOTE — Telephone Encounter (Signed)
D/c citalopram 10 and sent in citalopram 20 mg to [facility].  Patient contacted and informed of increase in medication and to keep appointment on Monday with PCP.

## 2014-08-07 ENCOUNTER — Ambulatory Visit: Payer: 59 | Admitting: Family Medicine

## 2014-08-07 DIAGNOSIS — Z0289 Encounter for other administrative examinations: Secondary | ICD-10-CM

## 2014-08-09 ENCOUNTER — Emergency Department (HOSPITAL_COMMUNITY)
Admission: EM | Admit: 2014-08-09 | Discharge: 2014-08-09 | Disposition: A | Discharge status: Home / Self Care | Payer: 59 | Attending: Emergency Medicine | Admitting: Emergency Medicine

## 2014-08-09 ENCOUNTER — Emergency Department (HOSPITAL_COMMUNITY): Payer: 59

## 2014-08-09 ENCOUNTER — Encounter (HOSPITAL_COMMUNITY): Payer: Self-pay | Admitting: *Deleted

## 2014-08-09 DIAGNOSIS — F418 Other specified anxiety disorders: Secondary | ICD-10-CM | POA: Diagnosis not present

## 2014-08-09 DIAGNOSIS — Z79899 Other long term (current) drug therapy: Secondary | ICD-10-CM | POA: Insufficient documentation

## 2014-08-09 DIAGNOSIS — X58XXXA Exposure to other specified factors, initial encounter: Secondary | ICD-10-CM | POA: Insufficient documentation

## 2014-08-09 DIAGNOSIS — I10 Essential (primary) hypertension: Secondary | ICD-10-CM | POA: Diagnosis not present

## 2014-08-09 DIAGNOSIS — E669 Obesity, unspecified: Secondary | ICD-10-CM | POA: Insufficient documentation

## 2014-08-09 DIAGNOSIS — E119 Type 2 diabetes mellitus without complications: Secondary | ICD-10-CM | POA: Diagnosis not present

## 2014-08-09 DIAGNOSIS — S99912A Unspecified injury of left ankle, initial encounter: Secondary | ICD-10-CM | POA: Diagnosis present

## 2014-08-09 DIAGNOSIS — Y9301 Activity, walking, marching and hiking: Secondary | ICD-10-CM | POA: Insufficient documentation

## 2014-08-09 DIAGNOSIS — Y9289 Other specified places as the place of occurrence of the external cause: Secondary | ICD-10-CM | POA: Insufficient documentation

## 2014-08-09 DIAGNOSIS — Z8701 Personal history of pneumonia (recurrent): Secondary | ICD-10-CM | POA: Insufficient documentation

## 2014-08-09 DIAGNOSIS — Y990 Civilian activity done for income or pay: Secondary | ICD-10-CM | POA: Insufficient documentation

## 2014-08-09 DIAGNOSIS — S93402A Sprain of unspecified ligament of left ankle, initial encounter: Secondary | ICD-10-CM | POA: Diagnosis not present

## 2014-08-09 DIAGNOSIS — Z8619 Personal history of other infectious and parasitic diseases: Secondary | ICD-10-CM | POA: Insufficient documentation

## 2014-08-09 NOTE — Discharge Instructions (Signed)
Please read attached information   Ankle Exercises for Rehabilitation      Following ankle injuries, it is as important to follow your caregiver's instructions for regaining full use of your ankle as it was to follow the initial treatment plan following the injury. The following are some suggestions for exercises and treatment, which can be done to help you regain full use of your ankle as soon as possible.  Follow all instructions regarding physical therapy.  Before exercising, it may be helpful to use heat on the muscles or joint being exercised. This loosens up the muscles and tendons (cordlike structure) and decreases chances of injury during your exercises. If this is not possible, just begin your exercises slowly to gradually warm up.  Stand on your toes several times per day to strengthen the calf muscles. These are the muscles in the back of your leg between the knee and the heel. The cord you can feel just above the heel is the Achilles tendon. Rise up on your toes several times repeating this three to four times per day. Do not exercise to the point of pain. If pain starts to develop, decrease the exercise until you are comfortable again.  Do range of motion exercises. This means moving the ankle in all directions. Practice writing the alphabet with your toes in the air. Do not increase beyond a range that is comfortable.  Increase the strength of the muscles in the front of your leg by raising your toes and foot straight up in the air. Repeat this exercise as you did the calf exercise with the same warnings. This also help to stretch your muscles.  Stretch your calf muscles also by leaning against a wall with your hands in front of you. Put your feet a few feet from the wall and bend your knees until you feel the muscles in your calves become tight.  After exercising it may be helpful to put ice on the ankle to prevent swelling and improve rehabilitation. This may be done for 15 to  20 minutes following your exercises. If exercising is being done in the workplace, this may not always be possible.  Taping an ankle injury may be helpful to give added support following an injury. It also may help prevent reinjury. This may be true if you are in training or in a conditioning program. You and your caregiver can decide on the best course of action to follow. Document Released: 02/15/2000 Document Revised: 07/04/2013 Document Reviewed: 02/12/2008 [facility] Patient Information 2015 [place], [place]. This information is not intended to replace advice given to you by your health care provider. Make sure you discuss any questions you have with your health care provider.  Ankle Sprain An ankle sprain is an injury to the strong, fibrous tissues (ligaments) that hold the bones of your ankle joint together.  CAUSES An ankle sprain is usually caused by a fall or by twisting your ankle. Ankle sprains most commonly occur when you step on the outer edge of your foot, and your ankle turns inward. People who participate in sports are more prone to these types of injuries.  SYMPTOMS   Pain in your ankle. The pain may be present at rest or only when you are trying to stand or walk.  Swelling.  Bruising. Bruising may develop immediately or within 1 to 2 days after your injury.  Difficulty standing or walking, particularly when turning corners or changing directions. DIAGNOSIS  Your caregiver will ask you details about your  injury and perform a physical exam of your ankle to determine if you have an ankle sprain. During the physical exam, your caregiver will press on and apply pressure to specific areas of your foot and ankle. Your caregiver will try to move your ankle in certain ways. An X-ray exam may be done to be sure a bone was not broken or a ligament did not separate from one of the bones in your ankle (avulsion fracture).  TREATMENT  Certain types of braces can help stabilize your ankle.  Your caregiver can make a recommendation for this. Your caregiver may recommend the use of medicine for pain. If your sprain is severe, your caregiver may refer you to a surgeon who helps to restore function to parts of your skeletal system (orthopedist) or a physical therapist. Gordon ice to your injury for 1-2 days or as directed by your caregiver. Applying ice helps to reduce inflammation and pain.  Put ice in a plastic bag.  Place a towel between your skin and the bag.  Leave the ice on for 15-20 minutes at a time, every 2 hours while you are awake.  Only take over-the-counter or prescription medicines for pain, discomfort, or fever as directed by your caregiver.  Elevate your injured ankle above the level of your heart as much as possible for 2-3 days.  If your caregiver recommends crutches, use them as instructed. Gradually put weight on the affected ankle. Continue to use crutches or a cane until you can walk without feeling pain in your ankle.  If you have a plaster splint, wear the splint as directed by your caregiver. Do not rest it on anything harder than a pillow for the first 24 hours. Do not put weight on it. Do not get it wet. You may take it off to take a shower or bath.  You may have been given an elastic bandage to wear around your ankle to provide support. If the elastic bandage is too tight (you have numbness or tingling in your foot or your foot becomes cold and blue), adjust the bandage to make it comfortable.  If you have an air splint, you may blow more air into it or let air out to make it more comfortable. You may take your splint off at night and before taking a shower or bath. Wiggle your toes in the splint several times per day to decrease swelling. SEEK MEDICAL CARE IF:   You have rapidly increasing bruising or swelling.  Your toes feel extremely cold or you lose feeling in your foot.  Your pain is not relieved with medicine. SEEK  IMMEDIATE MEDICAL CARE IF:  Your toes are numb or blue.  You have severe pain that is increasing. MAKE SURE YOU:   Understand these instructions.  Will watch your condition.  Will get help right away if you are not doing well or get worse. Document Released: 02/17/2005 Document Revised: 11/12/2011 Document Reviewed: 03/01/2011 [facility] Patient Information 2015 [place], [place]. This information is not intended to replace advice given to you by your health care provider. Make sure you discuss any questions you have with your health care provider.   Please follow-up with her primary care provider in one week if symptoms continue to persist, orthopedic evaluation if they worsen. Please use ibuprofen or Tylenol as needed for pain.

## 2014-08-09 NOTE — ED Notes (Signed)
Pt reports while walking this am, heard pop to left ankle. Ambulatory in triage. Icepack and ace wrap given by nurse at pt's job.

## 2014-08-09 NOTE — ED Provider Notes (Signed)
CSN: [id number]     Arrival date & time 08/09/14  1300 History  This chart was scribed for Lenn Sink, PA-C working with No att. providers found by Mercy Moore, ED Scribe. This patient was seen in room TR03C/TR03C and the patient's care was started at 2:04 PM.   Chief Complaint  Patient presents with  . Ankle Pain   The history is provided by the patient. No language interpreter was used.   HPI Comments: Jennifer Rich is a 49 y.o. female who presents to the Emergency Department with left ankle injury incurred today while at work. Patient reports injuring her ankle while walking in the cafeteria this morning; uncertain of mechanism. Patient wearing flat sandals at the time of injury. Patient locates pain in lateral malleolus and reports shooting, burning pain in her ankle. Patient with full ROM at ankle, but she reports exacerbated pain especially with flexion, ambulation and bearing weight. Patient reports treatment with ibuprofen and ice therapy prior to arrival with relief. Patient reports history of sprained left ankle.    Past Medical History  Diagnosis Date  . Hypertension   . Chicken pox as a child  . Hyperlipidemia   . Vitamin D deficiency   . Thyroid disease   . Allergy   . Hyperglycemia   . Preventative health care 12/18/2012    Has seen Dr Carlota Raspberry for GYN in past  . Type II or unspecified type diabetes mellitus without mention of complication, not stated as uncontrolled   . Cervical cancer screening 01/25/2013  . Anxiety and depression 01/30/2013  . Goiter     Mildly enlarged thyroid TSH normal   . Obesity, unspecified 04/26/2013  . Pneumonia 05/02/2013  . 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 None    . Wisdom tooth extraction  49 yrs old   Family History  Problem Relation Age of Onset  . Heart attack Maternal Grandfather 75  . Heart disease Maternal Grandfather   . Diabetes  Maternal Grandmother     type 2  . Blindness Maternal Grandmother   . Hypertension Paternal Grandmother   . Stroke Paternal Grandfather   . Hypertension Sister   . Hypertension Sister    History  Substance Use Topics  . Smoking status: Never Smoker   . Smokeless tobacco: Never Used  . Alcohol Use: No   OB History    No data available     Review of Systems  All other systems reviewed and are negative.   Allergies  Review of patient's allergies indicates no known allergies.  Home Medications   Prior to Admission medications   Medication Sig Start Date End Date Taking? Authorizing Provider  acetaminophen (TYLENOL) 500 MG tablet Take 500 mg by mouth every 6 (six) hours as needed.    Historical Provider, MD  ALPRAZolam Duanne Moron) 0.25 MG tablet Take 1 tablet (0.25 mg total) by mouth 2 (two) times daily as needed for anxiety. 07/19/14   Brunetta Jeans, PA-C  amLODipine (NORVASC) 5 MG tablet Take 1 tablet (5 mg total) by mouth daily. 07/20/14   Mosie Lukes, MD  citalopram (CELEXA) 20 MG tablet Take 1 tablet (20 mg total) by mouth daily. 08/04/14   Mosie Lukes, MD  losartan (COZAAR) 100 MG tablet TAKE 1 TABLET (100 MG TOTAL) BY MOUTH DAILY. 07/20/14   Mosie Lukes, MD  metFORMIN (GLUCOPHAGE) 500 MG tablet Take 1 tablet (  500 mg total) by mouth daily with breakfast. 04/21/14   Brunetta Jeans, PA-C  nebivolol (BYSTOLIC) 10 MG tablet Take 3 tablets (30 mg total) by mouth daily. 20 mg (2 tablets) in the am and 10 mg (1 tablet) in pm. 07/20/14   Mosie Lukes, MD  triamterene-hydrochlorothiazide (MAXZIDE-25) 37.5-25 MG per tablet Take 1 tablet by mouth every morning. 07/20/14   Mosie Lukes, MD   Triage Vitals: BP 169/113 mmHg  Pulse 88  Temp(Src) 98 F (36.7 C) (Oral)  Resp 16  Ht 5\' 8"  (1.727 m)  Wt 315 lb (142.883 kg)  BMI 47.91 kg/m2  SpO2 98%  LMP 07/16/2014  Physical Exam  Constitutional: She is oriented to person, place, and time. She appears well-developed and  well-nourished. No distress.  HENT:  Head: Normocephalic and atraumatic.  Eyes: Conjunctivae and EOM are normal. Pupils are equal, round, and reactive to light.  Neck: Neck supple. No tracheal deviation present.  Cardiovascular: Normal rate.   Pulmonary/Chest: Effort normal. No respiratory distress.  Musculoskeletal: Normal range of motion.  No pain in proximal fibula. Full ROM of ankle. Pain with flexion. Effusion to left lateral ankle. Tender to palpation inferior to the lateral malleolus. Nontender to palpation of foot or toes. No medial malleolar tenderness. No tenerness to distal tib fib, proximal knee or tib fib. No anterior posterior laxity, no laxity with inversion or eversion of ankle. Pedal pulse intact. ROM intact.    Neurological: She is alert and oriented to person, place, and time.  Skin: Skin is warm and dry.  Psychiatric: She has a normal mood and affect. Her behavior is normal.  Nursing note and vitals reviewed.   ED Course  Procedures (including critical care time)  COORDINATION OF CARE: 2:13 PM- Discussed treatment plan with patient at bedside and patient agreed to plan.   Labs Review Labs Reviewed - No data to display  Imaging Review Dg Ankle Complete Left  08/09/2014   CLINICAL DATA:  Ankle pain. LEFT ankle injury 2 hours ago. Pain and swelling at the lateral aspect of the ankle.  EXAM: LEFT ANKLE COMPLETE - 3+ VIEW  COMPARISON:  01/22/2014 radiographs.  FINDINGS: Ankle mortise congruent. Talar dome intact. Negative for fracture. Os peroneus accessory ossicles incidentally noted. Calcaneal spurs. No interval change compared to prior.  IMPRESSION: No acute osseous abnormality.   Electronically Signed   By: Dereck Ligas M.D.   On: 08/09/2014 13:46     EKG Interpretation None      MDM   Final diagnoses:  Ankle sprain, left, initial encounter    Labs: none  Imaging: DG ankle complete no acute abnormalities  Consults: none  Therapeutics:  none  Assessment: Ankle sprain  Plan: Patient presents with an ankle sprain and mild effusion. Patient reports that the pain is not significant enough to require pain medication at this time. Patient was given an Ace wrap, crutches and instructed to avoid weight-bearing activities until tolerated. She was instructed to follow-up with [facility] or primary care in 1 week if symptoms continue to persist, sooner as needed. Ibuprofen or Tylenol can be used for pain. [place] instructions. Patient verbalized understanding and agreement today's plan.   I personally performed the services described in this documentation, which was scribed in my presence. The recorded information has been reviewed and is accurate.    Okey Regal, PA-C 08/09/14 2134  Daleen Bo, MD 08/14/14 [phone]

## 2014-08-28 ENCOUNTER — Other Ambulatory Visit: Payer: Self-pay

## 2014-09-20 ENCOUNTER — Telehealth: Payer: Self-pay | Admitting: Family Medicine

## 2014-09-20 NOTE — Telephone Encounter (Signed)
pre visit letter mailed 09/19/14

## 2014-09-29 ENCOUNTER — Emergency Department (HOSPITAL_COMMUNITY): Payer: 59

## 2014-09-29 ENCOUNTER — Encounter (HOSPITAL_COMMUNITY): Payer: Self-pay | Admitting: Emergency Medicine

## 2014-09-29 ENCOUNTER — Emergency Department (HOSPITAL_COMMUNITY)
Admission: EM | Admit: 2014-09-29 | Discharge: 2014-09-29 | Disposition: A | Discharge status: Home / Self Care | Payer: 59 | Attending: Emergency Medicine | Admitting: Emergency Medicine

## 2014-09-29 DIAGNOSIS — W109XXA Fall (on) (from) unspecified stairs and steps, initial encounter: Secondary | ICD-10-CM | POA: Diagnosis not present

## 2014-09-29 DIAGNOSIS — E669 Obesity, unspecified: Secondary | ICD-10-CM | POA: Diagnosis not present

## 2014-09-29 DIAGNOSIS — E119 Type 2 diabetes mellitus without complications: Secondary | ICD-10-CM | POA: Diagnosis not present

## 2014-09-29 DIAGNOSIS — F329 Major depressive disorder, single episode, unspecified: Secondary | ICD-10-CM | POA: Insufficient documentation

## 2014-09-29 DIAGNOSIS — Z8619 Personal history of other infectious and parasitic diseases: Secondary | ICD-10-CM | POA: Insufficient documentation

## 2014-09-29 DIAGNOSIS — Y999 Unspecified external cause status: Secondary | ICD-10-CM | POA: Diagnosis not present

## 2014-09-29 DIAGNOSIS — Z8701 Personal history of pneumonia (recurrent): Secondary | ICD-10-CM | POA: Diagnosis not present

## 2014-09-29 DIAGNOSIS — F419 Anxiety disorder, unspecified: Secondary | ICD-10-CM | POA: Insufficient documentation

## 2014-09-29 DIAGNOSIS — I10 Essential (primary) hypertension: Secondary | ICD-10-CM | POA: Insufficient documentation

## 2014-09-29 DIAGNOSIS — Z79899 Other long term (current) drug therapy: Secondary | ICD-10-CM | POA: Insufficient documentation

## 2014-09-29 DIAGNOSIS — Y9301 Activity, walking, marching and hiking: Secondary | ICD-10-CM | POA: Diagnosis not present

## 2014-09-29 DIAGNOSIS — Y929 Unspecified place or not applicable: Secondary | ICD-10-CM | POA: Insufficient documentation

## 2014-09-29 DIAGNOSIS — S8992XA Unspecified injury of left lower leg, initial encounter: Secondary | ICD-10-CM | POA: Insufficient documentation

## 2014-09-29 MED ORDER — HYDROCODONE-ACETAMINOPHEN 5-325 MG PO TABS: 1.0000 | ORAL_TABLET | Freq: Four times a day (QID) | ORAL | Status: DC | PRN

## 2014-09-29 NOTE — ED Provider Notes (Signed)
CSN: [id number]     Arrival date & time 09/29/14  1011 History  This chart was scribed for non-physician practitioner, Montine Circle, PA-C, working with Tanna Furry, MD by Ladene Artist, ED Scribe. This patient was seen in room TR06C/TR06C and the patient's care was started at 11:01 AM.   Chief Complaint  Patient presents with  . Leg Pain  . Leg Injury   The history is provided by the patient. No language interpreter was used.   HPI Comments: Jennifer Rich is a 49 y.o. female, with a h/o HTN and DM, who presents to the Emergency Department with a chief complaint of a fall that occurred last night. Pt states that she was walking down steps while carrying a laundry basket yesterday when she fell on her bottom and landed with her left leg bent underneath. No head trauma or LOC. Pt reports constant, moderate pain behind her left knee that is exacerbated with bearing weight. She further reports instability in her knee with ambulating and associated joint swelling last night. She applied a heating pad to the affected area last night without relief.   Past Medical History  Diagnosis Date  . Hypertension   . Chicken pox as a child  . Hyperlipidemia   . Vitamin D deficiency   . Thyroid disease   . Allergy   . Hyperglycemia   . Preventative health care 12/18/2012    Has seen Dr Carlota Raspberry for GYN in past  . Type II or unspecified type diabetes mellitus without mention of complication, not stated as uncontrolled   . Cervical cancer screening 01/25/2013  . Anxiety and depression 01/30/2013  . Goiter     Mildly enlarged thyroid TSH normal   . Obesity, unspecified 04/26/2013  . Pneumonia 05/02/2013  . 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 None    . Wisdom tooth extraction  49 yrs old   Family History  Problem Relation Age of Onset  . Heart attack Maternal Grandfather 75  . Heart disease Maternal Grandfather    . Diabetes Maternal Grandmother     type 2  . Blindness Maternal Grandmother   . Hypertension Paternal Grandmother   . Stroke Paternal Grandfather   . Hypertension Sister   . Hypertension Sister    History  Substance Use Topics  . Smoking status: Never Smoker   . Smokeless tobacco: Never Used  . Alcohol Use: No   OB History    No data available     Review of Systems  Constitutional: Negative for fever and chills.  Respiratory: Negative for shortness of breath.   Cardiovascular: Negative for chest pain.  Gastrointestinal: Negative for nausea, vomiting, diarrhea and constipation.  Genitourinary: Negative for dysuria.  Musculoskeletal: Positive for joint swelling and arthralgias.   Allergies  Review of patient's allergies indicates no known allergies.  Home Medications   Prior to Admission medications   Medication Sig Start Date End Date Taking? Authorizing Provider  acetaminophen (TYLENOL) 500 MG tablet Take 500 mg by mouth every 6 (six) hours as needed.    Historical Provider, MD  ALPRAZolam Duanne Moron) 0.25 MG tablet Take 1 tablet (0.25 mg total) by mouth 2 (two) times daily as needed for anxiety. 07/19/14   Brunetta Jeans, PA-C  amLODipine (NORVASC) 5 MG tablet Take 1 tablet (5 mg total) by mouth daily. 07/20/14   Mosie Lukes, MD  citalopram (CELEXA) 20 MG tablet  Take 1 tablet (20 mg total) by mouth daily. 08/04/14   Mosie Lukes, MD  losartan (COZAAR) 100 MG tablet TAKE 1 TABLET (100 MG TOTAL) BY MOUTH DAILY. 07/20/14   Mosie Lukes, MD  metFORMIN (GLUCOPHAGE) 500 MG tablet Take 1 tablet (500 mg total) by mouth daily with breakfast. 04/21/14   Brunetta Jeans, PA-C  nebivolol (BYSTOLIC) 10 MG tablet Take 3 tablets (30 mg total) by mouth daily. 20 mg (2 tablets) in the am and 10 mg (1 tablet) in pm. 07/20/14   Mosie Lukes, MD  triamterene-hydrochlorothiazide (MAXZIDE-25) 37.5-25 MG per tablet Take 1 tablet by mouth every morning. 07/20/14   Mosie Lukes, MD   BP  155/71 mmHg  Pulse 93  Temp(Src) 98.1 F (36.7 C) (Oral)  Resp 17  Ht 5\' 7"  (1.702 m)  Wt 315 lb (142.883 kg)  BMI 49.32 kg/m2  SpO2 98%  LMP 09/15/2014 Physical Exam  Constitutional: She is oriented to person, place, and time. She appears well-developed and well-nourished. No distress.  HENT:  Head: Normocephalic and atraumatic.  Eyes: Conjunctivae and EOM are normal.  Neck: Neck supple. No tracheal deviation present.  Cardiovascular: Normal rate.   Pulmonary/Chest: Effort normal. No respiratory distress.  Musculoskeletal: Normal range of motion.  L knee: ROM and strength of L knee limited secondary to pain. Tenderness to palpation over the medial aspect. No bony abnormality or deformity.y oint stability testing limited secondary to pain and body habitus. Concerned for medial meniscal and MCL injury.   Neurological: She is alert and oriented to person, place, and time.  Skin: Skin is warm and dry.  Psychiatric: She has a normal mood and affect. Her behavior is normal.  Nursing note and vitals reviewed.  ED Course  Procedures (including critical care time) DIAGNOSTIC STUDIES: Oxygen Saturation is 98% on RA, normal by my interpretation.    COORDINATION OF CARE: 11:10 AM-Discussed treatment plan which includes XR , knee immobilizer, crutches and and follow-up with ortho with pt at bedside and pt agreed to plan.   Labs Review Labs Reviewed - No data to display  Imaging Review Dg Knee Complete 4 Views Left  09/29/2014   CLINICAL DATA:  Slipped and fell down the stairs.  EXAM: LEFT KNEE - COMPLETE 4+ VIEW  COMPARISON:  None.  FINDINGS: There is no evidence of fracture, dislocation, or joint effusion. 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 Soft tissues are unremarkable.  IMPRESSION: No acute osseous injury of the left knee.   Electronically Signed   By: Kathreen Devoid   On: 09/29/2014 10:55    EKG Interpretation None      MDM   Final diagnoses:  Knee injury,  left, initial encounter    Patient with left knee injury. Concern for soft tissue injury. Will give knee immobilizer and orthopedic follow-up. Patient understands and agrees with the plan. She is stable and ready for discharge.  I personally performed the services described in this documentation, which was scribed in my presence. The recorded information has been reviewed and is accurate.      Montine Circle, PA-C 09/29/14 1455  Tanna Furry, MD 09/30/14 1004

## 2014-09-29 NOTE — ED Notes (Signed)
C/o left, posterior knee pain after falling down 3 steps while carrying a laundry basket.

## 2014-09-29 NOTE — Discharge Instructions (Signed)
Knee, Cartilage (Meniscus) Injury  It is suspected that you have a torn cartilage (meniscus) in your knee. The menisci are made of tough cartilage and fit between the surfaces of the thigh and leg bones. The menisci are C-shaped and have a wedged profile. The wedged profile helps the stability of the joint by keeping the rounded femur surface from sliding off the flat tibial surface. The menisci are fed (nourished) by small blood vessels, but there is also a large area at the inner edge of the meniscus that does not have a good blood supply (avascular). This presents a problem when there is an injury to the meniscus because areas without good blood supply heal poorly. As a result when there is a torn cartilage in the knee, surgery is often required to fix it. This is usually done with a surgical procedure less invasive than open surgery (arthroscopy). Some times open surgery of the knee is required if there is other damage.  PURPOSE OF THE MENISCUS  The medial meniscus rests on the medial tibial plateau. The tibia is the large bone in your lower leg (the shin bone). The medial tibial plateau is the upper end of the bone making up the inner part of your knee. The lateral meniscus serves the same purpose and is located on the outside of the knee. The menisci help to distribute your body weight across the knee joint; they act as shock absorbers. Without the meniscus present, the weight of your body would be unevenly applied to the bones in your legs (the femur and tibia). The femur is the large bone in your thigh. This uneven weight distribution would cause increased wear and tear on the cartilage lining the joint surfaces, leading to early damage (arthritis) of these areas. The presence of the menisci cartilage is necessary for a healthy knee.  PURPOSE OF THE KNEE CARTILAGE  The knee joint is made up of three bones: the thigh bone (femur), the shin bone (tibia), and the knee cap (patella). The surfaces of these bones  at the knee joint are covered with cartilage called articular cartilage. This smooth, slippery surface allows the bones to slide against each other without causing bone damage. The meniscus sits between these cartilaginous surfaces of the bones. It distributes the weight evenly in the joints and helps with the stability of the joint (keeps the joint steady).  HOME CARE INSTRUCTIONS  · Use crutches and external braces as instructed.  · Once home, an ice pack applied to your injured knee may help with discomfort and keep the swelling down. An ice pack can be used for the first couple of days or as instructed.  · Only take over-the-counter or prescription medicines for pain, discomfort, or fever as directed by your caregiver.  · Call if you do not have relief of pain with medications or if there is increasing in pain.  · Call if your foot becomes cold or blue.  · You may resume normal diet and activities as directed.  · Make sure to keep your appointments with your follow-up caregiver. This injury may require further evaluation and treatment beyond the temporary treatment given today.  Document Released: 05/10/2002 Document Revised: 07/04/2013 Document Reviewed: 09/01/2008  [employer]® Patient Information ©2015 [employer]. This information is not intended to replace advice given to you by your health care provider. Make sure you discuss any questions you have with your health care provider.

## 2014-09-29 NOTE — ED Notes (Signed)
Pt. Stated, I missed 3 steps going down steps to wash clothes and injured my left leg.  Pain is behind knee

## 2014-10-09 ENCOUNTER — Telehealth: Payer: Self-pay | Admitting: Behavioral Health

## 2014-10-09 NOTE — Telephone Encounter (Signed)
Unable to reach patient at time of Pre-Visit Call.  Left message for patient to return call when available.    

## 2014-10-10 ENCOUNTER — Encounter: Payer: Self-pay | Admitting: Family Medicine

## 2014-10-10 ENCOUNTER — Ambulatory Visit (INDEPENDENT_AMBULATORY_CARE_PROVIDER_SITE_OTHER): Payer: 59 | Admitting: Family Medicine

## 2014-10-10 VITALS — BP 160/97 | HR 74 | Temp 97.7°F | Ht 67.0 in | Wt 321.4 lb

## 2014-10-10 DIAGNOSIS — Z0000 Encounter for general adult medical examination without abnormal findings: Secondary | ICD-10-CM | POA: Diagnosis not present

## 2014-10-10 DIAGNOSIS — E669 Obesity, unspecified: Secondary | ICD-10-CM

## 2014-10-10 DIAGNOSIS — E559 Vitamin D deficiency, unspecified: Secondary | ICD-10-CM | POA: Diagnosis not present

## 2014-10-10 DIAGNOSIS — H547 Unspecified visual loss: Secondary | ICD-10-CM | POA: Diagnosis not present

## 2014-10-10 DIAGNOSIS — R197 Diarrhea, unspecified: Secondary | ICD-10-CM

## 2014-10-10 DIAGNOSIS — R739 Hyperglycemia, unspecified: Secondary | ICD-10-CM | POA: Diagnosis not present

## 2014-10-10 DIAGNOSIS — E785 Hyperlipidemia, unspecified: Secondary | ICD-10-CM

## 2014-10-10 DIAGNOSIS — I10 Essential (primary) hypertension: Secondary | ICD-10-CM | POA: Diagnosis not present

## 2014-10-10 MED ORDER — TRIAMTERENE-HCTZ 37.5-25 MG PO TABS: 1.0000 | ORAL_TABLET | Freq: Every morning | ORAL | Status: DC

## 2014-10-10 MED ORDER — LOSARTAN POTASSIUM 100 MG PO TABS: ORAL_TABLET | ORAL | Status: DC

## 2014-10-10 MED ORDER — NEBIVOLOL HCL 10 MG PO TABS: 30.0000 mg | ORAL_TABLET | Freq: Every day | ORAL | Status: DC

## 2014-10-10 MED ORDER — METFORMIN HCL ER (MOD) 500 MG PO TB24: 500.0000 mg | ORAL_TABLET | Freq: Every day | ORAL | Status: DC

## 2014-10-10 MED ORDER — AMLODIPINE BESYLATE 5 MG PO TABS: 5.0000 mg | ORAL_TABLET | Freq: Every day | ORAL | Status: DC

## 2014-10-10 MED ORDER — CITALOPRAM HYDROBROMIDE 20 MG PO TABS: 20.0000 mg | ORAL_TABLET | Freq: Every day | ORAL | Status: DC

## 2014-10-10 NOTE — Patient Instructions (Signed)
Preventive Care for Adults A healthy lifestyle and preventive care can promote health and wellness. Preventive health guidelines for women include the following key practices.  A routine yearly physical is a good way to check with your health care provider about your health and preventive screening. It is a chance to share any concerns and updates on your health and to receive a thorough exam.  Visit your dentist for a routine exam and preventive care every 6 months. Brush your teeth twice a day and floss once a day. Good oral hygiene prevents tooth decay and gum disease.  The frequency of eye exams is based on your age, health, family medical history, use of contact lenses, and other factors. Follow your health care provider's recommendations for frequency of eye exams.  Eat a healthy diet. Foods like vegetables, fruits, whole grains, low-fat dairy products, and lean protein foods contain the nutrients you need without too many calories. Decrease your intake of foods high in solid fats, added sugars, and salt. Eat the right amount of calories for you.Get information about a proper diet from your health care provider, if necessary.  Regular physical exercise is one of the most important things you can do for your health. Most adults should get at least 150 minutes of moderate-intensity exercise (any activity that increases your heart rate and causes you to sweat) each week. In addition, most adults need muscle-strengthening exercises on 2 or more days a week.  Maintain a healthy weight. The body mass index (BMI) is a screening tool to identify possible weight problems. It provides an estimate of body fat based on height and weight. Your health care provider can find your BMI and can help you achieve or maintain a healthy weight.For adults 20 years and older:  A BMI below 18.5 is considered underweight.  A BMI of 18.5 to 24.9 is normal.  A BMI of 25 to 29.9 is considered overweight.  A BMI of  30 and above is considered obese.  Maintain normal blood lipids and cholesterol levels by exercising and minimizing your intake of saturated fat. Eat a balanced diet with plenty of fruit and vegetables. Blood tests for lipids and cholesterol should begin at age 76 and be repeated every 5 years. If your lipid or cholesterol levels are high, you are over 50, or you are at high risk for heart disease, you may need your cholesterol levels checked more frequently.Ongoing high lipid and cholesterol levels should be treated with medicines if diet and exercise are not working.  If you smoke, find out from your health care provider how to quit. If you do not use tobacco, do not start.  Lung cancer screening is recommended for adults aged 22-80 years who are at high risk for developing lung cancer because of a history of smoking. A yearly low-dose CT scan of the lungs is recommended for people who have at least a 30-pack-year history of smoking and are a current smoker or have quit within the past 15 years. A pack year of smoking is smoking an average of 1 pack of cigarettes a day for 1 year (for example: 1 pack a day for 30 years or 2 packs a day for 15 years). Yearly screening should continue until the smoker has stopped smoking for at least 15 years. Yearly screening should be stopped for people who develop a health problem that would prevent them from having lung cancer treatment.  If you are pregnant, do not drink alcohol. If you are breastfeeding,  be very cautious about drinking alcohol. If you are not pregnant and choose to drink alcohol, do not have more than 1 drink per day. One drink is considered to be 12 ounces (355 mL) of beer, 5 ounces (148 mL) of wine, or 1.5 ounces (44 mL) of liquor.  Avoid use of street drugs. Do not share needles with anyone. Ask for help if you need support or instructions about stopping the use of drugs.  High blood pressure causes heart disease and increases the risk of  stroke. Your blood pressure should be checked at least every 1 to 2 years. Ongoing high blood pressure should be treated with medicines if weight loss and exercise do not work.  If you are 75-52 years old, ask your health care provider if you should take aspirin to prevent strokes.  Diabetes screening involves taking a blood sample to check your fasting blood sugar level. This should be done once every 3 years, after age 15, if you are within normal weight and without risk factors for diabetes. Testing should be considered at a younger age or be carried out more frequently if you are overweight and have at least 1 risk factor for diabetes.  Breast cancer screening is essential preventive care for women. You should practice "breast self-awareness." This means understanding the normal appearance and feel of your breasts and may include breast self-examination. Any changes detected, no matter how small, should be reported to a health care provider. Women in their 58s and 30s should have a clinical breast exam (CBE) by a health care provider as part of a regular health exam every 1 to 3 years. After age 16, women should have a CBE every year. Starting at age 53, women should consider having a mammogram (breast X-ray test) every year. Women who have a family history of breast cancer should talk to their health care provider about genetic screening. Women at a high risk of breast cancer should talk to their health care providers about having an MRI and a mammogram every year.  Breast cancer gene (BRCA)-related cancer risk assessment is recommended for women who have family members with BRCA-related cancers. BRCA-related cancers include breast, ovarian, tubal, and peritoneal cancers. Having family members with these cancers may be associated with an increased risk for harmful changes (mutations) in the breast cancer genes BRCA1 and BRCA2. Results of the assessment will determine the need for genetic counseling and  BRCA1 and BRCA2 testing.  Routine pelvic exams to screen for cancer are no longer recommended for nonpregnant women who are considered low risk for cancer of the pelvic organs (ovaries, uterus, and vagina) and who do not have symptoms. Ask your health care provider if a screening pelvic exam is right for you.  If you have had past treatment for cervical cancer or a condition that could lead to cancer, you need Pap tests and screening for cancer for at least 20 years after your treatment. If Pap tests have been discontinued, your risk factors (such as having a new sexual partner) need to be reassessed to determine if screening should be resumed. Some women have medical problems that increase the chance of getting cervical cancer. In these cases, your health care provider may recommend more frequent screening and Pap tests.  The HPV test is an additional test that may be used for cervical cancer screening. The HPV test looks for the virus that can cause the cell changes on the cervix. The cells collected during the Pap test can be  tested for HPV. The HPV test could be used to screen women aged 30 years and older, and should be used in women of any age who have unclear Pap test results. After the age of 30, women should have HPV testing at the same frequency as a Pap test.  Colorectal cancer can be detected and often prevented. Most routine colorectal cancer screening begins at the age of 50 years and continues through age 75 years. However, your health care provider may recommend screening at an earlier age if you have risk factors for colon cancer. On a yearly basis, your health care provider may provide home test kits to check for hidden blood in the stool. Use of a small camera at the end of a tube, to directly examine the colon (sigmoidoscopy or colonoscopy), can detect the earliest forms of colorectal cancer. Talk to your health care provider about this at age 50, when routine screening begins. Direct  exam of the colon should be repeated every 5-10 years through age 75 years, unless early forms of pre-cancerous polyps or small growths are found.  People who are at an increased risk for hepatitis B should be screened for this virus. You are considered at high risk for hepatitis B if:  You were born in a country where hepatitis B occurs often. Talk with your health care provider about which countries are considered high risk.  Your parents were born in a high-risk country and you have not received a shot to protect against hepatitis B (hepatitis B vaccine).  You have HIV or AIDS.  You use needles to inject street drugs.  You live with, or have sex with, someone who has hepatitis B.  You get hemodialysis treatment.  You take certain medicines for conditions like cancer, organ transplantation, and autoimmune conditions.  Hepatitis C blood testing is recommended for all people born from 1945 through 1965 and any individual with known risks for hepatitis C.  Practice safe sex. Use condoms and avoid high-risk sexual practices to reduce the spread of sexually transmitted infections (STIs). STIs include gonorrhea, chlamydia, syphilis, trichomonas, herpes, HPV, and human immunodeficiency virus (HIV). Herpes, HIV, and HPV are viral illnesses that have no cure. They can result in disability, cancer, and death.  You should be screened for sexually transmitted illnesses (STIs) including gonorrhea and chlamydia if:  You are sexually active and are younger than 24 years.  You are older than 24 years and your health care provider tells you that you are at risk for this type of infection.  Your sexual activity has changed since you were last screened and you are at an increased risk for chlamydia or gonorrhea. Ask your health care provider if you are at risk.  If you are at risk of being infected with HIV, it is recommended that you take a prescription medicine daily to prevent HIV infection. This is  called preexposure prophylaxis (PrEP). You are considered at risk if:  You are a heterosexual woman, are sexually active, and are at increased risk for HIV infection.  You take drugs by injection.  You are sexually active with a partner who has HIV.  Talk with your health care provider about whether you are at high risk of being infected with HIV. If you choose to begin PrEP, you should first be tested for HIV. You should then be tested every 3 months for as long as you are taking PrEP.  Osteoporosis is a disease in which the bones lose minerals and strength   with aging. This can result in serious bone fractures or breaks. The risk of osteoporosis can be identified using a bone density scan. Women ages 65 years and over and women at risk for fractures or osteoporosis should discuss screening with their health care providers. Ask your health care provider whether you should take a calcium supplement or vitamin D to reduce the rate of osteoporosis.  Menopause can be associated with physical symptoms and risks. Hormone replacement therapy is available to decrease symptoms and risks. You should talk to your health care provider about whether hormone replacement therapy is right for you.  Use sunscreen. Apply sunscreen liberally and repeatedly throughout the day. You should seek shade when your shadow is shorter than you. Protect yourself by wearing long sleeves, pants, a wide-brimmed hat, and sunglasses year round, whenever you are outdoors.  Once a month, do a whole body skin exam, using a mirror to look at the skin on your back. Tell your health care provider of new moles, moles that have irregular borders, moles that are larger than a pencil eraser, or moles that have changed in shape or color.  Stay current with required vaccines (immunizations).  Influenza vaccine. All adults should be immunized every year.  Tetanus, diphtheria, and acellular pertussis (Td, Tdap) vaccine. Pregnant women should  receive 1 dose of Tdap vaccine during each pregnancy. The dose should be obtained regardless of the length of time since the last dose. Immunization is preferred during the 27th-36th week of gestation. An adult who has not previously received Tdap or who does not know her vaccine status should receive 1 dose of Tdap. This initial dose should be followed by tetanus and diphtheria toxoids (Td) booster doses every 10 years. Adults with an unknown or incomplete history of completing a 3-dose immunization series with Td-containing vaccines should begin or complete a primary immunization series including a Tdap dose. Adults should receive a Td booster every 10 years.  Varicella vaccine. An adult without evidence of immunity to varicella should receive 2 doses or a second dose if she has previously received 1 dose. Pregnant females who do not have evidence of immunity should receive the first dose after pregnancy. This first dose should be obtained before leaving the health care facility. The second dose should be obtained 4-8 weeks after the first dose.  Human papillomavirus (HPV) vaccine. Females aged 13-26 years who have not received the vaccine previously should obtain the 3-dose series. The vaccine is not recommended for use in pregnant females. However, pregnancy testing is not needed before receiving a dose. If a female is found to be pregnant after receiving a dose, no treatment is needed. In that case, the remaining doses should be delayed until after the pregnancy. Immunization is recommended for any person with an immunocompromised condition through the age of 26 years if she did not get any or all doses earlier. During the 3-dose series, the second dose should be obtained 4-8 weeks after the first dose. The third dose should be obtained 24 weeks after the first dose and 16 weeks after the second dose.  Zoster vaccine. One dose is recommended for adults aged 60 years or older unless certain conditions are  present.  Measles, mumps, and rubella (MMR) vaccine. Adults born before 1957 generally are considered immune to measles and mumps. Adults born in 1957 or later should have 1 or more doses of MMR vaccine unless there is a contraindication to the vaccine or there is laboratory evidence of immunity to   each of the three diseases. A routine second dose of MMR vaccine should be obtained at least 28 days after the first dose for students attending postsecondary schools, health care workers, or international travelers. People who received inactivated measles vaccine or an unknown type of measles vaccine during 1963-1967 should receive 2 doses of MMR vaccine. People who received inactivated mumps vaccine or an unknown type of mumps vaccine before 1979 and are at high risk for mumps infection should consider immunization with 2 doses of MMR vaccine. For females of childbearing age, rubella immunity should be determined. If there is no evidence of immunity, females who are not pregnant should be vaccinated. If there is no evidence of immunity, females who are pregnant should delay immunization until after pregnancy. Unvaccinated health care workers born before 1957 who lack laboratory evidence of measles, mumps, or rubella immunity or laboratory confirmation of disease should consider measles and mumps immunization with 2 doses of MMR vaccine or rubella immunization with 1 dose of MMR vaccine.  Pneumococcal 13-valent conjugate (PCV13) vaccine. When indicated, a person who is uncertain of her immunization history and has no record of immunization should receive the PCV13 vaccine. An adult aged 19 years or older who has certain medical conditions and has not been previously immunized should receive 1 dose of PCV13 vaccine. This PCV13 should be followed with a dose of pneumococcal polysaccharide (PPSV23) vaccine. The PPSV23 vaccine dose should be obtained at least 8 weeks after the dose of PCV13 vaccine. An adult aged 19  years or older who has certain medical conditions and previously received 1 or more doses of PPSV23 vaccine should receive 1 dose of PCV13. The PCV13 vaccine dose should be obtained 1 or more years after the last PPSV23 vaccine dose.  Pneumococcal polysaccharide (PPSV23) vaccine. When PCV13 is also indicated, PCV13 should be obtained first. All adults aged 65 years and older should be immunized. An adult younger than age 65 years who has certain medical conditions should be immunized. Any person who resides in a nursing home or long-term care facility should be immunized. An adult smoker should be immunized. People with an immunocompromised condition and certain other conditions should receive both PCV13 and PPSV23 vaccines. People with human immunodeficiency virus (HIV) infection should be immunized as soon as possible after diagnosis. Immunization during chemotherapy or radiation therapy should be avoided. Routine use of PPSV23 vaccine is not recommended for American Indians, [place] Natives, or people younger than 65 years unless there are medical conditions that require PPSV23 vaccine. When indicated, people who have unknown immunization and have no record of immunization should receive PPSV23 vaccine. One-time revaccination 5 years after the first dose of PPSV23 is recommended for people aged 19-64 years who have chronic kidney failure, nephrotic syndrome, asplenia, or immunocompromised conditions. People who received 1-2 doses of PPSV23 before age 65 years should receive another dose of PPSV23 vaccine at age 65 years or later if at least 5 years have passed since the previous dose. Doses of PPSV23 are not needed for people immunized with PPSV23 at or after age 65 years.  Meningococcal vaccine. Adults with asplenia or persistent complement component deficiencies should receive 2 doses of quadrivalent meningococcal conjugate (MenACWY-D) vaccine. The doses should be obtained at least 2 months apart.  Microbiologists working with certain meningococcal bacteria, military recruits, people at risk during an outbreak, and people who travel to or live in countries with a high rate of meningitis should be immunized. A first-year college student up through age   21 years who is living in a residence hall should receive a dose if she did not receive a dose on or after her 16th birthday. Adults who have certain high-risk conditions should receive one or more doses of vaccine.  Hepatitis A vaccine. Adults who wish to be protected from this disease, have certain high-risk conditions, work with hepatitis A-infected animals, work in hepatitis A research labs, or travel to or work in countries with a high rate of hepatitis A should be immunized. Adults who were previously unvaccinated and who anticipate close contact with an international adoptee during the first 60 days after arrival in the [place] from a country with a high rate of hepatitis A should be immunized.  Hepatitis B vaccine. Adults who wish to be protected from this disease, have certain high-risk conditions, may be exposed to blood or other infectious body fluids, are household contacts or sex partners of hepatitis B positive people, are clients or workers in certain care facilities, or travel to or work in countries with a high rate of hepatitis B should be immunized.  Haemophilus influenzae type b (Hib) vaccine. A previously unvaccinated person with asplenia or sickle cell disease or having a scheduled splenectomy should receive 1 dose of Hib vaccine. Regardless of previous immunization, a recipient of a hematopoietic stem cell transplant should receive a 3-dose series 6-12 months after her successful transplant. Hib vaccine is not recommended for adults with HIV infection. Preventive Services / Frequency Ages 64 to 68 years  Blood pressure check.** / Every 1 to 2 years.  Lipid and cholesterol check.** / Every 5 years beginning at age  22.  Clinical breast exam.** / Every 3 years for women in their 88s and 53s.  BRCA-related cancer risk assessment.** / For women who have family members with a BRCA-related cancer (breast, ovarian, tubal, or peritoneal cancers).  Pap test.** / Every 2 years from ages 90 through 51. Every 3 years starting at age 21 through age 56 or 3 with a history of 3 consecutive normal Pap tests.  HPV screening.** / Every 3 years from ages 24 through ages 1 to 46 with a history of 3 consecutive normal Pap tests.  Hepatitis C blood test.** / For any individual with known risks for hepatitis C.  Skin self-exam. / Monthly.  Influenza vaccine. / Every year.  Tetanus, diphtheria, and acellular pertussis (Tdap, Td) vaccine.** / Consult your health care provider. Pregnant women should receive 1 dose of Tdap vaccine during each pregnancy. 1 dose of Td every 10 years.  Varicella vaccine.** / Consult your health care provider. Pregnant females who do not have evidence of immunity should receive the first dose after pregnancy.  HPV vaccine. / 3 doses over 6 months, if 72 and younger. The vaccine is not recommended for use in pregnant females. However, pregnancy testing is not needed before receiving a dose.  Measles, mumps, rubella (MMR) vaccine.** / You need at least 1 dose of MMR if you were born in 1957 or later. You may also need a 2nd dose. For females of childbearing age, rubella immunity should be determined. If there is no evidence of immunity, females who are not pregnant should be vaccinated. If there is no evidence of immunity, females who are pregnant should delay immunization until after pregnancy.  Pneumococcal 13-valent conjugate (PCV13) vaccine.** / Consult your health care provider.  Pneumococcal polysaccharide (PPSV23) vaccine.** / 1 to 2 doses if you smoke cigarettes or if you have certain conditions.  Meningococcal vaccine.** /  1 dose if you are age 19 to 21 years and a first-year college  student living in a residence hall, or have one of several medical conditions, you need to get vaccinated against meningococcal disease. You may also need additional booster doses.  Hepatitis A vaccine.** / Consult your health care provider.  Hepatitis B vaccine.** / Consult your health care provider.  Haemophilus influenzae type b (Hib) vaccine.** / Consult your health care provider. Ages 40 to 64 years  Blood pressure check.** / Every 1 to 2 years.  Lipid and cholesterol check.** / Every 5 years beginning at age 20 years.  Lung cancer screening. / Every year if you are aged 55-80 years and have a 30-pack-year history of smoking and currently smoke or have quit within the past 15 years. Yearly screening is stopped once you have quit smoking for at least 15 years or develop a health problem that would prevent you from having lung cancer treatment.  Clinical breast exam.** / Every year after age 40 years.  BRCA-related cancer risk assessment.** / For women who have family members with a BRCA-related cancer (breast, ovarian, tubal, or peritoneal cancers).  Mammogram.** / Every year beginning at age 40 years and continuing for as long as you are in good health. Consult with your health care provider.  Pap test.** / Every 3 years starting at age 30 years through age 65 or 70 years with a history of 3 consecutive normal Pap tests.  HPV screening.** / Every 3 years from ages 30 years through ages 65 to 70 years with a history of 3 consecutive normal Pap tests.  Fecal occult blood test (FOBT) of stool. / Every year beginning at age 50 years and continuing until age 75 years. You may not need to do this test if you get a colonoscopy every 10 years.  Flexible sigmoidoscopy or colonoscopy.** / Every 5 years for a flexible sigmoidoscopy or every 10 years for a colonoscopy beginning at age 50 years and continuing until age 75 years.  Hepatitis C blood test.** / For all people born from 1945 through  1965 and any individual with known risks for hepatitis C.  Skin self-exam. / Monthly.  Influenza vaccine. / Every year.  Tetanus, diphtheria, and acellular pertussis (Tdap/Td) vaccine.** / Consult your health care provider. Pregnant women should receive 1 dose of Tdap vaccine during each pregnancy. 1 dose of Td every 10 years.  Varicella vaccine.** / Consult your health care provider. Pregnant females who do not have evidence of immunity should receive the first dose after pregnancy.  Zoster vaccine.** / 1 dose for adults aged 60 years or older.  Measles, mumps, rubella (MMR) vaccine.** / You need at least 1 dose of MMR if you were born in 1957 or later. You may also need a 2nd dose. For females of childbearing age, rubella immunity should be determined. If there is no evidence of immunity, females who are not pregnant should be vaccinated. If there is no evidence of immunity, females who are pregnant should delay immunization until after pregnancy.  Pneumococcal 13-valent conjugate (PCV13) vaccine.** / Consult your health care provider.  Pneumococcal polysaccharide (PPSV23) vaccine.** / 1 to 2 doses if you smoke cigarettes or if you have certain conditions.  Meningococcal vaccine.** / Consult your health care provider.  Hepatitis A vaccine.** / Consult your health care provider.  Hepatitis B vaccine.** / Consult your health care provider.  Haemophilus influenzae type b (Hib) vaccine.** / Consult your health care provider. Ages 65   years and over  Blood pressure check.** / Every 1 to 2 years.  Lipid and cholesterol check.** / Every 5 years beginning at age 22 years.  Lung cancer screening. / Every year if you are aged 73-80 years and have a 30-pack-year history of smoking and currently smoke or have quit within the past 15 years. Yearly screening is stopped once you have quit smoking for at least 15 years or develop a health problem that would prevent you from having lung cancer  treatment.  Clinical breast exam.** / Every year after age 4 years.  BRCA-related cancer risk assessment.** / For women who have family members with a BRCA-related cancer (breast, ovarian, tubal, or peritoneal cancers).  Mammogram.** / Every year beginning at age 40 years and continuing for as long as you are in good health. Consult with your health care provider.  Pap test.** / Every 3 years starting at age 9 years through age 34 or 91 years with 3 consecutive normal Pap tests. Testing can be stopped between 65 and 70 years with 3 consecutive normal Pap tests and no abnormal Pap or HPV tests in the past 10 years.  HPV screening.** / Every 3 years from ages 57 years through ages 64 or 45 years with a history of 3 consecutive normal Pap tests. Testing can be stopped between 65 and 70 years with 3 consecutive normal Pap tests and no abnormal Pap or HPV tests in the past 10 years.  Fecal occult blood test (FOBT) of stool. / Every year beginning at age 15 years and continuing until age 17 years. You may not need to do this test if you get a colonoscopy every 10 years.  Flexible sigmoidoscopy or colonoscopy.** / Every 5 years for a flexible sigmoidoscopy or every 10 years for a colonoscopy beginning at age 86 years and continuing until age 71 years.  Hepatitis C blood test.** / For all people born from [birth year] through 1964 and any individual with known risks for hepatitis C.  Osteoporosis screening.** / A one-time screening for women ages 83 years and over and women at risk for fractures or osteoporosis.  Skin self-exam. / Monthly.  Influenza vaccine. / Every year.  Tetanus, diphtheria, and acellular pertussis (Tdap/Td) vaccine.** / 1 dose of Td every 10 years.  Varicella vaccine.** / Consult your health care provider.  Zoster vaccine.** / 1 dose for adults aged 61 years or older.  Pneumococcal 13-valent conjugate (PCV13) vaccine.** / Consult your health care provider.  Pneumococcal  polysaccharide (PPSV23) vaccine.** / 1 dose for all adults aged 28 years and older.  Meningococcal vaccine.** / Consult your health care provider.  Hepatitis A vaccine.** / Consult your health care provider.  Hepatitis B vaccine.** / Consult your health care provider.  Haemophilus influenzae type b (Hib) vaccine.** / Consult your health care provider. ** Family history and personal history of risk and conditions may change your health care provider's recommendations. Document Released: 04/15/2001 Document Revised: 07/04/2013 Document Reviewed: 07/15/2010 [facility] Patient Information 2015 [place], [place]. This information is not intended to replace advice given to you by your health care provider. Make sure you discuss any questions you have with your health care provider.

## 2014-10-10 NOTE — Progress Notes (Signed)
Pre visit review using our clinic review tool, if applicable. No additional management support is needed unless otherwise documented below in the visit note. 

## 2014-10-11 LAB — CBC
HEMATOCRIT: 40.3 % (ref 36.0–46.0)
Hemoglobin: 13.2 g/dL (ref 12.0–15.0)
MCHC: 32.8 g/dL (ref 30.0–36.0)
MCV: 76.8 fl — AB (ref 78.0–100.0)
PLATELETS: 192 10*3/uL (ref 150.0–400.0)
RBC: 5.25 Mil/uL — ABNORMAL HIGH (ref 3.87–5.11)
RDW: 16.1 % — ABNORMAL HIGH (ref 11.5–15.5)
WBC: 7.9 10*3/uL (ref 4.0–10.5)

## 2014-10-11 LAB — COMPREHENSIVE METABOLIC PANEL
ALT: 7 U/L (ref 0–35)
AST: 16 U/L (ref 0–37)
Albumin: 3.7 g/dL (ref 3.5–5.2)
Alkaline Phosphatase: 72 U/L (ref 39–117)
BUN: 13 mg/dL (ref 6–23)
CALCIUM: 9.2 mg/dL (ref 8.4–10.5)
CO2: 25 meq/L (ref 19–32)
CREATININE: 0.83 mg/dL (ref 0.40–1.20)
Chloride: 104 mEq/L (ref 96–112)
GFR: 94.13 mL/min (ref >60.00)
Glucose, Bld: 93 mg/dL (ref 70–99)
Potassium: 3.9 mEq/L (ref 3.5–5.1)
Sodium: 137 mEq/L (ref 135–145)
Total Bilirubin: 0.4 mg/dL (ref 0.2–1.2)
Total Protein: 7.5 g/dL (ref 6.0–8.3)

## 2014-10-11 LAB — LIPID PANEL
CHOL/HDL RATIO: 5
Cholesterol: 195 mg/dL (ref 0–200)
HDL: 41.4 mg/dL (ref >39.00)
LDL Cholesterol: 128 mg/dL — ABNORMAL HIGH (ref 0–99)
NonHDL: 154.03
Triglycerides: 132 mg/dL (ref 0.0–149.0)
VLDL: 26.4 mg/dL (ref 0.0–40.0)

## 2014-10-11 LAB — VITAMIN D 25 HYDROXY (VIT D DEFICIENCY, FRACTURES): VITD: 12.82 ng/mL — ABNORMAL LOW (ref 30.00–100.00)

## 2014-10-11 LAB — TSH: TSH: 2.49 u[IU]/mL (ref 0.35–4.50)

## 2014-10-11 LAB — HEMOGLOBIN A1C: HEMOGLOBIN A1C: 6.3 % (ref 4.6–6.5)

## 2014-10-11 IMAGING — CR DG CHEST 2V
2 series · 2 of 2 positions shown · non-contrast
Comparison: 03/21/13

CLINICAL DATA: Cough.  Chest pain.  Wheezing.

EXAM:
CHEST  2 VIEW

[w chest pa]
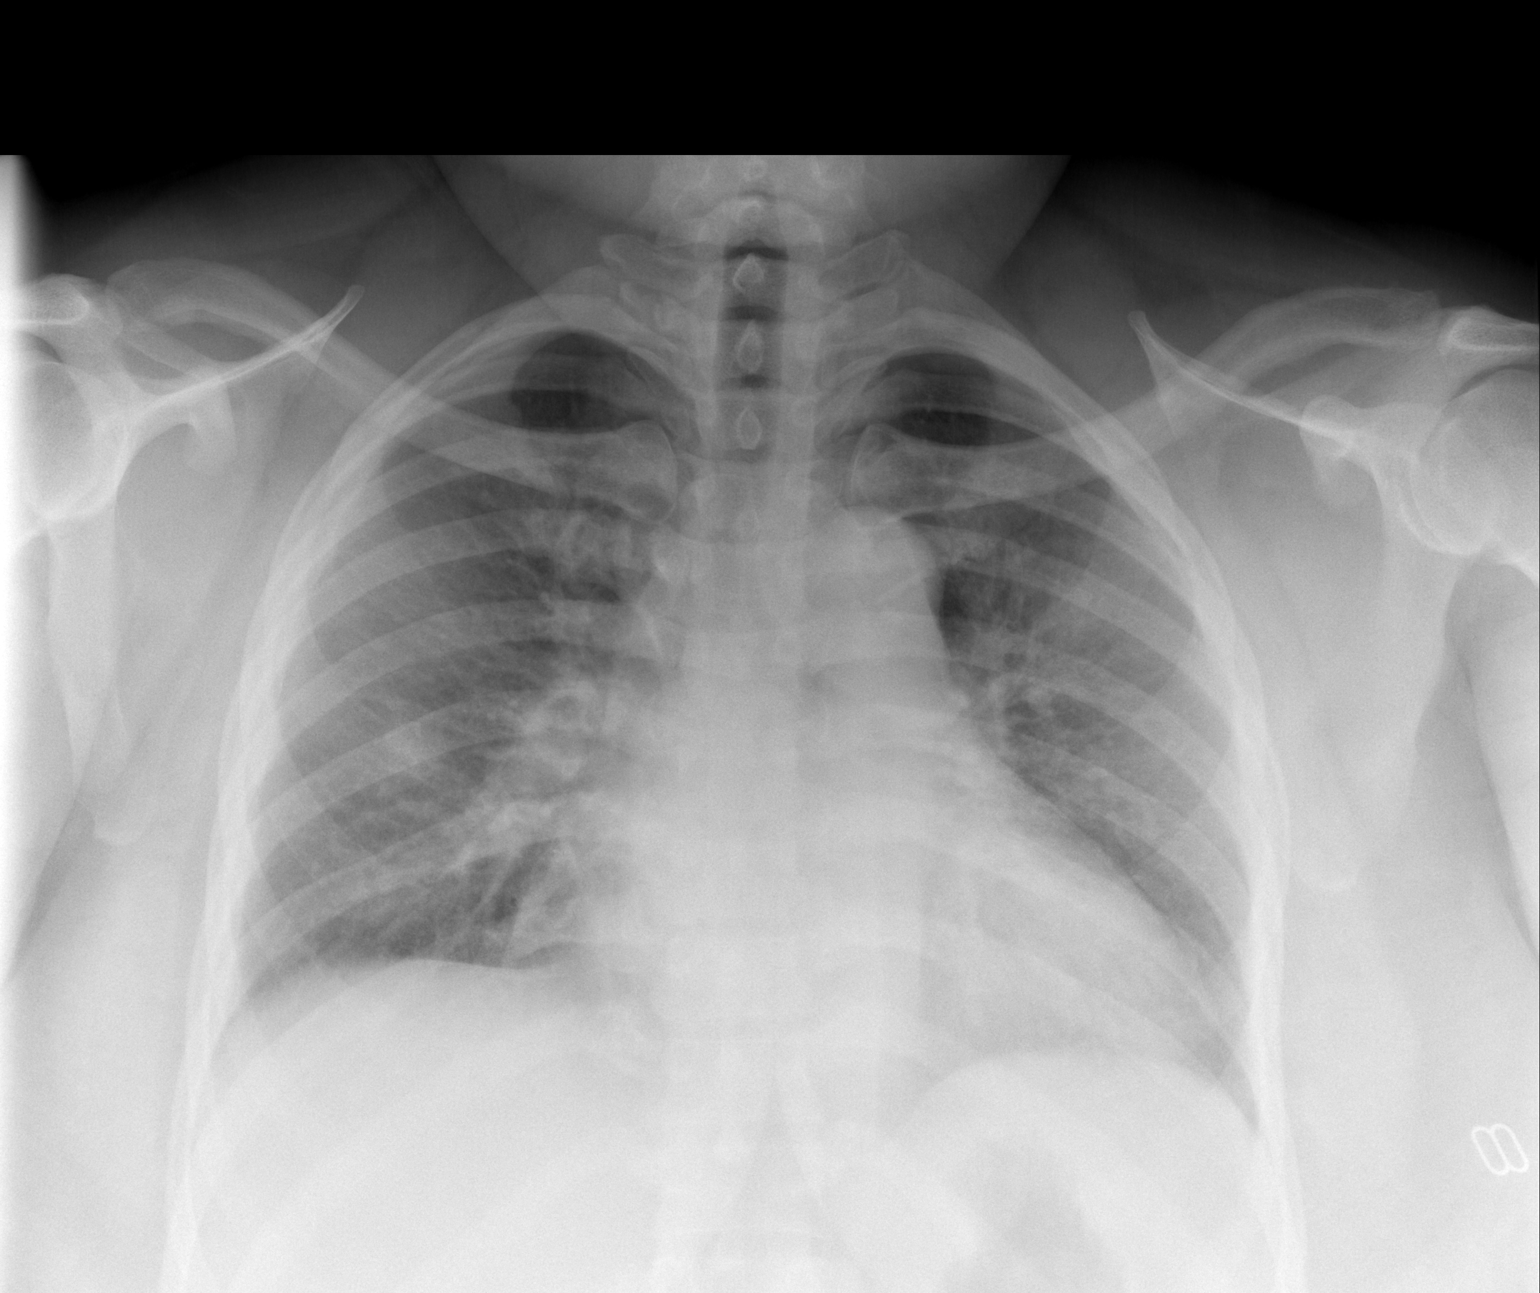

[w chest lat]
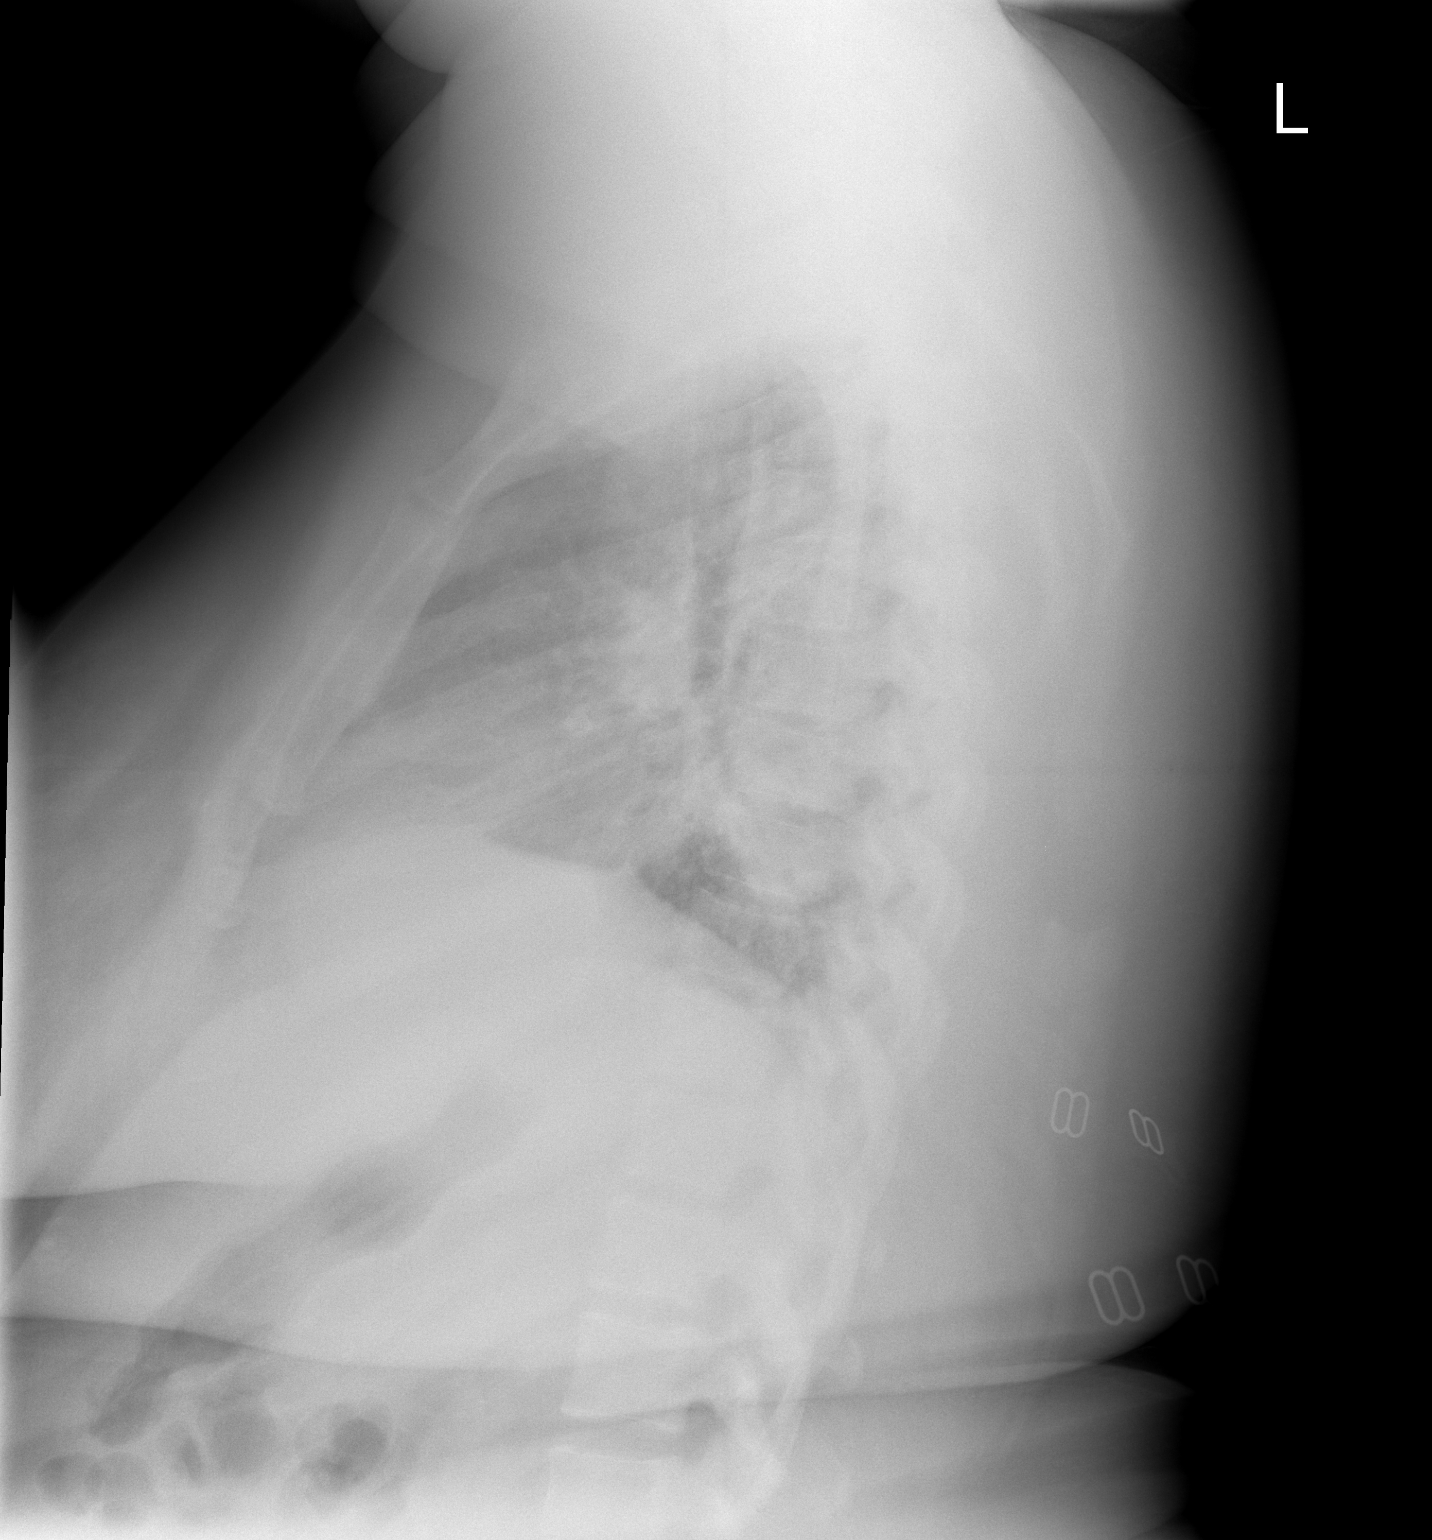

[2 of 2 positions shown; findings below may reference images not displayed]

FINDINGS: A new focus airspace opacity is seen in right midlung which was not
seen on recent study, and is suspicious for an area of developing
infiltrate. Left lung remains clear. Low lung volumes again seen. No
evidence of pleural effusion. Heart size remains within normal
limits.
IMPRESSION: New focus of airspace opacity in the right mid lung, suspicious for
developing infiltrate/pneumonia. Recommend short-term radiographic
followup in several weeks to confirm resolution.

## 2014-10-12 ENCOUNTER — Telehealth: Payer: Self-pay | Admitting: Family Medicine

## 2014-10-12 ENCOUNTER — Other Ambulatory Visit: Payer: Self-pay | Admitting: Family Medicine

## 2014-10-12 MED ORDER — ERGOCALCIFEROL 1.25 MG (50000 UT) PO CAPS: 50000.0000 [IU] | ORAL_CAPSULE | ORAL | Status: DC

## 2014-10-12 NOTE — Telephone Encounter (Signed)
The patient stated at her OV this week you were going to recheck on a meter that her insurance will cover.  Advise please

## 2014-10-13 NOTE — Telephone Encounter (Signed)
No she has to check on which meter her insurance covers. If she goes to her pharmacy they will be able to look up her plan and clairfy which glucometer they will cover and then we can send in RX

## 2014-10-13 NOTE — Telephone Encounter (Signed)
Pt notified. No questions at this time.

## 2014-10-22 NOTE — Assessment & Plan Note (Addendum)
hgba1c acceptable, minimize simple carbs. Increase exercise as tolerated.  Referred for opthamology evaluation

## 2014-10-22 NOTE — Progress Notes (Signed)
Subjective:    Patient ID: Jennifer Rich, female    DOB: 28-Nov-1965, 49 y.o.   MRN: [id number]  Chief Complaint  Patient presents with  . Annual Exam    HPI Patient is in today for annual exam. No recent illness. Continues to struggle with loose stool on Metformin. She stopped the Metformin and the diarrhea resolved. Is still struggling with some back pain. Denies CP/palp/SOB/HA/congestion/fevers/GI or GU c/o. Taking meds as prescribed  Past Medical History  Diagnosis Date  . Hypertension   . Chicken pox as a child  . Hyperlipidemia   . Vitamin D deficiency   . Thyroid disease   . Allergy   . Hyperglycemia   . Preventative health care 12/18/2012    Has seen Dr Carlota Raspberry for GYN in past  . Type II or unspecified type diabetes mellitus without mention of complication, not stated as uncontrolled   . Cervical cancer screening 01/25/2013  . Anxiety and depression 01/30/2013  . Goiter     Mildly enlarged thyroid TSH normal   . Obesity, unspecified 04/26/2013  . Pneumonia 05/02/2013  . 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 None    . Wisdom tooth extraction  49 yrs old    Family History  Problem Relation Age of Onset  . Heart attack Maternal Grandfather 75  . Heart disease Maternal Grandfather   . Diabetes Maternal Grandmother     type 2  . Blindness Maternal Grandmother   . Hypertension Paternal Grandmother   . Stroke Paternal Grandfather   . Hypertension Sister   . Hypertension Sister     Social History   Social History  . Marital Status: Married    Spouse Name: N/A  . Number of Children: 0  . Years of Education: N/A   Occupational History  . SALES [employer]    Social History Main Topics  . Smoking status: Never Smoker   . Smokeless tobacco: Never Used  . Alcohol Use: No  . Drug Use: No  . Sexual Activity:    Partners: Male    Patent examiner Protection: None   Comment: lives with husband and Limon, [name], no dietary restrictions.   Other Topics Concern  . Not on file   Social History Narrative   No Pork          Outpatient Prescriptions Prior to Visit  Medication Sig Dispense Refill  . ALPRAZolam (XANAX) 0.25 MG tablet Take 1 tablet (0.25 mg total) by mouth 2 (two) times daily as needed for anxiety. 20 tablet 1  . amLODipine (NORVASC) 5 MG tablet Take 1 tablet (5 mg total) by mouth daily. 30 tablet 3  . citalopram (CELEXA) 20 MG tablet Take 1 tablet (20 mg total) by mouth daily. 30 tablet 1  . losartan (COZAAR) 100 MG tablet TAKE 1 TABLET (100 MG TOTAL) BY MOUTH DAILY. 30 tablet 3  . metFORMIN (GLUCOPHAGE) 500 MG tablet Take 1 tablet (500 mg total) by mouth daily with breakfast. 30 tablet 3  . nebivolol (BYSTOLIC) 10 MG tablet Take 3 tablets (30 mg total) by mouth daily. 20 mg (2 tablets) in the am and 10 mg (1 tablet) in pm. 90 tablet 3  . triamterene-hydrochlorothiazide (MAXZIDE-25) 37.5-25 MG per tablet Take 1 tablet by mouth every morning. 30 tablet 3  . acetaminophen (TYLENOL) 500 MG tablet Take 500 mg by mouth every 6 (six) hours as needed.    Marland Kitchen  HYDROcodone-acetaminophen (NORCO/VICODIN) 5-325 MG per tablet Take 1-2 tablets by mouth every 6 (six) hours as needed. 12 tablet 0   No facility-administered medications prior to visit.    No Known Allergies  Review of Systems  Constitutional: Negative for fever, chills and malaise/fatigue.  HENT: Negative for congestion and hearing loss.   Eyes: Negative for discharge.  Respiratory: Negative for cough, sputum production and shortness of breath.   Cardiovascular: Negative for chest pain, palpitations and leg swelling.  Gastrointestinal: Negative for heartburn, nausea, vomiting, abdominal pain, diarrhea, constipation and blood in stool.  Genitourinary: Negative for dysuria, urgency, frequency and hematuria.  Musculoskeletal: Negative for myalgias, back pain and falls.  Skin: Negative  for rash.  Neurological: Negative for dizziness, sensory change, loss of consciousness, weakness and headaches.  Endo/Heme/Allergies: Negative for environmental allergies. Does not bruise/bleed easily.  Psychiatric/Behavioral: Negative for depression and suicidal ideas. The patient is not nervous/anxious and does not have insomnia.        Objective:    Physical Exam  Constitutional: She is oriented to person, place, and time. She appears well-developed and well-nourished. No distress.  HENT:  Head: Normocephalic and atraumatic.  Eyes: Conjunctivae are normal.  Neck: Neck supple. No thyromegaly present.  Cardiovascular: Normal rate, regular rhythm and normal heart sounds.   No murmur heard. Pulmonary/Chest: Effort normal and breath sounds normal. No respiratory distress.  Abdominal: Soft. Bowel sounds are normal. She exhibits no distension and no mass. There is no tenderness.  Musculoskeletal: She exhibits no edema.  Lymphadenopathy:    She has no cervical adenopathy.  Neurological: She is alert and oriented to person, place, and time.  Skin: Skin is warm and dry.  Psychiatric: She has a normal mood and affect. Her behavior is normal.    BP 160/97 mmHg  Pulse 74  Temp(Src) 97.7 F (36.5 C) (Oral)  Ht 5\' 7"  (1.702 m)  Wt 321 lb 6 oz (145.775 kg)  BMI 50.32 kg/m2  SpO2 97%  LMP 09/15/2014 Wt Readings from Last 3 Encounters:  10/10/14 321 lb 6 oz (145.775 kg)  09/29/14 315 lb (142.883 kg)  08/09/14 315 lb (142.883 kg)     Lab Results  Component Value Date   WBC 7.9 10/10/2014   HGB 13.2 10/10/2014   HCT 40.3 10/10/2014   PLT 192.0 10/10/2014   GLUCOSE 93 10/10/2014   CHOL 195 10/10/2014   TRIG 132.0 10/10/2014   HDL 41.40 10/10/2014   LDLCALC 128* 10/10/2014   ALT 7 10/10/2014   AST 16 10/10/2014   NA 137 10/10/2014   K 3.9 10/10/2014   CL 104 10/10/2014   CREATININE 0.83 10/10/2014   BUN 13 10/10/2014   CO2 25 10/10/2014   TSH 2.49 10/10/2014   HGBA1C 6.3  10/10/2014    Lab Results  Component Value Date   TSH 2.49 10/10/2014   Lab Results  Component Value Date   WBC 7.9 10/10/2014   HGB 13.2 10/10/2014   HCT 40.3 10/10/2014   MCV 76.8* 10/10/2014   PLT 192.0 10/10/2014   Lab Results  Component Value Date   NA 137 10/10/2014   K 3.9 10/10/2014   CO2 25 10/10/2014   GLUCOSE 93 10/10/2014   BUN 13 10/10/2014   CREATININE 0.83 10/10/2014   BILITOT 0.4 10/10/2014   ALKPHOS 72 10/10/2014   AST 16 10/10/2014   ALT 7 10/10/2014   PROT 7.5 10/10/2014   ALBUMIN 3.7 10/10/2014   CALCIUM 9.2 10/10/2014   GFR 94.13 10/10/2014  Lab Results  Component Value Date   CHOL 195 10/10/2014   Lab Results  Component Value Date   HDL 41.40 10/10/2014   Lab Results  Component Value Date   LDLCALC 128* 10/10/2014   Lab Results  Component Value Date   TRIG 132.0 10/10/2014   Lab Results  Component Value Date   CHOLHDL 5 10/10/2014   Lab Results  Component Value Date   HGBA1C 6.3 10/10/2014       Assessment & Plan:   Problem List Items Addressed This Visit    Vitamin D deficiency    Started on Vitamin D 50000 IU weekly and a daily OTC dose      Relevant Orders   Vitamin D (25 hydroxy) (Completed)   Preventative health care    Patient encouraged to maintain heart healthy diet, regular exercise, adequate sleep. Consider daily probiotics. Take medications as prescribed. Given and reviewed copy of ACP documents from [employer] and encouraged to complete and return      Obesity    Encouraged DASH diet, decrease po intake and increase exercise as tolerated. Needs 7-8 hours of sleep nightly. Avoid trans fats, eat small, frequent meals every 4-5 hours with lean proteins, complex carbs and healthy fats. Minimize simple carbs, GMO foods.      Relevant Medications   metFORMIN (GLUCOPHAGE-XR) 500 MG 24 hr tablet   Hyperlipidemia    Encouraged heart healthy diet, increase exercise, avoid trans fats, consider a krill oil  cap daily      Relevant Medications   amLODipine (NORVASC) 5 MG tablet   losartan (COZAAR) 100 MG tablet   nebivolol (BYSTOLIC) 10 MG tablet   triamterene-hydrochlorothiazide (MAXZIDE-25) 37.5-25 MG per tablet   Other Relevant Orders   Lipid panel (Completed)   Hyperglycemia    hgba1c acceptable, minimize simple carbs. Increase exercise as tolerated.       Relevant Orders   Hemoglobin A1c (Completed)   Ambulatory referral to Ophthalmology   Essential hypertension, benign    Well controlled, no changes to meds. Encouraged heart healthy diet such as the DASH diet and exercise as tolerated.       Relevant Medications   amLODipine (NORVASC) 5 MG tablet   losartan (COZAAR) 100 MG tablet   nebivolol (BYSTOLIC) 10 MG tablet   triamterene-hydrochlorothiazide (MAXZIDE-25) 37.5-25 MG per tablet   Other Relevant Orders   TSH (Completed)   CBC (Completed)   Comprehensive metabolic panel (Completed)    Other Visit Diagnoses    Essential hypertension    -  Primary    Relevant Medications    amLODipine (NORVASC) 5 MG tablet    losartan (COZAAR) 100 MG tablet    nebivolol (BYSTOLIC) 10 MG tablet    triamterene-hydrochlorothiazide (MAXZIDE-25) 37.5-25 MG per tablet    Decreased visual acuity        Relevant Orders    Ambulatory referral to Ophthalmology       I have discontinued Ms. Bokhari's HYDROcodone-acetaminophen. I am also having her maintain her acetaminophen, ALPRAZolam, amLODipine, citalopram, losartan, nebivolol, triamterene-hydrochlorothiazide, and metFORMIN.  Meds ordered this encounter  Medications  . amLODipine (NORVASC) 5 MG tablet    Sig: Take 1 tablet (5 mg total) by mouth daily.    Dispense:  30 tablet    Refill:  3  . citalopram (CELEXA) 20 MG tablet    Sig: Take 1 tablet (20 mg total) by mouth daily.    Dispense:  30 tablet    Refill:  1  .  losartan (COZAAR) 100 MG tablet    Sig: TAKE 1 TABLET (100 MG TOTAL) BY MOUTH DAILY.    Dispense:  30 tablet     Refill:  3  . nebivolol (BYSTOLIC) 10 MG tablet    Sig: Take 3 tablets (30 mg total) by mouth daily. 20 mg (2 tablets) in the am and 10 mg (1 tablet) in pm.    Dispense:  90 tablet    Refill:  3  . triamterene-hydrochlorothiazide (MAXZIDE-25) 37.5-25 MG per tablet    Sig: Take 1 tablet by mouth every morning.    Dispense:  30 tablet    Refill:  3  . DISCONTD: metFORMIN (GLUMETZA) 500 MG (MOD) 24 hr tablet    Sig: Take 1 tablet (500 mg total) by mouth daily with breakfast.    Dispense:  90 tablet    Refill:  1  . metFORMIN (GLUCOPHAGE-XR) 500 MG 24 hr tablet    Sig: Take 500 mg by mouth daily with breakfast.     Penni Homans, MD

## 2014-10-22 NOTE — Assessment & Plan Note (Signed)
Well controlled, no changes to meds. Encouraged heart healthy diet such as the DASH diet and exercise as tolerated.  °

## 2014-10-22 NOTE — Assessment & Plan Note (Signed)
Encouraged heart healthy diet, increase exercise, avoid trans fats, consider a krill oil cap daily 

## 2014-10-22 NOTE — Assessment & Plan Note (Signed)
Patient encouraged to maintain heart healthy diet, regular exercise, adequate sleep. Consider daily probiotics. Take medications as prescribed. Given and reviewed copy of ACP documents from [place] Secretary of State and encouraged to complete and return 

## 2014-10-22 NOTE — Assessment & Plan Note (Signed)
>>  ASSESSMENT AND PLAN FOR HYPERGLYCEMIA WRITTEN ON 10/22/2014  8:17 PM BY BLYTH, STACEY A, MD  hgba1c acceptable, minimize simple carbs. Increase exercise as tolerated.  Referred for opthamology evaluation

## 2014-10-22 NOTE — Assessment & Plan Note (Signed)
Encouraged DASH diet, decrease po intake and increase exercise as tolerated. Needs 7-8 hours of sleep nightly. Avoid trans fats, eat small, frequent meals every 4-5 hours with lean proteins, complex carbs and healthy fats. Minimize simple carbs, GMO foods. 

## 2014-10-22 NOTE — Assessment & Plan Note (Signed)
Improved when stopping Metformin. Will try switch to ER version.

## 2014-10-22 NOTE — Assessment & Plan Note (Signed)
Started on Vitamin D 50000 IU weekly and a daily OTC dose

## 2014-11-10 ENCOUNTER — Encounter: Payer: Self-pay | Admitting: Family Medicine

## 2014-11-22 ENCOUNTER — Other Ambulatory Visit: Payer: Self-pay | Admitting: Family Medicine

## 2014-11-29 ENCOUNTER — Other Ambulatory Visit: Payer: Self-pay | Admitting: Family Medicine

## 2014-11-29 ENCOUNTER — Encounter: Payer: Self-pay | Admitting: Family Medicine

## 2014-11-30 ENCOUNTER — Other Ambulatory Visit: Payer: Self-pay | Admitting: Family Medicine

## 2014-11-30 DIAGNOSIS — K625 Hemorrhage of anus and rectum: Secondary | ICD-10-CM

## 2014-11-30 NOTE — Telephone Encounter (Signed)
IFOB order entered at [facility]

## 2014-12-18 ENCOUNTER — Encounter (HOSPITAL_COMMUNITY): Payer: Self-pay | Admitting: Emergency Medicine

## 2014-12-18 ENCOUNTER — Encounter: Payer: Self-pay | Admitting: Family Medicine

## 2014-12-18 ENCOUNTER — Emergency Department (HOSPITAL_COMMUNITY)
Admission: EM | Admit: 2014-12-18 | Discharge: 2014-12-19 | Disposition: A | Discharge status: Home / Self Care | Payer: 59 | Attending: Emergency Medicine | Admitting: Emergency Medicine

## 2014-12-18 DIAGNOSIS — E669 Obesity, unspecified: Secondary | ICD-10-CM | POA: Insufficient documentation

## 2014-12-18 DIAGNOSIS — Z8701 Personal history of pneumonia (recurrent): Secondary | ICD-10-CM | POA: Insufficient documentation

## 2014-12-18 DIAGNOSIS — F419 Anxiety disorder, unspecified: Secondary | ICD-10-CM | POA: Diagnosis not present

## 2014-12-18 DIAGNOSIS — I10 Essential (primary) hypertension: Secondary | ICD-10-CM | POA: Diagnosis not present

## 2014-12-18 DIAGNOSIS — Y929 Unspecified place or not applicable: Secondary | ICD-10-CM | POA: Diagnosis not present

## 2014-12-18 DIAGNOSIS — E119 Type 2 diabetes mellitus without complications: Secondary | ICD-10-CM | POA: Diagnosis not present

## 2014-12-18 DIAGNOSIS — M549 Dorsalgia, unspecified: Secondary | ICD-10-CM | POA: Diagnosis present

## 2014-12-18 DIAGNOSIS — Z8619 Personal history of other infectious and parasitic diseases: Secondary | ICD-10-CM | POA: Insufficient documentation

## 2014-12-18 DIAGNOSIS — Y939 Activity, unspecified: Secondary | ICD-10-CM | POA: Diagnosis not present

## 2014-12-18 DIAGNOSIS — Z79899 Other long term (current) drug therapy: Secondary | ICD-10-CM | POA: Diagnosis not present

## 2014-12-18 DIAGNOSIS — Y999 Unspecified external cause status: Secondary | ICD-10-CM | POA: Insufficient documentation

## 2014-12-18 DIAGNOSIS — F329 Major depressive disorder, single episode, unspecified: Secondary | ICD-10-CM | POA: Diagnosis not present

## 2014-12-18 DIAGNOSIS — S39012A Strain of muscle, fascia and tendon of lower back, initial encounter: Secondary | ICD-10-CM | POA: Diagnosis not present

## 2014-12-18 DIAGNOSIS — X58XXXA Exposure to other specified factors, initial encounter: Secondary | ICD-10-CM | POA: Insufficient documentation

## 2014-12-18 HISTORY — DX: Obesity, unspecified: E66.9

## 2014-12-18 MED ORDER — HYDROCODONE-ACETAMINOPHEN 5-325 MG PO TABS: 1.0000 | ORAL_TABLET | Freq: Four times a day (QID) | ORAL | Status: DC | PRN

## 2014-12-18 MED ORDER — HYDROCODONE-ACETAMINOPHEN 5-325 MG PO TABS
1.0000 | ORAL_TABLET | Freq: Once | ORAL | Status: AC
  Administered 2014-12-19: 1 via ORAL
  Filled 2014-12-18: qty 1

## 2014-12-18 MED ORDER — IBUPROFEN 600 MG PO TABS: 600.0000 mg | ORAL_TABLET | Freq: Four times a day (QID) | ORAL | Status: DC | PRN

## 2014-12-18 MED ORDER — CYCLOBENZAPRINE HCL 10 MG PO TABS: 10.0000 mg | ORAL_TABLET | Freq: Three times a day (TID) | ORAL | Status: DC | PRN

## 2014-12-18 MED ORDER — KETOROLAC TROMETHAMINE 60 MG/2ML IM SOLN
60.0000 mg | Freq: Once | INTRAMUSCULAR | Status: AC
  Administered 2014-12-19: 60 mg via INTRAMUSCULAR
  Filled 2014-12-18: qty 2

## 2014-12-18 NOTE — ED Notes (Signed)
Pt. reports left low back pain onset 2 days ago after picking up her grandchild. Denies urinary discomfort or hematuria . Hypertensive at triage .

## 2014-12-18 NOTE — ED Provider Notes (Signed)
CSN: [id number]     Arrival date & time 12/18/14  1930 History   First MD Initiated Contact with Patient 12/18/14 2309     Chief Complaint  Patient presents with  . Back Pain     (Consider location/radiation/quality/duration/timing/severity/associated sxs/prior Treatment) HPI  49 year old female with a history of hypertension, obesity who presents with back pain. Patient reports that she noted left-sided back pain when she picked up her grandchild 2 days ago. Since that time she has had progressive back pain. It is worse with movement. She denies any difficulty with her bowel or bladder. She denies any weakness, numbness, tingling of the lower extreme these. She does report some exacerbation of pain with walking. It is over the left side. It is nonradiating. It is currently 9 out of 10. She is not taking anything for the pain. She denies any urinary symptoms.  Past Medical History  Diagnosis Date  . Hypertension   . Chicken pox as a child  . Hyperlipidemia   . Vitamin D deficiency   . Thyroid disease   . Allergy   . Hyperglycemia   . Preventative health care 12/18/2012    Has seen Dr Carlota Raspberry for GYN in past  . Type II or unspecified type diabetes mellitus without mention of complication, not stated as uncontrolled   . Cervical cancer screening 01/25/2013  . Anxiety and depression 01/30/2013  . Goiter     Mildly enlarged thyroid TSH normal   . Obesity, unspecified 04/26/2013  . Pneumonia 05/02/2013  . 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 Obesity    Past Surgical History  Procedure Laterality Date  . None    . Wisdom tooth extraction  49 yrs old   Family History  Problem Relation Age of Onset  . Heart attack Maternal Grandfather 75  . Heart disease Maternal Grandfather   . Diabetes Maternal Grandmother     type 2  . Blindness Maternal Grandmother   . Hypertension Paternal Grandmother   . Stroke Paternal Grandfather   . Hypertension Sister    . Hypertension Sister    Social History  Substance Use Topics  . Smoking status: Never Smoker   . Smokeless tobacco: Never Used  . Alcohol Use: No   OB History    No data available     Review of Systems  Constitutional: Negative for fever.  Genitourinary: Negative for difficulty urinating.  Musculoskeletal: Positive for back pain.  Neurological: Negative for weakness and numbness.  All other systems reviewed and are negative.     Allergies  Review of patient's allergies indicates no known allergies.  Home Medications   Prior to Admission medications   Medication Sig Start Date End Date Taking? Authorizing Provider  acetaminophen (TYLENOL) 500 MG tablet Take 500 mg by mouth every 6 (six) hours as needed for mild pain or headache.    Yes Historical Provider, MD  ALPRAZolam (XANAX) 0.25 MG tablet Take 1 tablet (0.25 mg total) by mouth 2 (two) times daily as needed for anxiety. 07/19/14  Yes Brunetta Jeans, PA-C  amLODipine (NORVASC) 5 MG tablet Take 1 tablet (5 mg total) by mouth daily. 10/10/14  Yes Mosie Lukes, MD  BYSTOLIC 10 MG tablet TAKE 2 TABLETS BY MOUTH EVERY DAY 11/22/14  Yes Mosie Lukes, MD  citalopram (CELEXA) 20 MG tablet Take 1 tablet (20 mg total) by mouth daily. 10/10/14  Yes Mosie Lukes, MD  ergocalciferol (VITAMIN D2) 50000 UNITS  capsule Take 1 capsule (50,000 Units total) by mouth once a week. 10/12/14  Yes Mosie Lukes, MD  losartan (COZAAR) 100 MG tablet TAKE 1 TABLET (100 MG TOTAL) BY MOUTH DAILY. 10/10/14  Yes Mosie Lukes, MD  metFORMIN (GLUCOPHAGE-XR) 500 MG 24 hr tablet Take 500 mg by mouth daily with breakfast.   Yes Historical Provider, MD  nebivolol (BYSTOLIC) 10 MG tablet Take 3 tablets (30 mg total) by mouth daily. 20 mg (2 tablets) in the am and 10 mg (1 tablet) in pm. 10/10/14  Yes Mosie Lukes, MD  triamterene-hydrochlorothiazide (MAXZIDE-25) 37.5-25 MG per tablet Take 1 tablet by mouth every morning. 10/10/14  Yes Mosie Lukes, MD   cyclobenzaprine (FLEXERIL) 10 MG tablet Take 1 tablet (10 mg total) by mouth 3 (three) times daily as needed for muscle spasms. 12/18/14   Merryl Hacker, MD  HYDROcodone-acetaminophen (NORCO/VICODIN) 5-325 MG tablet Take 1 tablet by mouth every 6 (six) hours as needed. 12/18/14   Merryl Hacker, MD  ibuprofen (ADVIL,MOTRIN) 600 MG tablet Take 1 tablet (600 mg total) by mouth every 6 (six) hours as needed. 12/18/14   Merryl Hacker, MD   BP 121/65 mmHg  Pulse 74  Temp(Src) 98.8 F (37.1 C) (Oral)  Resp 16  Ht 5\' 8"  (1.727 m)  Wt 320 lb (145.151 kg)  BMI 48.67 kg/m2  SpO2 98%  LMP 11/21/2014 Physical Exam  Constitutional: She is oriented to person, place, and time. She appears well-developed and well-nourished. No distress.  Obese  HENT:  Head: Normocephalic and atraumatic.  Cardiovascular: Normal rate, regular rhythm and normal heart sounds.   No murmur heard. Pulmonary/Chest: Effort normal and breath sounds normal. No respiratory distress. She has no wheezes.  Abdominal: Soft. There is no tenderness.  Musculoskeletal:  Tenderness to palpation over the left paraspinous muscle region, no midline tenderness, step-off, or deformity  Neurological: She is alert and oriented to person, place, and time.  5 out of 5 strength bilateral lower extremities, no clonus, normal reflexes bilaterally  Skin: Skin is warm and dry.  Psychiatric: She has a normal mood and affect.  Nursing note and vitals reviewed.   ED Course  Procedures (including critical care time) Labs Review Labs Reviewed - No data to display  Imaging Review No results found. I have personally reviewed and evaluated these images and lab results as part of my medical decision-making.   EKG Interpretation None      MDM   Final diagnoses:  Lumbosacral strain, initial encounter    Patient presents with back pain. He reports onset of pain after picking up her grandchild.  Musculoskeletal in nature.  No  midline tenderness.  No signs or symptoms of cauda equina.  No indication for imaging at this time.  Will d/c with anti-inflammatories and muscle relaxers.    After history, exam, and medical workup I feel the patient has been appropriately medically screened and is safe for discharge home. Pertinent diagnoses were discussed with the patient. Patient was given return precautions.     Merryl Hacker, MD 12/19/14 0001

## 2014-12-18 NOTE — Discharge Instructions (Signed)

## 2014-12-19 NOTE — ED Notes (Signed)
Pt. Left with all belongings and refused wheelchair. Discharge instructions were reviewed and all questions were answered.  

## 2015-01-03 ENCOUNTER — Emergency Department (HOSPITAL_COMMUNITY)
Admission: EM | Admit: 2015-01-03 | Discharge: 2015-01-03 | Disposition: A | Discharge status: Home / Self Care | Payer: 59 | Attending: Emergency Medicine | Admitting: Emergency Medicine

## 2015-01-03 ENCOUNTER — Emergency Department (HOSPITAL_COMMUNITY): Payer: 59

## 2015-01-03 ENCOUNTER — Encounter (HOSPITAL_COMMUNITY): Payer: Self-pay | Admitting: Family Medicine

## 2015-01-03 DIAGNOSIS — R0789 Other chest pain: Secondary | ICD-10-CM

## 2015-01-03 DIAGNOSIS — Z8619 Personal history of other infectious and parasitic diseases: Secondary | ICD-10-CM | POA: Insufficient documentation

## 2015-01-03 DIAGNOSIS — M79602 Pain in left arm: Secondary | ICD-10-CM | POA: Diagnosis not present

## 2015-01-03 DIAGNOSIS — R079 Chest pain, unspecified: Secondary | ICD-10-CM | POA: Diagnosis present

## 2015-01-03 DIAGNOSIS — R200 Anesthesia of skin: Secondary | ICD-10-CM | POA: Diagnosis not present

## 2015-01-03 DIAGNOSIS — E669 Obesity, unspecified: Secondary | ICD-10-CM | POA: Insufficient documentation

## 2015-01-03 DIAGNOSIS — F329 Major depressive disorder, single episode, unspecified: Secondary | ICD-10-CM | POA: Diagnosis not present

## 2015-01-03 DIAGNOSIS — R109 Unspecified abdominal pain: Secondary | ICD-10-CM

## 2015-01-03 DIAGNOSIS — Z79899 Other long term (current) drug therapy: Secondary | ICD-10-CM | POA: Insufficient documentation

## 2015-01-03 DIAGNOSIS — I10 Essential (primary) hypertension: Secondary | ICD-10-CM | POA: Diagnosis not present

## 2015-01-03 DIAGNOSIS — M79605 Pain in left leg: Secondary | ICD-10-CM | POA: Insufficient documentation

## 2015-01-03 DIAGNOSIS — E1165 Type 2 diabetes mellitus with hyperglycemia: Secondary | ICD-10-CM | POA: Diagnosis not present

## 2015-01-03 DIAGNOSIS — F419 Anxiety disorder, unspecified: Secondary | ICD-10-CM | POA: Diagnosis not present

## 2015-01-03 DIAGNOSIS — Z8701 Personal history of pneumonia (recurrent): Secondary | ICD-10-CM | POA: Insufficient documentation

## 2015-01-03 DIAGNOSIS — Z7984 Long term (current) use of oral hypoglycemic drugs: Secondary | ICD-10-CM | POA: Insufficient documentation

## 2015-01-03 DIAGNOSIS — E559 Vitamin D deficiency, unspecified: Secondary | ICD-10-CM | POA: Insufficient documentation

## 2015-01-03 LAB — BASIC METABOLIC PANEL
ANION GAP: 9 (ref 5–15)
BUN: 9 mg/dL (ref 6–20)
CHLORIDE: 104 mmol/L (ref 101–111)
CO2: 26 mmol/L (ref 22–32)
Calcium: 8.9 mg/dL (ref 8.9–10.3)
Creatinine, Ser: 0.98 mg/dL (ref 0.44–1.00)
GFR calc Af Amer: >60 mL/min (ref >60)
GFR calc non Af Amer: >60 mL/min (ref >60)
Glucose, Bld: 115 mg/dL — ABNORMAL HIGH (ref 65–99)
POTASSIUM: 3.5 mmol/L (ref 3.5–5.1)
Sodium: 139 mmol/L (ref 135–145)

## 2015-01-03 LAB — CBC
HCT: 39.9 % (ref 36.0–46.0)
HEMOGLOBIN: 13 g/dL (ref 12.0–15.0)
MCH: 25.3 pg — ABNORMAL LOW (ref 26.0–34.0)
MCHC: 32.6 g/dL (ref 30.0–36.0)
MCV: 77.6 fL — AB (ref 78.0–100.0)
Platelets: 243 10*3/uL (ref 150–400)
RBC: 5.14 MIL/uL — AB (ref 3.87–5.11)
RDW: 14.7 % (ref 11.5–15.5)
WBC: 5.3 10*3/uL (ref 4.0–10.5)

## 2015-01-03 LAB — I-STAT TROPONIN, ED: Troponin i, poc: 0 ng/mL (ref 0.00–0.08)

## 2015-01-03 LAB — D-DIMER, QUANTITATIVE (NOT AT ARMC): D DIMER QUANT: 0.35 ug{FEU}/mL (ref 0.00–0.48)

## 2015-01-03 MED ORDER — ACETAMINOPHEN 325 MG PO TABS
650.0000 mg | ORAL_TABLET | Freq: Once | ORAL | Status: AC
  Administered 2015-01-03: 650 mg via ORAL
  Filled 2015-01-03: qty 2

## 2015-01-03 NOTE — Progress Notes (Signed)
LCSW met with patient per request of RN regarding referrals for depression/anxiety. Patient reports one previous admission to inpatient acute setting years ago. Reports no current outpatient other than PCP.  PCP is perscribing her psychotropic medications.  Patient agreeable for referral for outpatient therapy and medication management. LCSW discussed list of resources and completed referral for Neuropsychiatric.  Patient to follow up with appointment depending on her schedule.  No other needs voiced.    Lane Hacker, MSW Clinical Social Work: Emergency Room [phone]

## 2015-01-03 NOTE — ED Provider Notes (Signed)
CSN: [id number]     Arrival date & time 01/03/15  1117 History   First MD Initiated Contact with Patient 01/03/15 1124     Chief Complaint  Patient presents with  . Chest Pain  . Numbness     (Consider location/radiation/quality/duration/timing/severity/associated sxs/prior Treatment) Patient is a 49 y.o. female presenting with chest pain. The history is provided by the patient.  Chest Pain Associated symptoms: numbness   Associated symptoms: no abdominal pain, no back pain, no headache, no nausea, no shortness of breath and not vomiting    patient presents with chest pain. Has had some left lower chest pain. It is dull. Worse with movements. No abdominal pain. No difficulty breathing. Somewhat worse with deep breaths. No trauma. She's not had pains like this before. Reportedly has history of early coronary artery disease in the family. Also complaining of pain in her left arm and left leg. It is dull. No headache. No confusion. No fevers. States that she did develop some difficulty speaking earlier today. It is well the pain was severe. No weakness with the left sided pain.  Past Medical History  Diagnosis Date  . Hypertension   . Chicken pox as a child  . Hyperlipidemia   . Vitamin D deficiency   . Thyroid disease   . Allergy   . Hyperglycemia   . Preventative health care 12/18/2012    Has seen Dr Carlota Raspberry for GYN in past  . Type II or unspecified type diabetes mellitus without mention of complication, not stated as uncontrolled   . Cervical cancer screening 01/25/2013  . Anxiety and depression 01/30/2013  . Goiter     Mildly enlarged thyroid TSH normal   . Obesity, unspecified 04/26/2013  . Pneumonia 05/02/2013  . 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 Obesity    Past Surgical History  Procedure Laterality Date  . None    . Wisdom tooth extraction  49 yrs old   Family History  Problem Relation Age of Onset  . Heart attack Maternal Grandfather  75  . Heart disease Maternal Grandfather   . Diabetes Maternal Grandmother     type 2  . Blindness Maternal Grandmother   . Hypertension Paternal Grandmother   . Stroke Paternal Grandfather   . Hypertension Sister   . Hypertension Sister    Social History  Substance Use Topics  . Smoking status: Never Smoker   . Smokeless tobacco: Never Used  . Alcohol Use: No   OB History    No data available     Review of Systems  Constitutional: Negative for activity change and appetite change.  Eyes: Negative for pain.  Respiratory: Negative for chest tightness and shortness of breath.   Cardiovascular: Positive for chest pain. Negative for leg swelling.  Gastrointestinal: Negative for nausea, vomiting, abdominal pain and diarrhea.  Genitourinary: Negative for flank pain.  Musculoskeletal: Negative for back pain and neck stiffness.  Skin: Negative for rash.  Neurological: Positive for numbness. Negative for headaches.  Psychiatric/Behavioral: Negative for behavioral problems.      Allergies  Review of patient's allergies indicates no known allergies.  Home Medications   Prior to Admission medications   Medication Sig Start Date End Date Taking? Authorizing Provider  acetaminophen (TYLENOL) 500 MG tablet Take 500 mg by mouth every 6 (six) hours as needed for mild pain or headache.    Yes Historical Provider, MD  ALPRAZolam (XANAX) 0.25 MG tablet Take 1 tablet (0.25 mg  total) by mouth 2 (two) times daily as needed for anxiety. 07/19/14  Yes Brunetta Jeans, PA-C  amLODipine (NORVASC) 5 MG tablet Take 1 tablet (5 mg total) by mouth daily. 10/10/14  Yes Mosie Lukes, MD  BYSTOLIC 10 MG tablet TAKE 2 TABLETS BY MOUTH EVERY DAY 11/22/14  Yes Mosie Lukes, MD  cyclobenzaprine (FLEXERIL) 10 MG tablet Take 1 tablet (10 mg total) by mouth 3 (three) times daily as needed for muscle spasms. 12/18/14  Yes Merryl Hacker, MD  ergocalciferol (VITAMIN D2) 50000 UNITS capsule Take 1 capsule  (50,000 Units total) by mouth once a week. 10/12/14  Yes Mosie Lukes, MD  ibuprofen (ADVIL,MOTRIN) 600 MG tablet Take 1 tablet (600 mg total) by mouth every 6 (six) hours as needed. Patient taking differently: Take 600 mg by mouth every 6 (six) hours as needed (pain).  12/18/14  Yes Merryl Hacker, MD  losartan (COZAAR) 100 MG tablet TAKE 1 TABLET (100 MG TOTAL) BY MOUTH DAILY. 10/10/14  Yes Mosie Lukes, MD  metFORMIN (GLUCOPHAGE-XR) 500 MG 24 hr tablet Take 500 mg by mouth daily with breakfast.   Yes Historical Provider, MD  triamterene-hydrochlorothiazide (MAXZIDE-25) 37.5-25 MG per tablet Take 1 tablet by mouth every morning. 10/10/14  Yes Mosie Lukes, MD  citalopram (CELEXA) 20 MG tablet Take 1 tablet (20 mg total) by mouth daily. Patient not taking: Reported on 01/03/2015 10/10/14   Mosie Lukes, MD   BP 170/107 mmHg  Pulse 82  Temp(Src) 98 F (36.7 C) (Oral)  Resp 19  SpO2 99%  LMP 11/21/2014 Physical Exam  Constitutional: She appears well-developed and well-nourished.  HENT:  Head: Atraumatic.  Neck: Neck supple.  Cardiovascular: Normal rate.   Pulmonary/Chest: Effort normal.  Abdominal: Soft. Bowel sounds are normal.  Musculoskeletal: Normal range of motion. She exhibits no tenderness.  Neurological: She is alert.  Good dressing bilaterally. Sensation intact in bilateral upper and lower extremities. External ocular movements intact. Normal speech.  Skin: Skin is warm.  Psychiatric: She has a normal mood and affect.    ED Course  Procedures (including critical care time) Labs Review Labs Reviewed  BASIC METABOLIC PANEL - Abnormal; Notable for the following:    Glucose, Bld 115 (*)    All other components within normal limits  CBC - Abnormal; Notable for the following:    RBC 5.14 (*)    MCV 77.6 (*)    MCH 25.3 (*)    All other components within normal limits  D-DIMER, QUANTITATIVE (NOT AT [facility])  Randolm Idol, ED    Imaging Review Dg Chest 2  View  01/03/2015  CLINICAL DATA:  Chest pain.  Shortness of breath. EXAM: CHEST  2 VIEW COMPARISON:  04/10/2014 and and 11/08/2013 FINDINGS: Chronic slight cardiomegaly. Pulmonary vascularity is at the upper limits are normal. Lungs are clear. No effusions. No osseous abnormality. IMPRESSION: No acute abnormality.  Chronic cardiomegaly. Electronically Signed   By: Lorriane Shire M.D.   On: 01/03/2015 12:09   Ct Head Wo Contrast  01/03/2015  CLINICAL DATA:  49 year old female with history of chest pain for 1 week with associated left arm and left leg numbness. Diaphoresis. Tongue numbness. EXAM: CT HEAD WITHOUT CONTRAST TECHNIQUE: Contiguous axial images were obtained from the base of the skull through the vertex without intravenous contrast. COMPARISON:  Head CT 11/20/2008. FINDINGS: No acute intracranial abnormalities. Specifically, no evidence of acute intracranial hemorrhage, no definite findings of acute/subacute cerebral ischemia, no mass, mass  effect, hydrocephalus or abnormal intra or extra-axial fluid collections. Visualized paranasal sinuses and mastoids are well pneumatized. No acute displaced skull fractures are identified. IMPRESSION: *No acute intracranial abnormalities. *The appearance of the brain is normal. Electronically Signed   By: Vinnie Langton M.D.   On: 01/03/2015 12:50   I have personally reviewed and evaluated these images and lab results as part of my medical decision-making.   EKG Interpretation   Date/Time:  Wednesday January 03 2015 11:19:22 EDT Ventricular Rate:  98 PR Interval:  166 QRS Duration: 76 QT Interval:  384 QTC Calculation: 490 R Axis:   83 Text Interpretation:  Normal sinus rhythm Cannot rule out Anterior infarct  , age undetermined Abnormal ECG Confirmed by Leaira Fullam  MD, Toneshia Coello [phone])  on 01/03/2015 11:26:08 AM      MDM   Final diagnoses:  Atypical chest pain  Left sided abdominal pain    Patient with chest pain and numbness. EKG and lab  work reassuring. Head CT reassuring. Doubt cardiac cause or CVA as the cause of this. Likely anxiety component. Patient asked for some therapist follow-up and she was to be seen by social work for resources.    Davonna Belling, MD 01/03/15 [phone]

## 2015-01-03 NOTE — ED Notes (Signed)
Pt is waiting for social work

## 2015-01-03 NOTE — ED Notes (Signed)
Pt here for chest pain x 1 week. sts worsening today with left arm and leg numbness. sts diaphoretic and tongue is numb.

## 2015-01-03 NOTE — Discharge Instructions (Signed)
Nonspecific Chest Pain  °Chest pain can be caused by many different conditions. There is always a chance that your pain could be related to something serious, such as a heart attack or a blood clot in your lungs. Chest pain can also be caused by conditions that are not life-threatening. If you have chest pain, it is very important to follow up with your health care provider. °CAUSES  °Chest pain can be caused by: °· Heartburn. °· Pneumonia or bronchitis. °· Anxiety or stress. °· Inflammation around your heart (pericarditis) or lung (pleuritis or pleurisy). °· A blood clot in your lung. °· A collapsed lung (pneumothorax). It can develop suddenly on its own (spontaneous pneumothorax) or from trauma to the chest. °· Shingles infection (varicella-zoster virus). °· Heart attack. °· Damage to the bones, muscles, and cartilage that make up your chest wall. This can include: °¨ Bruised bones due to injury. °¨ Strained muscles or cartilage due to frequent or repeated coughing or overwork. °¨ Fracture to one or more ribs. °¨ Sore cartilage due to inflammation (costochondritis). °RISK FACTORS  °Risk factors for chest pain may include: °· Activities that increase your risk for trauma or injury to your chest. °· Respiratory infections or conditions that cause frequent coughing. °· Medical conditions or overeating that can cause heartburn. °· Heart disease or family history of heart disease. °· Conditions or health behaviors that increase your risk of developing a blood clot. °· Having had chicken pox (varicella zoster). °SIGNS AND SYMPTOMS °Chest pain can feel like: °· Burning or tingling on the surface of your chest or deep in your chest. °· Crushing, pressure, aching, or squeezing pain. °· Dull or sharp pain that is worse when you move, cough, or take a deep breath. °· Pain that is also felt in your back, neck, shoulder, or arm, or pain that spreads to any of these areas. °Your chest pain may come and go, or it may stay  constant. °DIAGNOSIS °Lab tests or other studies may be needed to find the cause of your pain. Your health care provider may have you take a test called an ambulatory ECG (electrocardiogram). An ECG records your heartbeat patterns at the time the test is performed. You may also have other tests, such as: °· Transthoracic echocardiogram (TTE). During echocardiography, sound waves are used to create a picture of all of the heart structures and to look at how blood flows through your heart. °· Transesophageal echocardiogram (TEE). This is a more advanced imaging test that obtains images from inside your body. It allows your health care provider to see your heart in finer detail. °· Cardiac monitoring. This allows your health care provider to monitor your heart rate and rhythm in real time. °· Holter monitor. This is a portable device that records your heartbeat and can help to diagnose abnormal heartbeats. It allows your health care provider to track your heart activity for several days, if needed. °· Stress tests. These can be done through exercise or by taking medicine that makes your heart beat more quickly. °· Blood tests. °· Imaging tests. °TREATMENT  °Your treatment depends on what is causing your chest pain. Treatment may include: °· Medicines. These may include: °¨ Acid blockers for heartburn. °¨ Anti-inflammatory medicine. °¨ Pain medicine for inflammatory conditions. °¨ Antibiotic medicine, if an infection is present. °¨ Medicines to dissolve blood clots. °¨ Medicines to treat coronary artery disease. °· Supportive care for conditions that do not require medicines. This may include: °¨ Resting. °¨ Applying heat   or cold packs to injured areas. °¨ Limiting activities until pain decreases. °HOME CARE INSTRUCTIONS °· If you were prescribed an antibiotic medicine, finish it all even if you start to feel better. °· Avoid any activities that bring on chest pain. °· Do not use any tobacco products, including  cigarettes, chewing tobacco, or electronic cigarettes. If you need help quitting, ask your health care provider. °· Do not drink alcohol. °· Take medicines only as directed by your health care provider. °· Keep all follow-up visits as directed by your health care provider. This is important. This includes any further testing if your chest pain does not go away. °· If heartburn is the cause for your chest pain, you may be told to keep your head raised (elevated) while sleeping. This reduces the chance that acid will go from your stomach into your esophagus. °· Make lifestyle changes as directed by your health care provider. These may include: °¨ Getting regular exercise. Ask your health care provider to suggest some activities that are safe for you. °¨ Eating a heart-healthy diet. A registered dietitian can help you to learn healthy eating options. °¨ Maintaining a healthy weight. °¨ Managing diabetes, if necessary. °¨ Reducing stress. °SEEK MEDICAL CARE IF: °· Your chest pain does not go away after treatment. °· You have a rash with blisters on your chest. °· You have a fever. °SEEK IMMEDIATE MEDICAL CARE IF:  °· Your chest pain is worse. °· You have an increasing cough, or you cough up blood. °· You have severe abdominal pain. °· You have severe weakness. °· You faint. °· You have chills. °· You have sudden, unexplained chest discomfort. °· You have sudden, unexplained discomfort in your arms, back, neck, or jaw. °· You have shortness of breath at any time. °· You suddenly start to sweat, or your skin gets clammy. °· You feel nauseous or you vomit. °· You suddenly feel light-headed or dizzy. °· Your heart begins to beat quickly, or it feels like it is skipping beats. °These symptoms may represent a serious problem that is an emergency. Do not wait to see if the symptoms will go away. Get medical help right away. Call your local emergency services (911 in the [place] Do not drive yourself to the hospital. °  °This  information is not intended to replace advice given to you by your health care provider. Make sure you discuss any questions you have with your health care provider. °  °Document Released: 11/27/2004 Document Revised: 03/10/2014 Document Reviewed: 09/23/2013 °Elsevier Interactive Patient Education ©2016 [employer] ° °

## 2015-01-17 ENCOUNTER — Ambulatory Visit: Payer: Self-pay | Admitting: Physician Assistant

## 2015-01-18 ENCOUNTER — Telehealth: Payer: Self-pay | Admitting: Family Medicine

## 2015-01-18 ENCOUNTER — Ambulatory Visit: Payer: 59 | Admitting: Family Medicine

## 2015-01-18 NOTE — Telephone Encounter (Signed)
No charger 

## 2015-01-18 NOTE — Telephone Encounter (Signed)
Pt left voicemail 11/17 8:51am to cancel appt for today at 3:00pm, no reason given, charge or no charge?

## 2015-01-30 ENCOUNTER — Telehealth: Payer: Self-pay | Admitting: Family Medicine

## 2015-01-30 NOTE — Telephone Encounter (Signed)
Called the patient left a detailed message to call back with specific month/date flu shot given so can document in chart more accurately.

## 2015-01-30 NOTE — Telephone Encounter (Signed)
Called patient to schedule Flu Shot on 11/29. Patient declined because she had her Flu Shot at work.

## 2015-02-08 ENCOUNTER — Ambulatory Visit (INDEPENDENT_AMBULATORY_CARE_PROVIDER_SITE_OTHER): Payer: 59 | Admitting: Family Medicine

## 2015-02-08 DIAGNOSIS — I10 Essential (primary) hypertension: Secondary | ICD-10-CM

## 2015-02-08 NOTE — Progress Notes (Deleted)
Pre visit review using our clinic review tool, if applicable. No additional management support is needed unless otherwise documented below in the visit note. 

## 2015-02-09 NOTE — Progress Notes (Deleted)
Help Dr. Charlett Blake

## 2015-02-11 NOTE — Progress Notes (Signed)
Patient did not come to appointment.

## 2015-03-19 ENCOUNTER — Emergency Department (HOSPITAL_BASED_OUTPATIENT_CLINIC_OR_DEPARTMENT_OTHER): Payer: 59

## 2015-03-19 ENCOUNTER — Emergency Department (HOSPITAL_BASED_OUTPATIENT_CLINIC_OR_DEPARTMENT_OTHER)
Admission: EM | Admit: 2015-03-19 | Discharge: 2015-03-19 | Disposition: A | Discharge status: Home / Self Care | Payer: 59 | Attending: Emergency Medicine | Admitting: Emergency Medicine

## 2015-03-19 ENCOUNTER — Encounter (HOSPITAL_BASED_OUTPATIENT_CLINIC_OR_DEPARTMENT_OTHER): Payer: Self-pay | Admitting: *Deleted

## 2015-03-19 DIAGNOSIS — Z79899 Other long term (current) drug therapy: Secondary | ICD-10-CM | POA: Diagnosis not present

## 2015-03-19 DIAGNOSIS — Y9301 Activity, walking, marching and hiking: Secondary | ICD-10-CM | POA: Diagnosis not present

## 2015-03-19 DIAGNOSIS — F329 Major depressive disorder, single episode, unspecified: Secondary | ICD-10-CM | POA: Diagnosis not present

## 2015-03-19 DIAGNOSIS — Z8701 Personal history of pneumonia (recurrent): Secondary | ICD-10-CM | POA: Diagnosis not present

## 2015-03-19 DIAGNOSIS — E119 Type 2 diabetes mellitus without complications: Secondary | ICD-10-CM | POA: Insufficient documentation

## 2015-03-19 DIAGNOSIS — Z7984 Long term (current) use of oral hypoglycemic drugs: Secondary | ICD-10-CM | POA: Diagnosis not present

## 2015-03-19 DIAGNOSIS — E669 Obesity, unspecified: Secondary | ICD-10-CM | POA: Insufficient documentation

## 2015-03-19 DIAGNOSIS — Z8619 Personal history of other infectious and parasitic diseases: Secondary | ICD-10-CM | POA: Insufficient documentation

## 2015-03-19 DIAGNOSIS — F419 Anxiety disorder, unspecified: Secondary | ICD-10-CM | POA: Insufficient documentation

## 2015-03-19 DIAGNOSIS — E559 Vitamin D deficiency, unspecified: Secondary | ICD-10-CM | POA: Diagnosis not present

## 2015-03-19 DIAGNOSIS — I10 Essential (primary) hypertension: Secondary | ICD-10-CM | POA: Insufficient documentation

## 2015-03-19 DIAGNOSIS — Y9289 Other specified places as the place of occurrence of the external cause: Secondary | ICD-10-CM | POA: Diagnosis not present

## 2015-03-19 DIAGNOSIS — S93402A Sprain of unspecified ligament of left ankle, initial encounter: Secondary | ICD-10-CM | POA: Insufficient documentation

## 2015-03-19 DIAGNOSIS — S99912A Unspecified injury of left ankle, initial encounter: Secondary | ICD-10-CM | POA: Diagnosis present

## 2015-03-19 DIAGNOSIS — X501XXA Overexertion from prolonged static or awkward postures, initial encounter: Secondary | ICD-10-CM | POA: Diagnosis not present

## 2015-03-19 DIAGNOSIS — Y998 Other external cause status: Secondary | ICD-10-CM | POA: Diagnosis not present

## 2015-03-19 DIAGNOSIS — E785 Hyperlipidemia, unspecified: Secondary | ICD-10-CM | POA: Diagnosis not present

## 2015-03-19 NOTE — Discharge Instructions (Signed)
Ankle Sprain °An ankle sprain is an injury to the strong, fibrous tissues (ligaments) that hold the bones of your ankle joint together.  °CAUSES °An ankle sprain is usually caused by a fall or by twisting your ankle. Ankle sprains most commonly occur when you step on the outer edge of your foot, and your ankle turns inward. People who participate in sports are more prone to these types of injuries.  °SYMPTOMS  °· Pain in your ankle. The pain may be present at rest or only when you are trying to stand or walk. °· Swelling. °· Bruising. Bruising may develop immediately or within 1 to 2 days after your injury. °· Difficulty standing or walking, particularly when turning corners or changing directions. °DIAGNOSIS  °Your caregiver will ask you details about your injury and perform a physical exam of your ankle to determine if you have an ankle sprain. During the physical exam, your caregiver will press on and apply pressure to specific areas of your foot and ankle. Your caregiver will try to move your ankle in certain ways. An X-ray exam may be done to be sure a bone was not broken or a ligament did not separate from one of the bones in your ankle (avulsion fracture).  °TREATMENT  °Certain types of braces can help stabilize your ankle. Your caregiver can make a recommendation for this. Your caregiver may recommend the use of medicine for pain. If your sprain is severe, your caregiver may refer you to a surgeon who helps to restore function to parts of your skeletal system (orthopedist) or a physical therapist. °HOME CARE INSTRUCTIONS  °· Apply ice to your injury for 1-2 days or as directed by your caregiver. Applying ice helps to reduce inflammation and pain. °¨ Put ice in a plastic bag. °¨ Place a towel between your skin and the bag. °¨ Leave the ice on for 15-20 minutes at a time, every 2 hours while you are awake. °· Only take over-the-counter or prescription medicines for pain, discomfort, or fever as directed by  your caregiver. °· Elevate your injured ankle above the level of your heart as much as possible for 2-3 days. °· If your caregiver recommends crutches, use them as instructed. Gradually put weight on the affected ankle. Continue to use crutches or a cane until you can walk without feeling pain in your ankle. °· If you have a plaster splint, wear the splint as directed by your caregiver. Do not rest it on anything harder than a pillow for the first 24 hours. Do not put weight on it. Do not get it wet. You may take it off to take a shower or bath. °· You may have been given an elastic bandage to wear around your ankle to provide support. If the elastic bandage is too tight (you have numbness or tingling in your foot or your foot becomes cold and blue), adjust the bandage to make it comfortable. °· If you have an air splint, you may blow more air into it or let air out to make it more comfortable. You may take your splint off at night and before taking a shower or bath. Wiggle your toes in the splint several times per day to decrease swelling. °SEEK MEDICAL CARE IF:  °· You have rapidly increasing bruising or swelling. °· Your toes feel extremely cold or you lose feeling in your foot. °· Your pain is not relieved with medicine. °SEEK IMMEDIATE MEDICAL CARE IF: °· Your toes are numb or blue. °·   You have severe pain that is increasing. MAKE SURE YOU:   Understand these instructions.  Will watch your condition.  Will get help right away if you are not doing well or get worse.   This information is not intended to replace advice given to you by your health care provider. Make sure you discuss any questions you have with your health care provider.   Document Released: 02/17/2005 Document Revised: 03/10/2014 Document Reviewed: 03/01/2011 Elsevier Interactive Patient Education 2016 Elsevier Inc[employer]  Ankle Sprain An ankle sprain is an injury to the strong, fibrous tissues (ligaments) that hold the bones of your  ankle joint together.  CAUSES An ankle sprain is usually caused by a fall or by twisting your ankle. Ankle sprains most commonly occur when you step on the outer edge of your foot, and your ankle turns inward. People who participate in sports are more prone to these types of injuries.  SYMPTOMS   Pain in your ankle. The pain may be present at rest or only when you are trying to stand or walk.  Swelling.  Bruising. Bruising may develop immediately or within 1 to 2 days after your injury.  Difficulty standing or walking, particularly when turning corners or changing directions. DIAGNOSIS  Your caregiver will ask you details about your injury and perform a physical exam of your ankle to determine if you have an ankle sprain. During the physical exam, your caregiver will press on and apply pressure to specific areas of your foot and ankle. Your caregiver will try to move your ankle in certain ways. An X-ray exam may be done to be sure a bone was not broken or a ligament did not separate from one of the bones in your ankle (avulsion fracture).  TREATMENT  Certain types of braces can help stabilize your ankle. Your caregiver can make a recommendation for this. Your caregiver may recommend the use of medicine for pain. If your sprain is severe, your caregiver may refer you to a surgeon who helps to restore function to parts of your skeletal system (orthopedist) or a physical therapist. [place] ice to your injury for 1-2 days or as directed by your caregiver. Applying ice helps to reduce inflammation and pain.  Put ice in a plastic bag.  Place a towel between your skin and the bag.  Leave the ice on for 15-20 minutes at a time, every 2 hours while you are awake.  Only take over-the-counter or prescription medicines for pain, discomfort, or fever as directed by your caregiver.  Elevate your injured ankle above the level of your heart as much as possible for 2-3 days.  If  your caregiver recommends crutches, use them as instructed. Gradually put weight on the affected ankle. Continue to use crutches or a cane until you can walk without feeling pain in your ankle.  If you have a plaster splint, wear the splint as directed by your caregiver. Do not rest it on anything harder than a pillow for the first 24 hours. Do not put weight on it. Do not get it wet. You may take it off to take a shower or bath.  You may have been given an elastic bandage to wear around your ankle to provide support. If the elastic bandage is too tight (you have numbness or tingling in your foot or your foot becomes cold and blue), adjust the bandage to make it comfortable.  If you have an air splint, you may blow more  air into it or let air out to make it more comfortable. You may take your splint off at night and before taking a shower or bath. Wiggle your toes in the splint several times per day to decrease swelling. SEEK MEDICAL CARE IF:   You have rapidly increasing bruising or swelling.  Your toes feel extremely cold or you lose feeling in your foot.  Your pain is not relieved with medicine. SEEK IMMEDIATE MEDICAL CARE IF:  Your toes are numb or blue.  You have severe pain that is increasing. MAKE SURE YOU:   Understand these instructions.  Will watch your condition.  Will get help right away if you are not doing well or get worse.   This information is not intended to replace advice given to you by your health care provider. Make sure you discuss any questions you have with your health care provider.   Document Released: 02/17/2005 Document Revised: 03/10/2014 Document Reviewed: 03/01/2011 Elsevier Interactive Patient Education [employer].

## 2015-03-19 NOTE — ED Notes (Signed)
Twisted her left ankle walking down steps today.

## 2015-03-19 NOTE — ED Provider Notes (Signed)
CSN: [id number]     Arrival date & time 03/19/15  1531 History  By signing my name below, I, Jennifer Rich, attest that this documentation has been prepared under the direction and in the presence of Malvin Johns, MD. Electronically Signed: Soijett Rich, ED Scribe. 03/19/2015. 5:57 PM.   Chief Complaint  Patient presents with  . Ankle Pain      Patient is a 50 y.o. female presenting with ankle pain. The history is provided by the patient. No language interpreter was used.  Ankle Pain Relieved by:  None tried Worsened by:  Activity and bearing weight Ineffective treatments:  None tried Associated symptoms: no back pain, no fever and no neck pain     Jennifer Rich is a 50 y.o. female with a medical hx of HTN, DM, who presents to the Emergency Department complaining of left ankle pain onset today PTA. She notes that she twisted her left ankle while ambulating down the steps today. She has been able to ambulate following the incident but not without any pain. Pt is having associated symptoms of gait problem due to pain and swelling. She notes that she has not tried any medications for the relief of her symptoms. She denies color change, wound, rash, and any other symptoms.   Pt PCP: Dr. Penni Homans     Past Medical History  Diagnosis Date  . Hypertension   . Chicken pox as a child  . Hyperlipidemia   . Vitamin D deficiency   . Thyroid disease   . Allergy   . Hyperglycemia   . Preventative health care 12/18/2012    Has seen Dr Carlota Raspberry for GYN in past  . Type II or unspecified type diabetes mellitus without mention of complication, not stated as uncontrolled   . Cervical cancer screening 01/25/2013  . Anxiety and depression 01/30/2013  . Goiter     Mildly enlarged thyroid TSH normal   . Obesity, unspecified 04/26/2013  . Pneumonia 05/02/2013  . 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 Obesity    Past Surgical History  Procedure Laterality Date   . None    . Wisdom tooth extraction  50 yrs old   Family History  Problem Relation Age of Onset  . Heart attack Maternal Grandfather 75  . Heart disease Maternal Grandfather   . Diabetes Maternal Grandmother     type 2  . Blindness Maternal Grandmother   . Hypertension Paternal Grandmother   . Stroke Paternal Grandfather   . Hypertension Sister   . Hypertension Sister    Social History  Substance Use Topics  . Smoking status: Never Smoker   . Smokeless tobacco: Never Used  . Alcohol Use: No   OB History    No data available     Review of Systems  Constitutional: Negative for fever.  Gastrointestinal: Negative for nausea and vomiting.  Musculoskeletal: Positive for joint swelling and arthralgias. Negative for back pain, gait problem and neck pain.  Skin: Negative for wound.  Neurological: Negative for weakness, numbness and headaches.      Allergies  Review of patient's allergies indicates no known allergies.  Home Medications   Prior to Admission medications   Medication Sig Start Date End Date Taking? Authorizing Provider  acetaminophen (TYLENOL) 500 MG tablet Take 500 mg by mouth every 6 (six) hours as needed for mild pain or headache.     Historical Provider, MD  ALPRAZolam Duanne Moron) 0.25 MG tablet Take 1  tablet (0.25 mg total) by mouth 2 (two) times daily as needed for anxiety. 07/19/14   Brunetta Jeans, PA-C  amLODipine (NORVASC) 5 MG tablet Take 1 tablet (5 mg total) by mouth daily. 10/10/14   Mosie Lukes, MD  BYSTOLIC 10 MG tablet TAKE 2 TABLETS BY MOUTH EVERY DAY 11/22/14   Mosie Lukes, MD  citalopram (CELEXA) 20 MG tablet Take 1 tablet (20 mg total) by mouth daily. Patient not taking: Reported on 01/03/2015 10/10/14   Mosie Lukes, MD  cyclobenzaprine (FLEXERIL) 10 MG tablet Take 1 tablet (10 mg total) by mouth 3 (three) times daily as needed for muscle spasms. 12/18/14   Merryl Hacker, MD  ergocalciferol (VITAMIN D2) 50000 UNITS capsule Take 1 capsule  (50,000 Units total) by mouth once a week. 10/12/14   Mosie Lukes, MD  ibuprofen (ADVIL,MOTRIN) 600 MG tablet Take 1 tablet (600 mg total) by mouth every 6 (six) hours as needed. Patient taking differently: Take 600 mg by mouth every 6 (six) hours as needed (pain).  12/18/14   Merryl Hacker, MD  losartan (COZAAR) 100 MG tablet TAKE 1 TABLET (100 MG TOTAL) BY MOUTH DAILY. 10/10/14   Mosie Lukes, MD  metFORMIN (GLUCOPHAGE-XR) 500 MG 24 hr tablet Take 500 mg by mouth daily with breakfast.    Historical Provider, MD  triamterene-hydrochlorothiazide (MAXZIDE-25) 37.5-25 MG per tablet Take 1 tablet by mouth every morning. 10/10/14   Mosie Lukes, MD   BP 166/103 mmHg  Pulse 94  Temp(Src) 98.3 F (36.8 C) (Oral)  Resp 18  Ht 5\' 7"  (1.702 m)  Wt 315 lb (142.883 kg)  BMI 49.32 kg/m2  SpO2 97%  LMP 02/20/2015 Physical Exam  Constitutional: She is oriented to person, place, and time. She appears well-developed and well-nourished. No distress.  HENT:  Head: Normocephalic and atraumatic.  Neck: Normal range of motion. Neck supple.  Cardiovascular: Normal rate.   Pulmonary/Chest: Effort normal.  Musculoskeletal: Normal range of motion. She exhibits edema and tenderness.  Swelling and tenderness over the lateral malleolus. No pain to proximal fibula. NVI  Neurological: She is alert and oriented to person, place, and time.  Skin: Skin is warm and dry. No rash noted. She is not diaphoretic.  Psychiatric: She has a normal mood and affect.  Nursing note and vitals reviewed.   ED Course  Procedures (including critical care time) DIAGNOSTIC STUDIES: Oxygen Saturation is 97% on RA, nl by my interpretation.    COORDINATION OF CARE: 5:56 PM Discussed treatment plan with pt at bedside which includes Left ankle xray and pt agreed to plan.    Labs Review Labs Reviewed - No data to display  Imaging Review Dg Ankle Complete Left  03/19/2015  CLINICAL DATA:  [place] and twisted ankle 2 days ago,  lateral pain, hypertension, type II diabetes mellitus, EXAM: LEFT ANKLE COMPLETE - 3+ VIEW COMPARISON:  08/09/2014 FINDINGS: Osseous mineralization normal. Joint spaces preserved. Soft tissue swelling greatest laterally. No acute fracture, dislocation or bone destruction. Plantar and Achilles insertion calcaneal spur formation again identified. IMPRESSION: No acute osseous abnormalities. Calcaneal spurring. Electronically Signed   By: Lavonia Dana M.D.   On: 03/19/2015 16:10   I have personally reviewed and evaluated these images as part of my medical decision-making.   EKG Interpretation None      MDM   Final diagnoses:  Ankle sprain, left, initial encounter   Patient presents with pain and swelling to her left ankle. There is  no evidence of fracture. She was placed in an ASO ankle splint and crutches. She was advised in ice and elevation. She was given a referral to follow-up with Dr. Barbaraann Barthel if her symptoms are not improving.   I personally performed the services described in this documentation, which was scribed in my presence.  The recorded information has been reviewed and considered.    Malvin Johns, MD 03/19/15 [phone]

## 2015-04-10 ENCOUNTER — Encounter (HOSPITAL_COMMUNITY): Payer: Self-pay | Admitting: Emergency Medicine

## 2015-04-10 ENCOUNTER — Emergency Department (HOSPITAL_COMMUNITY): Payer: 59

## 2015-04-10 ENCOUNTER — Emergency Department (HOSPITAL_COMMUNITY)
Admission: EM | Admit: 2015-04-10 | Discharge: 2015-04-10 | Disposition: A | Discharge status: Home / Self Care | Payer: 59 | Attending: Emergency Medicine | Admitting: Emergency Medicine

## 2015-04-10 ENCOUNTER — Telehealth: Payer: Self-pay | Admitting: Family Medicine

## 2015-04-10 ENCOUNTER — Encounter: Payer: Self-pay | Admitting: Family Medicine

## 2015-04-10 DIAGNOSIS — R05 Cough: Secondary | ICD-10-CM

## 2015-04-10 DIAGNOSIS — Z8619 Personal history of other infectious and parasitic diseases: Secondary | ICD-10-CM | POA: Diagnosis not present

## 2015-04-10 DIAGNOSIS — J159 Unspecified bacterial pneumonia: Secondary | ICD-10-CM | POA: Diagnosis not present

## 2015-04-10 DIAGNOSIS — Z7984 Long term (current) use of oral hypoglycemic drugs: Secondary | ICD-10-CM | POA: Diagnosis not present

## 2015-04-10 DIAGNOSIS — E119 Type 2 diabetes mellitus without complications: Secondary | ICD-10-CM | POA: Diagnosis not present

## 2015-04-10 DIAGNOSIS — J069 Acute upper respiratory infection, unspecified: Secondary | ICD-10-CM | POA: Diagnosis not present

## 2015-04-10 DIAGNOSIS — E669 Obesity, unspecified: Secondary | ICD-10-CM | POA: Diagnosis not present

## 2015-04-10 DIAGNOSIS — J189 Pneumonia, unspecified organism: Secondary | ICD-10-CM

## 2015-04-10 DIAGNOSIS — D849 Immunodeficiency, unspecified: Secondary | ICD-10-CM | POA: Diagnosis not present

## 2015-04-10 DIAGNOSIS — I10 Essential (primary) hypertension: Secondary | ICD-10-CM | POA: Diagnosis not present

## 2015-04-10 DIAGNOSIS — F419 Anxiety disorder, unspecified: Secondary | ICD-10-CM | POA: Insufficient documentation

## 2015-04-10 DIAGNOSIS — Z79899 Other long term (current) drug therapy: Secondary | ICD-10-CM | POA: Insufficient documentation

## 2015-04-10 DIAGNOSIS — R059 Cough, unspecified: Secondary | ICD-10-CM

## 2015-04-10 MED ORDER — ALBUTEROL SULFATE HFA 108 (90 BASE) MCG/ACT IN AERS
2.0000 | INHALATION_SPRAY | Freq: Once | RESPIRATORY_TRACT | Status: AC
  Administered 2015-04-10: 2 via RESPIRATORY_TRACT
  Filled 2015-04-10: qty 6.7

## 2015-04-10 MED ORDER — LEVOFLOXACIN 750 MG PO TABS: 750.0000 mg | ORAL_TABLET | Freq: Every day | ORAL | Status: DC

## 2015-04-10 MED ORDER — BENZONATATE 200 MG PO CAPS: 200.0000 mg | ORAL_CAPSULE | Freq: Three times a day (TID) | ORAL | Status: DC | PRN

## 2015-04-10 NOTE — ED Provider Notes (Signed)
CSN: [id number]     Arrival date & time 04/10/15  0258 History   First MD Initiated Contact with Patient 04/10/15 [phone]     Chief Complaint  Patient presents with  . Nasal Congestion  . Cough     (Consider location/radiation/quality/duration/timing/severity/associated sxs/prior Treatment) HPI Comments: Jennifer Rich is a 50 y.o. female with a PMHx of HTN, HLD, goiter, DM2, and obesity, who presents to the ED with complaints of cough 1 week with yellow sputum production. Additional symptoms include wheezing, fever with Tmax 101.0, yellow rhinorrhea, sore throat, voice hoarseness, and shortness of breath. She has been around several sick people at work. Symptoms have not improved with Tylenol and ibuprofen. No known aggravating factors. She denies any drooling, trismus, ear pain or drainage, eye pain or redness, eye itching, hemoptysis, chest pain, leg swelling, recent travel/surgery/immobilization, estrogen use, active cancer, personal or family history of DVT/PE, abdominal pain, nausea, vomiting, diarrhea, constipation, dysuria, hematuria, vaginal bleeding or discharge, numbness, tingling, or focal weakness. She is a nonsmoker. Her husband is also here with similar symptoms. Of note she has not taken her blood pressure medication yet today. She had pneumonia last year and states this feels similar  Patient is a 50 y.o. female presenting with cough. The history is provided by the patient and medical records. No language interpreter was used.  Cough Cough characteristics:  Productive Sputum characteristics:  Yellow Severity:  Mild Onset quality:  Gradual Duration:  1 week Timing:  Constant Progression:  Worsening Chronicity:  New Smoker: no   Context: sick contacts and upper respiratory infection   Relieved by:  Nothing Worsened by:  Nothing tried Ineffective treatments: ibuprofen and tylenol. Associated symptoms: fever (101), rhinorrhea, shortness of breath, sore throat and wheezing    Associated symptoms: no chest pain, no ear pain, no eye discharge and no myalgias   Risk factors: no recent travel     Past Medical History  Diagnosis Date  . Hypertension   . Chicken pox as a child  . Hyperlipidemia   . Vitamin D deficiency   . Thyroid disease   . Allergy   . Hyperglycemia   . Preventative health care 12/18/2012    Has seen Dr Carlota Raspberry for GYN in past  . Type II or unspecified type diabetes mellitus without mention of complication, not stated as uncontrolled   . Cervical cancer screening 01/25/2013  . Anxiety and depression 01/30/2013  . Goiter     Mildly enlarged thyroid TSH normal   . Obesity, unspecified 04/26/2013  . Pneumonia 05/02/2013  . 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 Obesity    Past Surgical History  Procedure Laterality Date  . None    . Wisdom tooth extraction  50 yrs old   Family History  Problem Relation Age of Onset  . Heart attack Maternal Grandfather 75  . Heart disease Maternal Grandfather   . Diabetes Maternal Grandmother     type 2  . Blindness Maternal Grandmother   . Hypertension Paternal Grandmother   . Stroke Paternal Grandfather   . Hypertension Sister   . Hypertension Sister    Social History  Substance Use Topics  . Smoking status: Never Smoker   . Smokeless tobacco: Never Used  . Alcohol Use: No   OB History    No data available     Review of Systems  Constitutional: Positive for fever (101).  HENT: Positive for rhinorrhea, sore throat and voice change. Negative  for drooling, ear discharge, ear pain and trouble swallowing.   Eyes: Negative for discharge and redness.  Respiratory: Positive for cough, shortness of breath and wheezing.   Cardiovascular: Negative for chest pain and leg swelling.  Gastrointestinal: Negative for nausea, vomiting, abdominal pain, diarrhea and constipation.  Genitourinary: Negative for dysuria, hematuria, vaginal bleeding and vaginal discharge.   Musculoskeletal: Negative for myalgias and arthralgias.  Skin: Negative for color change.  Allergic/Immunologic: Positive for immunocompromised state (diabetic).  Neurological: Negative for weakness and numbness.  Psychiatric/Behavioral: Negative for confusion.   10 Systems reviewed and are negative for acute change except as noted in the HPI.    Allergies  Review of patient's allergies indicates no known allergies.  Home Medications   Prior to Admission medications   Medication Sig Start Date End Date Taking? Authorizing Provider  acetaminophen (TYLENOL) 500 MG tablet Take 500 mg by mouth every 6 (six) hours as needed for mild pain or headache.     Historical Provider, MD  ALPRAZolam Duanne Moron) 0.25 MG tablet Take 1 tablet (0.25 mg total) by mouth 2 (two) times daily as needed for anxiety. 07/19/14   Brunetta Jeans, PA-C  amLODipine (NORVASC) 5 MG tablet Take 1 tablet (5 mg total) by mouth daily. 10/10/14   Mosie Lukes, MD  BYSTOLIC 10 MG tablet TAKE 2 TABLETS BY MOUTH EVERY DAY 11/22/14   Mosie Lukes, MD  citalopram (CELEXA) 20 MG tablet Take 1 tablet (20 mg total) by mouth daily. Patient not taking: Reported on 01/03/2015 10/10/14   Mosie Lukes, MD  cyclobenzaprine (FLEXERIL) 10 MG tablet Take 1 tablet (10 mg total) by mouth 3 (three) times daily as needed for muscle spasms. 12/18/14   Merryl Hacker, MD  ergocalciferol (VITAMIN D2) 50000 UNITS capsule Take 1 capsule (50,000 Units total) by mouth once a week. 10/12/14   Mosie Lukes, MD  ibuprofen (ADVIL,MOTRIN) 600 MG tablet Take 1 tablet (600 mg total) by mouth every 6 (six) hours as needed. Patient taking differently: Take 600 mg by mouth every 6 (six) hours as needed (pain).  12/18/14   Merryl Hacker, MD  losartan (COZAAR) 100 MG tablet TAKE 1 TABLET (100 MG TOTAL) BY MOUTH DAILY. 10/10/14   Mosie Lukes, MD  metFORMIN (GLUCOPHAGE-XR) 500 MG 24 hr tablet Take 500 mg by mouth daily with breakfast.    Historical Provider,  MD  triamterene-hydrochlorothiazide (MAXZIDE-25) 37.5-25 MG per tablet Take 1 tablet by mouth every morning. 10/10/14   Mosie Lukes, MD   BP 179/94 mmHg  Pulse 84  Temp(Src) 98.7 F (37.1 C) (Oral)  Resp 20  Wt 146.965 kg  SpO2 100%  LMP 03/27/2015 (Exact Date) Physical Exam  Constitutional: She is oriented to person, place, and time. Vital signs are normal. She appears well-developed and well-nourished.  Non-toxic appearance. No distress.  Afebrile, nontoxic, NAD. Mildly hypertensive, similar to prior visits  HENT:  Head: Normocephalic and atraumatic.  Right Ear: Hearing, tympanic membrane, external ear and ear canal normal.  Left Ear: Hearing, tympanic membrane, external ear and ear canal normal.  Nose: Mucosal edema and rhinorrhea present.  Mouth/Throat: Uvula is midline, oropharynx is clear and moist and mucous membranes are normal. No trismus in the jaw. No uvula swelling.  Ears are clear bilaterally. Nose with mild edema and rhinorrhea. Oropharynx clear and moist, without uvular swelling or deviation, no trismus or drooling, no tonsillar swelling or erythema, no exudates. Hoarse voice    Eyes: Conjunctivae and EOM  are normal. Right eye exhibits no discharge. Left eye exhibits no discharge.  Neck: Normal range of motion. Neck supple.  Cardiovascular: Normal rate, regular rhythm, normal heart sounds and intact distal pulses.  Exam reveals no gallop and no friction rub.   No murmur heard. Pulmonary/Chest: Effort normal. No respiratory distress. She has decreased breath sounds. She has no wheezes. She has no rhonchi. She has no rales.  Diminished breath sounds diffusely throughout, worse in the lower fields, but no w/r/r auscultated, likely secondary to diminished airflow through, no hypoxia or increased WOB, speaking in full sentences, SpO2 100% on RA   Abdominal: Soft. Normal appearance and bowel sounds are normal. She exhibits no distension. There is no tenderness. There is no  rigidity, no rebound, no guarding and no CVA tenderness.  Musculoskeletal: Normal range of motion.  MAE x4 Strength and sensation grossly intact Distal pulses intact No pedal edema  Neurological: She is alert and oriented to person, place, and time. She has normal strength. No sensory deficit.  Skin: Skin is warm, dry and intact. No rash noted.  Psychiatric: She has a normal mood and affect.  Nursing note and vitals reviewed.   ED Course  Procedures (including critical care time) Labs Review Labs Reviewed - No data to display  Imaging Review Dg Chest 2 View  04/10/2015  CLINICAL DATA:  Acute onset of cough, congestion and fever. Initial encounter. EXAM: CHEST  2 VIEW COMPARISON:  Chest radiograph performed 01/03/2015 FINDINGS: The lungs are mildly hypoexpanded. Vascular crowding and vascular congestion are noted. Increased interstitial markings may reflect mild interstitial edema or possibly pneumonia, given the patient's symptoms. No definite pleural effusion or pneumothorax is seen. The heart is mildly enlarged. No acute osseous abnormalities are seen. IMPRESSION: Lungs mildly hypoexpanded. Vascular congestion and mild cardiomegaly. Increased interstitial markings may reflect mild interstitial edema or possibly pneumonia, given the patient's symptoms. Electronically Signed   By: Garald Balding M.D.   On: 04/10/2015 06:51   I have personally reviewed and evaluated these images and lab results as part of my medical decision-making.   EKG Interpretation None      MDM   Final diagnoses:  Cough  Community acquired pneumonia  URI (upper respiratory infection)  Essential hypertension    50 y.o. female here with URI symptoms, productive cough, rhinorrhea. Pt is afebrile, clear throat, hoarse voice, mild rhinorrhea, with slightly diminished lung sounds diffusely but worse in the lower fields, no wheezing noted but likely due to how tight her airways are. Will give inhaler then reassess.  CXR showing interstitial marking increase which could be edema vs pna, given clinical picture, likely PNA. Will treat with levaquin. Will reassess after inhaler is given here  7:39 AM Lung sounds improved after inhaler, some focal congestion noted in R lower lung field. Improved airflow throughout. Pt feeling slightly better. Will d/c with inhaler, tessalon perles for nighttime use, and levaquin. F/up with PCP in 1wk. I explained the diagnosis and have given explicit precautions to return to the ER including for any other new or worsening symptoms. The patient understands and accepts the medical plan as it's been dictated and I have answered their questions. Discharge instructions concerning home care and prescriptions have been given. The patient is STABLE and is discharged to home in good condition.   BP 179/94 mmHg  Pulse 84  Temp(Src) 98.7 F (37.1 C) (Oral)  Resp 20  Wt 146.965 kg  SpO2 100%  LMP 03/27/2015 (Exact Date)  Meds ordered  this encounter  Medications  . albuterol (PROVENTIL HFA;VENTOLIN HFA) 108 (90 Base) MCG/ACT inhaler 2 puff    Sig:   . levofloxacin (LEVAQUIN) 750 MG tablet    Sig: Take 1 tablet (750 mg total) by mouth daily. X 7 days    Dispense:  7 tablet    Refill:  0    Order Specific Question:  Supervising Provider    Answer:  MILLER, BRIAN [[id number]]  . benzonatate (TESSALON) 200 MG capsule    Sig: Take 1 capsule (200 mg total) by mouth 3 (three) times daily as needed for cough.    Dispense:  20 capsule    Refill:  0    Order Specific Question:  Supervising Provider    Answer:  Noemi Chapel [[id number]]       Kada Friesen Camprubi-Soms, PA-C 04/10/15 0740  Carmin Muskrat, MD 04/10/15 [phone]

## 2015-04-10 NOTE — Telephone Encounter (Signed)
Completed letter per patient request from 04/10/15 through 04/17/2015 to return to work 04/18/2015.  Patient is going to schedule appt. With PCP on the 14th.Marland Kitchen  PCP signed letter and put at the front desk patient will pickup at her appt.

## 2015-04-10 NOTE — Discharge Instructions (Signed)
Continue to stay well-hydrated. Gargle warm salt water and spit it out. Continue to alternate between Tylenol and Ibuprofen for pain or fever. Use inhaler as directed, as needed for cough/chest congestion/wheezing. Use Tesslon pearls for cough suppression, but only at night preferably, so that you allow your body to still expel the congestion in your lungs. Use Mucinex for additional cough suppression/expectoration of mucus. Use netipot and flonase to help with nasal congestion. May consider over-the-counter Benadryl or other antihistamine to decrease secretions and for watery itchy eyes. Your blood pressure was high today, make sure you take your blood pressure medication, follow a low-salt diet. Followup with your primary care doctor in 5-7 days for recheck of ongoing symptoms. Return to emergency department for emergent changing or worsening of symptoms.   Community-Acquired Pneumonia, Adult Pneumonia is an infection of the lungs. One type of pneumonia can happen while a person is in a hospital. A different type can happen when a person is not in a hospital (community-acquired pneumonia). It is easy for this kind to spread from person to person. It can spread to you if you breathe near an infected person who coughs or sneezes. Some symptoms include:  A dry cough.  A wet (productive) cough.  Fever.  Sweating.  Chest pain. HOME CARE  Take over-the-counter and prescription medicines only as told by your doctor.  Only take cough medicine if you are losing sleep.  If you were prescribed an antibiotic medicine, take it as told by your doctor. Do not stop taking the antibiotic even if you start to feel better.  Sleep with your head and neck raised (elevated). You can do this by putting a few pillows under your head, or you can sleep in a recliner.  Do not use tobacco products. These include cigarettes, chewing tobacco, and e-cigarettes. If you need help quitting, ask your doctor.  Drink enough  water to keep your pee (urine) clear or pale yellow. A shot (vaccine) can help prevent pneumonia. Shots are often suggested for:  People older than 49 years of age.  People older than 49 years of age:  Who are having cancer treatment.  Who have long-term (chronic) lung disease.  Who have problems with their body's defense system (immune system). You may also prevent pneumonia if you take these actions:  Get the flu (influenza) shot every year.  Go to the dentist as often as told.  Wash your hands often. If soap and water are not available, use hand sanitizer. GET HELP IF:  You have a fever.  You lose sleep because your cough medicine does not help. GET HELP RIGHT AWAY IF:  You are short of breath and it gets worse.  You have more chest pain.  Your sickness gets worse. This is very serious if:  You are an older adult.  Your body's defense system is weak.  You cough up blood.   This information is not intended to replace advice given to you by your health care provider. Make sure you discuss any questions you have with your health care provider.   Document Released: 08/06/2007 Document Revised: 11/08/2014 Document Reviewed: 06/14/2014 Elsevier Interactive Patient Education 2016 [employer]  Cough, Adult A cough helps to clear your throat and lungs. A cough may last only 2-3 weeks (acute), or it may last longer than 8 weeks (chronic). Many different things can cause a cough. A cough may be a sign of an illness or another medical condition. HOME CARE  Pay attention to  any changes in your cough.  Take medicines only as told by your doctor.  If you were prescribed an antibiotic medicine, take it as told by your doctor. Do not stop taking it even if you start to feel better.  Talk with your doctor before you try using a cough medicine.  Drink enough fluid to keep your pee (urine) clear or pale yellow.  If the air is dry, use a cold steam vaporizer or humidifier in  your home.  Stay away from things that make you cough at work or at home.  If your cough is worse at night, try using extra pillows to raise your head up higher while you sleep.  Do not smoke, and try not to be around smoke. If you need help quitting, ask your doctor.  Do not have caffeine.  Do not drink alcohol.  Rest as needed. GET HELP IF:  You have new problems (symptoms).  You cough up yellow fluid (pus).  Your cough does not get better after 2-3 weeks, or your cough gets worse.  Medicine does not help your cough and you are not sleeping well.  You have pain that gets worse or pain that is not helped with medicine.  You have a fever.  You are losing weight and you do not know why.  You have night sweats. GET HELP RIGHT AWAY IF:  You cough up blood.  You have trouble breathing.  Your heartbeat is very fast.   This information is not intended to replace advice given to you by your health care provider. Make sure you discuss any questions you have with your health care provider.   Document Released: 10/31/2010 Document Revised: 11/08/2014 Document Reviewed: 04/26/2014 Elsevier Interactive Patient Education 2016 [employer] Inc[employer]  Upper Respiratory Infection, Adult Most upper respiratory infections (URIs) are a viral infection of the air passages leading to the lungs. A URI affects the nose, throat, and upper air passages. The most common type of URI is nasopharyngitis and is typically referred to as "the common cold." URIs run their course and usually go away on their own. Most of the time, a URI does not require medical attention, but sometimes a bacterial infection in the upper airways can follow a viral infection. This is called a secondary infection. Sinus and middle ear infections are common types of secondary upper respiratory infections. Bacterial pneumonia can also complicate a URI. A URI can worsen asthma and chronic obstructive pulmonary disease (COPD).  Sometimes, these complications can require emergency medical care and may be life threatening.  CAUSES Almost all URIs are caused by viruses. A virus is a type of germ and can spread from one person to another.  RISKS FACTORS You may be at risk for a URI if:   You smoke.   You have chronic heart or lung disease.  You have a weakened defense (immune) system.   You are very young or very old.   You have nasal allergies or asthma.  You work in crowded or poorly ventilated areas.  You work in health care facilities or schools. SIGNS AND SYMPTOMS  Symptoms typically develop 2-3 days after you come in contact with a cold virus. Most viral URIs last 7-10 days. However, viral URIs from the influenza virus (flu virus) can last 14-18 days and are typically more severe. Symptoms may include:   Runny or stuffy (congested) nose.   Sneezing.   Cough.   Sore throat.   Headache.   Fatigue.   Fever.  Loss of appetite.   Pain in your forehead, behind your eyes, and over your cheekbones (sinus pain).  Muscle aches.  DIAGNOSIS  Your health care provider may diagnose a URI by:  Physical exam.  Tests to check that your symptoms are not due to another condition such as:  Strep throat.  Sinusitis.  Pneumonia.  Asthma. TREATMENT  A URI goes away on its own with time. It cannot be cured with medicines, but medicines may be prescribed or recommended to relieve symptoms. Medicines may help:  Reduce your fever.  Reduce your cough.  Relieve nasal congestion. HOME CARE INSTRUCTIONS   Take medicines only as directed by your health care provider.   Gargle warm saltwater or take cough drops to comfort your throat as directed by your health care provider.  Use a warm mist humidifier or inhale steam from a shower to increase air moisture. This may make it easier to breathe.  Drink enough fluid to keep your urine clear or pale yellow.   Eat soups and other clear  broths and maintain good nutrition.   Rest as needed.   Return to work when your temperature has returned to normal or as your health care provider advises. You may need to stay home longer to avoid infecting others. You can also use a face mask and careful hand washing to prevent spread of the virus.  Increase the usage of your inhaler if you have asthma.   Do not use any tobacco products, including cigarettes, chewing tobacco, or electronic cigarettes. If you need help quitting, ask your health care provider. PREVENTION  The best way to protect yourself from getting a cold is to practice good hygiene.   Avoid oral or hand contact with people with cold symptoms.   Wash your hands often if contact occurs.  There is no clear evidence that vitamin C, vitamin E, echinacea, or exercise reduces the chance of developing a cold. However, it is always recommended to get plenty of rest, exercise, and practice good nutrition.  SEEK MEDICAL CARE IF:   You are getting worse rather than better.   Your symptoms are not controlled by medicine.   You have chills.  You have worsening shortness of breath.  You have brown or red mucus.  You have yellow or brown nasal discharge.  You have pain in your face, especially when you bend forward.  You have a fever.  You have swollen neck glands.  You have pain while swallowing.  You have white areas in the back of your throat. SEEK IMMEDIATE MEDICAL CARE IF:   You have severe or persistent:  Headache.  Ear pain.  Sinus pain.  Chest pain.  You have chronic lung disease and any of the following:  Wheezing.  Prolonged cough.  Coughing up blood.  A change in your usual mucus.  You have a stiff neck.  You have changes in your:  Vision.  Hearing.  Thinking.  Mood. MAKE SURE YOU:   Understand these instructions.  Will watch your condition.  Will get help right away if you are not doing well or get worse.   This  information is not intended to replace advice given to you by your health care provider. Make sure you discuss any questions you have with your health care provider.   Document Released: 08/13/2000 Document Revised: 07/04/2014 Document Reviewed: 05/25/2013 Elsevier Interactive Patient Education 2016 [employer].  Hypertension Hypertension, commonly called high blood pressure, is when the force of blood pumping  through your arteries is too strong. Your arteries are the blood vessels that carry blood from your heart throughout your body. A blood pressure reading consists of a higher number over a lower number, such as 110/72. The higher number (systolic) is the pressure inside your arteries when your heart pumps. The lower number (diastolic) is the pressure inside your arteries when your heart relaxes. Ideally you want your blood pressure below 120/80. Hypertension forces your heart to work harder to pump blood. Your arteries may become narrow or stiff. Having untreated or uncontrolled hypertension can cause heart attack, stroke, kidney disease, and other problems. RISK FACTORS Some risk factors for high blood pressure are controllable. Others are not.  Risk factors you cannot control include:   Race. You may be at higher risk if you are African American.  Age. Risk increases with age.  Gender. Men are at higher risk than women before age 64 years. After age 83, women are at higher risk than men. Risk factors you can control include:  Not getting enough exercise or physical activity.  Being overweight.  Getting too much fat, sugar, calories, or salt in your diet.  Drinking too much alcohol. SIGNS AND SYMPTOMS Hypertension does not usually cause signs or symptoms. Extremely high blood pressure (hypertensive crisis) may cause headache, anxiety, shortness of breath, and nosebleed. DIAGNOSIS To check if you have hypertension, your health care provider will measure your blood pressure while  you are seated, with your arm held at the level of your heart. It should be measured at least twice using the same arm. Certain conditions can cause a difference in blood pressure between your right and left arms. A blood pressure reading that is higher than normal on one occasion does not mean that you need treatment. If it is not clear whether you have high blood pressure, you may be asked to return on a different day to have your blood pressure checked again. Or, you may be asked to monitor your blood pressure at home for 1 or more weeks. TREATMENT Treating high blood pressure includes making lifestyle changes and possibly taking medicine. Living a healthy lifestyle can help lower high blood pressure. You may need to change some of your habits. Lifestyle changes may include:  Following the DASH diet. This diet is high in fruits, vegetables, and whole grains. It is low in salt, red meat, and added sugars.  Keep your sodium intake below 2,300 mg per day.  Getting at least 30-45 minutes of aerobic exercise at least 4 times per week.  Losing weight if necessary.  Not smoking.  Limiting alcoholic beverages.  Learning ways to reduce stress. Your health care provider may prescribe medicine if lifestyle changes are not enough to get your blood pressure under control, and if one of the following is true:  You are 56-70 years of age and your systolic blood pressure is above 140.  You are 84 years of age or older, and your systolic blood pressure is above 150.  Your diastolic blood pressure is above 90.  You have diabetes, and your systolic blood pressure is over [id number] or your diastolic blood pressure is over 90.  You have kidney disease and your blood pressure is above 140/90.  You have heart disease and your blood pressure is above 140/90. Your personal target blood pressure may vary depending on your medical conditions, your age, and other factors. HOME CARE INSTRUCTIONS  Have your blood  pressure rechecked as directed by your health care provider.  Take medicines only as directed by your health care provider. Follow the directions carefully. Blood pressure medicines must be taken as prescribed. The medicine does not work as well when you skip doses. Skipping doses also puts you at risk for problems.  Do not smoke.   Monitor your blood pressure at home as directed by your health care provider. SEEK MEDICAL CARE IF:   You think you are having a reaction to medicines taken.  You have recurrent headaches or feel dizzy.  You have swelling in your ankles.  You have trouble with your vision. SEEK IMMEDIATE MEDICAL CARE IF:  You develop a severe headache or confusion.  You have unusual weakness, numbness, or feel faint.  You have severe chest or abdominal pain.  You vomit repeatedly.  You have trouble breathing. MAKE SURE YOU:   Understand these instructions.  Will watch your condition.  Will get help right away if you are not doing well or get worse.   This information is not intended to replace advice given to you by your health care provider. Make sure you discuss any questions you have with your health care provider.   Document Released: 02/17/2005 Document Revised: 07/04/2014 Document Reviewed: 12/10/2012 Elsevier Interactive Patient Education 2016 [place] DASH stands for "Dietary Approaches to Stop Hypertension." The DASH eating plan is a healthy eating plan that has been shown to reduce high blood pressure (hypertension). Additional health benefits may include reducing the risk of type 2 diabetes mellitus, heart disease, and stroke. The DASH eating plan may also help with weight loss. WHAT DO I NEED TO KNOW ABOUT THE DASH EATING PLAN? For the DASH eating plan, you will follow these general guidelines:  Choose foods with a percent daily value for sodium of less than 5% (as listed on the food label).  Use salt-free seasonings  or herbs instead of table salt or sea salt.  Check with your health care provider or pharmacist before using salt substitutes.  Eat lower-sodium products, often labeled as "lower sodium" or "no salt added."  Eat fresh foods.  Eat more vegetables, fruits, and low-fat dairy products.  Choose whole grains. Look for the word "whole" as the first word in the ingredient list.  Choose fish and skinless chicken or [place] more often than red meat. Limit fish, poultry, and meat to 6 oz (170 g) each day.  Limit sweets, desserts, sugars, and sugary drinks.  Choose heart-healthy fats.  Limit cheese to 1 oz (28 g) per day.  Eat more home-cooked food and less restaurant, buffet, and fast food.  Limit fried foods.  Cook foods using methods other than frying.  Limit canned vegetables. If you do use them, rinse them well to decrease the sodium.  When eating at a restaurant, ask that your food be prepared with less salt, or no salt if possible. WHAT FOODS CAN I EAT? Seek help from a dietitian for individual calorie needs. Grains Whole grain or whole wheat bread. Brown rice. Whole grain or whole wheat pasta. Quinoa, bulgur, and whole grain cereals. Low-sodium cereals. Corn or whole wheat flour tortillas. Whole grain cornbread. Whole grain crackers. Low-sodium crackers. Vegetables Fresh or frozen vegetables (raw, steamed, roasted, or grilled). Low-sodium or reduced-sodium tomato and vegetable juices. Low-sodium or reduced-sodium tomato sauce and paste. Low-sodium or reduced-sodium canned vegetables.  Fruits All fresh, canned (in natural juice), or frozen fruits. Meat and Other Protein Products Ground beef (85% or leaner), grass-fed beef, or beef trimmed of fat.  Skinless chicken or [place]. Ground chicken or [place]. Pork trimmed of fat. All fish and seafood. Eggs. Dried beans, peas, or lentils. Unsalted nuts and seeds. Unsalted canned beans. Dairy Low-fat dairy products, such as skim or 1% milk, 2%  or reduced-fat cheeses, low-fat ricotta or cottage cheese, or plain low-fat yogurt. Low-sodium or reduced-sodium cheeses. Fats and Oils Tub margarines without trans fats. Light or reduced-fat mayonnaise and salad dressings (reduced sodium). Avocado. Safflower, olive, or canola oils. Natural peanut or almond butter. Other Unsalted popcorn and pretzels. The items listed above may not be a complete list of recommended foods or beverages. Contact your dietitian for more options. WHAT FOODS ARE NOT RECOMMENDED? Grains White bread. White pasta. White rice. Refined cornbread. Bagels and croissants. Crackers that contain trans fat. Vegetables Creamed or fried vegetables. Vegetables in a cheese sauce. Regular canned vegetables. Regular canned tomato sauce and paste. Regular tomato and vegetable juices. Fruits Dried fruits. Canned fruit in light or heavy syrup. Fruit juice. Meat and Other Protein Products Fatty cuts of meat. Ribs, chicken wings, bacon, sausage, bologna, salami, chitterlings, fatback, hot dogs, bratwurst, and packaged luncheon meats. Salted nuts and seeds. Canned beans with salt. Dairy Whole or 2% milk, cream, half-and-half, and cream cheese. Whole-fat or sweetened yogurt. Full-fat cheeses or blue cheese. Nondairy creamers and whipped toppings. Processed cheese, cheese spreads, or cheese curds. Condiments Onion and garlic salt, seasoned salt, table salt, and sea salt. Canned and packaged gravies. Worcestershire sauce. Tartar sauce. Barbecue sauce. Teriyaki sauce. Soy sauce, including reduced sodium. Steak sauce. Fish sauce. Oyster sauce. Cocktail sauce. Horseradish. Ketchup and mustard. Meat flavorings and tenderizers. Bouillon cubes. Hot sauce. Tabasco sauce. Marinades. Taco seasonings. Relishes. Fats and Oils Butter, stick margarine, lard, shortening, ghee, and bacon fat. Coconut, palm kernel, or palm oils. Regular salad dressings. Other Pickles and olives. Salted popcorn and  pretzels. The items listed above may not be a complete list of foods and beverages to avoid. Contact your dietitian for more information. WHERE CAN I FIND MORE INFORMATION? National Heart, Lung, and Blood Institute: travelstabloid.com   This information is not intended to replace advice given to you by your health care provider. Make sure you discuss any questions you have with your health care provider.   Document Released: 02/06/2011 Document Revised: 03/10/2014 Document Reviewed: 12/22/2012 Elsevier Interactive Patient Education [employer].

## 2015-04-10 NOTE — Telephone Encounter (Signed)
Caller name: Self  Can be reached: [phone]   Reason for call: Patient was seen in the ER this morning and dx with pneumonia. States she was told that her PCP had to take her out of work because they could not. Patient is requesting note for work. Plse adv

## 2015-04-10 NOTE — ED Notes (Signed)
Patient with week long history of cough, congestion and laryngitis.  Patient states that she has yellow mucous that she is coughing up.

## 2015-04-10 NOTE — Telephone Encounter (Signed)
I have reviewed ER documents I am OK to write her letter. Check with her about how many days she wants covered. Am willing to cover the rest of this week but warn her depending on the documentation her work may require me to say I saw her to justify this in which case I will need to get her in here for a quick visit.

## 2015-04-11 ENCOUNTER — Telehealth: Payer: Self-pay | Admitting: *Deleted

## 2015-04-11 NOTE — Telephone Encounter (Signed)
Received STD/FMLA paperwork to be completed when patient comes in for OV on 04/16/14; forwarded to provider/SLS 02/08

## 2015-04-13 ENCOUNTER — Ambulatory Visit: Payer: 59 | Admitting: Family Medicine

## 2015-04-16 ENCOUNTER — Ambulatory Visit: Payer: Self-pay | Admitting: Physician Assistant

## 2015-04-17 ENCOUNTER — Encounter: Payer: Self-pay | Admitting: Family Medicine

## 2015-04-17 ENCOUNTER — Ambulatory Visit (INDEPENDENT_AMBULATORY_CARE_PROVIDER_SITE_OTHER): Payer: 59 | Admitting: Family Medicine

## 2015-04-17 VITALS — BP 142/92 | HR 86 | Temp 98.6°F | Ht 67.0 in | Wt 321.5 lb

## 2015-04-17 DIAGNOSIS — J189 Pneumonia, unspecified organism: Secondary | ICD-10-CM

## 2015-04-17 DIAGNOSIS — I10 Essential (primary) hypertension: Secondary | ICD-10-CM | POA: Diagnosis not present

## 2015-04-17 HISTORY — DX: Pneumonia, unspecified organism: J18.9

## 2015-04-17 MED ORDER — DOXYCYCLINE HYCLATE 100 MG PO TABS: 100.0000 mg | ORAL_TABLET | Freq: Two times a day (BID) | ORAL | Status: DC

## 2015-04-17 MED ORDER — HYDROCODONE-HOMATROPINE 5-1.5 MG/5ML PO SYRP: 5.0000 mL | ORAL_SOLUTION | Freq: Every evening | ORAL | Status: DC | PRN

## 2015-04-17 NOTE — Progress Notes (Signed)
Pre visit review using our clinic review tool, if applicable. No additional management support is needed unless otherwise documented below in the visit note. 

## 2015-04-17 NOTE — Patient Instructions (Signed)
NOW probiotics   Community-Acquired Pneumonia, Adult Pneumonia is an infection of the lungs. There are different types of pneumonia. One type can develop while a person is in a hospital. A different type, called community-acquired pneumonia, develops in people who are not, or have not recently been, in the hospital or other health care facility.  CAUSES Pneumonia may be caused by bacteria, viruses, or funguses. Community-acquired pneumonia is often caused by Streptococcus pneumonia bacteria. These bacteria are often passed from one person to another by breathing in droplets from the cough or sneeze of an infected person. RISK FACTORS The condition is more likely to develop in:  People who havechronic diseases, such as chronic obstructive pulmonary disease (COPD), asthma, congestive heart failure, cystic fibrosis, diabetes, or kidney disease.  People who haveearly-stage or late-stage HIV.  People who havesickle cell disease.  People who havehad their spleen removed (splenectomy).  People who havepoor Human resources officer.  People who havemedical conditions that increase the risk of breathing in (aspirating) secretions their own mouth and nose.   People who havea weakened immune system (immunocompromised).  People who smoke.  People whotravel to areas where pneumonia-causing germs commonly exist.  People whoare around animal habitats or animals that have pneumonia-causing germs, including birds, bats, rabbits, cats, and farm animals. SYMPTOMS Symptoms of this condition include:  Adry cough.  A wet (productive) cough.  Fever.  Sweating.  Chest pain, especially when breathing deeply or coughing.  Rapid breathing or difficulty breathing.  Shortness of breath.  Shaking chills.  Fatigue.  Muscle aches. DIAGNOSIS Your health care provider will take a medical history and perform a physical exam. You may also have other tests, including:  Imaging studies of your  chest, including X-rays.  Tests to check your blood oxygen level and other blood gases.  Other tests on blood, mucus (sputum), fluid around your lungs (pleural fluid), and urine. If your pneumonia is severe, other tests may be done to identify the specific cause of your illness. TREATMENT The type of treatment that you receive depends on many factors, such as the cause of your pneumonia, the medicines you take, and other medical conditions that you have. For most adults, treatment and recovery from pneumonia may occur at home. In some cases, treatment must happen in a hospital. Treatment may include:  Antibiotic medicines, if the pneumonia was caused by bacteria.  Antiviral medicines, if the pneumonia was caused by a virus.  Medicines that are given by mouth or through an IV tube.  Oxygen.  Respiratory therapy. Although rare, treating severe pneumonia may include:  Mechanical ventilation. This is done if you are not breathing well on your own and you cannot maintain a safe blood oxygen level.  Thoracentesis. This procedureremoves fluid around one lung or both lungs to help you breathe better. HOME CARE INSTRUCTIONS  Take over-the-counter and prescription medicines only as told by your health care provider.  Only takecough medicine if you are losing sleep. Understand that cough medicine can prevent your body's natural ability to remove mucus from your lungs.  If you were prescribed an antibiotic medicine, take it as told by your health care provider. Do not stop taking the antibiotic even if you start to feel better.  Sleep in a semi-upright position at night. Try sleeping in a reclining chair, or place a few pillows under your head.  Do not use tobacco products, including cigarettes, chewing tobacco, and e-cigarettes. If you need help quitting, ask your health care provider.  Drink enough water  to keep your urine clear or pale yellow. This will help to thin out mucus secretions  in your lungs. PREVENTION There are ways that you can decrease your risk of developing community-acquired pneumonia. Consider getting a pneumococcal vaccine if:  You are older than 49 years of age.  You are older than 49 years of age and are undergoing cancer treatment, have chronic lung disease, or have other medical conditions that affect your immune system. Ask your health care provider if this applies to you. There are different types and schedules of pneumococcal vaccines. Ask your health care provider which vaccination option is best for you. You may also prevent community-acquired pneumonia if you take these actions:  Get an influenza vaccine every year. Ask your health care provider which type of influenza vaccine is best for you.  Go to the dentist on a regular basis.  Wash your hands often. Use hand sanitizer if soap and water are not available. SEEK MEDICAL CARE IF:  You have a fever.  You are losing sleep because you cannot control your cough with cough medicine. SEEK IMMEDIATE MEDICAL CARE IF:  You have worsening shortness of breath.  You have increased chest pain.  Your sickness becomes worse, especially if you are an older adult or have a weakened immune system.  You cough up blood.   This information is not intended to replace advice given to you by your health care provider. Make sure you discuss any questions you have with your health care provider.   Document Released: 02/17/2005 Document Revised: 11/08/2014 Document Reviewed: 06/14/2014 Elsevier Interactive Patient Education [employer].

## 2015-04-22 NOTE — Assessment & Plan Note (Signed)
Mild elevation with acute illness no changes today

## 2015-04-22 NOTE — Progress Notes (Signed)
Patient ID: Jennifer Rich, female   DOB: Jan 13, 1966, 50 y.o.   MRN: [id number]   Subjective:    Patient ID: Jennifer Rich, female    DOB: 07/19/1965, 50 y.o.   MRN: [id number]  Chief Complaint  Patient presents with  . Pneumonia    HPI Patient is in today for evaluation of pneumonia. Was treated with a course of antibiotics and improved partially but once they were completed her symptoms have worsened again. Notes cough productive of yellow sputum. No fevers or chills, does notes some fatigue and myalgias. Denies CP/palp/SOB/HAfevers/GI or GU c/o. Taking meds as prescribed  Past Medical History  Diagnosis Date  . Hypertension   . Chicken pox as a child  . Hyperlipidemia   . Vitamin D deficiency   . Thyroid disease   . Allergy   . Hyperglycemia   . Preventative health care 12/18/2012    Has seen Dr Carlota Raspberry for GYN in past  . Type II or unspecified type diabetes mellitus without mention of complication, not stated as uncontrolled   . Cervical cancer screening 01/25/2013  . Anxiety and depression 01/30/2013  . Goiter     Mildly enlarged thyroid TSH normal   . Obesity, unspecified 04/26/2013  . Pneumonia 05/02/2013  . 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 Obesity   . CAP (community acquired pneumonia) 04/17/2015    Past Surgical History  Procedure Laterality Date  . None    . Wisdom tooth extraction  50 yrs old    Family History  Problem Relation Age of Onset  . Heart attack Maternal Grandfather 75  . Heart disease Maternal Grandfather   . Diabetes Maternal Grandmother     type 2  . Blindness Maternal Grandmother   . Hypertension Paternal Grandmother   . Stroke Paternal Grandfather   . Hypertension Sister   . Hypertension Sister     Social History   Social History  . Marital Status: Married    Spouse Name: N/A  . Number of Children: 0  . Years of Education: N/A   Occupational History  . SALES [employer]    Social  History Main Topics  . Smoking status: Never Smoker   . Smokeless tobacco: Never Used  . Alcohol Use: No  . Drug Use: No  . Sexual Activity:    Partners: Male     Comment: lives with husband and [place], [name], no dietary restrictions.   Other Topics Concern  . Not on file   Social History Narrative   No Pork          Outpatient Prescriptions Prior to Visit  Medication Sig Dispense Refill  . acetaminophen (TYLENOL) 500 MG tablet Take 500 mg by mouth every 6 (six) hours as needed for mild pain or headache.     . ALPRAZolam (XANAX) 0.25 MG tablet Take 1 tablet (0.25 mg total) by mouth 2 (two) times daily as needed for anxiety. 20 tablet 1  . amLODipine (NORVASC) 5 MG tablet Take 1 tablet (5 mg total) by mouth daily. 30 tablet 3  . benzonatate (TESSALON) 200 MG capsule Take 1 capsule (200 mg total) by mouth 3 (three) times daily as needed for cough. 20 capsule 0  . BYSTOLIC 10 MG tablet TAKE 2 TABLETS BY MOUTH EVERY DAY 60 tablet 1  . citalopram (CELEXA) 20 MG tablet Take 1 tablet (20 mg total) by mouth daily. (Patient not taking: Reported on 01/03/2015) 30 tablet 1  .  cyclobenzaprine (FLEXERIL) 10 MG tablet Take 1 tablet (10 mg total) by mouth 3 (three) times daily as needed for muscle spasms. 20 tablet 0  . ergocalciferol (VITAMIN D2) 50000 UNITS capsule Take 1 capsule (50,000 Units total) by mouth once a week. 4 capsule 4  . ibuprofen (ADVIL,MOTRIN) 600 MG tablet Take 1 tablet (600 mg total) by mouth every 6 (six) hours as needed. (Patient taking differently: Take 600 mg by mouth every 6 (six) hours as needed (pain). ) 30 tablet 0  . losartan (COZAAR) 100 MG tablet TAKE 1 TABLET (100 MG TOTAL) BY MOUTH DAILY. 30 tablet 3  . metFORMIN (GLUCOPHAGE-XR) 500 MG 24 hr tablet Take 500 mg by mouth daily with breakfast.    . triamterene-hydrochlorothiazide (MAXZIDE-25) 37.5-25 MG per tablet Take 1 tablet by mouth every morning. 30 tablet 3  . levofloxacin (LEVAQUIN) 750 MG tablet Take 1  tablet (750 mg total) by mouth daily. X 7 days 7 tablet 0   No facility-administered medications prior to visit.    No Known Allergies  Review of Systems  Constitutional: Positive for malaise/fatigue. Negative for fever.  HENT: Positive for congestion.   Eyes: Negative for discharge.  Respiratory: Positive for cough and sputum production. Negative for shortness of breath.   Cardiovascular: Negative for chest pain, palpitations and leg swelling.  Gastrointestinal: Negative for nausea and abdominal pain.  Genitourinary: Negative for dysuria.  Musculoskeletal: Negative for falls.  Skin: Negative for rash.  Neurological: Negative for loss of consciousness and headaches.  Endo/Heme/Allergies: Negative for environmental allergies.  Psychiatric/Behavioral: Negative for depression. The patient is not nervous/anxious.        Objective:    Physical Exam  Constitutional: She is oriented to person, place, and time. She appears well-developed and well-nourished. No distress.  HENT:  Head: Normocephalic and atraumatic.  Nose: Nose normal.  Eyes: Right eye exhibits no discharge. Left eye exhibits no discharge.  Neck: Normal range of motion. Neck supple.  Cardiovascular: Normal rate and regular rhythm.   No murmur heard. Pulmonary/Chest: Effort normal and breath sounds normal.  Decreased breath sounds, b/l lower lobes  Abdominal: Soft. Bowel sounds are normal. There is no tenderness.  Musculoskeletal: She exhibits no edema.  Neurological: She is alert and oriented to person, place, and time.  Skin: Skin is warm and dry.  Psychiatric: She has a normal mood and affect.  Nursing note and vitals reviewed.   BP 142/92 mmHg  Pulse 86  Temp(Src) 98.6 F (37 C) (Oral)  Ht 5\' 7"  (1.702 m)  Wt 321 lb 8 oz (145.831 kg)  BMI 50.34 kg/m2  SpO2 97%  LMP 03/27/2015 (Exact Date) Wt Readings from Last 3 Encounters:  04/17/15 321 lb 8 oz (145.831 kg)  04/10/15 324 lb (146.965 kg)  03/19/15 315  lb (142.883 kg)     Lab Results  Component Value Date   WBC 5.3 01/03/2015   HGB 13.0 01/03/2015   HCT 39.9 01/03/2015   PLT 243 01/03/2015   GLUCOSE 115* 01/03/2015   CHOL 195 10/10/2014   TRIG 132.0 10/10/2014   HDL 41.40 10/10/2014   LDLCALC 128* 10/10/2014   ALT 7 10/10/2014   AST 16 10/10/2014   NA 139 01/03/2015   K 3.5 01/03/2015   CL 104 01/03/2015   CREATININE 0.98 01/03/2015   BUN 9 01/03/2015   CO2 26 01/03/2015   TSH 2.49 10/10/2014   HGBA1C 6.3 10/10/2014    Lab Results  Component Value Date   TSH 2.49 10/10/2014  Lab Results  Component Value Date   WBC 5.3 01/03/2015   HGB 13.0 01/03/2015   HCT 39.9 01/03/2015   MCV 77.6* 01/03/2015   PLT 243 01/03/2015   Lab Results  Component Value Date   NA 139 01/03/2015   K 3.5 01/03/2015   CO2 26 01/03/2015   GLUCOSE 115* 01/03/2015   BUN 9 01/03/2015   CREATININE 0.98 01/03/2015   BILITOT 0.4 10/10/2014   ALKPHOS 72 10/10/2014   AST 16 10/10/2014   ALT 7 10/10/2014   PROT 7.5 10/10/2014   ALBUMIN 3.7 10/10/2014   CALCIUM 8.9 01/03/2015   ANIONGAP 9 01/03/2015   GFR 94.13 10/10/2014   Lab Results  Component Value Date   CHOL 195 10/10/2014   Lab Results  Component Value Date   HDL 41.40 10/10/2014   Lab Results  Component Value Date   LDLCALC 128* 10/10/2014   Lab Results  Component Value Date   TRIG 132.0 10/10/2014   Lab Results  Component Value Date   CHOLHDL 5 10/10/2014   Lab Results  Component Value Date   HGBA1C 6.3 10/10/2014       Assessment & Plan:   Problem List Items Addressed This Visit    CAP (community acquired pneumonia) - Primary    Started on Doxycyline and Mucinex. Encouraged increased rest and hydration, add probiotics, zinc such as Coldeze or Xicam. Treat fevers as needed      Relevant Medications   HYDROcodone-homatropine (HYCODAN) 5-1.5 MG/5ML syrup   doxycycline (VIBRA-TABS) 100 MG tablet   Essential hypertension, benign    Mild elevation with  acute illness no changes today         I have discontinued Jennifer Rich's levofloxacin. I am also having her start on HYDROcodone-homatropine and doxycycline. Additionally, I am having her maintain her acetaminophen, ALPRAZolam, amLODipine, citalopram, losartan, triamterene-hydrochlorothiazide, metFORMIN, ergocalciferol, BYSTOLIC, cyclobenzaprine, ibuprofen, and benzonatate.  Meds ordered this encounter  Medications  . HYDROcodone-homatropine (HYCODAN) 5-1.5 MG/5ML syrup    Sig: Take 5 mLs by mouth at bedtime as needed for cough.    Dispense:  180 mL    Refill:  0  . doxycycline (VIBRA-TABS) 100 MG tablet    Sig: Take 1 tablet (100 mg total) by mouth 2 (two) times daily.    Dispense:  20 tablet    Refill:  0     Penni Homans, MD

## 2015-04-22 NOTE — Assessment & Plan Note (Signed)
Started on Doxycyline and Mucinex. Encouraged increased rest and hydration, add probiotics, zinc such as Coldeze or Xicam. Treat fevers as needed

## 2015-05-25 ENCOUNTER — Emergency Department (HOSPITAL_COMMUNITY)
Admission: EM | Admit: 2015-05-25 | Discharge: 2015-05-25 | Disposition: A | Discharge status: Home / Self Care | Payer: 59 | Attending: Emergency Medicine | Admitting: Emergency Medicine

## 2015-05-25 ENCOUNTER — Encounter (HOSPITAL_COMMUNITY): Payer: Self-pay | Admitting: Emergency Medicine

## 2015-05-25 ENCOUNTER — Emergency Department (HOSPITAL_COMMUNITY): Payer: 59

## 2015-05-25 DIAGNOSIS — E119 Type 2 diabetes mellitus without complications: Secondary | ICD-10-CM | POA: Insufficient documentation

## 2015-05-25 DIAGNOSIS — I10 Essential (primary) hypertension: Secondary | ICD-10-CM | POA: Diagnosis not present

## 2015-05-25 DIAGNOSIS — Z8619 Personal history of other infectious and parasitic diseases: Secondary | ICD-10-CM | POA: Diagnosis not present

## 2015-05-25 DIAGNOSIS — Z79899 Other long term (current) drug therapy: Secondary | ICD-10-CM | POA: Diagnosis not present

## 2015-05-25 DIAGNOSIS — Z8659 Personal history of other mental and behavioral disorders: Secondary | ICD-10-CM | POA: Diagnosis not present

## 2015-05-25 DIAGNOSIS — E669 Obesity, unspecified: Secondary | ICD-10-CM | POA: Diagnosis not present

## 2015-05-25 DIAGNOSIS — Y9389 Activity, other specified: Secondary | ICD-10-CM | POA: Diagnosis not present

## 2015-05-25 DIAGNOSIS — E559 Vitamin D deficiency, unspecified: Secondary | ICD-10-CM | POA: Diagnosis not present

## 2015-05-25 DIAGNOSIS — Y9289 Other specified places as the place of occurrence of the external cause: Secondary | ICD-10-CM | POA: Insufficient documentation

## 2015-05-25 DIAGNOSIS — Z792 Long term (current) use of antibiotics: Secondary | ICD-10-CM | POA: Insufficient documentation

## 2015-05-25 DIAGNOSIS — X500XXA Overexertion from strenuous movement or load, initial encounter: Secondary | ICD-10-CM | POA: Insufficient documentation

## 2015-05-25 DIAGNOSIS — S99912A Unspecified injury of left ankle, initial encounter: Secondary | ICD-10-CM | POA: Insufficient documentation

## 2015-05-25 DIAGNOSIS — Z7984 Long term (current) use of oral hypoglycemic drugs: Secondary | ICD-10-CM | POA: Diagnosis not present

## 2015-05-25 DIAGNOSIS — Z8701 Personal history of pneumonia (recurrent): Secondary | ICD-10-CM | POA: Diagnosis not present

## 2015-05-25 DIAGNOSIS — M25572 Pain in left ankle and joints of left foot: Secondary | ICD-10-CM

## 2015-05-25 DIAGNOSIS — Y999 Unspecified external cause status: Secondary | ICD-10-CM | POA: Insufficient documentation

## 2015-05-25 MED ORDER — HYDROCODONE-ACETAMINOPHEN 5-325 MG PO TABS: 2.0000 | ORAL_TABLET | ORAL | Status: DC | PRN

## 2015-05-25 MED ORDER — ONDANSETRON HCL 4 MG PO TABS: 4.0000 mg | ORAL_TABLET | Freq: Four times a day (QID) | ORAL | Status: DC

## 2015-05-25 NOTE — ED Notes (Signed)
Pt discharged independent and ambulatory with crutches

## 2015-05-25 NOTE — ED Notes (Signed)
Pt reports stepping out of her vehicle last night and "twisted" her ankle.  Reports waking up this morning with sharp and throbbing pains.

## 2015-05-25 NOTE — ED Provider Notes (Signed)
CSN: [id number]     Arrival date & time 05/25/15  1522 History  By signing my name below, I, Doran Stabler, attest that this documentation has been prepared under the direction and in the presence of Shary Decamp, PA-C. Electronically Signed: Doran Stabler, ED Scribe. 05/25/2015. 3:41 PM.   No chief complaint on file.  The history is provided by the patient. No language interpreter was used.   HPI Comments: Jennifer Rich is a 50 y.o. female who presents to the Emergency Department with no pertinent PMHx complaing of a sharp throbbing left ankle pain with associated swelling that began last night. Pt reports as she was stepping out of her truck, she rolled her ankle inwardly. She rates her pain an 8/10 in severity and took ibuprofen with mild relief. Pt denies any fevers, chills, or any other symptoms at this time.   Pt is a Radiation protection practitioner.   Past Medical History  Diagnosis Date  . Hypertension   . Chicken pox as a child  . Hyperlipidemia   . Vitamin D deficiency   . Thyroid disease   . Allergy   . Hyperglycemia   . Preventative health care 12/18/2012    Has seen Dr Carlota Raspberry for GYN in past  . Type II or unspecified type diabetes mellitus without mention of complication, not stated as uncontrolled   . Cervical cancer screening 01/25/2013  . Anxiety and depression 01/30/2013  . Goiter     Mildly enlarged thyroid TSH normal   . Obesity, unspecified 04/26/2013  . Pneumonia 05/02/2013  . 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 Obesity   . CAP (community acquired pneumonia) 04/17/2015   Past Surgical History  Procedure Laterality Date  . None    . Wisdom tooth extraction  50 yrs old   Family History  Problem Relation Age of Onset  . Heart attack Maternal Grandfather 75  . Heart disease Maternal Grandfather   . Diabetes Maternal Grandmother     type 2  . Blindness Maternal Grandmother   . Hypertension Paternal Grandmother   .  Stroke Paternal Grandfather   . Hypertension Sister   . Hypertension Sister    Social History  Substance Use Topics  . Smoking status: Never Smoker   . Smokeless tobacco: Never Used  . Alcohol Use: No   OB History    No data available     Review of Systems A complete 10 system review of systems was obtained and all systems are negative except as noted in the HPI and PMH.   Allergies  Review of patient's allergies indicates no known allergies.  Home Medications   Prior to Admission medications   Medication Sig Start Date End Date Taking? Authorizing Provider  acetaminophen (TYLENOL) 500 MG tablet Take 500 mg by mouth every 6 (six) hours as needed for mild pain or headache.     Historical Provider, MD  ALPRAZolam Duanne Moron) 0.25 MG tablet Take 1 tablet (0.25 mg total) by mouth 2 (two) times daily as needed for anxiety. 07/19/14   Brunetta Jeans, PA-C  amLODipine (NORVASC) 5 MG tablet Take 1 tablet (5 mg total) by mouth daily. 10/10/14   Mosie Lukes, MD  benzonatate (TESSALON) 200 MG capsule Take 1 capsule (200 mg total) by mouth 3 (three) times daily as needed for cough. 04/10/15   Mercedes Camprubi-Soms, PA-C  BYSTOLIC 10 MG tablet TAKE 2 TABLETS BY MOUTH EVERY DAY 11/22/14   Bonnita Levan  Charlett Blake, MD  citalopram (CELEXA) 20 MG tablet Take 1 tablet (20 mg total) by mouth daily. Patient not taking: Reported on 01/03/2015 10/10/14   Mosie Lukes, MD  cyclobenzaprine (FLEXERIL) 10 MG tablet Take 1 tablet (10 mg total) by mouth 3 (three) times daily as needed for muscle spasms. 12/18/14   Merryl Hacker, MD  doxycycline (VIBRA-TABS) 100 MG tablet Take 1 tablet (100 mg total) by mouth 2 (two) times daily. 04/17/15   Mosie Lukes, MD  ergocalciferol (VITAMIN D2) 50000 UNITS capsule Take 1 capsule (50,000 Units total) by mouth once a week. 10/12/14   Mosie Lukes, MD  HYDROcodone-homatropine [facility]) 5-1.5 MG/5ML syrup Take 5 mLs by mouth at bedtime as needed for cough. 04/17/15   Mosie Lukes,  MD  ibuprofen (ADVIL,MOTRIN) 600 MG tablet Take 1 tablet (600 mg total) by mouth every 6 (six) hours as needed. Patient taking differently: Take 600 mg by mouth every 6 (six) hours as needed (pain).  12/18/14   Merryl Hacker, MD  losartan (COZAAR) 100 MG tablet TAKE 1 TABLET (100 MG TOTAL) BY MOUTH DAILY. 10/10/14   Mosie Lukes, MD  metFORMIN (GLUCOPHAGE-XR) 500 MG 24 hr tablet Take 500 mg by mouth daily with breakfast.    Historical Provider, MD  triamterene-hydrochlorothiazide (MAXZIDE-25) 37.5-25 MG per tablet Take 1 tablet by mouth every morning. 10/10/14   Mosie Lukes, MD   BP 162/114 mmHg  Pulse 86  Temp(Src) 98.3 F (36.8 C) (Oral)  Resp 16  SpO2 99%   Physical Exam  Constitutional: She is oriented to person, place, and time. She appears well-developed and well-nourished.  HENT:  Head: Normocephalic and atraumatic.  Eyes: EOM are normal.  Neck: Normal range of motion.  Cardiovascular: Normal rate.   Pulmonary/Chest: Effort normal.  Abdominal: She exhibits no distension.  Musculoskeletal: Normal range of motion.       Left ankle: She exhibits swelling. She exhibits normal range of motion, no ecchymosis and no deformity. Tenderness. Lateral malleolus tenderness found. No medial malleolus, no AITFL, no CF ligament, no posterior TFL, no head of 5th metatarsal and no proximal fibula tenderness found. Achilles tendon normal.  Distal pulses intact. Cap refill <2sec. Pt able to ambulate. Full ROM.  Neurological: She is alert and oriented to person, place, and time.  Skin: Skin is warm and dry.  Psychiatric: She has a normal mood and affect.  Nursing note and vitals reviewed.  ED Course  Procedures   DIAGNOSTIC STUDIES: Oxygen Saturation is 99% on room air, normal by my interpretation.    COORDINATION OF CARE: 3:38 PM With order x-ray of left ankle. Discussed treatment plan with pt at bedside and pt agreed to plan.  Imaging Review Dg Ankle Complete Left  05/25/2015   CLINICAL DATA:  Rolled left ankle our last night while getting out of a truck. Lateral ankle swelling and pain. Initial encounter. EXAM: LEFT ANKLE COMPLETE - 3+ VIEW COMPARISON:  03/19/2015 FINDINGS: There is mild soft tissue swelling diffusely about the ankle. No acute fracture or dislocation is identified. Os peroneum accessory ossicles are again seen. There are small plantar and posterior calcaneal enthesophytes. IMPRESSION: Soft tissue swelling without acute osseous abnormality identified. Electronically Signed   By: Logan Bores M.D.   On: 05/25/2015 16:13   I have personally reviewed and evaluated these images and lab results as part of my medical decision-making.   MDM  I have reviewed and evaluated the relevant imaging studies. I have  reviewed the relevant previous healthcare records. I obtained HPI from historian.  ED Course:  Assessment: Pt is a 50yF who presents with left ankle pain. Noted after exiting truck and inverting ankle with full weight. On exam, pt in NAD. Nontoxic/nonseptic appearing. VSS. Afebrile. Full ROM left ankle. Swelling noted on lateral malleolus. Distal pulses intact. Cap refill <2sec. Pt able to ambulate. XR ankle obtained and showed no acute abnormalities. Most likely ankle sprain. Given ankle brace in ED. Plan is to DC home with follow up to PCP should symptoms no resolve. At time of discharge, Patient is in no acute distress. Vital Signs are stable. Patient is able to ambulate. Patient able to tolerate PO.   Disposition/Plan:  DC home Additional Verbal discharge instructions given and discussed with patient.  Pt Instructed to f/u with PCP in the next week for evaluation and treatment of symptoms. Return precautions given Pt acknowledges and agrees with plan  Supervising Physician Fredia Sorrow, MD   Final diagnoses:  Left ankle pain    I personally performed the services described in this documentation, which was scribed in my presence. The recorded  information has been reviewed and is accurate.    Shary Decamp, PA-C 05/25/15 [place], PA-C 05/25/15 [id number]  Fredia Sorrow, MD 05/30/15 1005

## 2015-05-25 NOTE — Discharge Instructions (Signed)
Please read and follow all provided instructions.  Your diagnoses today include:  1. Left ankle pain    Tests performed today include:  Vital signs. See below for your results today.   Medications prescribed:   None  Home care instructions:  Follow any educational materials contained in this packet.  Follow-up instructions: Please follow-up with your primary care provider for further evaluation of symptoms and treatment   Return instructions:   Please return to the Emergency Department if you do not get better, if you get worse, or new symptoms OR  - Fever (temperature greater than 101.60F)  - Bleeding that does not stop with holding pressure to the area    -Severe pain (please note that you may be more sore the day after your accident)  - Chest Pain  - Difficulty breathing  - Severe nausea or vomiting  - Inability to tolerate food and liquids  - Passing out  - Skin becoming red around your wounds  - Change in mental status (confusion or lethargy)  - New numbness or weakness     Please return if you have any other emergent concerns.  Additional Information:  Your vital signs today were: BP 162/114 mmHg   Pulse 86   Temp(Src) 98.3 F (36.8 C) (Oral)   Resp 16   SpO2 99% If your blood pressure (BP) was elevated above 135/85 this visit, please have this repeated by your doctor within one month. ---------------

## 2015-05-30 MED FILL — LOSARTAN POTASSIUM 100 MG T: 100 | 30 days supply | Qty: 30 | Fill #2 | Status: TO

## 2015-05-30 MED FILL — TRIAMTERENE/HCTZ 37.5/25 TB: 37.5-25 | 30 days supply | Qty: 30 | Fill #2

## 2015-05-30 MED FILL — AMLODIPINE BESYLATE 5 MG TA: 5 | 30 days supply | Qty: 30 | Fill #1 | Status: TO

## 2015-05-30 MED FILL — BYSTOLIC 10 MG TABLET: 10 | 30 days supply | Qty: 90 | Fill #1

## 2015-06-07 ENCOUNTER — Encounter: Payer: Self-pay | Admitting: Medical

## 2015-06-07 ENCOUNTER — Ambulatory Visit (INDEPENDENT_AMBULATORY_CARE_PROVIDER_SITE_OTHER): Payer: 59 | Admitting: Medical

## 2015-06-07 VITALS — BP 130/84 | HR 78 | Temp 98.4°F | Ht 67.0 in | Wt 323.0 lb

## 2015-06-07 DIAGNOSIS — R059 Cough, unspecified: Secondary | ICD-10-CM

## 2015-06-07 DIAGNOSIS — J209 Acute bronchitis, unspecified: Secondary | ICD-10-CM | POA: Diagnosis not present

## 2015-06-07 DIAGNOSIS — R05 Cough: Secondary | ICD-10-CM

## 2015-06-07 DIAGNOSIS — R062 Wheezing: Secondary | ICD-10-CM

## 2015-06-07 DIAGNOSIS — J111 Influenza due to unidentified influenza virus with other respiratory manifestations: Secondary | ICD-10-CM | POA: Diagnosis not present

## 2015-06-07 LAB — POC INFLUENZA A&B (BINAX/QUICKVUE)
Influenza A, POC: NEGATIVE
Influenza B, POC: NEGATIVE

## 2015-06-07 MED ORDER — ALBUTEROL SULFATE HFA 108 (90 BASE) MCG/ACT IN AERS: 2.0000 | INHALATION_SPRAY | Freq: Four times a day (QID) | RESPIRATORY_TRACT | Status: DC | PRN

## 2015-06-07 MED ORDER — OSELTAMIVIR PHOSPHATE 75 MG PO CAPS: 75.0000 mg | ORAL_CAPSULE | Freq: Two times a day (BID) | ORAL | Status: DC

## 2015-06-07 MED ORDER — DOXYCYCLINE HYCLATE 100 MG PO TABS: 100.0000 mg | ORAL_TABLET | Freq: Two times a day (BID) | ORAL | Status: DC

## 2015-06-07 MED ORDER — BECLOMETHASONE DIPROPIONATE 40 MCG/ACT IN AERS: 2.0000 | INHALATION_SPRAY | Freq: Two times a day (BID) | RESPIRATORY_TRACT | Status: DC

## 2015-06-07 MED ORDER — HYDROCODONE-HOMATROPINE 5-1.5 MG/5ML PO SYRP: 5.0000 mL | ORAL_SOLUTION | Freq: Three times a day (TID) | ORAL | Status: DC | PRN

## 2015-06-07 NOTE — Addendum Note (Signed)
Addended by: Tasia Catchings on: 06/07/2015 06:13 PM   Modules accepted: Orders

## 2015-06-07 NOTE — Progress Notes (Signed)
Pre visit review using our clinic review tool, if applicable. No additional management support is needed unless otherwise documented below in the visit note. 

## 2015-06-07 NOTE — Patient Instructions (Addendum)
For flu syndrome rx tamiflu.  For wheezing rx qvar and albuterol.  For cough hycodan  For bronchitis following the flu rx doxycycline  If chest congestion worsens or wheezing worsens notify us and will get cxr.  Work not until Monday.  Follow up in 7 days or as needed

## 2015-06-07 NOTE — Addendum Note (Signed)
Addended by: Tasia Catchings on: 06/07/2015 06:26 PM   Modules accepted: Orders

## 2015-06-07 NOTE — Progress Notes (Signed)
Subjective:    Patient ID: Jennifer Rich, female    DOB: 1965-03-29, 50 y.o.   MRN: [id number]  HPI  Pt in with some chest congestion, productive cough and some wheezing. Symptoms started quickly within 2 days. No hx of asthma.   Pt states 2 months ago had pnuemonia.  LMP- on cycle presently.   Last night chills, body aches and fatigue..   Review of Systems  Constitutional: Positive for fever, chills and fatigue.  HENT: Positive for congestion.   Respiratory: Positive for cough and wheezing. Negative for chest tightness and shortness of breath.   Cardiovascular: Negative for chest pain and palpitations.  Gastrointestinal: Negative for abdominal pain.  Musculoskeletal: Positive for myalgias.  Skin: Negative for rash.  Hematological: Negative for adenopathy. Does not bruise/bleed easily.     Past Medical History  Diagnosis Date  . Hypertension   . Chicken pox as a child  . Hyperlipidemia   . Vitamin D deficiency   . Allergy   . Hyperglycemia   . Preventative health care 12/18/2012    Has seen Dr Carlota Raspberry for GYN in past  . Type II or unspecified type diabetes mellitus without mention of complication, not stated as uncontrolled   . Cervical cancer screening 01/25/2013  . Anxiety and depression 01/30/2013  . Obesity, unspecified 04/26/2013  . Pneumonia 05/02/2013  . 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 Obesity   . CAP (community acquired pneumonia) 04/17/2015  . Thyroid disease     No per pt 05/25/15  . Goiter     Mildly enlarged thyroid TSH normal     Social History   Social History  . Marital Status: Married    Spouse Name: N/A  . Number of Children: 0  . Years of Education: N/A   Occupational History  . SALES [employer]    Social History Main Topics  . Smoking status: Never Smoker   . Smokeless tobacco: Never Used  . Alcohol Use: No  . Drug Use: No  . Sexual Activity:    Partners: Male     Comment: lives with husband  and [place], [name], no dietary restrictions.   Other Topics Concern  . Not on file   Social History Narrative   No Pork          Past Surgical History  Procedure Laterality Date  . None    . Wisdom tooth extraction  50 yrs old    Family History  Problem Relation Age of Onset  . Heart attack Maternal Grandfather 75  . Heart disease Maternal Grandfather   . Diabetes Maternal Grandmother     type 2  . Blindness Maternal Grandmother   . Hypertension Paternal Grandmother   . Stroke Paternal Grandfather   . Hypertension Sister   . Hypertension Sister     No Known Allergies  Current Outpatient Prescriptions on File Prior to Visit  Medication Sig Dispense Refill  . amLODipine (NORVASC) 5 MG tablet Take 1 tablet (5 mg total) by mouth daily. 30 tablet 3  . BYSTOLIC 10 MG tablet TAKE 2 TABLETS BY MOUTH EVERY DAY 60 tablet 1  . citalopram (CELEXA) 20 MG tablet Take 1 tablet (20 mg total) by mouth daily. 30 tablet 1  . cyclobenzaprine (FLEXERIL) 10 MG tablet Take 1 tablet (10 mg total) by mouth 3 (three) times daily as needed for muscle spasms. 20 tablet 0  . doxycycline (VIBRA-TABS) 100 MG tablet Take 1 tablet (100  mg total) by mouth 2 (two) times daily. 20 tablet 0  . HYDROcodone-acetaminophen (NORCO/VICODIN) 5-325 MG tablet Take 2 tablets by mouth every 4 (four) hours as needed. 10 tablet 0  . ibuprofen (ADVIL,MOTRIN) 600 MG tablet Take 1 tablet (600 mg total) by mouth every 6 (six) hours as needed. (Patient taking differently: Take 600 mg by mouth every 6 (six) hours as needed (pain). ) 30 tablet 0  . losartan (COZAAR) 100 MG tablet TAKE 1 TABLET (100 MG TOTAL) BY MOUTH DAILY. 30 tablet 3  . metFORMIN (GLUCOPHAGE-XR) 500 MG 24 hr tablet Take 500 mg by mouth daily with breakfast.    . ondansetron (ZOFRAN) 4 MG tablet Take 1 tablet (4 mg total) by mouth every 6 (six) hours. 12 tablet 0  . triamterene-hydrochlorothiazide (MAXZIDE-25) 37.5-25 MG per tablet Take 1 tablet by mouth  every morning. 30 tablet 3  . ergocalciferol (VITAMIN D2) 50000 UNITS capsule Take 1 capsule (50,000 Units total) by mouth once a week. (Patient not taking: Reported on 06/07/2015) 4 capsule 4   No current facility-administered medications on file prior to visit.    BP 130/84 mmHg  Pulse 78  Temp(Src) 98.4 F (36.9 C) (Oral)  Ht 5\' 7"  (1.702 m)  Wt 323 lb (146.512 kg)  BMI 50.58 kg/m2  SpO2 96%  LMP 06/03/2015      Objective:   Physical Exam   General  Mental Status - Alert. General Appearance - Well groomed. Not in acute distress.  Skin Rashes- No Rashes.  HEENT Head- Normal. Ear Auditory Canal - Left- Normal. Right - Normal.Tympanic Membrane- Left- Normal. Right- Normal. Eye Sclera/Conjunctiva- Left- Normal. Right- Normal. Nose & Sinuses Nasal Mucosa- Left-  Not boggy or Congested. Right-  Not  boggy or Congested. Mouth & Throat Lips: Upper Lip- Normal: no dryness, cracking, pallor, cyanosis, or vesicular eruption. Lower Lip-Normal: no dryness, cracking, pallor, cyanosis or vesicular eruption. Buccal Mucosa- Bilateral- No Aphthous ulcers. Oropharynx- No Discharge or Erythema. Tonsils: Characteristics- Bilateral- No Erythema or Congestion. Size/Enlargement- Bilateral- No enlargement. Discharge- bilateral-None.  Neck Neck- Supple. No Masses.   Chest and Lung Exam Auscultation: Breath Sounds:- even and unlabored, but bilateral upper lobe rhonchi with some expiratory wheezing.  Cardiovascular Auscultation:Rythm- Regular, rate and rhythm. Murmurs & Other Heart Sounds:Ausculatation of the heart reveal- No Murmurs.  Lymphatic Head & Neck General Head & Neck Lymphatics: Bilateral: Description- No Localized lymphadenopathy.      Assessment & Plan:  For flu syndrome rx tamiflu.  For wheezing rx qvar and albuterol.  For cough hycodan  For bronchitis following the flu rx doxycycline  If chest congestion worsens or wheezing worsens notify us and will get  cxr.  Work not until Monday.  Follow up in 7 days or as needed

## 2015-06-08 ENCOUNTER — Ambulatory Visit: Payer: Self-pay | Admitting: Family Medicine

## 2015-06-11 ENCOUNTER — Ambulatory Visit (INDEPENDENT_AMBULATORY_CARE_PROVIDER_SITE_OTHER): Payer: 59 | Admitting: Medical

## 2015-06-11 ENCOUNTER — Encounter: Payer: Self-pay | Admitting: Medical

## 2015-06-11 ENCOUNTER — Ambulatory Visit (HOSPITAL_BASED_OUTPATIENT_CLINIC_OR_DEPARTMENT_OTHER)
Admission: RE | Admit: 2015-06-11 | Discharge: 2015-06-11 | Disposition: A | Discharge status: Home / Self Care | Payer: 59 | Source: Ambulatory Visit | Attending: Medical | Admitting: Medical

## 2015-06-11 VITALS — BP 126/86 | HR 87 | Temp 98.1°F | Ht 67.0 in | Wt 318.0 lb

## 2015-06-11 DIAGNOSIS — R05 Cough: Secondary | ICD-10-CM | POA: Insufficient documentation

## 2015-06-11 DIAGNOSIS — R059 Cough, unspecified: Secondary | ICD-10-CM

## 2015-06-11 DIAGNOSIS — R062 Wheezing: Secondary | ICD-10-CM | POA: Diagnosis not present

## 2015-06-11 DIAGNOSIS — J209 Acute bronchitis, unspecified: Secondary | ICD-10-CM | POA: Diagnosis not present

## 2015-06-11 DIAGNOSIS — J45909 Unspecified asthma, uncomplicated: Secondary | ICD-10-CM

## 2015-06-11 DIAGNOSIS — R7303 Prediabetes: Secondary | ICD-10-CM

## 2015-06-11 DIAGNOSIS — J111 Influenza due to unidentified influenza virus with other respiratory manifestations: Secondary | ICD-10-CM | POA: Diagnosis not present

## 2015-06-11 MED ORDER — PREDNISONE 20 MG PO TABS: ORAL_TABLET | ORAL | Status: DC

## 2015-06-11 MED ORDER — AZITHROMYCIN 250 MG PO TABS: ORAL_TABLET | ORAL | Status: DC

## 2015-06-11 MED ORDER — FLUTICASONE PROPIONATE HFA 110 MCG/ACT IN AERO: 2.0000 | INHALATION_SPRAY | Freq: Two times a day (BID) | RESPIRATORY_TRACT | Status: DC

## 2015-06-11 NOTE — Progress Notes (Signed)
Subjective:    Patient ID: Jennifer Rich, female    DOB: May 24, 1965, 50 y.o.   MRN: [id number]  HPI  Pt in for follow up.  Had flu like syndrome, wheezing, and cough.    Pt had HA this weekend and it is almost gone now. She attributed this HA to antibiotic. I had gave doxycycline. She thought the antibiotic gave her ha and decreased her appetite. So she stopped. These symptoms did get better.  Pt is on hycodan for cough. Pt is coughing up some mucous.  Pt has been using the albuterol But the qvar was too expensive. She was wheezing moderate this weekend and almost went to the ED.  Pt was still having the body aches over weekend. The body aches area getting better now.  lmp- last week.   Review of Systems  Constitutional: Positive for fever. Negative for chills and fatigue.  HENT: Positive for congestion. Negative for ear pain, postnasal drip, rhinorrhea, sinus pressure and sneezing.   Respiratory: Positive for cough and wheezing. Negative for chest tightness and shortness of breath.   Cardiovascular: Negative for chest pain and palpitations.  Gastrointestinal: Negative for abdominal pain.  Genitourinary: Negative for enuresis and dyspareunia.  Musculoskeletal: Positive for myalgias. Negative for back pain and joint swelling.  Skin: Negative for pallor and wound.  Neurological: Positive for headaches. Negative for dizziness.  Hematological: Negative for adenopathy. Does not bruise/bleed easily.  Psychiatric/Behavioral: Negative for behavioral problems and confusion.    Past Medical History  Diagnosis Date  . Hypertension   . Chicken pox as a child  . Hyperlipidemia   . Vitamin D deficiency   . Allergy   . Hyperglycemia   . Preventative health care 12/18/2012    Has seen Dr Carlota Raspberry for GYN in past  . Type II or unspecified type diabetes mellitus without mention of complication, not stated as uncontrolled   . Cervical cancer screening 01/25/2013  . Anxiety and  depression 01/30/2013  . Obesity, unspecified 04/26/2013  . Pneumonia 05/02/2013  . 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 Obesity   . CAP (community acquired pneumonia) 04/17/2015  . Thyroid disease     No per pt 05/25/15  . Goiter     Mildly enlarged thyroid TSH normal     Social History   Social History  . Marital Status: Married    Spouse Name: N/A  . Number of Children: 0  . Years of Education: N/A   Occupational History  . SALES [employer]    Social History Main Topics  . Smoking status: Never Smoker   . Smokeless tobacco: Never Used  . Alcohol Use: No  . Drug Use: No  . Sexual Activity:    Partners: Male     Comment: lives with husband and [place], [name], no dietary restrictions.   Other Topics Concern  . Not on file   Social History Narrative   No Pork          Past Surgical History  Procedure Laterality Date  . None    . Wisdom tooth extraction  50 yrs old    Family History  Problem Relation Age of Onset  . Heart attack Maternal Grandfather 75  . Heart disease Maternal Grandfather   . Diabetes Maternal Grandmother     type 2  . Blindness Maternal Grandmother   . Hypertension Paternal Grandmother   . Stroke Paternal Grandfather   . Hypertension Sister   .  Hypertension Sister     No Known Allergies  Current Outpatient Prescriptions on File Prior to Visit  Medication Sig Dispense Refill  . albuterol (PROVENTIL HFA;VENTOLIN HFA) 108 (90 Base) MCG/ACT inhaler Inhale 2 puffs into the lungs every 6 (six) hours as needed for wheezing or shortness of breath. 1 Inhaler 0  . amLODipine (NORVASC) 5 MG tablet Take 1 tablet (5 mg total) by mouth daily. 30 tablet 3  . beclomethasone (QVAR) 40 MCG/ACT inhaler Inhale 2 puffs into the lungs 2 (two) times daily. 1 Inhaler 1  . BYSTOLIC 10 MG tablet TAKE 2 TABLETS BY MOUTH EVERY DAY 60 tablet 1  . citalopram (CELEXA) 20 MG tablet Take 1 tablet (20 mg total) by mouth daily. 30 tablet 1   . cyclobenzaprine (FLEXERIL) 10 MG tablet Take 1 tablet (10 mg total) by mouth 3 (three) times daily as needed for muscle spasms. 20 tablet 0  . doxycycline (VIBRA-TABS) 100 MG tablet Take 1 tablet (100 mg total) by mouth 2 (two) times daily. 20 tablet 0  . ergocalciferol (VITAMIN D2) 50000 UNITS capsule Take 1 capsule (50,000 Units total) by mouth once a week. 4 capsule 4  . HYDROcodone-acetaminophen (NORCO/VICODIN) 5-325 MG tablet Take 2 tablets by mouth every 4 (four) hours as needed. 10 tablet 0  . HYDROcodone-homatropine (HYCODAN) 5-1.5 MG/5ML syrup Take 5 mLs by mouth every 8 (eight) hours as needed for cough. 120 mL 0  . ibuprofen (ADVIL,MOTRIN) 600 MG tablet Take 1 tablet (600 mg total) by mouth every 6 (six) hours as needed. (Patient taking differently: Take 600 mg by mouth every 6 (six) hours as needed (pain). ) 30 tablet 0  . losartan (COZAAR) 100 MG tablet TAKE 1 TABLET (100 MG TOTAL) BY MOUTH DAILY. 30 tablet 3  . metFORMIN (GLUCOPHAGE-XR) 500 MG 24 hr tablet Take 500 mg by mouth daily with breakfast.    . ondansetron (ZOFRAN) 4 MG tablet Take 1 tablet (4 mg total) by mouth every 6 (six) hours. 12 tablet 0  . oseltamivir (TAMIFLU) 75 MG capsule Take 1 capsule (75 mg total) by mouth 2 (two) times daily. 10 capsule 0  . triamterene-hydrochlorothiazide (MAXZIDE-25) 37.5-25 MG per tablet Take 1 tablet by mouth every morning. 30 tablet 3   No current facility-administered medications on file prior to visit.    BP 126/86 mmHg  Pulse 87  Temp(Src) 98.1 F (36.7 C) (Oral)  Ht 5\' 7"  (1.702 m)  Wt 318 lb (144.244 kg)  BMI 49.79 kg/m2  SpO2 98%  LMP 06/03/2015       Objective:   Physical Exam  General  Mental Status - Alert. General Appearance - Well groomed. Not in acute distress.  Skin Rashes- No Rashes.  HEENT Head- Normal. Ear Auditory Canal - Left- Normal. Right - Normal.Tympanic Membrane- Left- Normal. Right- Normal. Eye Sclera/Conjunctiva- Left- Normal. Right-  Normal. Nose & Sinuses Nasal Mucosa- Left-  Boggy and Congested. Right-  Boggy and  Congested.Bilateral no  maxillary and  No frontal sinus pressure. Mouth & Throat Lips: Upper Lip- Normal: no dryness, cracking, pallor, cyanosis, or vesicular eruption. Lower Lip-Normal: no dryness, cracking, pallor, cyanosis or vesicular eruption. Buccal Mucosa- Bilateral- No Aphthous ulcers. Oropharynx- No Discharge or Erythema. Tonsils: Characteristics- Bilateral- No Erythema or Congestion. Size/Enlargement- Bilateral- No enlargement. Discharge- bilateral-None.  Neck Neck- Supple. No Masses.   Chest and Lung Exam Auscultation: Breath Sounds:-even and unlabored. Mild shallow breathing with faint expiratory wheezing at the base of lungs.  Cardiovascular Auscultation:Rythm- Regular, rate and  rhythm. Murmurs & Other Heart Sounds:Ausculatation of the heart reveal- No Murmurs.  Lymphatic Head & Neck General Head & Neck Lymphatics: Bilateral: Description- No Localized lymphadenopathy.       Assessment & Plan:  You had flu syndrome recently . Continue tamiflu.  For bronchitis rx azithromycin. Stop doxycycline due to possible side effect.  For cough continue hycodan.  For wheezing will rx flovent. Rx tapered dose prednisone. Use albuterol if needed  Will get cbc, cmp, a1-c and cxr today.  Follow up in 7 days or as needed.

## 2015-06-11 NOTE — Patient Instructions (Addendum)
You had flu syndrome recently. Continue tamiflu.  For bronchitis rx azithromycin. Stop doxycycline due to possible side effect.  For cough continue hycodan.  For wheezing will rx flovent. Rx tapered dose prednisone. Use albuterol if needed.  Will get cbc, cmp, a1-c and cxr today.  Follow up in 7 days or as needed.

## 2015-06-11 NOTE — Progress Notes (Signed)
Pre visit review using our clinic review tool, if applicable. No additional management support is needed unless otherwise documented below in the visit note. 

## 2015-06-12 LAB — CBC WITH DIFFERENTIAL/PLATELET
BASOS PCT: 1 % (ref 0.0–3.0)
Basophils Absolute: 0.1 10*3/uL (ref 0.0–0.1)
EOS PCT: 2.6 % (ref 0.0–5.0)
Eosinophils Absolute: 0.1 10*3/uL (ref 0.0–0.7)
HCT: 41.5 % (ref 36.0–46.0)
Hemoglobin: 13.7 g/dL (ref 12.0–15.0)
LYMPHS PCT: 49.4 % — AB (ref 12.0–46.0)
Lymphs Abs: 2.8 10*3/uL (ref 0.7–4.0)
MCHC: 32.9 g/dL (ref 30.0–36.0)
MCV: 75.6 fl — ABNORMAL LOW (ref 78.0–100.0)
MONO ABS: 0.4 10*3/uL (ref 0.1–1.0)
MONOS PCT: 6.7 % (ref 3.0–12.0)
NEUTROS ABS: 2.3 10*3/uL (ref 1.4–7.7)
NEUTROS PCT: 40.3 % — AB (ref 43.0–77.0)
Platelets: 241 10*3/uL (ref 150.0–400.0)
RBC: 5.49 Mil/uL — AB (ref 3.87–5.11)
RDW: 15.3 % (ref 11.5–15.5)
WBC: 5.8 10*3/uL (ref 4.0–10.5)

## 2015-06-12 LAB — HEMOGLOBIN A1C: HEMOGLOBIN A1C: 6.5 % (ref 4.6–6.5)

## 2015-06-12 LAB — COMPREHENSIVE METABOLIC PANEL
ALT: 7 U/L (ref 0–35)
AST: 17 U/L (ref 0–37)
Albumin: 3.9 g/dL (ref 3.5–5.2)
Alkaline Phosphatase: 84 U/L (ref 39–117)
BILIRUBIN TOTAL: 0.8 mg/dL (ref 0.2–1.2)
BUN: 15 mg/dL (ref 6–23)
CALCIUM: 9.6 mg/dL (ref 8.4–10.5)
CHLORIDE: 105 meq/L (ref 96–112)
CO2: 30 meq/L (ref 19–32)
Creatinine, Ser: 0.9 mg/dL (ref 0.40–1.20)
GFR: 85.5 mL/min (ref >60.00)
GLUCOSE: 88 mg/dL (ref 70–99)
POTASSIUM: 3.4 meq/L — AB (ref 3.5–5.1)
Sodium: 141 mEq/L (ref 135–145)
Total Protein: 8 g/dL (ref 6.0–8.3)

## 2015-06-13 ENCOUNTER — Encounter: Payer: Self-pay | Admitting: Medical

## 2015-06-13 ENCOUNTER — Ambulatory Visit (INDEPENDENT_AMBULATORY_CARE_PROVIDER_SITE_OTHER): Payer: 59 | Admitting: Medical

## 2015-06-13 VITALS — BP 160/105 | HR 91 | Temp 97.6°F | Ht 67.0 in | Wt 315.4 lb

## 2015-06-13 DIAGNOSIS — I10 Essential (primary) hypertension: Secondary | ICD-10-CM

## 2015-06-13 DIAGNOSIS — I517 Cardiomegaly: Secondary | ICD-10-CM

## 2015-06-13 DIAGNOSIS — E876 Hypokalemia: Secondary | ICD-10-CM | POA: Diagnosis not present

## 2015-06-13 DIAGNOSIS — R062 Wheezing: Secondary | ICD-10-CM

## 2015-06-13 LAB — COMPREHENSIVE METABOLIC PANEL
ALT: 10 U/L (ref 0–35)
AST: 18 U/L (ref 0–37)
Albumin: 4.1 g/dL (ref 3.5–5.2)
Alkaline Phosphatase: 78 U/L (ref 39–117)
BILIRUBIN TOTAL: 1 mg/dL (ref 0.2–1.2)
BUN: 17 mg/dL (ref 6–23)
CALCIUM: 9.9 mg/dL (ref 8.4–10.5)
CHLORIDE: 103 meq/L (ref 96–112)
CO2: 28 meq/L (ref 19–32)
Creatinine, Ser: 1.06 mg/dL (ref 0.40–1.20)
GFR: 70.78 mL/min (ref >60.00)
Glucose, Bld: 149 mg/dL — ABNORMAL HIGH (ref 70–99)
Potassium: 3.4 mEq/L — ABNORMAL LOW (ref 3.5–5.1)
SODIUM: 139 meq/L (ref 135–145)
Total Protein: 8.5 g/dL — ABNORMAL HIGH (ref 6.0–8.3)

## 2015-06-13 LAB — BRAIN NATRIURETIC PEPTIDE: Pro B Natriuretic peptide (BNP): 58 pg/mL (ref 0.0–100.0)

## 2015-06-13 NOTE — Patient Instructions (Addendum)
Your wheezing post flu syndrome has improved since use of prednisone. Continue the antibiotic, hycodan and inhalers.  Your bp is elevated today. But you missed bystolic today. When you get home resume you meds/take early than you typically do. Get bp cuff otc and check bp later tonight and tomorrow am. I want to know what readings you get before long holiday weekend. If you have any neurologic or cardiac symptoms then ED evaluation.  Will get bnp today. See if numbers elevated. I don't think presently have chf but will follow bnp numbers. Important for long term to keep bp controlled.  Follow up in 4-10 days or as needed.

## 2015-06-13 NOTE — Progress Notes (Signed)
Subjective:    Patient ID: Jennifer Rich, female    DOB: 1965/07/28, 50 y.o.   MRN: [id number]  HPI  Pt in for follow up. Pt states her breathing is much better and less wheezing. Pt has less nasal congestion. Overall feels better. Pt  Has less cough. Breathing better.   I wanted her to follow up since cxr stated mild cardiomelegaly.Xray showed finding suggestive mild chf.  Pt bp is elevated today. Pt is on amlodipine, bystolic and cozaar. Pt did not take bystolic this am.  Pt reports no cardiac or neurologic signs or symptoms. Pt is on losartan, amlodipine and bystollic.   Pt states in past some cramps of muscles on diuretics.     Review of Systems  Constitutional: Negative for fever, chills, diaphoresis, activity change and fatigue.  HENT: Positive for congestion.   Respiratory: Positive for cough. Negative for chest tightness and shortness of breath.   Cardiovascular: Negative for chest pain, palpitations and leg swelling.  Gastrointestinal: Negative for nausea, vomiting and abdominal pain.  Musculoskeletal: Negative for myalgias, neck pain and neck stiffness.  Neurological: Negative for dizziness, seizures, syncope, facial asymmetry, weakness, numbness and headaches.  Psychiatric/Behavioral: Negative for behavioral problems, confusion and agitation. The patient is not nervous/anxious.     Past Medical History  Diagnosis Date  . Hypertension   . Chicken pox as a child  . Hyperlipidemia   . Vitamin D deficiency   . Allergy   . Hyperglycemia   . Preventative health care 12/18/2012    Has seen Dr Carlota Raspberry for GYN in past  . Type II or unspecified type diabetes mellitus without mention of complication, not stated as uncontrolled   . Cervical cancer screening 01/25/2013  . Anxiety and depression 01/30/2013  . Obesity, unspecified 04/26/2013  . Pneumonia 05/02/2013  . 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 Obesity   . CAP (community  acquired pneumonia) 04/17/2015  . Thyroid disease     No per pt 05/25/15  . Goiter     Mildly enlarged thyroid TSH normal     Social History   Social History  . Marital Status: Married    Spouse Name: N/A  . Number of Children: 0  . Years of Education: N/A   Occupational History  . SALES [employer]    Social History Main Topics  . Smoking status: Never Smoker   . Smokeless tobacco: Never Used  . Alcohol Use: No  . Drug Use: No  . Sexual Activity:    Partners: Male     Comment: lives with husband and [place], [name], no dietary restrictions.   Other Topics Concern  . Not on file   Social History Narrative   No Pork          Past Surgical History  Procedure Laterality Date  . None    . Wisdom tooth extraction  50 yrs old    Family History  Problem Relation Age of Onset  . Heart attack Maternal Grandfather 75  . Heart disease Maternal Grandfather   . Diabetes Maternal Grandmother     type 2  . Blindness Maternal Grandmother   . Hypertension Paternal Grandmother   . Stroke Paternal Grandfather   . Hypertension Sister   . Hypertension Sister     No Known Allergies  Current Outpatient Prescriptions on File Prior to Visit  Medication Sig Dispense Refill  . albuterol (PROVENTIL HFA;VENTOLIN HFA) 108 (90 Base) MCG/ACT inhaler Inhale 2  puffs into the lungs every 6 (six) hours as needed for wheezing or shortness of breath. 1 Inhaler 0  . amLODipine (NORVASC) 5 MG tablet Take 1 tablet (5 mg total) by mouth daily. 30 tablet 3  . BYSTOLIC 10 MG tablet TAKE 2 TABLETS BY MOUTH EVERY DAY 60 tablet 1  . ergocalciferol (VITAMIN D2) 50000 UNITS capsule Take 1 capsule (50,000 Units total) by mouth once a week. 4 capsule 4  . predniSONE (DELTASONE) 20 MG tablet 3 tab po day 1 and day 2, then 2 tab po day 3 and 4, then 1 tab po day 5 and day 6. 12 tablet 0  . cyclobenzaprine (FLEXERIL) 10 MG tablet Take 1 tablet (10 mg total) by mouth 3 (three) times daily as needed for muscle  spasms. (Patient not taking: Reported on 06/13/2015) 20 tablet 0  . fluticasone (FLOVENT HFA) 110 MCG/ACT inhaler Inhale 2 puffs into the lungs 2 (two) times daily at 10 AM and 5 PM. (Patient not taking: Reported on 06/13/2015) 1 Inhaler 2  . HYDROcodone-acetaminophen (NORCO/VICODIN) 5-325 MG tablet Take 2 tablets by mouth every 4 (four) hours as needed. (Patient not taking: Reported on 06/13/2015) 10 tablet 0  . HYDROcodone-homatropine (HYCODAN) 5-1.5 MG/5ML syrup Take 5 mLs by mouth every 8 (eight) hours as needed for cough. (Patient not taking: Reported on 06/13/2015) 120 mL 0  . ibuprofen (ADVIL,MOTRIN) 600 MG tablet Take 1 tablet (600 mg total) by mouth every 6 (six) hours as needed. (Patient not taking: Reported on 06/13/2015) 30 tablet 0  . losartan (COZAAR) 100 MG tablet TAKE 1 TABLET (100 MG TOTAL) BY MOUTH DAILY. 30 tablet 3  . metFORMIN (GLUCOPHAGE-XR) 500 MG 24 hr tablet Take 500 mg by mouth daily with breakfast. Reported on 06/13/2015    . ondansetron (ZOFRAN) 4 MG tablet Take 1 tablet (4 mg total) by mouth every 6 (six) hours. (Patient not taking: Reported on 06/13/2015) 12 tablet 0  . triamterene-hydrochlorothiazide (MAXZIDE-25) 37.5-25 MG per tablet Take 1 tablet by mouth every morning. (Patient not taking: Reported on 06/13/2015) 30 tablet 3   No current facility-administered medications on file prior to visit.    BP 160/105 mmHg  Pulse 91  Temp(Src) 97.6 F (36.4 C) (Oral)  Ht 5\' 7"  (1.702 m)  Wt 315 lb 6.4 oz (143.065 kg)  BMI 49.39 kg/m2  SpO2 92%  LMP 06/03/2015       Objective:   Physical Exam  General Mental Status- Alert. General Appearance- Not in acute distress.   Skin General: Color- Normal Color. Moisture- Normal Moisture.  Neck Carotid Arteries- Normal color. Moisture- Normal Moisture. No carotid bruits. No JVD.  Chest and Lung Exam Auscultation: Breath Sounds:-Normal. Clearer and deeper.  Cardiovascular Auscultation:Rythm- Regular. Murmurs & Other  Heart Sounds:Auscultation of the heart reveals- No Murmurs.  Abdomen Inspection:-Inspeection Normal. Palpation/Percussion:Note:No mass. Palpation and Percussion of the abdomen reveal- Non Tender, Non Distended + BS, no rebound or guarding.    Neurologic Cranial Nerve exam:- CN III-XII intact(No nystagmus), symmetric smile. Strength:- 5/5 equal and symmetric strength both upper and lower extremities.  Lower ext- no pedal edema. Neg homans signs.      Assessment & Plan:  Your wheezing post flu syndrome has improved since use of prednisone. Continue the antibiotic, hycodan and inhalers.  Your bp is elevated today. But you missed bystolic today. When you get home resume you meds/take early than you typically do. Get bp cuff otc and check bp later tonight and tomorrow am. I want  to know what readings you get before long holiday weekend. If you have any neurologic or cardiac symptoms then ED evaluation.  Will get bnp today. See if numbers elevated. I don't think presently have chf but will follow bnp numbers. Important for long term to keep bp controlled.  Follow up in 4-10 days or as needed.

## 2015-06-19 ENCOUNTER — Telehealth: Payer: Self-pay | Admitting: Medical

## 2015-06-19 NOTE — Telephone Encounter (Signed)
Left message for pt to call and set a blood pressure follow up and we could also feel her FMLA paperwork out as well when she was in the office.

## 2015-06-19 NOTE — Telephone Encounter (Signed)
Pt has an appointment 06/20/15.

## 2015-06-19 NOTE — Telephone Encounter (Signed)
I can fill out the form but not sure when I will be able to get it done by?  But she also needs to have follow up on her blood pressure. Her blood pressure was high on last visit. Would you schedule her appointment for next Wednesday bp check. Schedule her 30 minute slot. Will afford me time to fill out form in light of fact that I won't be in town later this week or early next week.

## 2015-06-20 ENCOUNTER — Encounter: Payer: Self-pay | Admitting: Medical

## 2015-06-20 ENCOUNTER — Ambulatory Visit (INDEPENDENT_AMBULATORY_CARE_PROVIDER_SITE_OTHER): Payer: 59 | Admitting: Medical

## 2015-06-20 VITALS — BP 139/85 | HR 71 | Temp 98.0°F | Ht 67.0 in | Wt 320.0 lb

## 2015-06-20 DIAGNOSIS — J111 Influenza due to unidentified influenza virus with other respiratory manifestations: Secondary | ICD-10-CM

## 2015-06-20 DIAGNOSIS — I10 Essential (primary) hypertension: Secondary | ICD-10-CM

## 2015-06-20 NOTE — Progress Notes (Signed)
Subjective:    Patient ID: Jennifer Rich, female    DOB: [birth year], 50 y.o.   MRN: [id number]  HPI   Pt in for follow up. Pt is no longer wheezing. She had this post flu. I gave prednisone and she got better. Also recovered from bronchitis  Pt on work up for above found to have stable cardiomegaly but bnp was low.  Pt bp was elevated on last visit. But now that she is on her bp meds her bp is well controlled.   Pt needed until 17 the wheezing to resolve completely.   Pt is now eating much healthier since last visit and avoiding caffeine.   Review of Systems  Constitutional: Negative for fever, chills and fatigue.  Respiratory: Negative for cough, chest tightness, shortness of breath and wheezing.   Cardiovascular: Negative for chest pain and palpitations.  Gastrointestinal: Negative for nausea, abdominal pain, diarrhea and constipation.  Genitourinary: Negative for dysuria.  Musculoskeletal: Negative for back pain, gait problem and neck stiffness.  Neurological: Negative for dizziness, speech difficulty, weakness and headaches.  Hematological: Negative for adenopathy. Does not bruise/bleed easily.  Psychiatric/Behavioral: Negative for behavioral problems, confusion, sleep disturbance and dysphoric mood. The patient is not nervous/anxious.     Past Medical History  Diagnosis Date  . Hypertension   . Chicken pox as a child  . Hyperlipidemia   . Vitamin D deficiency   . Allergy   . Hyperglycemia   . Preventative health care 12/18/2012    Has seen Dr Jennifer Rich for GYN in past  . Type II or unspecified type diabetes mellitus without mention of complication, not stated as uncontrolled   . Cervical cancer screening 01/25/2013  . Anxiety and depression 01/30/2013  . Obesity, unspecified 04/26/2013  . Pneumonia 05/02/2013  . 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 Obesity   . CAP (community acquired pneumonia) 04/17/2015  . Thyroid disease    No per pt 05/25/15  . Goiter     Mildly enlarged thyroid TSH normal      Social History   Social History  . Marital Status: Married    Spouse Name: N/A  . Number of Children: 0  . Years of Education: N/A   Occupational History  . SALES [employer]    Social History Main Topics  . Smoking status: Never Smoker   . Smokeless tobacco: Never Used  . Alcohol Use: No  . Drug Use: No  . Sexual Activity:    Partners: Male     Comment: lives with husband and Jennifer Rich, Jennifer Rich, no dietary restrictions.   Other Topics Concern  . Not on file   Social History Narrative   No Pork          Past Surgical History  Procedure Laterality Date  . None    . Wisdom tooth extraction  50 yrs old    Family History  Problem Relation Age of Onset  . Heart attack Maternal Grandfather 75  . Heart disease Maternal Grandfather   . Diabetes Maternal Grandmother     type 2  . Blindness Maternal Grandmother   . Hypertension Paternal Grandmother   . Stroke Paternal Grandfather   . Hypertension Sister   . Hypertension Sister     No Known Allergies  Current Outpatient Prescriptions on File Prior to Visit  Medication Sig Dispense Refill  . albuterol (PROVENTIL HFA;VENTOLIN HFA) 108 (90 Base) MCG/ACT inhaler Inhale 2 puffs into the lungs every 6 (  six) hours as needed for wheezing or shortness of breath. 1 Inhaler 0  . amLODipine (NORVASC) 5 MG tablet Take 1 tablet (5 mg total) by mouth daily. 30 tablet 3  . BYSTOLIC 10 MG tablet TAKE 2 TABLETS BY MOUTH EVERY DAY 60 tablet 1  . ergocalciferol (VITAMIN D2) 50000 UNITS capsule Take 1 capsule (50,000 Units total) by mouth once a week. 4 capsule 4  . losartan (COZAAR) 100 MG tablet TAKE 1 TABLET (100 MG TOTAL) BY MOUTH DAILY. 30 tablet 3  . metFORMIN (GLUCOPHAGE-XR) 500 MG 24 hr tablet Take 500 mg by mouth daily with breakfast. Reported on 06/13/2015    . ondansetron (ZOFRAN) 4 MG tablet Take 1 tablet (4 mg total) by mouth every 6 (six) hours. 12 tablet 0   . predniSONE (DELTASONE) 20 MG tablet 3 tab po day 1 and day 2, then 2 tab po day 3 and 4, then 1 tab po day 5 and day 6. 12 tablet 0   No current facility-administered medications on file prior to visit.    BP 126/84 mmHg  Pulse 71  Temp(Src) 98 F (36.7 C) (Oral)  Ht 5\' 7"  (1.702 m)  Wt 320 lb (145.151 kg)  BMI 50.11 kg/m2  SpO2 98%  LMP 06/03/2015       Objective:   Physical Exam  General Mental Status- Alert. General Appearance- Not in acute distress.   Skin General: Color- Normal Color. Moisture- Normal Moisture.  Neck Carotid Arteries- Normal color. Moisture- Normal Moisture. No carotid bruits. No JVD.  Chest and Lung Exam Auscultation: Breath Sounds:-Normal. CTA.  Cardiovascular Auscultation:Rythm- Regular, rate and rhythm. Murmurs & Other Heart Sounds:Auscultation of the heart reveals- No Murmurs.  Abdomen Inspection:-Inspeection Normal. Palpation/Percussion:Note:No mass. Palpation and Percussion of the abdomen reveal- Non Tender, Non Distended + BS, no rebound or guarding.    Neurologic Cranial Nerve exam:- CN III-XII intact(No nystagmus), symmetric smile. Strength:- 5/5 equal and symmetric strength both upper and lower extremities.  Lower ext-no pedal edema.     Assessment & Plan:  Your flu syndrome and secondary complications have resolved.  Your bp is good today.I got little higher than MA. But still it is within normal limits. Stay on bp. No skipped doses.  Good job on diet changes recently. Would also recommend getting daily exercise. Walking 9,000 steps a day.  Follow up as regularly scheduled. Filled out your forms today as well.  Jennifer Rich, Jennifer Miller, PA-C

## 2015-06-20 NOTE — Patient Instructions (Signed)
Your flu syndrome and secondary complications have resolved.  Your bp is good today.I got little higher than MA. But still it is within normal limits. Stay on bp. No skipped doses.  Good job on diet changes recently. Would also recommend getting daily exercise. Walking 9,000 steps a day.  Follow up as regularly scheduled. Filled out your forms today as well.

## 2015-06-20 NOTE — Progress Notes (Signed)
Pre visit review using our clinic review tool, if applicable. No additional management support is needed unless otherwise documented below in the visit note. 

## 2015-07-17 ENCOUNTER — Ambulatory Visit: Payer: Self-pay | Admitting: Family Medicine

## 2015-08-09 ENCOUNTER — Ambulatory Visit: Payer: Self-pay | Admitting: Family Medicine

## 2015-08-28 ENCOUNTER — Ambulatory Visit: Payer: Self-pay | Admitting: Family Medicine

## 2015-08-28 DIAGNOSIS — Z0289 Encounter for other administrative examinations: Secondary | ICD-10-CM

## 2015-08-31 ENCOUNTER — Telehealth: Payer: Self-pay | Admitting: Family Medicine

## 2015-08-31 NOTE — Telephone Encounter (Signed)
Patient was No Show 6/27. Previous No Show on 01/17/2015 and 08/07/2014. Charge or No Charge?

## 2015-08-31 NOTE — Telephone Encounter (Signed)
charge 

## 2015-09-03 ENCOUNTER — Encounter: Payer: Self-pay | Admitting: Family Medicine

## 2015-11-15 MED FILL — DOXYCYCLINE 100 MG TABLET: 100 | 10 days supply | Qty: 20 | Fill #0

## 2015-12-30 ENCOUNTER — Other Ambulatory Visit: Payer: Self-pay | Admitting: Family Medicine

## 2016-01-08 ENCOUNTER — Other Ambulatory Visit: Payer: Self-pay | Admitting: Family Medicine

## 2016-01-08 MED ORDER — NEBIVOLOL HCL 10 MG PO TABS: 20.0000 mg | ORAL_TABLET | Freq: Every day | ORAL | 0 refills | Status: DC

## 2016-01-08 MED ORDER — AMLODIPINE BESYLATE 5 MG PO TABS: 5.0000 mg | ORAL_TABLET | Freq: Every day | ORAL | 0 refills | Status: DC

## 2016-05-03 ENCOUNTER — Other Ambulatory Visit: Payer: Self-pay | Admitting: Family Medicine

## 2016-05-12 ENCOUNTER — Other Ambulatory Visit: Payer: Self-pay | Admitting: Family Medicine

## 2016-05-12 NOTE — Telephone Encounter (Signed)
Patient needs cpe  Last one 10/2014  thx  PC

## 2016-05-14 NOTE — Telephone Encounter (Signed)
Patient currently lives in [place] and states she will check her schedule and call back to schedule.

## 2016-06-11 IMAGING — CT CT HEAD W/O CM
2 series · 15 of 30 positions shown, 17 images · non-contrast
Comparison: Head CT 11/20/2008.

CLINICAL DATA: 49-year-old female with history of chest pain for 1
week with associated left arm and left leg numbness. Diaphoresis.
Tongue numbness.

EXAM:
CT HEAD WITHOUT CONTRAST
TECHNIQUE: Contiguous axial images were obtained from the base of the skull
through the vertex without intravenous contrast.

[Series 2: head without · axial · non-contrast · 0.42mm/px · z∈[-107,+8]mm · 7 of 31 slices shown, 9 images]
[im 4/31  brain]
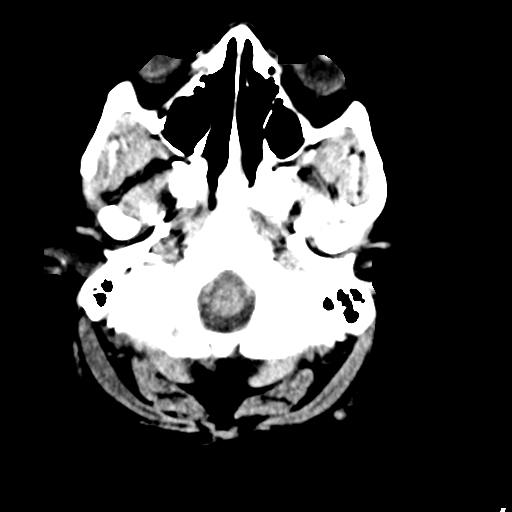
[im 4/31  bone]
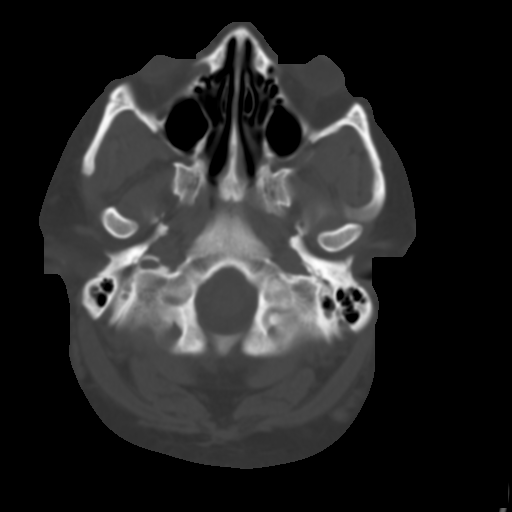
[im 8/31  brain]
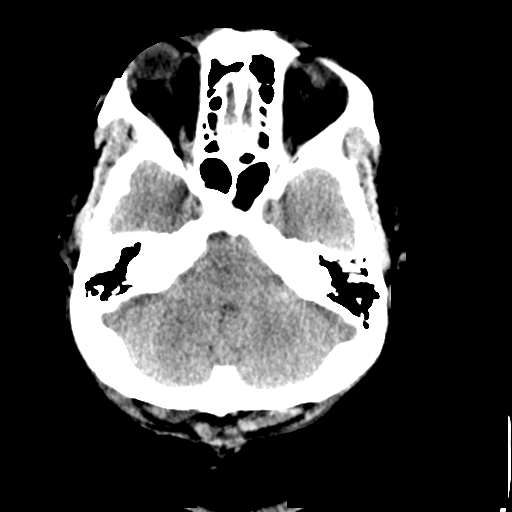
[im 12/31  brain]
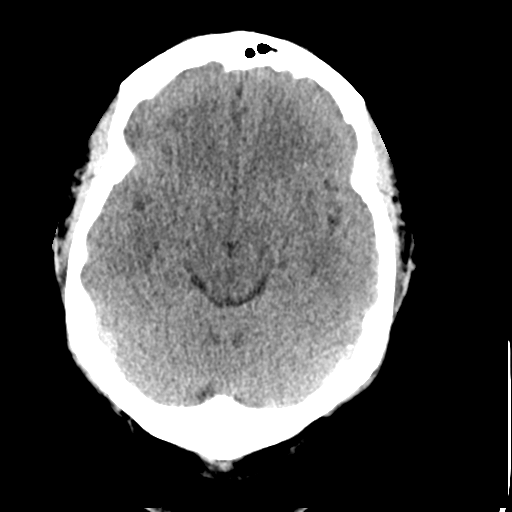
[im 16/31  brain]
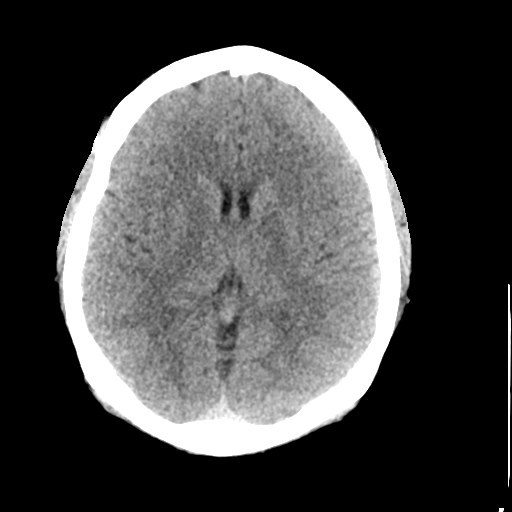
[im 19/31  brain]
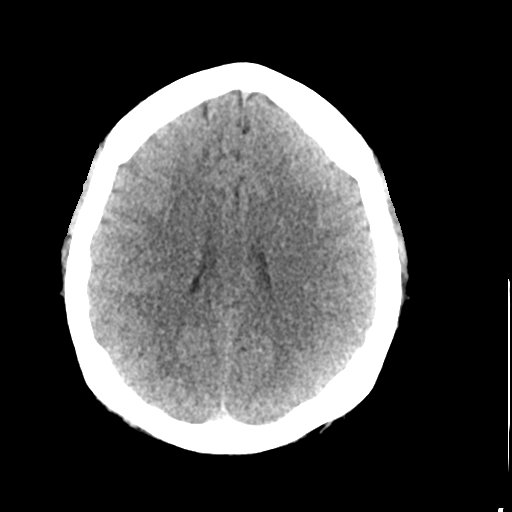
[im 19/31  bone]
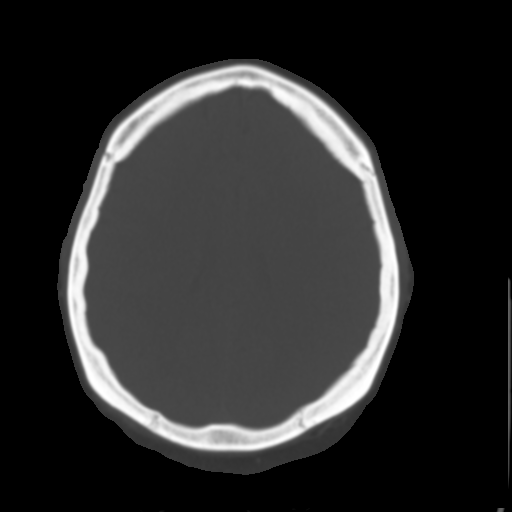
[im 23/31  brain]
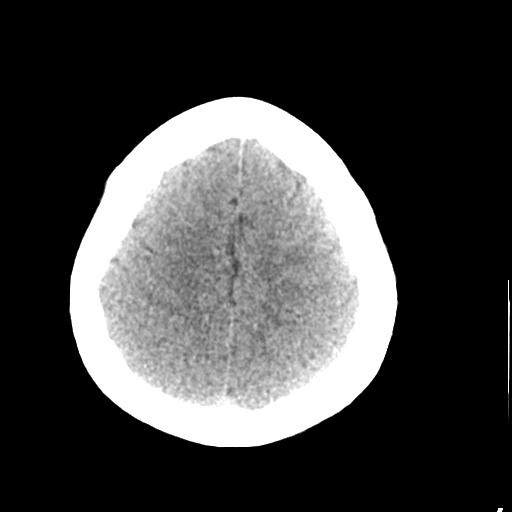
[im 27/31  brain]
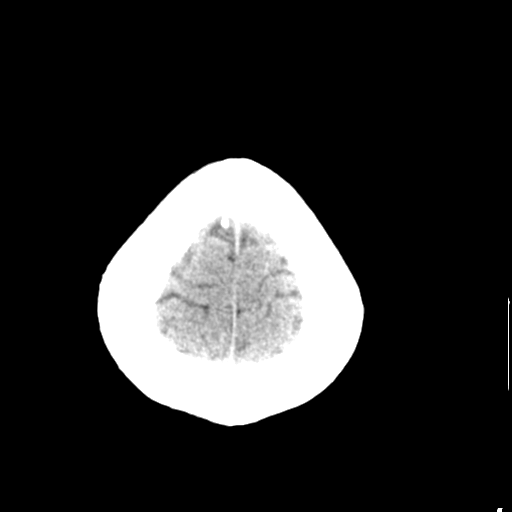

[Series 3: head bone · axial · 0.42mm/px · z∈[-108,+16]mm · 8 of 78 slices shown]
[im 8/78  bone]
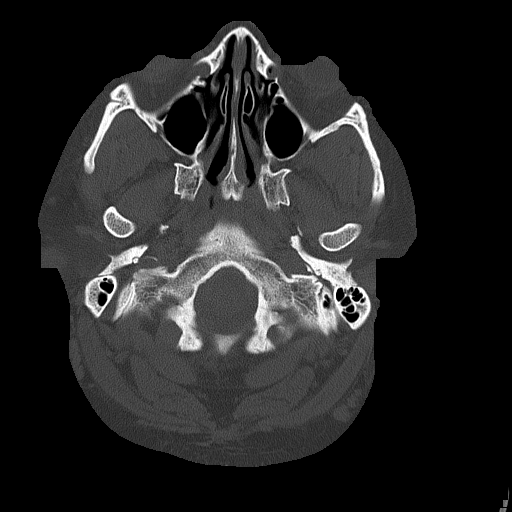
[im 16/78  bone]
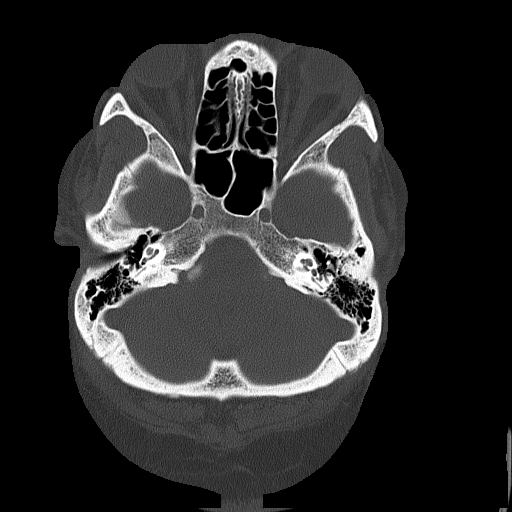
[im 24/78  bone]
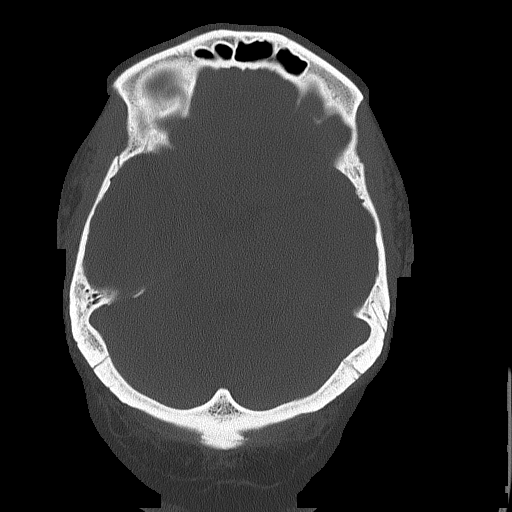
[im 35/78  bone]
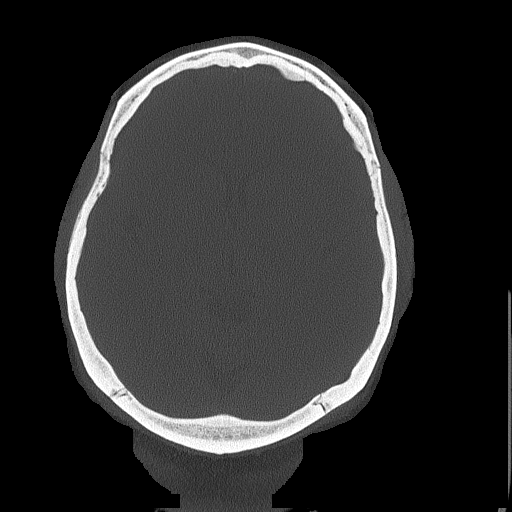
[im 43/78  bone]
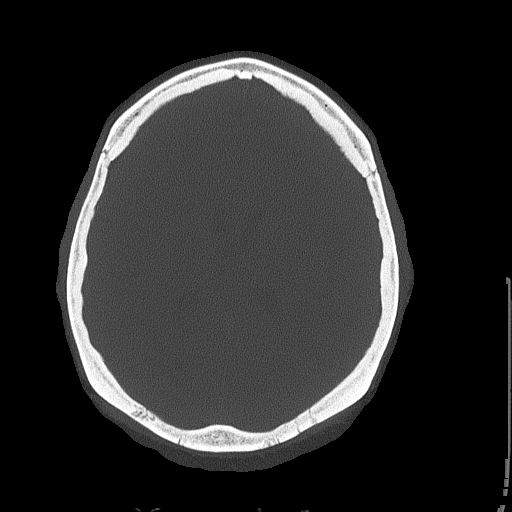
[im 54/78  bone]
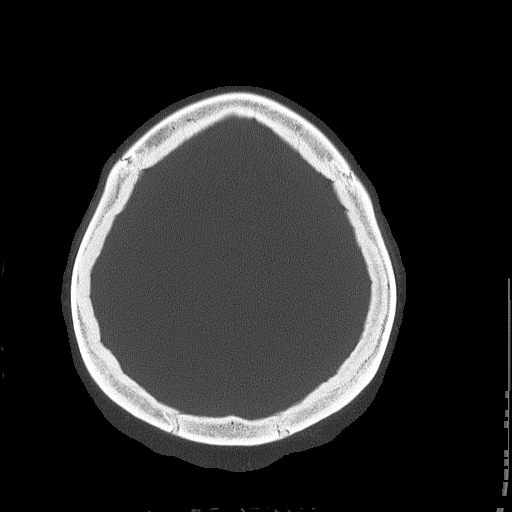
[im 62/78  bone]
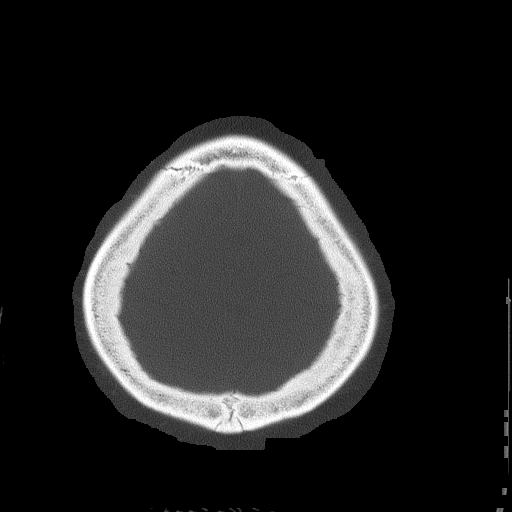
[im 70/78  bone]
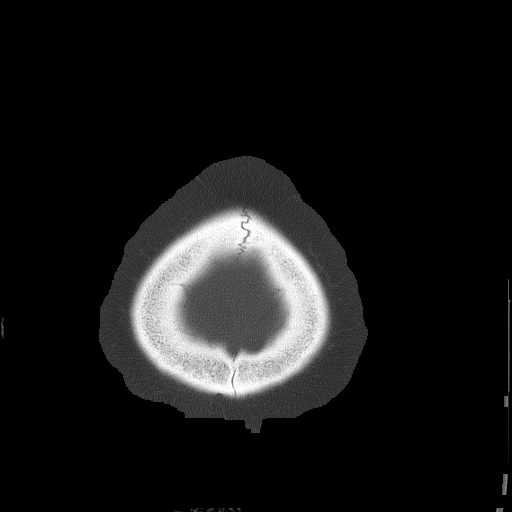

[15 of 30 positions shown; findings below may reference images not displayed]

FINDINGS: No acute intracranial abnormalities. Specifically, no evidence of
acute intracranial hemorrhage, no definite findings of
acute/subacute cerebral ischemia, no mass, mass effect,
hydrocephalus or abnormal intra or extra-axial fluid collections.
Visualized paranasal sinuses and mastoids are well pneumatized. No
acute displaced skull fractures are identified.
IMPRESSION: *No acute intracranial abnormalities.
*The appearance of the brain is normal.

## 2016-11-28 ENCOUNTER — Ambulatory Visit (INDEPENDENT_AMBULATORY_CARE_PROVIDER_SITE_OTHER): Payer: BLUE CROSS/BLUE SHIELD | Admitting: Family

## 2016-11-28 ENCOUNTER — Encounter: Payer: Self-pay | Admitting: Family

## 2016-11-28 DIAGNOSIS — Z23 Encounter for immunization: Secondary | ICD-10-CM

## 2016-11-28 DIAGNOSIS — L98491 Non-pressure chronic ulcer of skin of other sites limited to breakdown of skin: Secondary | ICD-10-CM | POA: Diagnosis not present

## 2016-11-28 DIAGNOSIS — Z114 Encounter for screening for human immunodeficiency virus [HIV]: Secondary | ICD-10-CM

## 2016-11-28 MED ORDER — VALACYCLOVIR HCL 500 MG PO TABS: ORAL_TABLET | ORAL | 0 refills | Status: DC

## 2016-11-28 NOTE — Progress Notes (Addendum)
Subjective:    Patient ID: Jennifer Rich, female    DOB: 1965/03/29, 51 y.o.   MRN: [id number]  HPI  HTN- current bp meds include toprol xl, exforge, hctz.    Reports abscess between buttocks x 4 days.  Denies fever.    Review of Systems   See HPI  Past Medical History:  Diagnosis Date  . Allergy   . Anxiety and depression 01/30/2013  . 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 CAP (community acquired pneumonia) 04/17/2015  . Cervical cancer screening 01/25/2013  . Chicken pox as a child  . Goiter    Mildly enlarged thyroid TSH normal   . Hyperglycemia   . Hyperlipidemia   . Hypertension   . Obesity   . Obesity, unspecified 04/26/2013  . Pneumonia 05/02/2013  . Preventative health care 12/18/2012   Has seen Dr Jennifer Rich for GYN in past  . Thyroid disease    No per pt 05/25/15  . Type II or unspecified type diabetes mellitus without mention of complication, not stated as uncontrolled   . Vitamin D deficiency      Social History   Social History  . Marital status: Married    Spouse name: N/A  . Number of children: 0  . Years of education: N/A   Occupational History  . 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 Smoking status: Never Smoker  . Smokeless tobacco: Never Used  . Alcohol use No  . Drug use: No  . Sexual activity: Yes    Partners: Male     Comment: lives with husband and Jennifer Rich, Jennifer Rich, no dietary restrictions.   Other Topics Concern  . Not on file   Social History Narrative   No Pork          Past Surgical History:  Procedure Laterality Date  . None    . WISDOM TOOTH EXTRACTION  51 yrs old    Family History  Problem Relation Age of Onset  . Heart attack Maternal Grandfather 75  . Heart disease Maternal Grandfather   . Diabetes Maternal Grandmother        type 2  . Blindness Maternal Grandmother   . Hypertension Paternal Grandmother   . Stroke Paternal Grandfather   . Hypertension  Sister   . Hypertension Sister     No Known Allergies  No current outpatient prescriptions on file prior to visit.   No current facility-administered medications on file prior to visit.     BP (!) 158/106 (BP Location: Right Arm) Comment (Cuff Size): thigh  Pulse 75   Temp 98.4 F (36.9 C) (Oral)   Resp 18   Ht 5\' 7"  (1.702 m)   Wt (!) 319 lb 3.2 oz (144.8 kg)   LMP 11/01/2016   SpO2 100%   BMI 49.99 kg/m        Objective:   Physical Exam  Constitutional: She is oriented to person, place, and time. She appears well-developed and well-nourished. No distress.  Genitourinary:     Genitourinary Comments: Small ulcerated lesion tender to touch in rectal area  Neurological: She is alert and oriented to person, place, and time.  Psychiatric: Her behavior is normal. Judgment and thought content normal.          Assessment & Plan:  Perianal ulcer- area is quite painful. It appears to be most consistent with a herpes lesion. She reports that she's had similar symptoms in the  past the last episode was several months ago. Previous episodes have not been as tender. Will obtain HSV2 IgG.  Check RPR and HIV at pt request.   HTN- initial BP was elevated. Follow up bp was noted to be improved. Continue current meds. Keep upcoming follow up with Dr. Charlett Rich in 1 mont for BP recheck.

## 2016-11-28 NOTE — Patient Instructions (Signed)
Begin valtrex. Complete lab work prior to leaving.

## 2016-11-29 LAB — RPR: RPR Ser Ql: NONREACTIVE

## 2016-11-29 LAB — HIV ANTIBODY (ROUTINE TESTING W REFLEX): HIV 1&2 Ab, 4th Generation: NONREACTIVE

## 2016-12-01 ENCOUNTER — Encounter: Payer: Self-pay | Admitting: Family

## 2016-12-01 LAB — HSV 2 ANTIBODY, IGG: HSV 2 Glycoprotein G Ab, IgG: 11.6 index — ABNORMAL HIGH

## 2016-12-01 NOTE — Telephone Encounter (Signed)
Left message for pt to return my call.

## 2016-12-01 NOTE — Telephone Encounter (Signed)
See phone note

## 2016-12-01 NOTE — Telephone Encounter (Signed)
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 Based on these results she has probably had it for a while. At least months, maybe years.  Syphilis and HIV testing are negative.

## 2016-12-02 ENCOUNTER — Encounter: Payer: Self-pay | Admitting: Family

## 2016-12-02 NOTE — Telephone Encounter (Signed)
Discussed with pt via phone.

## 2016-12-16 ENCOUNTER — Ambulatory Visit: Payer: Self-pay | Admitting: Family Medicine

## 2016-12-18 ENCOUNTER — Telehealth: Payer: Self-pay | Admitting: Family Medicine

## 2016-12-18 ENCOUNTER — Encounter (HOSPITAL_BASED_OUTPATIENT_CLINIC_OR_DEPARTMENT_OTHER): Payer: Self-pay | Admitting: *Deleted

## 2016-12-18 ENCOUNTER — Emergency Department (HOSPITAL_BASED_OUTPATIENT_CLINIC_OR_DEPARTMENT_OTHER)
Admission: EM | Admit: 2016-12-18 | Discharge: 2016-12-18 | Disposition: A | Discharge status: Home / Self Care | Payer: BLUE CROSS/BLUE SHIELD | Attending: Emergency Medicine | Admitting: Emergency Medicine

## 2016-12-18 DIAGNOSIS — K1329 Other disturbances of oral epithelium, including tongue: Secondary | ICD-10-CM | POA: Insufficient documentation

## 2016-12-18 DIAGNOSIS — I10 Essential (primary) hypertension: Secondary | ICD-10-CM | POA: Diagnosis not present

## 2016-12-18 DIAGNOSIS — Z79899 Other long term (current) drug therapy: Secondary | ICD-10-CM | POA: Insufficient documentation

## 2016-12-18 DIAGNOSIS — E119 Type 2 diabetes mellitus without complications: Secondary | ICD-10-CM | POA: Insufficient documentation

## 2016-12-18 DIAGNOSIS — L299 Pruritus, unspecified: Secondary | ICD-10-CM | POA: Diagnosis not present

## 2016-12-18 DIAGNOSIS — F329 Major depressive disorder, single episode, unspecified: Secondary | ICD-10-CM | POA: Diagnosis not present

## 2016-12-18 DIAGNOSIS — F419 Anxiety disorder, unspecified: Secondary | ICD-10-CM | POA: Insufficient documentation

## 2016-12-18 DIAGNOSIS — K1370 Unspecified lesions of oral mucosa: Secondary | ICD-10-CM

## 2016-12-18 DIAGNOSIS — R202 Paresthesia of skin: Secondary | ICD-10-CM | POA: Diagnosis present

## 2016-12-18 MED ORDER — EPINEPHRINE 0.3 MG/0.3ML IJ SOAJ: 0.3000 mg | Freq: Once | INTRAMUSCULAR | 0 refills | Status: AC

## 2016-12-18 MED ORDER — PREDNISONE 10 MG PO TABS: 40.0000 mg | ORAL_TABLET | Freq: Every day | ORAL | 0 refills | Status: AC

## 2016-12-18 MED ORDER — PREDNISONE 10 MG PO TABS
60.0000 mg | ORAL_TABLET | Freq: Once | ORAL | Status: AC
  Administered 2016-12-18: 60 mg via ORAL
  Filled 2016-12-18: qty 1

## 2016-12-18 MED ORDER — DIPHENHYDRAMINE HCL 25 MG PO TABS: 25.0000 mg | ORAL_TABLET | Freq: Four times a day (QID) | ORAL | 0 refills | Status: DC | PRN

## 2016-12-18 MED ORDER — DIPHENHYDRAMINE HCL 25 MG PO CAPS
25.0000 mg | ORAL_CAPSULE | Freq: Once | ORAL | Status: AC
  Administered 2016-12-18: 25 mg via ORAL
  Filled 2016-12-18: qty 1

## 2016-12-18 MED ORDER — FAMOTIDINE 20 MG PO TABS
20.0000 mg | ORAL_TABLET | Freq: Once | ORAL | Status: AC
  Administered 2016-12-18: 20 mg via ORAL
  Filled 2016-12-18: qty 1

## 2016-12-18 MED ORDER — FAMOTIDINE 20 MG PO TABS: 20.0000 mg | ORAL_TABLET | Freq: Two times a day (BID) | ORAL | 0 refills | Status: DC

## 2016-12-18 NOTE — ED Notes (Signed)
ED Provider at bedside. 

## 2016-12-18 NOTE — ED Notes (Signed)
Vital signs stable. 

## 2016-12-18 NOTE — ED Provider Notes (Signed)
[facility] Provider Note   CSN: [id number] Arrival date & time: 12/18/16  1240     History   Chief Complaint Chief Complaint  Patient presents with  . Lips Tingling    HPI Jennifer Rich is a 51 y.o. female.  HPI 51 year old female with a history of hypertension on amlodipine/valsartan with no known allergies presents to the emergency department with more than 12 hours of tongue tingling and swelling that has spread to include upper and lower lip tingling/itching. No prior history of allergic reactions. No suspicious food intake. No fish consumption in the last several days. No change in her medications. No recent fevers or infections. She is denying any throat swelling or shortness of breath. No associated palpitation chest pain, nausea, abdominal pain.  No alleviating or aggravating factors. Patient denies any other physical complaints.  Past Medical History:  Diagnosis Date  . Allergy   . Anxiety and depression 01/30/2013  . 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 CAP (community acquired pneumonia) 04/17/2015  . Cervical cancer screening 01/25/2013  . Chicken pox as a child  . Goiter    Mildly enlarged thyroid TSH normal   . Hyperglycemia   . Hyperlipidemia   . Hypertension   . Obesity   . Obesity, unspecified 04/26/2013  . Pneumonia 05/02/2013  . Preventative health care 12/18/2012   Has seen Dr Carlota Raspberry for GYN in past  . Thyroid disease    No per pt 05/25/15  . Type II or unspecified type diabetes mellitus without mention of complication, not stated as uncontrolled   . Vitamin D deficiency     Patient Active Problem List   Diagnosis Date Noted  . CAP (community acquired pneumonia) 04/17/2015  . Generalized anxiety disorder 07/19/2014  . Essential hypertension, benign 11/02/2013  . Atypical chest pain 10/31/2013  . 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 Diarrhea 06/19/2013  . Obesity  04/26/2013  . Anxiety and depression 01/30/2013  . Cervical cancer screening 01/25/2013  . Preventative health care 12/18/2012  . Hyperlipidemia   . Hyperglycemia   . Vitamin D deficiency   . Goiter     Past Surgical History:  Procedure Laterality Date  . None    . WISDOM TOOTH EXTRACTION  51 yrs old    OB History    No data available       Home Medications    Prior to Admission medications   Medication Sig Start Date End Date Taking? Authorizing Provider  amLODipine-valsartan (EXFORGE) 5-320 MG tablet Take 1 tablet by mouth daily. 08/27/16  Yes [provider]  hydrochlorothiazide (HYDRODIURIL) 25 MG tablet Take 1 tablet by mouth daily. 09/25/16  Yes [provider]  metoprolol succinate (TOPROL-XL) 100 MG 24 hr tablet Take 1 tablet by mouth daily. 08/27/16  Yes [provider]  valACYclovir (VALTREX) 500 MG tablet 1 tab twice daily for 3 days as needed at start of outbreak 11/28/16  Yes [name], NP    Family History Family History  Problem Relation Age of Onset  . Heart attack Maternal Grandfather 75  . Heart disease Maternal Grandfather   . Diabetes Maternal Grandmother        type 2  . Blindness Maternal Grandmother   . Hypertension Paternal Grandmother   . Stroke Paternal Grandfather   . Hypertension Sister   . Hypertension Sister     Social History Social History  Substance Use Topics  . Smoking status: Never  Smoker  . Smokeless tobacco: Never Used  . Alcohol use No     Allergies   Patient has no known allergies.   Review of Systems Review of Systems All other systems are reviewed and are negative for acute change except as noted in the HPI   Physical Exam Updated Vital Signs BP (!) 143/75 (BP Location: Right Arm)   Pulse 83   Temp 98.3 F (36.8 C) (Oral)   Resp 18   Ht 5\' 7"  (1.702 m)   Wt (!) 144.7 kg (319 lb)   LMP 12/01/2016   SpO2 100%   BMI 49.96 kg/m   Physical Exam  Constitutional: She is  oriented to person, place, and time. She appears well-developed and well-nourished. No distress.  HENT:  Head: Normocephalic and atraumatic.  Nose: Nose normal.  No appreciable tongue or lip swelling.  Eyes: Pupils are equal, round, and reactive to light. Conjunctivae and EOM are normal. Right eye exhibits no discharge. Left eye exhibits no discharge. No scleral icterus.  Neck: Normal range of motion. Neck supple.  Cardiovascular: Normal rate and regular rhythm.  Exam reveals no gallop and no friction rub.   No murmur heard. Pulmonary/Chest: Effort normal and breath sounds normal. No stridor. No respiratory distress. She has no rales.  Abdominal: Soft. She exhibits no distension. There is no tenderness.  Musculoskeletal: She exhibits no edema or tenderness.  Neurological: She is alert and oriented to person, place, and time.  Skin: Skin is warm and dry. No rash noted. Rash is not urticarial. She is not diaphoretic. No erythema.  Psychiatric: She has a normal mood and affect.  Vitals reviewed.    ED Treatments / Results  Labs (all labs ordered are listed, but only abnormal results are displayed) Labs Reviewed - No data to display  EKG  EKG Interpretation None       Radiology No results found.  Procedures Procedures (including critical care time)  Medications Ordered in ED Medications  predniSONE (DELTASONE) tablet 60 mg (60 mg Oral Given 12/18/16 1352)  diphenhydrAMINE (BENADRYL) capsule 25 mg (25 mg Oral Given 12/18/16 1352)  famotidine (PEPCID) tablet 20 mg (20 mg Oral Given 12/18/16 1352)     Initial Impression / Assessment and Plan / ED Course  I have reviewed the triage vital signs and the nursing notes.  Pertinent labs & imaging results that were available during my care of the patient were reviewed by me and considered in my medical decision making (see chart for details).     Possible R jaw related angioedema-like symptoms versus allergic reaction to unknown  trigger. No evidence of respiratory distress or posterior oropharynx edema concerning for respiratory failure.  No respiratory, GI, or neurologic symptoms to suggest anaphylaxis. No recent infectious symptoms suggestive of viral urticaria.  Patient has not taken benadryl prior to arrival.   On exam, there is no evidence of oral swelling or airway compromise.   Given Benadryl, H2 blocker, and steroids. Monitored for 3 hors. Symptoms improved.  Will require additional 1-2 hr s of monitoring.  Patient care turned over to Dr Sherry Ruffing at 1600. Patient case and results discussed in detail; please see their note for further ED managment.    Final Clinical Impressions(s) / ED Diagnoses   Final diagnoses:  Itching  Mouth problem      Taima Rada, Grayce Sessions, MD 12/18/16 [phone]

## 2016-12-18 NOTE — ED Triage Notes (Addendum)
Slight swelling of her tongue, tingling in her lips since last night. Heartbeat is irregular at triage. She denies pain in her chest.

## 2016-12-18 NOTE — ED Notes (Signed)
Pt given note for work and directed to pharmacy to pick up Rx. Ambulatory to d/c window with steady gait

## 2016-12-18 NOTE — Telephone Encounter (Signed)
[facility] - Client [facility]  Patient Name: Jennifer Rich  DOB: [birth year]    Initial Comment Caller states her tongue is tingly and a little swollen.    Nurse Assessment  Nurse: Holly Bodily, RN, [place] Date/Time (Eastern Time): 12/18/2016 11:31:55 AM  Confirm and document reason for call. If symptomatic, describe symptoms. ---Caller states her tongue is tingly and a little swollen and that this started last night. Caller unsure of what is causing the swelling  Does the patient have any new or worsening symptoms? ---Yes  Will a triage be completed? ---Yes  Related visit to physician within the last 2 weeks? ---No  Does the PT have any chronic conditions? (i.e. diabetes, asthma, etc.) ---No  Is the patient pregnant or possibly pregnant? (Ask all females between the ages of 66-55) ---No  Is this a behavioral health or substance abuse call? ---No     Guidelines    Guideline Title Affirmed Question Affirmed Notes  Tongue Swelling All other adults with swollen tongue (Exception: tongue swelling is a recurrent problem AND NO swelling at present)    Final Disposition User   Go to ED Now [place], RN, [facility] - ED   Caller Disagree/Comply Comply  Caller Understands Yes  PreDisposition Go to ED

## 2017-01-05 ENCOUNTER — Telehealth: Payer: Self-pay | Admitting: Family Medicine

## 2017-01-05 ENCOUNTER — Inpatient Hospital Stay: Payer: Self-pay | Admitting: Family Medicine

## 2017-01-05 ENCOUNTER — Ambulatory Visit: Payer: Self-pay | Admitting: Family Medicine

## 2017-01-05 DIAGNOSIS — Z0289 Encounter for other administrative examinations: Secondary | ICD-10-CM

## 2017-01-05 NOTE — Telephone Encounter (Signed)
Completed.

## 2017-01-13 ENCOUNTER — Encounter: Payer: Self-pay | Admitting: Family Medicine

## 2017-01-13 ENCOUNTER — Other Ambulatory Visit: Payer: Self-pay

## 2017-01-13 ENCOUNTER — Ambulatory Visit (INDEPENDENT_AMBULATORY_CARE_PROVIDER_SITE_OTHER): Payer: BLUE CROSS/BLUE SHIELD | Admitting: Family Medicine

## 2017-01-13 VITALS — BP 122/78 | HR 87 | Temp 98.3°F | Resp 16 | Wt 314.8 lb

## 2017-01-13 DIAGNOSIS — E782 Mixed hyperlipidemia: Secondary | ICD-10-CM

## 2017-01-13 DIAGNOSIS — R739 Hyperglycemia, unspecified: Secondary | ICD-10-CM | POA: Diagnosis not present

## 2017-01-13 DIAGNOSIS — E559 Vitamin D deficiency, unspecified: Secondary | ICD-10-CM

## 2017-01-13 DIAGNOSIS — E669 Obesity, unspecified: Secondary | ICD-10-CM

## 2017-01-13 DIAGNOSIS — R21 Rash and other nonspecific skin eruption: Secondary | ICD-10-CM | POA: Diagnosis not present

## 2017-01-13 DIAGNOSIS — I10 Essential (primary) hypertension: Secondary | ICD-10-CM

## 2017-01-13 MED ORDER — AMLODIPINE BESYLATE-VALSARTAN 5-320 MG PO TABS: 1.0000 | ORAL_TABLET | Freq: Every day | ORAL | 1 refills | Status: DC

## 2017-01-13 MED ORDER — HYDROXYZINE HCL 25 MG PO TABS: 25.0000 mg | ORAL_TABLET | Freq: Three times a day (TID) | ORAL | 1 refills | Status: DC | PRN

## 2017-01-13 MED ORDER — METOPROLOL SUCCINATE ER 100 MG PO TB24: 100.0000 mg | ORAL_TABLET | Freq: Every day | ORAL | 1 refills | Status: DC

## 2017-01-13 MED ORDER — HYDROCHLOROTHIAZIDE 25 MG PO TABS: 25.0000 mg | ORAL_TABLET | Freq: Every day | ORAL | 1 refills | Status: DC

## 2017-01-13 MED FILL — METOPROLOL SUCC ER 100 MG T: 100 | 30 days supply | Qty: 30 | Fill #0

## 2017-01-13 MED FILL — AMLODIPINE-VALSARTAN 5-320: 5-320 | 30 days supply | Qty: 30 | Fill #0

## 2017-01-13 MED FILL — hydrOXYzine HCL 25 MG TABS: 25 | 13 days supply | Qty: 40 | Fill #0

## 2017-01-13 NOTE — Assessment & Plan Note (Signed)
Well controlled, no changes to meds. Encouraged heart healthy diet such as the DASH diet and exercise as tolerated. refills

## 2017-01-13 NOTE — Progress Notes (Signed)
Subjective:  I acted as a Education administrator for [employer]. Yancey Flemings, [place]   Patient ID: Jennifer Rich, female    DOB: 1965-05-26, 51 y.o.   MRN: [id number]  Chief Complaint  Patient presents with  . Follow-up  . Rash    all over/ itching    HPI  Patient is in today for follow up on elevated blood pressure and she is struggling with a pruritic rash she cannot pinpoint any new products or food. She has tried otc antihistamines and it has not resolved. Steroid creams caused some burning. No recent illness or hospitalizations. Denies CP/palp/SOB/HA/congestion/fevers/GI or GU c/o. Taking meds as prescribed  Patient Care Team: Mosie Lukes, MD as PCP - General (Family Medicine)   Past Medical History:  Diagnosis Date  . Allergy   . Anxiety and depression 01/30/2013  . 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 CAP (community acquired pneumonia) 04/17/2015  . Cervical cancer screening 01/25/2013  . Chicken pox as a child  . Goiter    Mildly enlarged thyroid TSH normal   . Hyperglycemia   . Hyperlipidemia   . Hypertension   . Obesity   . Obesity, unspecified 04/26/2013  . Pneumonia 05/02/2013  . Preventative health care 12/18/2012   Has seen Dr Carlota Raspberry for GYN in past  . Thyroid disease    No per pt 05/25/15  . Type II or unspecified type diabetes mellitus without mention of complication, not stated as uncontrolled   . Vitamin D deficiency     Past Surgical History:  Procedure Laterality Date  . None    . WISDOM TOOTH EXTRACTION  51 yrs old    Family History  Problem Relation Age of Onset  . Heart attack Maternal Grandfather 75  . Heart disease Maternal Grandfather   . Diabetes Maternal Grandmother        type 2  . Blindness Maternal Grandmother   . Hypertension Paternal Grandmother   . Stroke Paternal Grandfather   . Hypertension Sister   . Hypertension Sister     Social History   Socioeconomic History  . Marital status: Married    Spouse  name: Not on file  . Number of children: 0  . Years of education: Not on file  . Highest education level: Not on file  Social Needs  . Financial resource strain: Not on file  . Food insecurity - worry: Not on file  . Food insecurity - inability: Not on file  . Transportation needs - medical: Not on file  . Transportation needs - non-medical: Not on file  Occupational History  . Occupation: Research officer, political party: [employer]  Tobacco Use  . Smoking status: Never Smoker  . Smokeless tobacco: Never Used  Substance and Sexual Activity  . Alcohol use: No  . Drug use: No  . Sexual activity: Yes    Partners: Male    Comment: lives with husband and [place], [name], no dietary restrictions.  Other Topics Concern  . Not on file  Social History Narrative   No Pork          Outpatient Medications Prior to Visit  Medication Sig Dispense Refill  . diphenhydrAMINE (BENADRYL) 25 MG tablet Take 1 tablet (25 mg total) by mouth every 6 (six) hours as needed. 20 tablet 0  . famotidine (PEPCID) 20 MG tablet Take 1 tablet (20 mg total) by mouth 2 (two) times daily. 30 tablet 0  . valACYclovir (VALTREX) 500  MG tablet 1 tab twice daily for 3 days as needed at start of outbreak 18 tablet 0  . amLODipine-valsartan (EXFORGE) 5-320 MG tablet Take 1 tablet by mouth daily.  0  . hydrochlorothiazide (HYDRODIURIL) 25 MG tablet Take 1 tablet by mouth daily.  0  . metoprolol succinate (TOPROL-XL) 100 MG 24 hr tablet Take 1 tablet by mouth daily.     No facility-administered medications prior to visit.     No Known Allergies  Review of Systems  Constitutional: Negative for fever and malaise/fatigue.  HENT: Negative for congestion.   Respiratory: Negative for cough and shortness of breath.   Cardiovascular: Negative for chest pain and palpitations.  Gastrointestinal: Negative for vomiting.  Musculoskeletal: Negative for back pain.  Skin: Positive for itching and rash.  Neurological: Negative  for loss of consciousness and headaches.       Objective:    Physical Exam  Constitutional: She is oriented to person, place, and time. She appears well-developed and well-nourished. No distress.  HENT:  Head: Normocephalic and atraumatic.  Nose: Nose normal.  Eyes: Right eye exhibits no discharge. Left eye exhibits no discharge.  Neck: Normal range of motion. Neck supple.  Cardiovascular: Normal rate and regular rhythm.  No murmur heard. Pulmonary/Chest: Effort normal and breath sounds normal.  Abdominal: Soft. Bowel sounds are normal. There is no tenderness.  Musculoskeletal: She exhibits no edema.  Neurological: She is alert and oriented to person, place, and time.  Skin: Skin is warm and dry. Rash noted.  Psychiatric: She has a normal mood and affect.  Nursing note and vitals reviewed.   BP 122/78   Pulse 87   Temp 98.3 F (36.8 C) (Oral)   Resp 16   Wt (!) 314 lb 12.8 oz (142.8 kg)   SpO2 97%   BMI 49.30 kg/m  Wt Readings from Last 3 Encounters:  01/13/17 (!) 314 lb 12.8 oz (142.8 kg)  12/18/16 (!) 319 lb (144.7 kg)  11/28/16 (!) 319 lb 3.2 oz (144.8 kg)   BP Readings from Last 3 Encounters:  01/13/17 122/78  12/18/16 (!) 150/89  11/28/16 124/84     Immunization History  Administered Date(s) Administered  . Influenza,inj,Quad PF,6+ Mos 12/16/2012, 11/28/2016  . Pneumococcal Conjugate-13 12/27/2013  . Pneumococcal Polysaccharide-23 09/22/2011  . Tdap 12/16/2012    Health Maintenance  Topic Date Due  . FOOT EXAM  03/12/1976  . OPHTHALMOLOGY EXAM  03/12/1976  . PAP SMEAR  01/26/2016  . MAMMOGRAM  03/12/2016  . COLONOSCOPY  03/12/2016  . PNEUMOCOCCAL POLYSACCHARIDE VACCINE (2) 09/21/2016  . HEMOGLOBIN A1C  07/13/2017  . TETANUS/TDAP  12/17/2022  . INFLUENZA VACCINE  Completed  . HIV Screening  Completed    Lab Results  Component Value Date   WBC 5.5 01/13/2017   HGB 12.8 01/13/2017   HCT 40.7 01/13/2017   PLT 219.0 01/13/2017   GLUCOSE 77  01/13/2017   CHOL 204 (H) 01/13/2017   TRIG 118.0 01/13/2017   HDL 46.60 01/13/2017   LDLCALC 134 (H) 01/13/2017   ALT 8 01/13/2017   AST 15 01/13/2017   NA 139 01/13/2017   K 3.9 01/13/2017   CL 102 01/13/2017   CREATININE 0.99 01/13/2017   BUN 15 01/13/2017   CO2 29 01/13/2017   TSH 1.79 01/13/2017   HGBA1C 6.2 01/13/2017    Lab Results  Component Value Date   TSH 1.79 01/13/2017   Lab Results  Component Value Date   WBC 5.5 01/13/2017   HGB 12.8  01/13/2017   HCT 40.7 01/13/2017   MCV 79.0 01/13/2017   PLT 219.0 01/13/2017   Lab Results  Component Value Date   NA 139 01/13/2017   K 3.9 01/13/2017   CO2 29 01/13/2017   GLUCOSE 77 01/13/2017   BUN 15 01/13/2017   CREATININE 0.99 01/13/2017   BILITOT 0.8 01/13/2017   ALKPHOS 59 01/13/2017   AST 15 01/13/2017   ALT 8 01/13/2017   PROT 7.4 01/13/2017   ALBUMIN 3.7 01/13/2017   CALCIUM 9.4 01/13/2017   ANIONGAP 9 01/03/2015   GFR 76.10 01/13/2017   Lab Results  Component Value Date   CHOL 204 (H) 01/13/2017   Lab Results  Component Value Date   HDL 46.60 01/13/2017   Lab Results  Component Value Date   LDLCALC 134 (H) 01/13/2017   Lab Results  Component Value Date   TRIG 118.0 01/13/2017   Lab Results  Component Value Date   CHOLHDL 4 01/13/2017   Lab Results  Component Value Date   HGBA1C 6.2 01/13/2017         Assessment & Plan:   Problem List Items Addressed This Visit    Hyperlipidemia - Primary    Encouraged heart healthy diet, increase exercise, avoid trans fats, consider a krill oil cap daily      Relevant Medications   metoprolol succinate (TOPROL-XL) 100 MG 24 hr tablet   hydrochlorothiazide (HYDRODIURIL) 25 MG tablet   amLODipine-valsartan (EXFORGE) 5-320 MG tablet   Other Relevant Orders   Lipid panel (Completed)   Hyperglycemia     minimize simple carbs. Increase exercise as tolerated.       Relevant Orders   Hemoglobin A1c (Completed)   Vitamin D deficiency     Check level today, not taking any      Relevant Orders   VITAMIN D 25 Hydroxy (Vit-D Deficiency, Fractures) (Completed)   Obesity    Encouraged DASH diet, decrease po intake and increase exercise as tolerated. Needs 7-8 hours of sleep nightly. Avoid trans fats, eat small, frequent meals every 4-5 hours with lean proteins, complex carbs and healthy fats. Minimize simple carbs, bariatric referral      Essential hypertension, benign    Well controlled, no changes to meds. Encouraged heart healthy diet such as the DASH diet and exercise as tolerated. refills      Relevant Medications   metoprolol succinate (TOPROL-XL) 100 MG 24 hr tablet   hydrochlorothiazide (HYDRODIURIL) 25 MG tablet   amLODipine-valsartan (EXFORGE) 5-320 MG tablet   Other Relevant Orders   CBC (Completed)   Comprehensive metabolic panel (Completed)   TSH (Completed)   Rash    Zyrtec and Zantac bid, steroid cream prn, dermatology if does not resolve         I am having Heidi A. Lineberry maintain her valACYclovir, diphenhydrAMINE, famotidine, metoprolol succinate, hydrOXYzine, hydrochlorothiazide, and amLODipine-valsartan.  Meds ordered this encounter  Medications  . DISCONTD: amLODipine-valsartan (EXFORGE) 5-320 MG tablet    Sig: Take 1 tablet daily by mouth.    Dispense:  90 tablet    Refill:  1  . DISCONTD: hydrochlorothiazide (HYDRODIURIL) 25 MG tablet    Sig: Take 1 tablet (25 mg total) daily by mouth.    Dispense:  90 tablet    Refill:  1  . DISCONTD: metoprolol succinate (TOPROL-XL) 100 MG 24 hr tablet    Sig: Take 1 tablet (100 mg total) daily by mouth.    Dispense:  90 tablet    Refill:  1  . DISCONTD: hydrOXYzine (ATARAX/VISTARIL) 25 MG tablet    Sig: Take 1 tablet (25 mg total) every 8 (eight) hours as needed by mouth for itching.    Dispense:  40 tablet    Refill:  1  . metoprolol succinate (TOPROL-XL) 100 MG 24 hr tablet    Sig: Take 1 tablet (100 mg total) daily by mouth.    Dispense:   90 tablet    Refill:  1  . hydrOXYzine (ATARAX/VISTARIL) 25 MG tablet    Sig: Take 1 tablet (25 mg total) every 8 (eight) hours as needed by mouth for itching.    Dispense:  40 tablet    Refill:  1  . hydrochlorothiazide (HYDRODIURIL) 25 MG tablet    Sig: Take 1 tablet (25 mg total) daily by mouth.    Dispense:  90 tablet    Refill:  1  . amLODipine-valsartan (EXFORGE) 5-320 MG tablet    Sig: Take 1 tablet daily by mouth.    Dispense:  90 tablet    Refill:  1    CMA served as Education administrator during this visit. History, Physical and Plan performed by medical provider. Documentation and orders reviewed and attested to.  Penni Homans, MD

## 2017-01-13 NOTE — Assessment & Plan Note (Signed)
Check level today, not taking any

## 2017-01-13 NOTE — Assessment & Plan Note (Signed)
Encouraged DASH diet, decrease po intake and increase exercise as tolerated. Needs 7-8 hours of sleep nightly. Avoid trans fats, eat small, frequent meals every 4-5 hours with lean proteins, complex carbs and healthy fats. Minimize simple carbs, bariatric referral 

## 2017-01-13 NOTE — Patient Instructions (Addendum)
Zyrtec 10 mg and Zantac 150 mg twice daily Between the hydroxyzine and the Zyrtec no more than 3 doses a day  Hypertension Hypertension is another name for high blood pressure. High blood pressure forces your heart to work harder to pump blood. This can cause problems over time. There are two numbers in a blood pressure reading. There is a top number (systolic) over a bottom number (diastolic). It is best to have a blood pressure below 120/80. Healthy choices can help lower your blood pressure. You may need medicine to help lower your blood pressure if:  Your blood pressure cannot be lowered with healthy choices.  Your blood pressure is higher than 130/80.  Follow these instructions at home: Eating and drinking  If directed, follow the DASH eating plan. This diet includes: ? Filling half of your plate at each meal with fruits and vegetables. ? Filling one quarter of your plate at each meal with whole grains. Whole grains include whole wheat pasta, brown rice, and whole grain bread. ? Eating or drinking low-fat dairy products, such as skim milk or low-fat yogurt. ? Filling one quarter of your plate at each meal with low-fat (lean) proteins. Low-fat proteins include fish, skinless chicken, eggs, beans, and tofu. ? Avoiding fatty meat, cured and processed meat, or chicken with skin. ? Avoiding premade or processed food.  Eat less than 1,500 mg of salt (sodium) a day.  Limit alcohol use to no more than 1 drink a day for nonpregnant women and 2 drinks a day for men. One drink equals 12 oz of beer, 5 oz of wine, or 1 oz of hard liquor. Lifestyle  Work with your doctor to stay at a healthy weight or to lose weight. Ask your doctor what the best weight is for you.  Get at least 30 minutes of exercise that causes your heart to beat faster (aerobic exercise) most days of the week. This may include walking, swimming, or biking.  Get at least 30 minutes of exercise that strengthens your muscles  (resistance exercise) at least 3 days a week. This may include lifting weights or pilates.  Do not use any products that contain nicotine or tobacco. This includes cigarettes and e-cigarettes. If you need help quitting, ask your doctor.  Check your blood pressure at home as told by your doctor.  Keep all follow-up visits as told by your doctor. This is important. Medicines  Take over-the-counter and prescription medicines only as told by your doctor. Follow directions carefully.  Do not skip doses of blood pressure medicine. The medicine does not work as well if you skip doses. Skipping doses also puts you at risk for problems.  Ask your doctor about side effects or reactions to medicines that you should watch for. Contact a doctor if:  You think you are having a reaction to the medicine you are taking.  You have headaches that keep coming back (recurring).  You feel dizzy.  You have swelling in your ankles.  You have trouble with your vision. Get help right away if:  You get a very bad headache.  You start to feel confused.  You feel weak or numb.  You feel faint.  You get very bad pain in your: ? Chest. ? Belly (abdomen).  You throw up (vomit) more than once.  You have trouble breathing. Summary  Hypertension is another name for high blood pressure.  Making healthy choices can help lower blood pressure. If your blood pressure cannot be controlled with  healthy choices, you may need to take medicine. This information is not intended to replace advice given to you by your health care provider. Make sure you discuss any questions you have with your health care provider. Document Released: 08/06/2007 Document Revised: 01/16/2016 Document Reviewed: 01/16/2016 Elsevier Interactive Patient Education  Izacc Schein.

## 2017-01-13 NOTE — Assessment & Plan Note (Signed)
minimize simple carbs. Increase exercise as tolerated.  

## 2017-01-13 NOTE — Assessment & Plan Note (Signed)
>>  ASSESSMENT AND PLAN FOR HYPERGLYCEMIA WRITTEN ON 01/13/2017  2:17 PM BY BLYTH, STACEY A, MD   minimize simple carbs. Increase exercise as tolerated.

## 2017-01-14 LAB — COMPREHENSIVE METABOLIC PANEL
ALT: 8 U/L (ref 0–35)
AST: 15 U/L (ref 0–37)
Albumin: 3.7 g/dL (ref 3.5–5.2)
Alkaline Phosphatase: 59 U/L (ref 39–117)
BILIRUBIN TOTAL: 0.8 mg/dL (ref 0.2–1.2)
BUN: 15 mg/dL (ref 6–23)
CHLORIDE: 102 meq/L (ref 96–112)
CO2: 29 meq/L (ref 19–32)
Calcium: 9.4 mg/dL (ref 8.4–10.5)
Creatinine, Ser: 0.99 mg/dL (ref 0.40–1.20)
GFR: 76.1 mL/min (ref >60.00)
GLUCOSE: 77 mg/dL (ref 70–99)
POTASSIUM: 3.9 meq/L (ref 3.5–5.1)
Sodium: 139 mEq/L (ref 135–145)
Total Protein: 7.4 g/dL (ref 6.0–8.3)

## 2017-01-14 LAB — HEMOGLOBIN A1C: HEMOGLOBIN A1C: 6.2 % (ref 4.6–6.5)

## 2017-01-14 LAB — CBC
HCT: 40.7 % (ref 36.0–46.0)
HEMOGLOBIN: 12.8 g/dL (ref 12.0–15.0)
MCHC: 31.4 g/dL (ref 30.0–36.0)
MCV: 79 fl (ref 78.0–100.0)
Platelets: 219 10*3/uL (ref 150.0–400.0)
RBC: 5.15 Mil/uL — ABNORMAL HIGH (ref 3.87–5.11)
RDW: 15.9 % — AB (ref 11.5–15.5)
WBC: 5.5 10*3/uL (ref 4.0–10.5)

## 2017-01-14 LAB — TSH: TSH: 1.79 u[IU]/mL (ref 0.35–4.50)

## 2017-01-14 LAB — VITAMIN D 25 HYDROXY (VIT D DEFICIENCY, FRACTURES): VITD: 13.28 ng/mL — ABNORMAL LOW (ref 30.00–100.00)

## 2017-01-14 LAB — LIPID PANEL
CHOLESTEROL: 204 mg/dL — AB (ref 0–200)
HDL: 46.6 mg/dL (ref >39.00)
LDL Cholesterol: 134 mg/dL — ABNORMAL HIGH (ref 0–99)
NONHDL: 157.83
TRIGLYCERIDES: 118 mg/dL (ref 0.0–149.0)
Total CHOL/HDL Ratio: 4
VLDL: 23.6 mg/dL (ref 0.0–40.0)

## 2017-01-18 DIAGNOSIS — R21 Rash and other nonspecific skin eruption: Secondary | ICD-10-CM | POA: Insufficient documentation

## 2017-01-18 NOTE — Assessment & Plan Note (Signed)
Encouraged heart healthy diet, increase exercise, avoid trans fats, consider a krill oil cap daily 

## 2017-01-18 NOTE — Assessment & Plan Note (Signed)
Zyrtec and Zantac bid, steroid cream prn, dermatology if does not resolve

## 2017-02-03 ENCOUNTER — Encounter: Payer: Self-pay | Admitting: Family Medicine

## 2017-07-23 ENCOUNTER — Encounter: Payer: Self-pay | Admitting: Family Medicine

## 2017-08-30 ENCOUNTER — Encounter (HOSPITAL_BASED_OUTPATIENT_CLINIC_OR_DEPARTMENT_OTHER): Payer: Self-pay | Admitting: Emergency Medicine

## 2017-08-30 ENCOUNTER — Emergency Department (HOSPITAL_BASED_OUTPATIENT_CLINIC_OR_DEPARTMENT_OTHER)
Admission: EM | Admit: 2017-08-30 | Discharge: 2017-08-31 | Disposition: A | Discharge status: Home / Self Care | Payer: Self-pay | Attending: Emergency Medicine | Admitting: Emergency Medicine

## 2017-08-30 ENCOUNTER — Other Ambulatory Visit: Payer: Self-pay

## 2017-08-30 DIAGNOSIS — M5432 Sciatica, left side: Secondary | ICD-10-CM | POA: Insufficient documentation

## 2017-08-30 DIAGNOSIS — E119 Type 2 diabetes mellitus without complications: Secondary | ICD-10-CM | POA: Insufficient documentation

## 2017-08-30 DIAGNOSIS — Z79899 Other long term (current) drug therapy: Secondary | ICD-10-CM | POA: Insufficient documentation

## 2017-08-30 DIAGNOSIS — E079 Disorder of thyroid, unspecified: Secondary | ICD-10-CM | POA: Insufficient documentation

## 2017-08-30 DIAGNOSIS — I10 Essential (primary) hypertension: Secondary | ICD-10-CM | POA: Insufficient documentation

## 2017-08-30 NOTE — ED Triage Notes (Signed)
PT presents with c/o left hip pain after lifting stuff while moving 2 days ago. PT reports papin down her left leg.

## 2017-08-31 MED ORDER — IBUPROFEN 600 MG PO TABS: 600.0000 mg | ORAL_TABLET | Freq: Four times a day (QID) | ORAL | 0 refills | Status: DC | PRN

## 2017-08-31 MED ORDER — CYCLOBENZAPRINE HCL 5 MG PO TABS
5.0000 mg | ORAL_TABLET | Freq: Once | ORAL | Status: AC
  Administered 2017-08-31: 5 mg via ORAL
  Filled 2017-08-31: qty 1

## 2017-08-31 MED ORDER — CYCLOBENZAPRINE HCL 5 MG PO TABS: 5.0000 mg | ORAL_TABLET | Freq: Three times a day (TID) | ORAL | 0 refills | Status: DC | PRN

## 2017-08-31 MED ORDER — NAPROXEN 250 MG PO TABS
500.0000 mg | ORAL_TABLET | Freq: Once | ORAL | Status: AC
  Administered 2017-08-31: 500 mg via ORAL
  Filled 2017-08-31: qty 2

## 2017-08-31 NOTE — ED Provider Notes (Signed)
[facility] EMERGENCY DEPARTMENT Provider Note   CSN: [id number] Arrival date & time: 08/30/17  2254     History   Chief Complaint Chief Complaint  Patient presents with  . Hip Pain    HPI Jennifer Rich is a 52 y.o. female.  HPI  This is a 52 year old female who presents with back and left hip pain.  Patient reports that she was lifting a heavy mattress yesterday when she developed lower back and left hip pain.  Currently she rates her pain at 7 out of 10.  She has taken ibuprofen with some relief.  She states that the pain radiates down the back of her left leg.  She denies weakness, numbness, tingling, bowel or bladder difficulty.  She denies IV drug use, fever, history of cancer.  She states that the pain is sharp in nature.  Denies any urinary symptoms.  Past Medical History:  Diagnosis Date  . Allergy   . Anxiety and depression 01/30/2013  . 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 CAP (community acquired pneumonia) 04/17/2015  . Cervical cancer screening 01/25/2013  . Chicken pox as a child  . Goiter    Mildly enlarged thyroid TSH normal   . Hyperglycemia   . Hyperlipidemia   . Hypertension   . Obesity   . Obesity, unspecified 04/26/2013  . Pneumonia 05/02/2013  . Preventative health care 12/18/2012   Has seen Dr Carlota Raspberry for GYN in past  . Thyroid disease    No per pt 05/25/15  . Type II or unspecified type diabetes mellitus without mention of complication, not stated as uncontrolled   . Vitamin D deficiency     Patient Active Problem List   Diagnosis Date Noted  . Rash 01/18/2017  . Essential hypertension, benign 11/02/2013  . Atypical chest pain 10/31/2013  . 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 Diarrhea 06/19/2013  . Obesity 04/26/2013  . Anxiety and depression 01/30/2013  . Cervical cancer screening 01/25/2013  . Preventative health care 12/18/2012  . Hyperlipidemia   . Hyperglycemia   . Vitamin D  deficiency   . Goiter     Past Surgical History:  Procedure Laterality Date  . None    . WISDOM TOOTH EXTRACTION  52 yrs old     OB History   None      Home Medications    Prior to Admission medications   Medication Sig Start Date End Date Taking? Authorizing Provider  amLODipine-valsartan (EXFORGE) 5-320 MG tablet Take 1 tablet daily by mouth. 01/13/17   Mosie Lukes, MD  cyclobenzaprine (FLEXERIL) 5 MG tablet Take 1 tablet (5 mg total) by mouth 3 (three) times daily as needed for muscle spasms. 08/31/17   Hydee Fleece, Barbette Hair, MD  diphenhydrAMINE (BENADRYL) 25 MG tablet Take 1 tablet (25 mg total) by mouth every 6 (six) hours as needed. 12/18/16   Fatima Blank, MD  famotidine (PEPCID) 20 MG tablet Take 1 tablet (20 mg total) by mouth 2 (two) times daily. 12/18/16   Fatima Blank, MD  hydrochlorothiazide (HYDRODIURIL) 25 MG tablet Take 1 tablet (25 mg total) daily by mouth. 01/13/17   Mosie Lukes, MD  hydrOXYzine (ATARAX/VISTARIL) 25 MG tablet Take 1 tablet (25 mg total) every 8 (eight) hours as needed by mouth for itching. 01/13/17   Mosie Lukes, MD  ibuprofen (ADVIL,MOTRIN) 600 MG tablet Take 1 tablet (600 mg total) by mouth every 6 (six) hours as needed. 08/31/17  Gentle Hoge, Barbette Hair, MD  metoprolol succinate (TOPROL-XL) 100 MG 24 hr tablet Take 1 tablet (100 mg total) daily by mouth. 01/13/17   Mosie Lukes, MD  valACYclovir (VALTREX) 500 MG tablet 1 tab twice daily for 3 days as needed at start of outbreak 11/28/16   [name], NP    Family History Family History  Problem Relation Age of Onset  . Heart attack Maternal Grandfather 75  . Heart disease Maternal Grandfather   . Diabetes Maternal Grandmother        type 2  . Blindness Maternal Grandmother   . Hypertension Paternal Grandmother   . Stroke Paternal Grandfather   . Hypertension Sister   . Hypertension Sister     Social History Social History   Tobacco Use  . Smoking  status: Never Smoker  . Smokeless tobacco: Never Used  Substance Use Topics  . Alcohol use: No  . Drug use: No     Allergies   Patient has no known allergies.   Review of Systems Review of Systems  Constitutional: Negative for fever.  Genitourinary: Negative for difficulty urinating and dysuria.  Musculoskeletal: Positive for back pain.  Skin: Negative for wound.  Neurological: Negative for weakness and numbness.  All other systems reviewed and are negative.    Physical Exam Updated Vital Signs BP (!) 140/108 (BP Location: Left Arm)   Pulse 90   Temp 98.6 F (37 C) (Oral)   Resp 18   Ht 5\' 8"  (1.727 m)   Wt (!) 142.9 kg (315 lb)   SpO2 100%   BMI 47.90 kg/m   Physical Exam  Constitutional: She is oriented to person, place, and time. She appears well-developed and well-nourished.  Obese, nontoxic-appearing  HENT:  Head: Normocephalic and atraumatic.  Cardiovascular: Normal rate, regular rhythm and normal heart sounds.  Pulmonary/Chest: Effort normal and breath sounds normal. No respiratory distress. She has no wheezes.  Abdominal: Soft. There is no tenderness.  Musculoskeletal: She exhibits no deformity.  Tenderness to palpation lower lumbar spine, no step-off or deformity, tenderness palpation left glute, negative straight leg raise  Neurological: She is alert and oriented to person, place, and time.  5 out of 5 strength bilateral lower extremities, no clonus, normal reflexes  Skin: Skin is warm and dry.  Psychiatric: She has a normal mood and affect.  Nursing note and vitals reviewed.    ED Treatments / Results  Labs (all labs ordered are listed, but only abnormal results are displayed) Labs Reviewed - No data to display  EKG None  Radiology No results found.  Procedures Procedures (including critical care time)  Medications Ordered in ED Medications  cyclobenzaprine (FLEXERIL) tablet 5 mg (5 mg Oral Given 08/31/17 0040)  naproxen (NAPROSYN)  tablet 500 mg (500 mg Oral Given 08/31/17 0040)     Initial Impression / Assessment and Plan / ED Course  I have reviewed the triage vital signs and the nursing notes.  Pertinent labs & imaging results that were available during my care of the patient were reviewed by me and considered in my medical decision making (see chart for details).     Patient presents with back and left hip pain.  Her symptoms are most suggestive of sciatica.  She is overall nontoxic-appearing.  No signs or symptoms of cauda equina.  Low suspicion for fracture at this time.  Patient has been ambulatory.  Patient given ibuprofen and Flexeril.  Recommend continued supportive measures at home and follow-up with primary physician  if not improving in 1 to 2 weeks.  After history, exam, and medical workup I feel the patient has been appropriately medically screened and is safe for discharge home. Pertinent diagnoses were discussed with the patient. Patient was given return precautions.   Final Clinical Impressions(s) / ED Diagnoses   Final diagnoses:  Sciatica of left side    ED Discharge Orders        Ordered    ibuprofen (ADVIL,MOTRIN) 600 MG tablet  Every 6 hours PRN     08/31/17 0044    cyclobenzaprine (FLEXERIL) 5 MG tablet  3 times daily PRN     08/31/17 0044       Merryl Hacker, MD 08/31/17 [phone]

## 2017-08-31 NOTE — ED Notes (Signed)
Pt discharged to home with family. NAD.  

## 2017-08-31 NOTE — Discharge Instructions (Addendum)
You were seen today for back and left hip pain.  Your symptoms are consistent with sciatica.  Follow-up with your primary physician if not improving in 1 to 2 weeks.  Avoid heavy lifting.  Do not drive or operate heavy machinery while taking muscle relaxers.

## 2017-10-09 ENCOUNTER — Ambulatory Visit (INDEPENDENT_AMBULATORY_CARE_PROVIDER_SITE_OTHER): Payer: BLUE CROSS/BLUE SHIELD | Admitting: Family

## 2017-10-09 ENCOUNTER — Encounter: Payer: Self-pay | Admitting: Family

## 2017-10-09 VITALS — BP 138/84 | HR 87 | Temp 97.8°F | Resp 16 | Ht 67.0 in | Wt 322.0 lb

## 2017-10-09 DIAGNOSIS — I10 Essential (primary) hypertension: Secondary | ICD-10-CM | POA: Diagnosis not present

## 2017-10-09 DIAGNOSIS — E559 Vitamin D deficiency, unspecified: Secondary | ICD-10-CM | POA: Diagnosis not present

## 2017-10-09 DIAGNOSIS — A6000 Herpesviral infection of urogenital system, unspecified: Secondary | ICD-10-CM | POA: Diagnosis not present

## 2017-10-09 LAB — BASIC METABOLIC PANEL
BUN: 19 mg/dL (ref 6–23)
CHLORIDE: 103 meq/L (ref 96–112)
CO2: 28 mEq/L (ref 19–32)
Calcium: 9.4 mg/dL (ref 8.4–10.5)
Creatinine, Ser: 1.05 mg/dL (ref 0.40–1.20)
GFR: 70.9 mL/min (ref >60.00)
Glucose, Bld: 175 mg/dL — ABNORMAL HIGH (ref 70–99)
POTASSIUM: 3.4 meq/L — AB (ref 3.5–5.1)
SODIUM: 141 meq/L (ref 135–145)

## 2017-10-09 MED ORDER — HYDROCHLOROTHIAZIDE 25 MG PO TABS: 25.0000 mg | ORAL_TABLET | Freq: Every day | ORAL | 1 refills | Status: DC

## 2017-10-09 MED ORDER — METOPROLOL SUCCINATE ER 100 MG PO TB24: 100.0000 mg | ORAL_TABLET | Freq: Every day | ORAL | 1 refills | Status: DC

## 2017-10-09 MED ORDER — VALACYCLOVIR HCL 500 MG PO TABS: 500.0000 mg | ORAL_TABLET | Freq: Every day | ORAL | 1 refills | Status: DC

## 2017-10-09 MED ORDER — AMLODIPINE BESYLATE-VALSARTAN 5-320 MG PO TABS: 1.0000 | ORAL_TABLET | Freq: Every day | ORAL | 1 refills | Status: DC

## 2017-10-09 MED FILL — HYDROCHLOROTHIAZIDE 25 MG T: 25 | 30 days supply | Qty: 30 | Fill #0

## 2017-10-09 MED FILL — VALACYCLOVIR HCL 500 MG TAB: 500 | 30 days supply | Qty: 30 | Fill #0

## 2017-10-09 NOTE — Patient Instructions (Signed)
Please complete lab work prior to leaving. Start valtrex once daily.

## 2017-10-09 NOTE — Progress Notes (Signed)
Subjective:    Patient ID: Jennifer Rich, female    DOB: 1965-08-05, 52 y.o.   MRN: [id number]  HPI   Patient is a 52 yr old female who presents today for follow up.  HTN- she is maintained on exforg 5-320mg , toprol xl 100mg  and hctz 25mg .  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  Vit d deficiency- did not start vit D supplement.  Review of Systems  See HPI  Past Medical History:  Diagnosis Date  . Allergy   . Anxiety and depression 01/30/2013  . 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 CAP (community acquired pneumonia) 04/17/2015  . Cervical cancer screening 01/25/2013  . Chicken pox as a child  . Goiter    Mildly enlarged thyroid TSH normal   . Hyperglycemia   . Hyperlipidemia   . Hypertension   . Obesity   . Obesity, unspecified 04/26/2013  . Pneumonia 05/02/2013  . Preventative health care 12/18/2012   Has seen Dr Carlota Raspberry for GYN in past  . Thyroid disease    No per pt 05/25/15  . Type II or unspecified type diabetes mellitus without mention of complication, not stated as uncontrolled   . Vitamin D deficiency      Social History   Socioeconomic History  . Marital status: Married    Spouse name: Not on file  . Number of children: 0  . Years of education: Not on file  . Highest education level: Not on file  Occupational History  . Occupation: Research officer, political party: [employer]  Social Needs  . Financial resource strain: Not on file  . Food insecurity:    Worry: Not on file    Inability: Not on file  . Transportation needs:    Medical: Not on file    Non-medical: Not on file  Tobacco Use  . Smoking status: Never Smoker  . Smokeless tobacco: Never Used  Substance and Sexual Activity  . Alcohol use: No  . Drug use: No  . Sexual activity: Yes    Partners: Male    Comment: lives with husband and  [place], [name], no dietary restrictions.  Lifestyle  . Physical activity:    Days per week: Not on file    Minutes per session: Not on file  . Stress: Not on file  Relationships  . Social connections:    Talks on phone: Not on file    Gets together: Not on file    Attends religious service: Not on file    Active member of club or organization: Not on file    Attends meetings of clubs or organizations: Not on file    Relationship status: Not on file  . Intimate partner violence:    Fear of current or ex partner: Not on file    Emotionally abused: Not on file    Physically abused: Not on file    Forced sexual activity: Not on file  Other Topics Concern  . Not on file  Social History Narrative   No Pork          Past Surgical History:  Procedure Laterality Date  . None    . WISDOM TOOTH EXTRACTION  52 yrs old    Family History  Problem Relation Age of Onset  . Heart attack Maternal Grandfather 75  .  Heart disease Maternal Grandfather   . Diabetes Maternal Grandmother        type 2  . Blindness Maternal Grandmother   . Hypertension Paternal Grandmother   . Stroke Paternal Grandfather   . Hypertension Sister   . Hypertension Sister     No Known Allergies  Current Outpatient Medications on File Prior to Visit  Medication Sig Dispense Refill  . diphenhydrAMINE (BENADRYL) 25 MG tablet Take 1 tablet (25 mg total) by mouth every 6 (six) hours as needed. 20 tablet 0  . famotidine (PEPCID) 20 MG tablet Take 1 tablet (20 mg total) by mouth 2 (two) times daily. 30 tablet 0  . hydrOXYzine (ATARAX/VISTARIL) 25 MG tablet Take 1 tablet (25 mg total) every 8 (eight) hours as needed by mouth for itching. 40 tablet 1   No current facility-administered medications on file prior to visit.     BP 138/84 (BP Location: Right Arm, Patient Position: Sitting, Cuff Size: Large)   Pulse 87   Temp 97.8 F (36.6 C) (Oral)   Resp 16   Ht 5\' 7"  (2.956 m)   Wt (!) 322 lb (146.1 kg)    LMP 09/14/2017   SpO2 100%   BMI 50.43 kg/m        Objective:   Physical Exam  Constitutional: She is oriented to person, place, and time. She appears well-developed and well-nourished.  Cardiovascular: Normal rate, regular rhythm and normal heart sounds.  No murmur heard. Pulmonary/Chest: Effort normal and breath sounds normal. No respiratory distress. She has no wheezes.  Musculoskeletal: She exhibits no edema.  Neurological: She is alert and oriented to person, place, and time.  Psychiatric: She has a normal mood and affect. Her behavior is normal. Judgment and thought content normal.          Assessment & Plan:  HTN- bp stable on current meds. Continue same. Obtain follow up bmet.  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 Start valtrex 500mg  once daily.  Vit d deficiency- did not start recommended supplement. Repeat vit D today and replete as needed.

## 2017-10-11 ENCOUNTER — Telehealth: Payer: Self-pay | Admitting: Family

## 2017-10-11 DIAGNOSIS — E876 Hypokalemia: Secondary | ICD-10-CM

## 2017-10-11 LAB — VITAMIN D 1,25 DIHYDROXY
Vitamin D 1, 25 (OH)2 Total: 61 pg/mL (ref 18–72)
Vitamin D2 1, 25 (OH)2: 11 pg/mL
Vitamin D3 1, 25 (OH)2: 50 pg/mL

## 2017-10-11 MED ORDER — POTASSIUM CHLORIDE CRYS ER 20 MEQ PO TBCR: 20.0000 meq | EXTENDED_RELEASE_TABLET | Freq: Every day | ORAL | 3 refills | Status: DC

## 2017-10-11 NOTE — Telephone Encounter (Signed)
Potassium is low. Add kdur once daily, follow up in 1 week for bmet. Lab order placed.

## 2017-10-12 ENCOUNTER — Other Ambulatory Visit: Payer: Self-pay

## 2017-10-12 MED ORDER — POTASSIUM CHLORIDE CRYS ER 20 MEQ PO TBCR: 20.0000 meq | EXTENDED_RELEASE_TABLET | Freq: Every day | ORAL | 3 refills | Status: DC

## 2017-10-12 NOTE — Telephone Encounter (Signed)
Called pt to discuss lab results and provider recommendations. Pt states that due to her job in [place] she is unable to get back to the office any sooner than 10/30/17 and that the appt would have to be 3pm or later. Pt already had a physical scheduled with Melissa on that date at 1:40 pm that pt request to have changed to 3pm. Informed pt that provider does not schedule physical's after 3pm. Pt states that she will have to reschedule the physical because she can't make the 1:40 pm appt but can still come for the lab appt.  Canceled pt's physical appt for 10/30/17 with Melissa at 1:40 pm per pt's request. Pt will call back to reschedule physical.  Made lab only appt on 10/30/17 @ 3pm  to recheck Bmet.

## 2017-10-12 NOTE — Telephone Encounter (Signed)
Pt request potassium rx be sent to [facility] in [place] instead of [place]. Rx sent per pt request.

## 2017-10-13 NOTE — Telephone Encounter (Signed)
Noted  

## 2017-10-17 ENCOUNTER — Encounter: Payer: Self-pay | Admitting: Family Medicine

## 2017-10-17 ENCOUNTER — Ambulatory Visit (INDEPENDENT_AMBULATORY_CARE_PROVIDER_SITE_OTHER): Payer: BLUE CROSS/BLUE SHIELD | Admitting: Family Medicine

## 2017-10-17 VITALS — HR 91 | Temp 98.6°F | Resp 16 | Ht 67.0 in | Wt 324.0 lb

## 2017-10-17 DIAGNOSIS — R05 Cough: Secondary | ICD-10-CM | POA: Insufficient documentation

## 2017-10-17 DIAGNOSIS — J209 Acute bronchitis, unspecified: Secondary | ICD-10-CM

## 2017-10-17 DIAGNOSIS — R059 Cough, unspecified: Secondary | ICD-10-CM | POA: Insufficient documentation

## 2017-10-17 MED ORDER — AZITHROMYCIN 250 MG PO TABS: ORAL_TABLET | ORAL | 0 refills | Status: DC

## 2017-10-17 MED ORDER — GUAIFENESIN-CODEINE 100-10 MG/5ML PO SYRP: 5.0000 mL | ORAL_SOLUTION | Freq: Every evening | ORAL | 0 refills | Status: DC | PRN

## 2017-10-17 MED ORDER — PREDNISONE 20 MG PO TABS: ORAL_TABLET | ORAL | 0 refills | Status: DC

## 2017-10-17 MED ORDER — ALBUTEROL SULFATE HFA 108 (90 BASE) MCG/ACT IN AERS: 2.0000 | INHALATION_SPRAY | Freq: Four times a day (QID) | RESPIRATORY_TRACT | 0 refills | Status: DC | PRN

## 2017-10-17 NOTE — Assessment & Plan Note (Signed)
Given symptoms and worsening at this point.. Concerning for bacterial infection. Treat with antibiotics, prednisone and albuterol prn. Cough suppressant as needed.

## 2017-10-17 NOTE — Progress Notes (Signed)
   Subjective:    Patient ID: Jennifer Rich, female    DOB: 1965-08-25, 52 y.o.   MRN: [id number]  Cough  This is a new problem. The current episode started in the past 7 days. The problem has been gradually worsening. The cough is productive of sputum (green and yellow sputum). Associated symptoms include myalgias, nasal congestion, postnasal drip, shortness of breath and wheezing. Pertinent negatives include no ear congestion, ear pain or sore throat. Associated symptoms comments: Body aches, sweats  subjective fever and chills. The symptoms are aggravated by lying down. Risk factors: nonsmoker. Treatments tried:  tylenol, nyquil. The treatment provided mild relief. Her past medical history is significant for pneumonia. There is no history of asthma, COPD or environmental allergies. no antibitoics recently     Unable to check BP given CMA cannot find extra large cuff Pulse 91, temperature 98.6 F (37 C), temperature source Oral, resp. rate 16, height 5\' 7"  (1.702 m), weight (!) 324 lb (147 kg), SpO2 98 %. Social History /Family History/Past Medical History reviewed in detail and updated in EMR if needed.  Review of Systems  HENT: Positive for postnasal drip. Negative for ear pain and sore throat.   Respiratory: Positive for cough, shortness of breath and wheezing.   Musculoskeletal: Positive for myalgias.  Allergic/Immunologic: Negative for environmental allergies.       Objective:   Physical Exam  Constitutional: Vital signs are normal. She appears well-developed and well-nourished. She is cooperative.  Non-toxic appearance. She does not appear ill. No distress.  Morbidly obese  HENT:  Head: Normocephalic.  Right Ear: Hearing, tympanic membrane, external ear and ear canal normal. Tympanic membrane is not erythematous, not retracted and not bulging.  Left Ear: Hearing, tympanic membrane, external ear and ear canal normal. Tympanic membrane is not erythematous, not retracted and  not bulging.  Nose: Mucosal edema and rhinorrhea present. Right sinus exhibits no maxillary sinus tenderness and no frontal sinus tenderness. Left sinus exhibits no maxillary sinus tenderness and no frontal sinus tenderness.  Mouth/Throat: Uvula is midline, oropharynx is clear and moist and mucous membranes are normal.  Eyes: Pupils are equal, round, and reactive to light. Conjunctivae, EOM and lids are normal. Lids are everted and swept, no foreign bodies found.  Neck: Trachea normal and normal range of motion. Neck supple. Carotid bruit is not present. No thyroid mass and no thyromegaly present.  Cardiovascular: Normal rate, regular rhythm, S1 normal, S2 normal, normal heart sounds, intact distal pulses and normal pulses. Exam reveals no gallop and no friction rub.  No murmur heard. Pulmonary/Chest: Effort normal. No tachypnea. No respiratory distress. She has no decreased breath sounds. She has wheezes. She has no rhonchi. She has no rales.  Neurological: She is alert.  Skin: Skin is warm, dry and intact. No rash noted.  Psychiatric: Her speech is normal and behavior is normal. Judgment normal. Her mood appears not anxious. Cognition and memory are normal. She does not exhibit a depressed mood.          Assessment & Plan:

## 2017-10-17 NOTE — Patient Instructions (Signed)
Complete course of antibiotics and prednisone.  Use cough suppressant at night and Mucinex DM ( not decongestant) during the day.Use albuterol as needed for wheezing and coughing fits.  Go to ER if severe shortness of breath, call if not improving as expected.

## 2017-10-30 ENCOUNTER — Encounter: Payer: Self-pay | Admitting: Family

## 2017-10-30 ENCOUNTER — Other Ambulatory Visit: Payer: BLUE CROSS/BLUE SHIELD

## 2017-10-30 ENCOUNTER — Other Ambulatory Visit (INDEPENDENT_AMBULATORY_CARE_PROVIDER_SITE_OTHER): Payer: BLUE CROSS/BLUE SHIELD

## 2017-10-30 DIAGNOSIS — E876 Hypokalemia: Secondary | ICD-10-CM

## 2017-10-30 MED FILL — POTASSIUM CL ER 20 MEQ TAB: 20 | 30 days supply | Qty: 30 | Fill #0

## 2017-10-30 NOTE — Addendum Note (Signed)
Addended by: Harl Bowie on: 10/30/2017 03:51 PM   Modules accepted: Orders

## 2017-10-31 LAB — BASIC METABOLIC PANEL
BUN: 15 mg/dL (ref 7–25)
CALCIUM: 9.2 mg/dL (ref 8.6–10.4)
CO2: 27 mmol/L (ref 20–32)
Chloride: 104 mmol/L (ref 98–110)
Creat: 0.89 mg/dL (ref 0.50–1.05)
Glucose, Bld: 120 mg/dL — ABNORMAL HIGH (ref 65–99)
POTASSIUM: 3.5 mmol/L (ref 3.5–5.3)
Sodium: 140 mmol/L (ref 135–146)

## 2017-11-13 ENCOUNTER — Telehealth: Payer: Self-pay | Admitting: *Deleted

## 2017-11-13 NOTE — Telephone Encounter (Signed)
Received Medical records from [place]; forwarded to provider/SLS 09/13

## 2017-11-24 ENCOUNTER — Inpatient Hospital Stay: Payer: BLUE CROSS/BLUE SHIELD | Admitting: Family Medicine

## 2017-11-30 ENCOUNTER — Other Ambulatory Visit: Payer: Self-pay | Admitting: Family Medicine

## 2017-12-15 ENCOUNTER — Encounter: Payer: Self-pay | Admitting: Family Medicine

## 2018-03-05 ENCOUNTER — Other Ambulatory Visit: Payer: Self-pay | Admitting: *Deleted

## 2018-03-05 MED ORDER — METOPROLOL SUCCINATE ER 100 MG PO TB24: 100.0000 mg | ORAL_TABLET | Freq: Every day | ORAL | 1 refills | Status: DC

## 2018-03-05 MED ORDER — AMLODIPINE BESYLATE-VALSARTAN 5-320 MG PO TABS: 1.0000 | ORAL_TABLET | Freq: Every day | ORAL | 1 refills | Status: DC

## 2018-03-05 MED ORDER — HYDROCHLOROTHIAZIDE 25 MG PO TABS: 25.0000 mg | ORAL_TABLET | Freq: Every day | ORAL | 1 refills | Status: DC

## 2019-04-26 LAB — COLOGUARD: COLOGUARD: NEGATIVE

## 2020-11-03 ENCOUNTER — Emergency Department (HOSPITAL_COMMUNITY)
Admission: EM | Admit: 2020-11-03 | Discharge: 2020-11-03 | Disposition: A | Discharge status: Home / Self Care | Payer: BC Managed Care – PPO | Attending: Emergency Medicine | Admitting: Emergency Medicine

## 2020-11-03 ENCOUNTER — Emergency Department (HOSPITAL_COMMUNITY): Payer: BC Managed Care – PPO

## 2020-11-03 ENCOUNTER — Other Ambulatory Visit: Payer: Self-pay

## 2020-11-03 ENCOUNTER — Encounter (HOSPITAL_COMMUNITY): Payer: Self-pay | Admitting: Radiology

## 2020-11-03 DIAGNOSIS — Z743 Need for continuous supervision: Secondary | ICD-10-CM | POA: Diagnosis not present

## 2020-11-03 DIAGNOSIS — I517 Cardiomegaly: Secondary | ICD-10-CM | POA: Diagnosis not present

## 2020-11-03 DIAGNOSIS — Z760 Encounter for issue of repeat prescription: Secondary | ICD-10-CM | POA: Insufficient documentation

## 2020-11-03 DIAGNOSIS — E876 Hypokalemia: Secondary | ICD-10-CM | POA: Insufficient documentation

## 2020-11-03 DIAGNOSIS — E119 Type 2 diabetes mellitus without complications: Secondary | ICD-10-CM | POA: Insufficient documentation

## 2020-11-03 DIAGNOSIS — Z79899 Other long term (current) drug therapy: Secondary | ICD-10-CM | POA: Diagnosis not present

## 2020-11-03 DIAGNOSIS — I10 Essential (primary) hypertension: Secondary | ICD-10-CM | POA: Diagnosis not present

## 2020-11-03 DIAGNOSIS — R0602 Shortness of breath: Secondary | ICD-10-CM | POA: Diagnosis not present

## 2020-11-03 DIAGNOSIS — R0600 Dyspnea, unspecified: Secondary | ICD-10-CM | POA: Diagnosis not present

## 2020-11-03 DIAGNOSIS — J189 Pneumonia, unspecified organism: Secondary | ICD-10-CM | POA: Diagnosis not present

## 2020-11-03 DIAGNOSIS — R609 Edema, unspecified: Secondary | ICD-10-CM | POA: Diagnosis not present

## 2020-11-03 DIAGNOSIS — I499 Cardiac arrhythmia, unspecified: Secondary | ICD-10-CM | POA: Diagnosis not present

## 2020-11-03 LAB — CBC WITH DIFFERENTIAL/PLATELET
Abs Immature Granulocytes: 0.03 10*3/uL (ref 0.00–0.07)
Basophils Absolute: 0 10*3/uL (ref 0.0–0.1)
Basophils Relative: 1 %
Eosinophils Absolute: 0.1 10*3/uL (ref 0.0–0.5)
Eosinophils Relative: 2 %
HCT: 42 % (ref 36.0–46.0)
Hemoglobin: 12.9 g/dL (ref 12.0–15.0)
Immature Granulocytes: 1 %
Lymphocytes Relative: 39 %
Lymphs Abs: 2.6 10*3/uL (ref 0.7–4.0)
MCH: 23.2 pg — ABNORMAL LOW (ref 26.0–34.0)
MCHC: 30.7 g/dL (ref 30.0–36.0)
MCV: 75.4 fL — ABNORMAL LOW (ref 80.0–100.0)
Monocytes Absolute: 0.5 10*3/uL (ref 0.1–1.0)
Monocytes Relative: 8 %
Neutro Abs: 3.3 10*3/uL (ref 1.7–7.7)
Neutrophils Relative %: 49 %
Platelets: 224 10*3/uL (ref 150–400)
RBC: 5.57 MIL/uL — ABNORMAL HIGH (ref 3.87–5.11)
RDW: 17 % — ABNORMAL HIGH (ref 11.5–15.5)
WBC: 6.6 10*3/uL (ref 4.0–10.5)
nRBC: 0 % (ref 0.0–0.2)

## 2020-11-03 LAB — BASIC METABOLIC PANEL
Anion gap: 8 (ref 5–15)
BUN: 17 mg/dL (ref 6–20)
CO2: 28 mmol/L (ref 22–32)
Calcium: 9.1 mg/dL (ref 8.9–10.3)
Chloride: 108 mmol/L (ref 98–111)
Creatinine, Ser: 0.86 mg/dL (ref 0.44–1.00)
GFR, Estimated: >60 mL/min (ref >60)
Glucose, Bld: 113 mg/dL — ABNORMAL HIGH (ref 70–99)
Potassium: 3.2 mmol/L — ABNORMAL LOW (ref 3.5–5.1)
Sodium: 144 mmol/L (ref 135–145)

## 2020-11-03 MED ORDER — METOPROLOL SUCCINATE ER 50 MG PO TB24
100.0000 mg | ORAL_TABLET | Freq: Every day | ORAL | Status: DC
  Administered 2020-11-03: 100 mg via ORAL
  Filled 2020-11-03: qty 2

## 2020-11-03 MED ORDER — POTASSIUM CHLORIDE CRYS ER 20 MEQ PO TBCR
40.0000 meq | EXTENDED_RELEASE_TABLET | Freq: Once | ORAL | Status: AC
  Administered 2020-11-03: 40 meq via ORAL
  Filled 2020-11-03: qty 2

## 2020-11-03 MED ORDER — METOPROLOL SUCCINATE ER 100 MG PO TB24: 100.0000 mg | ORAL_TABLET | Freq: Every day | ORAL | 0 refills | Status: DC

## 2020-11-03 MED ORDER — LOSARTAN POTASSIUM 50 MG PO TABS: 50.0000 mg | ORAL_TABLET | Freq: Every day | ORAL | 0 refills | Status: DC

## 2020-11-03 MED ORDER — LOSARTAN POTASSIUM 25 MG PO TABS
50.0000 mg | ORAL_TABLET | Freq: Once | ORAL | Status: AC
  Administered 2020-11-03: 50 mg via ORAL
  Filled 2020-11-03: qty 2

## 2020-11-03 MED ORDER — HYDROCHLOROTHIAZIDE 12.5 MG PO CAPS
25.0000 mg | ORAL_CAPSULE | Freq: Once | ORAL | Status: AC
  Administered 2020-11-03: 25 mg via ORAL
  Filled 2020-11-03: qty 2

## 2020-11-03 MED ORDER — HYDROCHLOROTHIAZIDE 25 MG PO TABS: 25.0000 mg | ORAL_TABLET | Freq: Every day | ORAL | 0 refills | Status: DC

## 2020-11-03 NOTE — ED Provider Notes (Signed)
[facility] DEPT Provider Note: Georgena Spurling, MD, FACEP  CSN: [id number] MRN: [id number] ARRIVAL: 11/03/20 at [place]: [place]  11/03/20 5:06 AM Jennifer Rich is a 55 y.o. female who has been out of her losartan 50 mg and HCTZ 25 mg for several days.  This morning she awakened with shortness of breath.  She noticed it when she was ambulating to the bathroom.  Symptoms are not severe and she is not having associated chest pain.  On arrival her blood pressure was noted to be 214/124 but on recheck is 184/93.  She also notices some swelling in her legs.  She denies headache, focal numbness or weakness, visual changes, chest pain or shortness of breath.   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   BLASTOCYSTIS HOMINIS     CAP (community acquired pneumonia) 04/17/2015   Cervical cancer screening 01/25/2013   Chicken pox as a child   Goiter    Mildly enlarged thyroid TSH normal    Hyperglycemia    Hyperlipidemia    Hypertension    Obesity    Obesity, unspecified 04/26/2013   Pneumonia 05/02/2013   Preventative health care 12/18/2012   Has seen Dr Carlota Raspberry for GYN in past   Thyroid disease    No per pt 05/25/15   Type II or unspecified type diabetes mellitus without mention of complication, not stated as uncontrolled    Vitamin D deficiency     Past Surgical History:  Procedure Laterality Date   None     WISDOM TOOTH EXTRACTION  55 yrs old    Family History  Problem Relation Age of Onset   Heart attack Maternal Grandfather 75   Heart disease Maternal Grandfather    Diabetes Maternal Grandmother        type 2   Blindness Maternal Grandmother    Hypertension Paternal Grandmother    Stroke Paternal Grandfather    Hypertension Sister    Hypertension Sister     Social History   Tobacco Use   Smoking  status: Never   Smokeless tobacco: Never  Vaping Use   Vaping Use: Never used  Substance Use Topics   Alcohol use: No   Drug use: No    Prior to Admission medications   Medication Sig Start Date End Date Taking? Authorizing Provider  hydrochlorothiazide (HYDRODIURIL) 25 MG tablet Take 1 tablet (25 mg total) by mouth daily. 11/03/20  Yes Solyana Nonaka, MD  losartan (COZAAR) 50 MG tablet Take 1 tablet (50 mg total) by mouth daily. 11/03/20  Yes Ashanti Littles, MD  metoprolol succinate (TOPROL-XL) 100 MG 24 hr tablet Take 1 tablet (100 mg total) by mouth daily. 11/03/20  Yes Osmar Howton, MD    Allergies Patient has no known allergies.   REVIEW OF SYSTEMS  Negative except as noted here or in the History of Present Illness.   PHYSICAL EXAMINATION  Initial Vital Signs Blood pressure (!) 184/93, pulse 93, temperature 98.6 F (37 C), temperature source Oral, resp. rate 20, height '5\' 7"'$  (1.702 m), weight (!) 142.9 kg, SpO2 97 %.  Examination General: Well-developed, high BMI female in no acute distress; appearance consistent with age of record HENT: normocephalic; atraumatic Eyes: pupils equal, round and reactive to light; extraocular muscles intact Neck: supple Heart: regular rate and rhythm Lungs: clear to auscultation bilaterally  Abdomen: soft; nondistended; nontender; bowel sounds present Extremities: No deformity; full range of motion; trace edema of lower legs Neurologic: Awake, alert and oriented; motor function intact in all extremities and symmetric; no facial droop Skin: Warm and dry Psychiatric: Normal mood and affect   RESULTS  Summary of this visit's results, reviewed and interpreted by myself:   EKG Interpretation  Date/Time:    Ventricular Rate:    PR Interval:    QRS Duration:   QT Interval:    QTC Calculation:   R Axis:     Text Interpretation:         Laboratory Studies: Results for orders placed or performed during the hospital encounter of 11/03/20  (from the past 24 hour(s))  CBC with Differential/Platelet     Status: Abnormal   Collection Time: 11/03/20  5:38 AM  Result Value Ref Range   WBC 6.6 4.0 - 10.5 K/uL   RBC 5.57 (H) 3.87 - 5.11 MIL/uL   Hemoglobin 12.9 12.0 - 15.0 g/dL   HCT 42.0 36.0 - 46.0 %   MCV 75.4 (L) 80.0 - 100.0 fL   MCH 23.2 (L) 26.0 - 34.0 pg   MCHC 30.7 30.0 - 36.0 g/dL   RDW 17.0 (H) 11.5 - 15.5 %   Platelets 224 150 - 400 K/uL   nRBC 0.0 0.0 - 0.2 %   Neutrophils Relative % 49 %   Neutro Abs 3.3 1.7 - 7.7 K/uL   Lymphocytes Relative 39 %   Lymphs Abs 2.6 0.7 - 4.0 K/uL   Monocytes Relative 8 %   Monocytes Absolute 0.5 0.1 - 1.0 K/uL   Eosinophils Relative 2 %   Eosinophils Absolute 0.1 0.0 - 0.5 K/uL   Basophils Relative 1 %   Basophils Absolute 0.0 0.0 - 0.1 K/uL   Immature Granulocytes 1 %   Abs Immature Granulocytes 0.03 0.00 - 0.07 K/uL  Basic metabolic panel     Status: Abnormal   Collection Time: 11/03/20  5:38 AM  Result Value Ref Range   Sodium 144 135 - 145 mmol/L   Potassium 3.2 (L) 3.5 - 5.1 mmol/L   Chloride 108 98 - 111 mmol/L   CO2 28 22 - 32 mmol/L   Glucose, Bld 113 (H) 70 - 99 mg/dL   BUN 17 6 - 20 mg/dL   Creatinine, Ser 0.86 0.44 - 1.00 mg/dL   Calcium 9.1 8.9 - 10.3 mg/dL   GFR, Estimated >60 >60 mL/min   Anion gap 8 5 - 15   Imaging Studies: DG Chest 2 View  Result Date: 11/03/2020 CLINICAL DATA:  Dyspnea today EXAM: CHEST - 2 VIEW COMPARISON:  06/11/2015 chest radiograph. FINDINGS: Stable cardiomediastinal silhouette with mild cardiomegaly. No pneumothorax. No pleural effusion. No overt pulmonary edema. No acute consolidative airspace disease. IMPRESSION: Stable mild cardiomegaly without overt pulmonary edema. No active pulmonary disease. Electronically Signed   By: Ilona Sorrel M.D.   On: 11/03/2020 05:37    ED COURSE and MDM  Nursing notes, initial and subsequent vitals signs, including pulse oximetry, reviewed and interpreted by myself.  Vitals:   11/03/20 0454  11/03/20 0456 11/03/20 0545 11/03/20 0630  BP:  (!) 184/93 (!) 198/116 (!) 177/111  Pulse:  93 88 90  Resp:  '20 20 20  '$ Temp:  98.6 F (37 C)    TempSrc:  Oral    SpO2:  97% 99% 97%  Weight: (!) 142.9 kg     Height: '5\' 7"'$  (1.702 m)  Medications  metoprolol succinate (TOPROL-XL) 24 hr tablet 100 mg (has no administration in time range)  hydrochlorothiazide (MICROZIDE) capsule 25 mg (25 mg Oral Given 11/03/20 0544)  losartan (COZAAR) tablet 50 mg (50 mg Oral Given 11/03/20 0544)  potassium chloride SA (KLOR-CON) CR tablet 40 mEq (40 mEq Oral Given 11/03/20 0647)   7:18 AM Patient's blood pressure medications were refilled and she was given her initial doses in the ED.  She was also given a dose of K-Dur for mild hypokalemia.  She would like to reestablish Vivien Rossetti, MD as her primary care physician and note to this effect was sent to Dr. Maryellen Pile.  I believe she is stable for discharge home as she is not showing signs of endorgan damage.   PROCEDURES  Procedures   ED DIAGNOSES     ICD-10-CM   1. Hypertension not at goal  I10     2. Medication refill  Z76.0     3. Hypokalemia  E87.6          Lasheba Stevens, Jenny Reichmann, MD 11/03/20 [phone]

## 2020-11-03 NOTE — ED Triage Notes (Signed)
Pt states that today will be the third day that she has been without her losartan and hctz medication due to running out. Pt woke with shortness of breath tonight.

## 2020-11-09 DIAGNOSIS — Z79899 Other long term (current) drug therapy: Secondary | ICD-10-CM | POA: Diagnosis not present

## 2020-11-09 DIAGNOSIS — M25511 Pain in right shoulder: Secondary | ICD-10-CM | POA: Diagnosis not present

## 2020-11-09 DIAGNOSIS — S99921A Unspecified injury of right foot, initial encounter: Secondary | ICD-10-CM | POA: Diagnosis not present

## 2020-11-09 DIAGNOSIS — M1711 Unilateral primary osteoarthritis, right knee: Secondary | ICD-10-CM | POA: Diagnosis not present

## 2020-11-09 DIAGNOSIS — K760 Fatty (change of) liver, not elsewhere classified: Secondary | ICD-10-CM | POA: Diagnosis not present

## 2020-11-09 DIAGNOSIS — S80211A Abrasion, right knee, initial encounter: Secondary | ICD-10-CM | POA: Diagnosis not present

## 2020-11-09 DIAGNOSIS — S93401A Sprain of unspecified ligament of right ankle, initial encounter: Secondary | ICD-10-CM | POA: Diagnosis not present

## 2020-11-09 DIAGNOSIS — S40011A Contusion of right shoulder, initial encounter: Secondary | ICD-10-CM | POA: Diagnosis not present

## 2020-11-09 DIAGNOSIS — S4991XA Unspecified injury of right shoulder and upper arm, initial encounter: Secondary | ICD-10-CM | POA: Diagnosis not present

## 2020-11-09 DIAGNOSIS — S99911A Unspecified injury of right ankle, initial encounter: Secondary | ICD-10-CM | POA: Diagnosis not present

## 2020-11-09 DIAGNOSIS — Z888 Allergy status to other drugs, medicaments and biological substances status: Secondary | ICD-10-CM | POA: Diagnosis not present

## 2020-11-09 DIAGNOSIS — M25572 Pain in left ankle and joints of left foot: Secondary | ICD-10-CM | POA: Diagnosis not present

## 2020-11-09 DIAGNOSIS — M79601 Pain in right arm: Secondary | ICD-10-CM | POA: Diagnosis not present

## 2020-11-09 DIAGNOSIS — S8991XA Unspecified injury of right lower leg, initial encounter: Secondary | ICD-10-CM | POA: Diagnosis not present

## 2020-11-09 DIAGNOSIS — W010XXA Fall on same level from slipping, tripping and stumbling without subsequent striking against object, initial encounter: Secondary | ICD-10-CM | POA: Diagnosis not present

## 2020-11-09 DIAGNOSIS — M79671 Pain in right foot: Secondary | ICD-10-CM | POA: Diagnosis not present

## 2020-11-09 DIAGNOSIS — M25571 Pain in right ankle and joints of right foot: Secondary | ICD-10-CM | POA: Diagnosis not present

## 2020-11-09 DIAGNOSIS — M25561 Pain in right knee: Secondary | ICD-10-CM | POA: Diagnosis not present

## 2020-11-09 DIAGNOSIS — S99912A Unspecified injury of left ankle, initial encounter: Secondary | ICD-10-CM | POA: Diagnosis not present

## 2020-11-09 DIAGNOSIS — B1920 Unspecified viral hepatitis C without hepatic coma: Secondary | ICD-10-CM | POA: Diagnosis not present

## 2020-11-09 DIAGNOSIS — G4733 Obstructive sleep apnea (adult) (pediatric): Secondary | ICD-10-CM | POA: Diagnosis not present

## 2020-11-10 DIAGNOSIS — S4991XA Unspecified injury of right shoulder and upper arm, initial encounter: Secondary | ICD-10-CM | POA: Diagnosis not present

## 2020-11-10 DIAGNOSIS — M25572 Pain in left ankle and joints of left foot: Secondary | ICD-10-CM | POA: Diagnosis not present

## 2020-11-10 DIAGNOSIS — M79671 Pain in right foot: Secondary | ICD-10-CM | POA: Diagnosis not present

## 2020-11-10 DIAGNOSIS — S8991XA Unspecified injury of right lower leg, initial encounter: Secondary | ICD-10-CM | POA: Diagnosis not present

## 2020-11-10 DIAGNOSIS — S99921A Unspecified injury of right foot, initial encounter: Secondary | ICD-10-CM | POA: Diagnosis not present

## 2020-11-10 DIAGNOSIS — M25511 Pain in right shoulder: Secondary | ICD-10-CM | POA: Diagnosis not present

## 2020-11-10 DIAGNOSIS — M25561 Pain in right knee: Secondary | ICD-10-CM | POA: Diagnosis not present

## 2020-11-10 DIAGNOSIS — S99911A Unspecified injury of right ankle, initial encounter: Secondary | ICD-10-CM | POA: Diagnosis not present

## 2020-11-10 DIAGNOSIS — M25571 Pain in right ankle and joints of right foot: Secondary | ICD-10-CM | POA: Diagnosis not present

## 2020-11-10 DIAGNOSIS — S99912A Unspecified injury of left ankle, initial encounter: Secondary | ICD-10-CM | POA: Diagnosis not present

## 2020-11-16 ENCOUNTER — Encounter: Payer: Self-pay | Admitting: Family Medicine

## 2020-11-27 ENCOUNTER — Encounter: Payer: Self-pay | Admitting: Family Medicine

## 2020-11-28 DIAGNOSIS — R197 Diarrhea, unspecified: Secondary | ICD-10-CM | POA: Diagnosis not present

## 2020-11-28 DIAGNOSIS — A084 Viral intestinal infection, unspecified: Secondary | ICD-10-CM | POA: Diagnosis not present

## 2020-11-29 ENCOUNTER — Other Ambulatory Visit: Payer: Self-pay

## 2020-11-29 ENCOUNTER — Encounter: Payer: BC Managed Care – PPO | Admitting: Family

## 2020-11-29 NOTE — Progress Notes (Signed)
Erroneous encounter

## 2020-12-11 ENCOUNTER — Other Ambulatory Visit: Payer: Self-pay

## 2020-12-11 ENCOUNTER — Emergency Department (HOSPITAL_BASED_OUTPATIENT_CLINIC_OR_DEPARTMENT_OTHER): Payer: BC Managed Care – PPO

## 2020-12-11 ENCOUNTER — Encounter (HOSPITAL_BASED_OUTPATIENT_CLINIC_OR_DEPARTMENT_OTHER): Payer: Self-pay

## 2020-12-11 ENCOUNTER — Emergency Department (HOSPITAL_BASED_OUTPATIENT_CLINIC_OR_DEPARTMENT_OTHER)
Admission: EM | Admit: 2020-12-11 | Discharge: 2020-12-11 | Disposition: A | Discharge status: Home / Self Care | Payer: BC Managed Care – PPO | Attending: Emergency Medicine | Admitting: Emergency Medicine

## 2020-12-11 DIAGNOSIS — I517 Cardiomegaly: Secondary | ICD-10-CM | POA: Diagnosis not present

## 2020-12-11 DIAGNOSIS — I10 Essential (primary) hypertension: Secondary | ICD-10-CM | POA: Diagnosis not present

## 2020-12-11 DIAGNOSIS — Z20822 Contact with and (suspected) exposure to covid-19: Secondary | ICD-10-CM | POA: Insufficient documentation

## 2020-12-11 DIAGNOSIS — Z79899 Other long term (current) drug therapy: Secondary | ICD-10-CM | POA: Diagnosis not present

## 2020-12-11 DIAGNOSIS — J111 Influenza due to unidentified influenza virus with other respiratory manifestations: Secondary | ICD-10-CM | POA: Diagnosis not present

## 2020-12-11 DIAGNOSIS — J208 Acute bronchitis due to other specified organisms: Secondary | ICD-10-CM | POA: Insufficient documentation

## 2020-12-11 DIAGNOSIS — R059 Cough, unspecified: Secondary | ICD-10-CM | POA: Diagnosis not present

## 2020-12-11 DIAGNOSIS — R051 Acute cough: Secondary | ICD-10-CM | POA: Diagnosis not present

## 2020-12-11 DIAGNOSIS — E119 Type 2 diabetes mellitus without complications: Secondary | ICD-10-CM | POA: Insufficient documentation

## 2020-12-11 HISTORY — DX: Unspecified viral hepatitis C without hepatic coma: B19.20

## 2020-12-11 HISTORY — DX: Fatty (change of) liver, not elsewhere classified: K76.0

## 2020-12-11 LAB — RESP PANEL BY RT-PCR (FLU A&B, COVID) ARPGX2
Influenza A by PCR: NEGATIVE
Influenza B by PCR: NEGATIVE
SARS Coronavirus 2 by RT PCR: NEGATIVE

## 2020-12-11 MED ORDER — ALBUTEROL SULFATE HFA 108 (90 BASE) MCG/ACT IN AERS: 1.0000 | INHALATION_SPRAY | Freq: Four times a day (QID) | RESPIRATORY_TRACT | 0 refills | Status: DC | PRN

## 2020-12-11 MED ORDER — IPRATROPIUM-ALBUTEROL 0.5-2.5 (3) MG/3ML IN SOLN
3.0000 mL | Freq: Once | RESPIRATORY_TRACT | Status: AC
  Administered 2020-12-11: 3 mL via RESPIRATORY_TRACT
  Filled 2020-12-11: qty 3

## 2020-12-11 MED ORDER — PROMETHAZINE-DM 6.25-15 MG/5ML PO SYRP: 5.0000 mL | ORAL_SOLUTION | Freq: Four times a day (QID) | ORAL | 0 refills | Status: AC | PRN

## 2020-12-11 NOTE — ED Notes (Signed)
Patient in xray HHN delayed

## 2020-12-11 NOTE — ED Notes (Signed)
Seen before triage, SpO2 97%, HR 102, BBS with faint exp wheezes, congested cough.

## 2020-12-11 NOTE — ED Provider Notes (Signed)
[facility] Provider Note   CSN: [id number] Arrival date & time: 12/11/20  1458     History Chief Complaint  Patient presents with   Cough    Jennifer Rich is a 55 y.o. female.  Patient has a past medical history of hypertension, obesity, hyperlipidemia. She presents the ED with 1 day of cough.  She says it started yesterday and is gradually worsened since then.  It is productive but she is having trouble getting the mucus up.  She has associated chest tightness and feels like her lungs are full of mucus.  She has tried some cough medicine with no relief.  She has been having associated wheezing and difficulties breathing especially when laying flat.  She states that she started having body aches today.  She tested herself for COVID 2 times at home and was both were negative. Nobody else around her has been sick.  She denies any associated congestion, sore throat, fever, chills.   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   BLASTOCYSTIS HOMINIS     CAP (community acquired pneumonia) 04/17/2015   Cervical cancer screening 01/25/2013   Chicken pox as a child   Fatty liver    Goiter    Mildly enlarged thyroid TSH normal    Hepatitis C    Hyperglycemia    Hyperlipidemia    Hypertension    Obesity    Obesity, unspecified 04/26/2013   Pneumonia 05/02/2013   Preventative health care 12/18/2012   Has seen Dr Carlota Raspberry for GYN in past   Thyroid disease    No per pt 05/25/15   Type II or unspecified type diabetes mellitus without mention of complication, not stated as uncontrolled    Vitamin D deficiency     Patient Active Problem List   Diagnosis Date Noted   Cough 10/17/2017    Rash 01/18/2017   Essential hypertension, benign 11/02/2013   Acute bronchitis 10/31/2013   Atypical chest pain 10/31/2013   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     Family History  Problem Relation Age of Onset   Heart attack Maternal Grandfather 61   Heart disease Maternal Grandfather    Diabetes Maternal Grandmother        type 2   Blindness Maternal Grandmother    Hypertension Paternal Grandmother    Stroke Paternal Grandfather    Hypertension Sister    Hypertension Sister     Social History   Tobacco Use   Smoking status: Never   Smokeless tobacco: Never  Vaping Use   Vaping Use: Never used  Substance Use Topics   Alcohol use: No   Drug use: No    Home Medications Prior to Admission medications   Medication Sig  Start Date End Date Taking? Authorizing Provider  albuterol (VENTOLIN HFA) 108 (90 Base) MCG/ACT inhaler Inhale 1-2 puffs into the lungs every 6 (six) hours as needed for wheezing or shortness of breath. 12/11/20  Yes Skyler Carel, Adora Fridge, PA-C  promethazine-dextromethorphan (PROMETHAZINE-DM) 6.25-15 MG/5ML syrup Take 5 mLs by mouth 4 (four) times daily as needed for up to 10 days for cough. 12/11/20 12/21/20 Yes Tanay Massiah, Adora Fridge, PA-C  hydrochlorothiazide (HYDRODIURIL) 25 MG tablet Take 1 tablet (25 mg total) by mouth daily. 11/03/20   Molpus, John, MD  losartan (COZAAR) 50 MG tablet Take 1 tablet (50 mg total) by mouth daily. 11/03/20   Molpus, John, MD  metoprolol succinate (TOPROL-XL) 100 MG 24 hr tablet Take 1 tablet (100 mg total) by mouth daily. 11/03/20   Molpus, Jenny Reichmann, MD     Allergies    Patient has no known allergies.  Review of Systems   Review of Systems  Constitutional:  Negative for chills and fever.  HENT:  Negative for congestion, rhinorrhea and sore throat.   Eyes:  Negative for visual disturbance.  Respiratory:  Positive for cough, chest tightness and wheezing. Negative for shortness of breath.   Cardiovascular:  Negative for chest pain, palpitations and leg swelling.  Gastrointestinal:  Negative for abdominal pain, blood in stool, constipation, diarrhea, nausea and vomiting.  Genitourinary:  Negative for dysuria, flank pain and hematuria.  Musculoskeletal:  Negative for back pain.  Skin:  Negative for rash and wound.  Neurological:  Negative for dizziness, syncope, weakness, light-headedness and headaches.  Psychiatric/Behavioral:  Negative for confusion.   All other systems reviewed and are negative.  Physical Exam Updated Vital Signs BP (!) 186/108 (BP Location: Left Wrist)   Pulse 92   Temp 98.9 F (37.2 C) (Oral)   Resp 16 Comment: during HHN treatment  Ht 5\' 7"  (1.702 m)   Wt (!) 150.6 kg   SpO2 100%   BMI 52.00 kg/m   Physical Exam Vitals and nursing note reviewed.  Constitutional:      General: She is not in acute distress.    Appearance: Normal appearance. She is not ill-appearing, toxic-appearing or diaphoretic.  HENT:     Head: Normocephalic and atraumatic.     Nose: Nose normal. No nasal deformity, congestion or rhinorrhea.     Mouth/Throat:     Lips: Pink. No lesions.     Mouth: Mucous membranes are moist. No injury, lacerations, oral lesions or angioedema.     Pharynx: Oropharynx is clear. Uvula midline. No pharyngeal swelling, oropharyngeal exudate, posterior oropharyngeal erythema or uvula swelling.  Eyes:     General: Gaze aligned appropriately. No scleral icterus.       Right eye: No discharge.        Left eye: No discharge.     Conjunctiva/sclera: Conjunctivae normal.     Right eye: Right conjunctiva is  not injected. No exudate or hemorrhage.    Left eye: Left conjunctiva is not injected. No exudate or hemorrhage. Cardiovascular:     Rate and Rhythm: Normal rate and regular rhythm.     Pulses:          Radial pulses are 2+ on the right side and 2+ on the left side.       Dorsalis pedis pulses are 2+ on the right side and 2+ on the left side.     Heart sounds: Normal heart sounds, S1 normal and S2 normal. Heart sounds not distant. No murmur heard.   No friction  rub. No gallop. No S3 or S4 sounds.  Pulmonary:     Effort: Pulmonary effort is normal. No accessory muscle usage or respiratory distress.     Breath sounds: No stridor. Examination of the right-upper field reveals wheezing and rhonchi. Examination of the left-upper field reveals wheezing and rhonchi. Examination of the right-middle field reveals wheezing and rhonchi. Examination of the left-middle field reveals wheezing and rhonchi. Wheezing and rhonchi present. No rales.     Comments: Chest rises equal bilaterally.  Lung sounds with notable wheezing and rhonchi present in upper and middle lung fields bilaterally.  Patient has normal work of breathing and effort.  She is not tachypneic.  No stridor or respiratory distress noted.  Productive cough heard while patient was in the room. Chest:     Chest wall: No tenderness.  Abdominal:     General: Abdomen is flat. Bowel sounds are normal. There is no distension.     Palpations: Abdomen is soft. There is no mass or pulsatile mass.     Tenderness: There is no abdominal tenderness. There is no guarding or rebound.  Musculoskeletal:     Cervical back: Neck supple.     Right lower leg: No edema.     Left lower leg: No edema.  Lymphadenopathy:     Cervical: No cervical adenopathy.  Skin:    General: Skin is warm and dry.     Coloration: Skin is not jaundiced or pale.     Findings: No bruising, erythema, lesion or rash.  Neurological:     General: No focal deficit present.     Mental  Status: She is alert and oriented to person, place, and time.     GCS: GCS eye subscore is 4. GCS verbal subscore is 5. GCS motor subscore is 6.  Psychiatric:        Mood and Affect: Mood normal.        Behavior: Behavior normal. Behavior is cooperative.    ED Results / Procedures / Treatments   Labs (all labs ordered are listed, but only abnormal results are displayed) Labs Reviewed  RESP PANEL BY RT-PCR (FLU A&B, COVID) ARPGX2    EKG None  Radiology DG Chest 2 View  Result Date: 12/11/2020 CLINICAL DATA:  Flu like symptoms since yesterday EXAM: CHEST - 2 VIEW COMPARISON:  11/03/2020 FINDINGS: Frontal and lateral views of the chest demonstrate stable enlargement of the cardiac silhouette. No acute airspace disease, effusion, or pneumothorax. No acute bony abnormalities. IMPRESSION: 1. No acute intrathoracic process. Electronically Signed   By: Randa Ngo M.D.   On: 12/11/2020 17:22    Procedures Procedures   Medications Ordered in ED Medications  ipratropium-albuterol (DUONEB) 0.5-2.5 (3) MG/3ML nebulizer solution 3 mL (3 mLs Nebulization Given 12/11/20 1654)    ED Course  I have reviewed the triage vital signs and the nursing notes.  Pertinent labs & imaging results that were available during my care of the patient were reviewed by me and considered in my medical decision making (see chart for details).    MDM Rules/Calculators/A&P                          Is a 55 year old female presents the ED with chief complaint of a cough started yesterday.  She is afebrile on arrival to the ED.  She is hypertensive to 190/101.  I spoke with patient regarding this and she states that she has just taken her blood pressure  medication and she has hypertension at baseline.  She has no signs of endorgan damage.  Her vitals are otherwise hemodynamically stable. Exam with wheezing and rhonchi present in upper and middle lung fields bilaterally.  Patient appears to be in no acute distress.   HEENT exam with no abnormalities.  Plan to obtain chest x-ray to evaluate for possible pneumonia versus bronchitis.  Will obtain COVID and flu testing.  Will receive DuoNeb while in the emergency department to help with wheezing.  COVID and flu were negative.  Checks x-ray with no evidence of pneumonia.  Patient is remained afebrile throughout her time in the ED.  On reassessment, lung sounds are clear patient feels much better after having nebulizer treatment.  I think patient likely has acute bronchitis.  Will send home with cough medication and inhaler for symptom management.  Given that there is no fever and low suspicion for PNA, do not think that antibiotics are indicated at this time.  Final Clinical Impression(s) / ED Diagnoses Final diagnoses:  Acute cough  Acute bronchitis due to other specified organisms    Rx / DC Orders ED Discharge Orders          Ordered    promethazine-dextromethorphan (PROMETHAZINE-DM) 6.25-15 MG/5ML syrup  4 times daily PRN        12/11/20 1734    albuterol (VENTOLIN HFA) 108 (90 Base) MCG/ACT inhaler  Every 6 hours PRN        12/11/20 1734             Adolphus Birchwood, PA-C 12/11/20 1738    Lucrezia Starch, MD 12/12/20 1654

## 2020-12-11 NOTE — Discharge Instructions (Addendum)
Recommend supportive treatment such as throat lozenges or honey with tea or hot water that you can buy over the counter.  I have sent you home with a prescription for Albuterol inhaler that you can take if you feel wheezy and a cough syrup medication.  If you develop worsening symptoms such as worsening shortness of breath or fevers, please return to the ED.

## 2020-12-11 NOTE — ED Notes (Signed)
Pt transported to Xray. 

## 2020-12-11 NOTE — ED Triage Notes (Signed)
Pt c/o flu like sx started last night-pt reports covid home test neg x 2-NAD-steady gait-RT assessment in ED WR

## 2021-01-14 DIAGNOSIS — I10 Essential (primary) hypertension: Secondary | ICD-10-CM | POA: Diagnosis not present

## 2021-01-14 DIAGNOSIS — B009 Herpesviral infection, unspecified: Secondary | ICD-10-CM | POA: Diagnosis not present

## 2021-01-14 DIAGNOSIS — B1920 Unspecified viral hepatitis C without hepatic coma: Secondary | ICD-10-CM | POA: Diagnosis not present

## 2021-01-14 DIAGNOSIS — Z23 Encounter for immunization: Secondary | ICD-10-CM | POA: Diagnosis not present

## 2021-01-14 DIAGNOSIS — R7303 Prediabetes: Secondary | ICD-10-CM | POA: Diagnosis not present

## 2021-01-14 DIAGNOSIS — E785 Hyperlipidemia, unspecified: Secondary | ICD-10-CM | POA: Diagnosis not present

## 2021-01-22 ENCOUNTER — Other Ambulatory Visit: Payer: Self-pay

## 2021-01-22 ENCOUNTER — Encounter (HOSPITAL_BASED_OUTPATIENT_CLINIC_OR_DEPARTMENT_OTHER): Payer: Self-pay

## 2021-01-22 ENCOUNTER — Emergency Department (HOSPITAL_BASED_OUTPATIENT_CLINIC_OR_DEPARTMENT_OTHER)
Admission: EM | Admit: 2021-01-22 | Discharge: 2021-01-22 | Disposition: A | Discharge status: Home / Self Care | Payer: BC Managed Care – PPO | Attending: Emergency Medicine | Admitting: Emergency Medicine

## 2021-01-22 DIAGNOSIS — R059 Cough, unspecified: Secondary | ICD-10-CM | POA: Diagnosis not present

## 2021-01-22 DIAGNOSIS — Z79899 Other long term (current) drug therapy: Secondary | ICD-10-CM | POA: Diagnosis not present

## 2021-01-22 DIAGNOSIS — Z20822 Contact with and (suspected) exposure to covid-19: Secondary | ICD-10-CM | POA: Insufficient documentation

## 2021-01-22 DIAGNOSIS — E119 Type 2 diabetes mellitus without complications: Secondary | ICD-10-CM | POA: Diagnosis not present

## 2021-01-22 DIAGNOSIS — J101 Influenza due to other identified influenza virus with other respiratory manifestations: Secondary | ICD-10-CM | POA: Insufficient documentation

## 2021-01-22 DIAGNOSIS — I10 Essential (primary) hypertension: Secondary | ICD-10-CM | POA: Insufficient documentation

## 2021-01-22 LAB — RESP PANEL BY RT-PCR (FLU A&B, COVID) ARPGX2
Influenza A by PCR: POSITIVE — AB
Influenza B by PCR: NEGATIVE
SARS Coronavirus 2 by RT PCR: NEGATIVE

## 2021-01-22 MED ORDER — OSELTAMIVIR PHOSPHATE 75 MG PO CAPS: 75.0000 mg | ORAL_CAPSULE | Freq: Two times a day (BID) | ORAL | 0 refills | Status: DC

## 2021-01-22 MED ORDER — GUAIFENESIN 100 MG/5ML PO LIQD: 100.0000 mg | ORAL | 0 refills | Status: DC | PRN

## 2021-01-22 MED ORDER — ALBUTEROL SULFATE HFA 108 (90 BASE) MCG/ACT IN AERS: 1.0000 | INHALATION_SPRAY | Freq: Four times a day (QID) | RESPIRATORY_TRACT | 0 refills | Status: DC | PRN

## 2021-01-22 NOTE — ED Triage Notes (Signed)
Pt c/o flu like sx day 3-NAD-steady gait

## 2021-01-22 NOTE — Discharge Instructions (Addendum)
The test is positive for flu.  Your symptoms are consistent with the flu.  Please hydrate well. Take Tylenol or ibuprofen for fevers and body aches.  Mucinex is prescribed for severe cough/congestion. Albuterol as prescribed for wheezing, use it as needed.

## 2021-01-22 NOTE — ED Provider Notes (Addendum)
[facility] Provider Note   CSN: [id number] Arrival date & time: 01/22/21  1154     History Chief Complaint  Patient presents with   Cough    Jennifer Rich is a 55 y.o. female.  HPI     55 year old female comes in with chief complaint of cough.  Patient has history of bronchitis.  She reports that over the last 3 days she has been having cough, headaches, congestion, body aches, weakness.  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  She is vaccinated against COVID-19 and influenza.  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   BLASTOCYSTIS HOMINIS     CAP (community acquired pneumonia) 04/17/2015   Cervical cancer screening 01/25/2013   Chicken pox as a child   Fatty liver    Goiter    Mildly enlarged thyroid TSH normal    Hepatitis C    Hyperglycemia    Hyperlipidemia    Hypertension    Obesity    Obesity, unspecified 04/26/2013   Pneumonia 05/02/2013   Preventative health care 12/18/2012   Has seen Dr Carlota Raspberry for GYN in past   Thyroid disease    No per pt 05/25/15   Type II or unspecified type diabetes mellitus without mention of complication, not stated as uncontrolled    Vitamin D deficiency     Patient Active Problem List   Diagnosis Date Noted   Cough 10/17/2017   Rash 01/18/2017   Essential hypertension, benign 11/02/2013   Acute bronchitis 10/31/2013   Atypical chest pain 10/31/2013   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      Family History  Problem Relation Age of Onset   Heart attack Maternal Grandfather 33   Heart disease Maternal Grandfather    Diabetes Maternal Grandmother        type 2   Blindness Maternal Grandmother    Hypertension Paternal Grandmother    Stroke Paternal Grandfather    Hypertension Sister    Hypertension Sister     Social History   Tobacco Use   Smoking status: Never   Smokeless tobacco: Never  Vaping Use   Vaping Use: Never used  Substance Use Topics   Alcohol use: No   Drug use: No    Home Medications Prior to Admission medications   Medication Sig Start Date End Date Taking? Authorizing Provider  guaiFENesin (ROBITUSSIN) 100 MG/5ML liquid Take 5-10 mLs (100-200 mg total) by mouth every 4 (four) hours as needed for cough or to loosen phlegm. 01/22/21  Yes Varney Biles, MD  oseltamivir (TAMIFLU) 75 MG capsule Take 1 capsule (75 mg total) by mouth every 12 (twelve) hours. 01/22/21  Yes Varney Biles, MD  albuterol (VENTOLIN HFA) 108 (90 Base) MCG/ACT inhaler Inhale 1-2 puffs into the lungs every 6 (six) hours as needed for wheezing or shortness of breath. 01/22/21   Varney Biles, MD  hydrochlorothiazide (HYDRODIURIL) 25 MG tablet  Take 1 tablet (25 mg total) by mouth daily. 11/03/20   Molpus, John, MD  losartan (COZAAR) 50 MG tablet Take 1 tablet (50 mg total) by mouth daily. 11/03/20   Molpus, John, MD  metoprolol succinate (TOPROL-XL) 100 MG 24 hr tablet Take 1 tablet (100 mg total) by mouth daily. 11/03/20   Molpus, Jenny Reichmann, MD    Allergies    Patient has no known allergies.  Review of Systems   Review of Systems  Constitutional:  Positive for activity change.  HENT:  Positive for congestion.   Respiratory:  Positive for cough and wheezing.   Gastrointestinal:  Negative for nausea and vomiting.  All other systems reviewed and are negative.  Physical Exam Updated Vital Signs BP (!) 181/99 (BP Location: Left Arm)   Pulse (!) 104   Temp 99.3 F (37.4 C)  (Oral)   Resp 18   Ht 5\' 7"  (1.702 m)   Wt (!) 148.3 kg   SpO2 96%   BMI 51.22 kg/m   Physical Exam Vitals and nursing note reviewed.  Constitutional:      Appearance: She is well-developed.  HENT:     Head: Atraumatic.  Cardiovascular:     Rate and Rhythm: Normal rate.  Pulmonary:     Effort: Pulmonary effort is normal.     Breath sounds: No wheezing.  Musculoskeletal:     Cervical back: Normal range of motion and neck supple.  Lymphadenopathy:     Cervical: Cervical adenopathy present.  Skin:    General: Skin is warm and dry.  Neurological:     Mental Status: She is alert and oriented to person, place, and time.    ED Results / Procedures / Treatments   Labs (all labs ordered are listed, but only abnormal results are displayed) Labs Reviewed  RESP PANEL BY RT-PCR (FLU A&B, COVID) ARPGX2 - Abnormal; Notable for the following components:      Result Value   Influenza A by PCR POSITIVE (*)    All other components within normal limits    EKG None  Radiology No results found.  Procedures Procedures   Medications Ordered in ED Medications - No data to display  ED Course  I have reviewed the triage vital signs and the nursing notes.  Pertinent labs & imaging results that were available during my care of the patient were reviewed by me and considered in my medical decision making (see chart for details).  Clinical Course as of 01/22/21 1537  Tue Jan 22, 2021  1537 Patient's fluid is positive.  She requested Tamiflu which has been prescribed. [AN]    Clinical Course User Index [AN] Varney Biles, MD   MDM Rules/Calculators/A&P                          \\ 55 year old comes in with chief complaint of cough, congestion.  Symptoms consistent with a viral syndrome.  She is positive for flu. No respiratory distress, hypoxia.  Stable for discharge at this time.   Final Clinical Impression(s) / ED Diagnoses Final diagnoses:  Influenza A    Rx / DC  Orders ED Discharge Orders          Ordered    albuterol (VENTOLIN HFA) 108 (90 Base) MCG/ACT inhaler  Every 6 hours PRN        01/22/21 1433    guaiFENesin (ROBITUSSIN) 100 MG/5ML liquid  Every 4 hours PRN  01/22/21 1433    oseltamivir (TAMIFLU) 75 MG capsule  Every 12 hours        01/22/21 1525             Varney Biles, MD 01/22/21 1437    Varney Biles, MD 01/22/21 1537

## 2021-04-09 ENCOUNTER — Ambulatory Visit (INDEPENDENT_AMBULATORY_CARE_PROVIDER_SITE_OTHER): Payer: BC Managed Care – PPO | Admitting: Family Medicine

## 2021-04-09 ENCOUNTER — Encounter: Payer: Self-pay | Admitting: Family Medicine

## 2021-04-09 VITALS — BP 132/76 | HR 99 | Temp 98.2°F | Resp 16 | Ht 67.0 in | Wt 335.2 lb

## 2021-04-09 DIAGNOSIS — E559 Vitamin D deficiency, unspecified: Secondary | ICD-10-CM

## 2021-04-09 DIAGNOSIS — R252 Cramp and spasm: Secondary | ICD-10-CM

## 2021-04-09 DIAGNOSIS — L989 Disorder of the skin and subcutaneous tissue, unspecified: Secondary | ICD-10-CM | POA: Diagnosis not present

## 2021-04-09 DIAGNOSIS — R739 Hyperglycemia, unspecified: Secondary | ICD-10-CM | POA: Diagnosis not present

## 2021-04-09 DIAGNOSIS — Z23 Encounter for immunization: Secondary | ICD-10-CM

## 2021-04-09 DIAGNOSIS — I10 Essential (primary) hypertension: Secondary | ICD-10-CM

## 2021-04-09 DIAGNOSIS — E669 Obesity, unspecified: Secondary | ICD-10-CM

## 2021-04-09 DIAGNOSIS — E785 Hyperlipidemia, unspecified: Secondary | ICD-10-CM

## 2021-04-09 DIAGNOSIS — Z8619 Personal history of other infectious and parasitic diseases: Secondary | ICD-10-CM

## 2021-04-09 DIAGNOSIS — K760 Fatty (change of) liver, not elsewhere classified: Secondary | ICD-10-CM

## 2021-04-09 DIAGNOSIS — Z1231 Encounter for screening mammogram for malignant neoplasm of breast: Secondary | ICD-10-CM

## 2021-04-09 NOTE — Patient Instructions (Signed)

## 2021-04-10 LAB — CBC
HCT: 43 % (ref 36.0–46.0)
Hemoglobin: 13.3 g/dL (ref 12.0–15.0)
MCHC: 30.9 g/dL (ref 30.0–36.0)
MCV: 73 fl — ABNORMAL LOW (ref 78.0–100.0)
Platelets: 228 10*3/uL (ref 150.0–400.0)
RBC: 5.89 Mil/uL — ABNORMAL HIGH (ref 3.87–5.11)
RDW: 16.4 % — ABNORMAL HIGH (ref 11.5–15.5)
WBC: 7.1 10*3/uL (ref 4.0–10.5)

## 2021-04-10 LAB — COMPREHENSIVE METABOLIC PANEL
ALT: 7 U/L (ref 0–35)
AST: 16 U/L (ref 0–37)
Albumin: 3.9 g/dL (ref 3.5–5.2)
Alkaline Phosphatase: 94 U/L (ref 39–117)
BUN: 13 mg/dL (ref 6–23)
CO2: 27 mEq/L (ref 19–32)
Calcium: 9.7 mg/dL (ref 8.4–10.5)
Chloride: 101 mEq/L (ref 96–112)
Creatinine, Ser: 0.98 mg/dL (ref 0.40–1.20)
GFR: 65.18 mL/min (ref >60.00)
Glucose, Bld: 78 mg/dL (ref 70–99)
Potassium: 4 mEq/L (ref 3.5–5.1)
Sodium: 141 mEq/L (ref 135–145)
Total Bilirubin: 0.9 mg/dL (ref 0.2–1.2)
Total Protein: 7.8 g/dL (ref 6.0–8.3)

## 2021-04-10 LAB — LIPID PANEL
Cholesterol: 220 mg/dL — ABNORMAL HIGH (ref 0–200)
HDL: 49.4 mg/dL (ref >39.00)
LDL Cholesterol: 139 mg/dL — ABNORMAL HIGH (ref 0–99)
NonHDL: 170.87
Total CHOL/HDL Ratio: 4
Triglycerides: 158 mg/dL — ABNORMAL HIGH (ref 0.0–149.0)
VLDL: 31.6 mg/dL (ref 0.0–40.0)

## 2021-04-10 LAB — VITAMIN D 25 HYDROXY (VIT D DEFICIENCY, FRACTURES): VITD: 13.36 ng/mL — ABNORMAL LOW (ref 30.00–100.00)

## 2021-04-10 LAB — HEMOGLOBIN A1C: Hgb A1c MFr Bld: 7.1 % — ABNORMAL HIGH (ref 4.6–6.5)

## 2021-04-10 LAB — TSH: TSH: 3.03 u[IU]/mL (ref 0.35–5.50)

## 2021-04-10 LAB — MAGNESIUM: Magnesium: 1.9 mg/dL (ref 1.5–2.5)

## 2021-04-11 ENCOUNTER — Other Ambulatory Visit: Payer: Self-pay

## 2021-04-11 DIAGNOSIS — E875 Hyperkalemia: Secondary | ICD-10-CM

## 2021-04-11 MED ORDER — ATORVASTATIN CALCIUM 10 MG PO TABS: 10.0000 mg | ORAL_TABLET | Freq: Every day | ORAL | 3 refills | Status: DC

## 2021-04-11 NOTE — Addendum Note (Signed)
Addended by: Randolm Idol A on: 04/11/2021 11:38 AM   Modules accepted: Orders

## 2021-04-15 ENCOUNTER — Other Ambulatory Visit: Payer: Self-pay | Admitting: Family Medicine

## 2021-04-15 ENCOUNTER — Encounter (HOSPITAL_BASED_OUTPATIENT_CLINIC_OR_DEPARTMENT_OTHER): Payer: Self-pay

## 2021-04-15 ENCOUNTER — Ambulatory Visit (HOSPITAL_BASED_OUTPATIENT_CLINIC_OR_DEPARTMENT_OTHER)
Admission: RE | Admit: 2021-04-15 | Discharge: 2021-04-15 | Disposition: A | Discharge status: Home / Self Care | Payer: BC Managed Care – PPO | Source: Ambulatory Visit | Attending: Family Medicine | Admitting: Family Medicine

## 2021-04-15 ENCOUNTER — Other Ambulatory Visit: Payer: Self-pay

## 2021-04-15 DIAGNOSIS — Z1231 Encounter for screening mammogram for malignant neoplasm of breast: Secondary | ICD-10-CM | POA: Diagnosis not present

## 2021-04-15 DIAGNOSIS — Z8619 Personal history of other infectious and parasitic diseases: Secondary | ICD-10-CM | POA: Insufficient documentation

## 2021-04-15 DIAGNOSIS — K760 Fatty (change of) liver, not elsewhere classified: Secondary | ICD-10-CM

## 2021-04-15 NOTE — Progress Notes (Signed)
Subjective:    Patient ID: Jennifer Rich, female    DOB: 04-29-1965, 56 y.o.   MRN: [id number]  Chief Complaint  Patient presents with   Establish Care    HPI Patient is in today for new patient appointment with a past medical history of hypertension, hyperlipidemia, hyperglycemia, fatty liver and treated Hepatitis C. She feels well today. She was a patient of the practice years ago but she moved to [place] and has just moved back. No acute concerns. No recent febrile illness or hospitalizations. Denies CP/palp/SOB/HA/congestion/fevers/GI or GU c/o. 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   BLASTOCYSTIS HOMINIS     CAP (community acquired pneumonia) 04/17/2015   Cervical cancer screening 01/25/2013   Chicken pox as a child   Fatty liver    Goiter    Mildly enlarged thyroid TSH normal    Hepatitis C    Hyperglycemia    Hyperlipidemia    Hypertension    Obesity    Obesity, unspecified 04/26/2013   Pneumonia 05/02/2013   Preventative health care 12/18/2012   Has seen Dr Carlota Raspberry for GYN in past   Thyroid disease    No per pt 05/25/15   Type II or unspecified type diabetes mellitus without mention of complication, not stated as uncontrolled    Vitamin D deficiency     Past Surgical History:  Procedure Laterality Date   None     WISDOM TOOTH EXTRACTION  56 yrs old    Family History  Problem Relation Age of Onset   Heart attack Maternal Grandfather 75   Heart disease Maternal Grandfather    Diabetes Maternal Grandmother        type 2   Blindness Maternal Grandmother    Hypertension Paternal Grandmother    Stroke Paternal Grandfather    Hypertension Sister    Hypertension Sister     Social History   Socioeconomic History   Marital status: Married    Spouse name: Not on file   Number of children: 0   Years of education: Not on file    Highest education level: Not on file  Occupational History   Occupation: Research officer, political party: [employer]  Tobacco Use   Smoking status: Never   Smokeless tobacco: Never  Vaping Use   Vaping Use: Never used  Substance and Sexual Activity   Alcohol use: No   Drug use: No   Sexual activity: Yes    Partners: Male    Comment: lives with husband and [place], [name], no dietary restrictions.  Other Topics Concern   Not on file  Social History Narrative   No Pork         Social Determinants of Health   Financial Resource Strain: Not on file  Food Insecurity: Not on file  Transportation Needs: Not on file  Physical Activity: Not on file  Stress: Not on file  Social Connections: Not on file  Intimate Partner Violence: Not on file    Outpatient Medications Prior to Visit  Medication Sig Dispense Refill   albuterol (VENTOLIN HFA) 108 (90 Base) MCG/ACT inhaler Inhale 1-2 puffs into the lungs every 6 (six) hours as needed for wheezing or shortness of breath. 1 each 0   guaiFENesin (ROBITUSSIN) 100 MG/5ML liquid Take 5-10 mLs (100-200 mg total) by mouth every 4 (four) hours as needed for cough or  to loosen phlegm. 60 mL 0   hydrochlorothiazide (HYDRODIURIL) 25 MG tablet Take 1 tablet (25 mg total) by mouth daily. 30 tablet 0   losartan (COZAAR) 50 MG tablet Take 1 tablet (50 mg total) by mouth daily. 30 tablet 0   metoprolol succinate (TOPROL-XL) 100 MG 24 hr tablet Take 1 tablet (100 mg total) by mouth daily. 30 tablet 0   oseltamivir (TAMIFLU) 75 MG capsule Take 1 capsule (75 mg total) by mouth every 12 (twelve) hours. 10 capsule 0   No facility-administered medications prior to visit.    No Known Allergies  Review of Systems  Constitutional:  Positive for malaise/fatigue. Negative for chills and fever.  HENT:  Negative for congestion and hearing loss.   Eyes:  Negative for discharge.  Respiratory:  Negative for cough, sputum production and shortness of breath.    Cardiovascular:  Negative for chest pain, palpitations and leg swelling.  Gastrointestinal:  Negative for abdominal pain, blood in stool, constipation, diarrhea, heartburn, nausea and vomiting.  Genitourinary:  Negative for dysuria, frequency, hematuria and urgency.  Musculoskeletal:  Negative for back pain, falls and myalgias.  Skin:  Negative for rash.  Neurological:  Negative for dizziness, sensory change, loss of consciousness, weakness and headaches.  Endo/Heme/Allergies:  Negative for environmental allergies. Does not bruise/bleed easily.  Psychiatric/Behavioral:  Negative for depression and suicidal ideas. The patient is not nervous/anxious and does not have insomnia.       Objective:    Physical Exam Constitutional:      General: She is not in acute distress.    Appearance: She is not diaphoretic.  HENT:     Head: Normocephalic and atraumatic.     Right Ear: External ear normal.     Left Ear: External ear normal.     Nose: Nose normal.     Mouth/Throat:     Pharynx: No oropharyngeal exudate.  Eyes:     General: No scleral icterus.       Right eye: No discharge.        Left eye: No discharge.     Conjunctiva/sclera: Conjunctivae normal.     Pupils: Pupils are equal, round, and reactive to light.  Neck:     Thyroid: No thyromegaly.  Cardiovascular:     Rate and Rhythm: Normal rate and regular rhythm.     Heart sounds: Normal heart sounds. No murmur heard. Pulmonary:     Effort: Pulmonary effort is normal. No respiratory distress.     Breath sounds: Normal breath sounds. No wheezing or rales.  Abdominal:     General: Bowel sounds are normal. There is no distension.     Palpations: Abdomen is soft. There is no mass.     Tenderness: There is no abdominal tenderness.  Musculoskeletal:        General: No tenderness. Normal range of motion.     Cervical back: Normal range of motion and neck supple.  Lymphadenopathy:     Cervical: No cervical adenopathy.  Skin:     General: Skin is warm and dry.     Findings: No rash.  Neurological:     Mental Status: She is alert and oriented to person, place, and time.     Cranial Nerves: No cranial nerve deficit.     Coordination: Coordination normal.     Deep Tendon Reflexes: Reflexes are normal and symmetric. Reflexes normal.    BP 132/76    Pulse 99    Temp 98.2 F (36.8 C)  Resp 16    Ht 5\' 7"  (1.702 m)    Wt (!) 335 lb 3.2 oz (152 kg)    SpO2 95%    BMI 52.50 kg/m  Wt Readings from Last 3 Encounters:  04/09/21 (!) 335 lb 3.2 oz (152 kg)  01/22/21 (!) 327 lb (148.3 kg)  12/11/20 (!) 332 lb (150.6 kg)    Diabetic Foot Exam - Simple   No data filed    Lab Results  Component Value Date   WBC 7.1 04/09/2021   HGB 13.3 04/09/2021   HCT 43.0 04/09/2021   PLT 228.0 04/09/2021   GLUCOSE 78 04/09/2021   CHOL 220 (H) 04/09/2021   TRIG 158.0 (H) 04/09/2021   HDL 49.40 04/09/2021   LDLCALC 139 (H) 04/09/2021   ALT 7 04/09/2021   AST 16 04/09/2021   NA 141 04/09/2021   K 4.0 04/09/2021   CL 101 04/09/2021   CREATININE 0.98 04/09/2021   BUN 13 04/09/2021   CO2 27 04/09/2021   TSH 3.03 04/09/2021   HGBA1C 7.1 (H) 04/09/2021    Lab Results  Component Value Date   TSH 3.03 04/09/2021   Lab Results  Component Value Date   WBC 7.1 04/09/2021   HGB 13.3 04/09/2021   HCT 43.0 04/09/2021   MCV 73.0 (L) 04/09/2021   PLT 228.0 04/09/2021   Lab Results  Component Value Date   NA 141 04/09/2021   K 4.0 04/09/2021   CO2 27 04/09/2021   GLUCOSE 78 04/09/2021   BUN 13 04/09/2021   CREATININE 0.98 04/09/2021   BILITOT 0.9 04/09/2021   ALKPHOS 94 04/09/2021   AST 16 04/09/2021   ALT 7 04/09/2021   PROT 7.8 04/09/2021   ALBUMIN 3.9 04/09/2021   CALCIUM 9.7 04/09/2021   ANIONGAP 8 11/03/2020   GFR 65.18 04/09/2021   Lab Results  Component Value Date   CHOL 220 (H) 04/09/2021   Lab Results  Component Value Date   HDL 49.40 04/09/2021   Lab Results  Component Value Date   LDLCALC 139  (H) 04/09/2021   Lab Results  Component Value Date   TRIG 158.0 (H) 04/09/2021   Lab Results  Component Value Date   CHOLHDL 4 04/09/2021   Lab Results  Component Value Date   HGBA1C 7.1 (H) 04/09/2021       Assessment & Plan:   Problem List Items Addressed This Visit     Hyperlipidemia    Encourage heart healthy diet such as MIND or DASH diet, increase exercise, avoid trans fats, simple carbohydrates and processed foods, consider a krill or fish or flaxseed oil cap daily.       Relevant Orders   Lipid panel (Completed)   Hyperglycemia    hgba1c acceptable, minimize simple carbs. Increase exercise as tolerated.       Relevant Orders   Hemoglobin A1c (Completed)   Vitamin D deficiency    Supplement and monitor      Relevant Orders   VITAMIN D 25 Hydroxy (Vit-D Deficiency, Fractures) (Completed)   Obesity    Maintain heart healthy diet, decrease po intake and increase exercise as tolerated. Needs 7-8 hours of sleep nightly. Avoid trans fats, eat small, frequent meals every 4-5 hours with lean proteins, complex carbs and healthy fats. Minimize simple carbs, high fat foods and processed foods      Essential hypertension, benign    Well controlled, no changes to meds. Encouraged heart healthy diet such as the DASH diet and exercise  as tolerated.       Relevant Orders   CBC (Completed)   Comprehensive metabolic panel (Completed)   TSH (Completed)   Fatty liver    Encouraged to increase exercise. Attempt modest weight loss and minimize processed foods and simple carbodrates.      Hx of hepatitis C    Was treated while living in [place] but now needs follow up here now that she has returned to live in [place] again.       Other Visit Diagnoses     Skin lesions    -  Primary   Relevant Orders   Ambulatory referral to Dermatology   Muscle cramps       Relevant Orders   Magnesium (Completed)   Encounter for screening mammogram for malignant neoplasm of  breast       Relevant Orders   MM 3D SCREEN BREAST BILATERAL   Need for shingles vaccine       Relevant Orders   Varicella-zoster vaccine IM (Completed)       I am having Jennifer Rich maintain her hydrochlorothiazide, losartan, metoprolol succinate, albuterol, guaiFENesin, and oseltamivir.  No orders of the defined types were placed in this encounter.    Penni Homans, MD

## 2021-04-15 NOTE — Assessment & Plan Note (Signed)
Supplement and monitor 

## 2021-04-15 NOTE — Assessment & Plan Note (Signed)
Encourage heart healthy diet such as MIND or DASH diet, increase exercise, avoid trans fats, simple carbohydrates and processed foods, consider a krill or fish or flaxseed oil cap daily.  °

## 2021-04-15 NOTE — Assessment & Plan Note (Signed)
Encouraged to increase exercise. Attempt modest weight loss and minimize processed foods and simple carbodrates.

## 2021-04-15 NOTE — Assessment & Plan Note (Addendum)
Maintain heart healthy diet, decrease po intake and increase exercise as tolerated. Needs 7-8 hours of sleep nightly. Avoid trans fats, eat small, frequent meals every 4-5 hours with lean proteins, complex carbs and healthy fats. Minimize simple carbs, high fat foods and processed foods

## 2021-04-15 NOTE — Assessment & Plan Note (Signed)
>>  ASSESSMENT AND PLAN FOR HYPERGLYCEMIA WRITTEN ON 04/15/2021 12:32 PM BY BLYTH, STACEY A, MD  hgba1c acceptable, minimize simple carbs. Increase exercise as tolerated.

## 2021-04-15 NOTE — Assessment & Plan Note (Signed)
hgba1c acceptable, minimize simple carbs. Increase exercise as tolerated.  

## 2021-04-15 NOTE — Assessment & Plan Note (Signed)
Was treated while living in [place] but now needs follow up here now that she has returned to live in [place] again.

## 2021-04-15 NOTE — Assessment & Plan Note (Signed)
Well controlled, no changes to meds. Encouraged heart healthy diet such as the DASH diet and exercise as tolerated.  °

## 2021-04-16 ENCOUNTER — Other Ambulatory Visit: Payer: Self-pay | Admitting: Family Medicine

## 2021-04-16 DIAGNOSIS — R928 Other abnormal and inconclusive findings on diagnostic imaging of breast: Secondary | ICD-10-CM

## 2021-04-27 ENCOUNTER — Ambulatory Visit
Admission: RE | Admit: 2021-04-27 | Discharge: 2021-04-27 | Disposition: A | Discharge status: Home / Self Care | Payer: BC Managed Care – PPO | Source: Ambulatory Visit | Attending: Family Medicine | Admitting: Family Medicine

## 2021-04-27 ENCOUNTER — Other Ambulatory Visit: Payer: Self-pay | Admitting: Family Medicine

## 2021-04-27 DIAGNOSIS — R928 Other abnormal and inconclusive findings on diagnostic imaging of breast: Secondary | ICD-10-CM

## 2021-04-27 DIAGNOSIS — R590 Localized enlarged lymph nodes: Secondary | ICD-10-CM | POA: Diagnosis not present

## 2021-05-12 ENCOUNTER — Encounter: Payer: Self-pay | Admitting: Family Medicine

## 2021-05-13 ENCOUNTER — Encounter: Payer: Self-pay | Admitting: Physician Assistant

## 2021-05-13 ENCOUNTER — Other Ambulatory Visit: Payer: Self-pay

## 2021-05-13 DIAGNOSIS — E669 Obesity, unspecified: Secondary | ICD-10-CM

## 2021-05-14 ENCOUNTER — Ambulatory Visit (INDEPENDENT_AMBULATORY_CARE_PROVIDER_SITE_OTHER): Payer: BC Managed Care – PPO | Admitting: Family

## 2021-05-14 ENCOUNTER — Telehealth: Payer: Self-pay | Admitting: Family Medicine

## 2021-05-14 VITALS — BP 188/98 | HR 94 | Temp 98.0°F | Resp 16 | Wt 333.6 lb

## 2021-05-14 DIAGNOSIS — K1120 Sialoadenitis, unspecified: Secondary | ICD-10-CM | POA: Diagnosis not present

## 2021-05-14 DIAGNOSIS — I10 Essential (primary) hypertension: Secondary | ICD-10-CM

## 2021-05-14 DIAGNOSIS — H9311 Tinnitus, right ear: Secondary | ICD-10-CM | POA: Diagnosis not present

## 2021-05-14 MED ORDER — AMOXICILLIN-POT CLAVULANATE 875-125 MG PO TABS: 1.0000 | ORAL_TABLET | Freq: Two times a day (BID) | ORAL | 0 refills | Status: DC

## 2021-05-14 MED ORDER — FLUCONAZOLE 150 MG PO TABS: ORAL_TABLET | ORAL | 0 refills | Status: DC

## 2021-05-14 NOTE — Progress Notes (Signed)
? ?Subjective:  ? ?By signing my name below, I, [name], attest that this documentation has been prepared under the direction and in the presence of [name] NP, 05/14/2021   ? ? Patient ID: Jennifer Rich, female    DOB: June 05, 1965, 56 y.o.   MRN: [id number] ? ?Chief Complaint  ?Patient presents with  ? Tinnitus  ?  Patient complains of ringing on both ears, more in the right  ? Facial Swelling  ?  Complains of swelling on right side of face  ? ? ?HPI ?Patient is in today for an office visit.  ? ?Face Swelling - Patient complains of swelling on the right side of the face that appeared on 05/09/2021. Denies tongue/lip swelling or SOB.  ? ?Tinnitus - Patient complains of ringing in the right ear. Mild, not bothering her that much. ? ?Blood Pressure - She has discontinued using 100 MG of Metorporlol Succinate. As of today's visit her blood pressure is 188/98. ?BP Readings from Last 3 Encounters:  ?05/14/21 (!) 188/98  ?04/09/21 132/76  ?01/22/21 (!) 181/99  ? ? ?Health Maintenance Due  ?Topic Date Due  ? FOOT EXAM  Never done  ? OPHTHALMOLOGY EXAM  Never done  ? COLONOSCOPY (Pts 45-50yr Insurance coverage will need to be confirmed)  Never done  ? PAP SMEAR-Modifier  01/26/2016  ? ? ?Past Medical History:  ?Diagnosis Date  ? Allergy   ? Anxiety and depression 01/30/2013  ? 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 BLASTOCYSTIS HOMINIS    ? CAP (community acquired pneumonia) 04/17/2015  ? Cervical cancer screening 01/25/2013  ? Chicken pox as a child  ? Fatty liver   ? Goiter   ? Mildly enlarged thyroid TSH normal   ? Hepatitis C   ? Hyperglycemia   ? Hyperlipidemia   ? Hypertension   ? Obesity   ? Obesity, unspecified 04/26/2013  ? Pneumonia 05/02/2013  ? Preventative health care 12/18/2012  ? Has seen Dr GCarlota Raspberryfor GYN in past  ? Thyroid disease   ? No per pt 05/25/15  ? Type II or unspecified type diabetes mellitus without mention of complication, not stated as uncontrolled   ? Vitamin D  deficiency   ? ? ?Past Surgical History:  ?Procedure Laterality Date  ? None    ? WISDOM TOOTH EXTRACTION  56yrs old  ? ? ?Family History  ?Problem Relation Age of Onset  ? Heart attack Maternal Grandfather 75  ? Heart disease Maternal Grandfather   ? Diabetes Maternal Grandmother   ?     type 2  ? Blindness Maternal Grandmother   ? Hypertension Paternal Grandmother   ? Stroke Paternal Grandfather   ? Hypertension Sister   ? Hypertension Sister   ? ? ?Social History  ? ?Socioeconomic History  ? Marital status: Married  ?  Spouse name: Not on file  ? Number of children: 0  ? Years of education: Not on file  ? Highest education level: Not on file  ?Occupational History  ? Occupation: SPatent examiner ?  Employer: [employer]  ?Tobacco Use  ? Smoking status: Never  ? Smokeless tobacco: Never  ?Vaping Use  ? Vaping Use: Never used  ?Substance and Sexual Activity  ? Alcohol use: No  ? Drug use: No  ? Sexual activity: Yes  ?  Partners: Male  ?  Comment: [name], no dietary restrictions.  ?Other Topics Concern  ? Not on file  ?Social  History Narrative  ? No Pork  ?   ?   ? ?Social Determinants of Health  ? ?Financial Resource Strain: Not on file  ?Food Insecurity: Not on file  ?Transportation Needs: Not on file  ?Physical Activity: Not on file  ?Stress: Not on file  ?Social Connections: Not on file  ?Intimate Partner Violence: Not on file  ? ? ?Outpatient Medications Prior to Visit  ?Medication Sig Dispense Refill  ? albuterol (VENTOLIN HFA) 108 (90 Base) MCG/ACT inhaler Inhale 1-2 puffs into the lungs every 6 (six) hours as needed for wheezing or shortness of breath. 1 each 0  ? atorvastatin (LIPITOR) 10 MG tablet Take 1 tablet (10 mg total) by mouth daily. 90 tablet 3  ? guaiFENesin (ROBITUSSIN) 100 MG/5ML liquid Take 5-10 mLs (100-200 mg total) by mouth every 4 (four) hours as needed for cough or to loosen phlegm. 60 mL 0  ? hydrochlorothiazide (HYDRODIURIL) 25 MG tablet Take 1 tablet  (25 mg total) by mouth daily. 30 tablet 0  ? losartan (COZAAR) 50 MG tablet Take 1 tablet (50 mg total) by mouth daily. 30 tablet 0  ? metoprolol succinate (TOPROL-XL) 100 MG 24 hr tablet Take 1 tablet (100 mg total) by mouth daily. 30 tablet 0  ? oseltamivir (TAMIFLU) 75 MG capsule Take 1 capsule (75 mg total) by mouth every 12 (twelve) hours. 10 capsule 0  ? ?No facility-administered medications prior to visit.  ? ? ?No Known Allergies ? ?Review of Systems  ?HENT:  Positive for tinnitus (Right Ear).   ? ?   ?Objective:  ?  ?Physical Exam ?Constitutional:   ?   General: She is not in acute distress. ?   Appearance: Normal appearance. She is not ill-appearing.  ?HENT:  ?   Head: Normocephalic and atraumatic.  ?   Jaw: Swelling (Parotid gland underneath right jawline) present. No tenderness.  ?   Right Ear: Tympanic membrane, ear canal and external ear normal.  ?   Left Ear: Tympanic membrane, ear canal and external ear normal.  ?   Ears:  ?   Comments: (+) Exostosis Left Ear Canal  ?Eyes:  ?   Extraocular Movements: Extraocular movements intact.  ?   Pupils: Pupils are equal, round, and reactive to light.  ?Cardiovascular:  ?   Rate and Rhythm: Normal rate and regular rhythm.  ?   Heart sounds: Normal heart sounds. No murmur heard. ?  No gallop.  ?Pulmonary:  ?   Effort: Pulmonary effort is normal. No respiratory distress.  ?   Breath sounds: Normal breath sounds. No wheezing or rales.  ?Skin: ?   General: Skin is warm and dry.  ?Neurological:  ?   Mental Status: She is alert and oriented to person, place, and time.  ?Psychiatric:     ?   Mood and Affect: Mood normal.     ?   Behavior: Behavior normal.     ?   Judgment: Judgment normal.  ? ? ?BP (!) 188/98   Pulse 94   Temp 98 ?F (36.7 ?C) (Oral)   Resp 16   Wt (!) 333 lb 9.6 oz (151.3 kg)   SpO2 100%   BMI 52.25 kg/m?  ?Wt Readings from Last 3 Encounters:  ?05/14/21 (!) 333 lb 9.6 oz (151.3 kg)  ?04/09/21 (!) 335 lb 3.2 oz (152 kg)  ?01/22/21 (!) 327 lb  (148.3 kg)  ? ? ?   ?Assessment & Plan:  ? ?Problem List Items Addressed This  Visit   ? ?  ? Unprioritized  ? Tinnitus of right ear  ?  New. Symptoms are mild. Denies any hearing concerns.  Discussed use of white noise as needed.  ?  ?  ? Parotitis - Primary  ?  New. Will rx with amoxicillin.  ?  ?  ? Essential hypertension, benign  ?  Uncontrolled.  Pt was given clonidine 0.'1mg'$  PO x 1 here in the clinic. Follow up bp was 188/98.  She was advised to go straight home and take her HCTZ and losartan. States she never started metoprolol. We can repeat her BP in a few days. If still elevated, plan to add back in metoprolol.  ?  ?  ? ? ? ? ?Meds ordered this encounter  ?Medications  ? amoxicillin-clavulanate (AUGMENTIN) 875-125 MG tablet  ?  Sig: Take 1 tablet by mouth 2 (two) times daily.  ?  Dispense:  20 tablet  ?  Refill:  0  ?  Order Specific Question:   Supervising Provider  ?  Answer:   Penni Homans A [[id number]]  ? fluconazole (DIFLUCAN) 150 MG tablet  ?  Sig: One tab by mouth once as needed for for yeast infection.  ?  Dispense:  1 tablet  ?  Refill:  0  ?  Order Specific Question:   Supervising Provider  ?  Answer:   Penni Homans A [[id number]]  ? ? ?I, Nance Pear, NP, personally preformed the services described in this documentation.  All medical record entries made by the scribe were at my direction and in my presence.  I have reviewed the chart and discharge instructions (if applicable) and agree that the record reflects my personal performance and is accurate and complete. 05/14/2021 ? ? ?I,Amber Collins,acting as a Education administrator for [employer], NP.,have documented all relevant documentation on the behalf of Nance Pear, NP,as directed by  Nance Pear, NP while in the presence of Nance Pear, NP. ? ? ? ?Nance Pear, NP ? ?

## 2021-05-14 NOTE — Patient Instructions (Addendum)
Please begin augmentin (antibiotic) twice daily. ?Call if increased pain/swelling/redness.  ?Go to ER if you develop tongue/lip swelling or SOB.  ?

## 2021-05-14 NOTE — Telephone Encounter (Signed)
Pt stated the right side of her face and neck is swollen, with ear ringing. Sheduled her with Melissa today. Also transferred to triage given it was only on one side.  ?

## 2021-05-15 ENCOUNTER — Telehealth: Payer: Self-pay

## 2021-05-15 DIAGNOSIS — H9311 Tinnitus, right ear: Secondary | ICD-10-CM | POA: Insufficient documentation

## 2021-05-15 DIAGNOSIS — K1120 Sialoadenitis, unspecified: Secondary | ICD-10-CM | POA: Insufficient documentation

## 2021-05-15 NOTE — Assessment & Plan Note (Signed)
Uncontrolled.  Pt was given clonidine 0.'1mg'$  PO x 1 here in the clinic. Follow up bp was 188/98.  She was advised to go straight home and take her HCTZ and losartan. States she never started metoprolol. We can repeat her BP in a few days. If still elevated, plan to add back in metoprolol.  ?

## 2021-05-15 NOTE — Telephone Encounter (Signed)
Nurse Assessment ?Nurse: [place], RN, [place] Date/Time (Eastern Time): 05/14/2021 4:33:07 PM ?Confirm and document reason for call. If ?symptomatic, describe symptoms. ?---Caller states they made an appt for 6pm today ?because one side of face going down to neck is swollen ?with ringing in your ear. Caller denies any other s/s at ?this time. ?Does the patient have any new or worsening ?symptoms? ---Yes ?Will a triage be completed? ---Yes ?Related visit to physician within the last 2 weeks? ---No ?Does the PT have any chronic conditions? (i.e. ?diabetes, asthma, this includes High risk factors for ?pregnancy, etc.) ?---Yes ?List chronic conditions. ---autoimmune disease ?Is this a behavioral health or substance abuse call? ---No ?Guidelines ?Guideline Title Affirmed Question Affirmed Notes Nurse Date/Time (Eastern ?Time) ?Tinnitus [1] Mild tinnitus in ?both ears AND [2] ?only heard in quiet ?room ?Breeding, Therapist, sports, ?[place] ?05/14/2021 4:37:18 ?PM ?Lymph Nodes - ?Swollen ?Normal-sized lymph ?node (i.e., < 1 cm, ?<1/2 in) ?Breeding, Therapist, sports, ?[place] ?05/14/2021 4:45:17 ?PM ?PLEASE NOTE: All timestamps contained within this report are represented as [place] Standard Time. ?CONFIDENTIALTY NOTICE: This fax transmission is intended only for the addressee. It contains information that is legally privileged, confidential or ?otherwise protected from use or disclosure. If you are not the intended recipient, you are strictly prohibited from reviewing, disclosing, copying using ?or disseminating any of this information or taking any action in reliance on or regarding this information. If you have received this fax in error, please ?notify us immediately by telephone so that we can arrange for its return to [place]. Phone: [phone], Toll-Free: [phone], Fax: [phone] ?Page: 2 of 2 ?Call Id: [id number] ?Disp. Time (Eastern ?Time) Disposition Final User ?05/14/2021 [place], RN, [place] ?05/14/2021 4:51:11  [place], RN, [place] ?Caller Disagree/Comply Comply ?Caller Understands Yes ?PreDisposition Call Doctor ?Care Advice Given Per Guideline ?HOME CARE: * Tinnitus is very common. About 1 in 10 people have this problem. AVOID CAFFEINE, ASPIRIN, AND ?ALCOHOL: * These can make tinnitus worse. * You should see a doctor (or NP/PA) if this is very bothersome or an ongoing ?problem. CALL BACK IF: * Tinnitus is very bothersome; or tinnitus interferes with work, school, or other activities * Decreased ?hearing * Earache or dizziness * You become worse CARE ADVICE given per Tinnitus (Adult) guideline. ?HOME CARE: * You should be able to treat this at home. REASSURANCE AND EDUCATION - NORMAL LYMPH NODES: * ?A pea-sized lymph node (smaller than 1/2 inch or 1 cm) is usually a normal lymph node. CALL BACK IF: * Node enlarges to over ?1 inch (2.5 cm) in size * A node larger than 1/2 inch (1 cm) persists over 2 weeks * Overlying skin becomes red * You are worried ?about a lymph node * You become worse CARE ADVICE given per Lymph Nodes Swollen (Adult) guideline. ?Comments ?User: Jenny Reichmann, RN Date/Time Eilene Ghazi Time): 05/14/2021 4:34:32 PM ?Caller notes htn. ?User: Jenny Reichmann, RN Date/Time (Eastern Time): 05/14/2021 4:35:28 PM ?Caller notes swelling is on right side of jaw and neck down to collar bone. ?User: Jenny Reichmann, RN Date/Time Eilene Ghazi Time): 05/14/2021 4:38:47 PM ?Caller states she is on Losartan, htcz,and atorvastatin. ?User: Jenny Reichmann, RN Date/Time Eilene Ghazi Time): 05/14/2021 4:39:22 PM ?Caller notes she is also on Spironolactone ?

## 2021-05-15 NOTE — Assessment & Plan Note (Signed)
New.  Will rx with amoxicillin.  

## 2021-05-15 NOTE — Assessment & Plan Note (Signed)
New. Symptoms are mild. Denies any hearing concerns.  Discussed use of white noise as needed.  ?

## 2021-05-17 ENCOUNTER — Ambulatory Visit (INDEPENDENT_AMBULATORY_CARE_PROVIDER_SITE_OTHER): Payer: BC Managed Care – PPO | Admitting: Family

## 2021-05-17 DIAGNOSIS — K1120 Sialoadenitis, unspecified: Secondary | ICD-10-CM

## 2021-05-17 DIAGNOSIS — I10 Essential (primary) hypertension: Secondary | ICD-10-CM | POA: Diagnosis not present

## 2021-05-17 MED ORDER — LOSARTAN POTASSIUM-HCTZ 100-25 MG PO TABS: 1.0000 | ORAL_TABLET | Freq: Every day | ORAL | 1 refills | Status: DC

## 2021-05-17 NOTE — Assessment & Plan Note (Signed)
Parotid mass is unchanged in size. She has only been on augmentin x 2 days. Will bring her back in 1 week for re-evaluation. If no improvement will plan neck imaging.  ?

## 2021-05-17 NOTE — Patient Instructions (Signed)
Stop HCTZ and losartan and begin combo losartan/hctz pill.  ?Continue antibiotics.  ?

## 2021-05-17 NOTE — Assessment & Plan Note (Signed)
Improving but still above goal. Recommended the following: ? ?Stop HCTZ and losartan and begin combo losartan/hctz pill.  ?

## 2021-05-17 NOTE — Progress Notes (Signed)
? ?Subjective:  ? ?By signing my name below, I, [name], attest that this documentation has been prepared under the direction and in the presence of [name] NP, 05/17/2021 ? ? Patient ID: Jennifer Rich, female    DOB: [birth year], 56 y.o.   MRN: [id number] ? ?Chief Complaint  ?Patient presents with  ? Hypertension  ?  Here for follow up  ? ? ?HPI ?Patient is in today for an office visit.  ? ?Face swelling - Her face has not been swelling. She reports the swelling feeling relatively the same. ? ?Blood Pressure - Her blood pressure is improving. She is taking 50 MG of Losartan.  ?BP Readings from Last 3 Encounters:  ?05/17/21 (!) 159/78  ?05/14/21 (!) 188/98  ?04/09/21 132/76  ? ? ?Health Maintenance Due  ?Topic Date Due  ? FOOT EXAM  Never done  ? OPHTHALMOLOGY EXAM  Never done  ? COLONOSCOPY (Pts 45-17yr Insurance coverage will need to be confirmed)  Never done  ? PAP SMEAR-Modifier  01/26/2016  ? ? ?Past Medical History:  ?Diagnosis Date  ? Allergy   ? Anxiety and depression 01/30/2013  ? 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 BLASTOCYSTIS HOMINIS    ? CAP (community acquired pneumonia) 04/17/2015  ? Cervical cancer screening 01/25/2013  ? Chicken pox as a child  ? Fatty liver   ? Goiter   ? Mildly enlarged thyroid TSH normal   ? Hepatitis C   ? Hyperglycemia   ? Hyperlipidemia   ? Hypertension   ? Obesity   ? Obesity, unspecified 04/26/2013  ? Pneumonia 05/02/2013  ? Preventative health care 12/18/2012  ? Has seen Dr GCarlota Raspberryfor GYN in past  ? Thyroid disease   ? No per pt 05/25/15  ? Type II or unspecified type diabetes mellitus without mention of complication, not stated as uncontrolled   ? Vitamin D deficiency   ? ? ?Past Surgical History:  ?Procedure Laterality Date  ? None    ? WISDOM TOOTH EXTRACTION  56yrs old  ? ? ?Family History  ?Problem Relation Age of Onset  ? Heart attack Maternal Grandfather 75  ? Heart disease Maternal Grandfather   ? Diabetes Maternal Grandmother   ?      type 2  ? Blindness Maternal Grandmother   ? Hypertension Paternal Grandmother   ? Stroke Paternal Grandfather   ? Hypertension Sister   ? Hypertension Sister   ? ? ?Social History  ? ?Socioeconomic History  ? Marital status: Married  ?  Spouse name: Not on file  ? Number of children: 0  ? Years of education: Not on file  ? Highest education level: Not on file  ?Occupational History  ? Occupation: SPatent examiner ?  Employer: [employer]  ?Tobacco Use  ? Smoking status: Never  ? Smokeless tobacco: Never  ?Vaping Use  ? Vaping Use: Never used  ?Substance and Sexual Activity  ? Alcohol use: No  ? Drug use: No  ? Sexual activity: Yes  ?  Partners: Male  ?  Comment: lives with husband and [name], no dietary restrictions.  ?Other Topics Concern  ? Not on file  ?Social History Narrative  ? No Pork  ?   ?   ? ?Social Determinants of Health  ? ?Financial Resource Strain: Not on file  ?Food Insecurity: Not on file  ?Transportation Needs: Not on file  ?Physical Activity: Not on file  ?Stress: Not on file  ?Social Connections: Not  on file  ?Intimate Partner Violence: Not on file  ? ? ?Outpatient Medications Prior to Visit  ?Medication Sig Dispense Refill  ? albuterol (VENTOLIN HFA) 108 (90 Base) MCG/ACT inhaler Inhale 1-2 puffs into the lungs every 6 (six) hours as needed for wheezing or shortness of breath. 1 each 0  ? amoxicillin-clavulanate (AUGMENTIN) 875-125 MG tablet Take 1 tablet by mouth 2 (two) times daily. 20 tablet 0  ? atorvastatin (LIPITOR) 10 MG tablet Take 1 tablet (10 mg total) by mouth daily. 90 tablet 3  ? fluconazole (DIFLUCAN) 150 MG tablet One tab by mouth once as needed for for yeast infection. 1 tablet 0  ? guaiFENesin (ROBITUSSIN) 100 MG/5ML liquid Take 5-10 mLs (100-200 mg total) by mouth every 4 (four) hours as needed for cough or to loosen phlegm. 60 mL 0  ? hydrochlorothiazide (HYDRODIURIL) 25 MG tablet Take 1 tablet (25 mg total) by mouth daily. 30 tablet 0  ? losartan (COZAAR) 50 MG  tablet Take 1 tablet (50 mg total) by mouth daily. 30 tablet 0  ? ?No facility-administered medications prior to visit.  ? ? ?No Known Allergies ? ?ROS ?See HPI ?   ?Objective:  ?  ?Physical Exam ?Constitutional:   ?   General: She is not in acute distress. ?   Appearance: Normal appearance. She is not ill-appearing.  ?HENT:  ?   Head: Normocephalic and atraumatic.  ?   Comments: (+) Enlargement of parotid gland on right side ?   Right Ear: External ear normal.  ?   Left Ear: External ear normal.  ?Eyes:  ?   Extraocular Movements: Extraocular movements intact.  ?   Pupils: Pupils are equal, round, and reactive to light.  ?Cardiovascular:  ?   Rate and Rhythm: Normal rate and regular rhythm.  ?   Heart sounds: Normal heart sounds. No murmur heard. ?  No gallop.  ?Pulmonary:  ?   Effort: Pulmonary effort is normal. No respiratory distress.  ?   Breath sounds: Normal breath sounds. No wheezing or rales.  ?Skin: ?   General: Skin is warm and dry.  ?Neurological:  ?   Mental Status: She is alert and oriented to person, place, and time.  ?Psychiatric:     ?   Mood and Affect: Mood normal.     ?   Behavior: Behavior normal.     ?   Judgment: Judgment normal.  ? ? ?BP (!) 159/78 (BP Location: Right Arm, Patient Position: Sitting, Cuff Size: Large)   Pulse 88   Temp 98.2 ?F (36.8 ?C) (Oral)   Resp 16   Wt (!) 334 lb (151.5 kg)   SpO2 99%   BMI 52.31 kg/m?  ?Wt Readings from Last 3 Encounters:  ?05/17/21 (!) 334 lb (151.5 kg)  ?05/14/21 (!) 333 lb 9.6 oz (151.3 kg)  ?04/09/21 (!) 335 lb 3.2 oz (152 kg)  ? ? ?   ?Assessment & Plan:  ? ?Problem List Items Addressed This Visit   ? ?  ? Unprioritized  ? Parotitis  ?  Parotid mass is unchanged in size. She has only been on augmentin x 2 days. Will bring her back in 1 week for re-evaluation. If no improvement will plan neck imaging.  ?  ?  ? Essential hypertension, benign  ?  Improving but still above goal. Recommended the following: ? ?Stop HCTZ and losartan and begin combo  losartan/hctz pill.  ?  ?  ? Relevant Medications  ? losartan-hydrochlorothiazide (HYZAAR)  100-25 MG tablet  ? ? ? ?Meds ordered this encounter  ?Medications  ? losartan-hydrochlorothiazide (HYZAAR) 100-25 MG tablet  ?  Sig: Take 1 tablet by mouth daily.  ?  Dispense:  90 tablet  ?  Refill:  1  ?  Order Specific Question:   Supervising Provider  ?  Answer:   Penni Homans A [[id number]]  ? ? ?I, Nance Pear, NP, personally preformed the services described in this documentation.  All medical record entries made by the scribe were at my direction and in my presence.  I have reviewed the chart and discharge instructions (if applicable) and agree that the record reflects my personal performance and is accurate and complete. 05/17/2021 ? ? ?I,Amber Collins,acting as a Education administrator for [employer], NP.,have documented all relevant documentation on the behalf of Nance Pear, NP,as directed by  Nance Pear, NP while in the presence of Nance Pear, NP. ? ? ? ?Nance Pear, NP ? ?

## 2021-05-23 ENCOUNTER — Ambulatory Visit (INDEPENDENT_AMBULATORY_CARE_PROVIDER_SITE_OTHER): Payer: BC Managed Care – PPO | Admitting: Physician Assistant

## 2021-05-23 ENCOUNTER — Encounter: Payer: Self-pay | Admitting: Physician Assistant

## 2021-05-23 ENCOUNTER — Other Ambulatory Visit: Payer: BC Managed Care – PPO

## 2021-05-23 VITALS — BP 152/100 | HR 93 | Ht 67.0 in | Wt 331.0 lb

## 2021-05-23 DIAGNOSIS — Z1211 Encounter for screening for malignant neoplasm of colon: Secondary | ICD-10-CM

## 2021-05-23 DIAGNOSIS — Z1212 Encounter for screening for malignant neoplasm of rectum: Secondary | ICD-10-CM | POA: Diagnosis not present

## 2021-05-23 DIAGNOSIS — Z8619 Personal history of other infectious and parasitic diseases: Secondary | ICD-10-CM | POA: Diagnosis not present

## 2021-05-23 DIAGNOSIS — K760 Fatty (change of) liver, not elsewhere classified: Secondary | ICD-10-CM | POA: Diagnosis not present

## 2021-05-23 NOTE — Patient Instructions (Signed)
Your provider has requested that you go to the basement level for lab work before leaving today. Press "B" on the elevator. The lab is located at the first door on the left as you exit the elevator.  ? ?You have been scheduled for an abdominal ultrasound at [facility] (1st floor of hospital) on 05/30/2021 at 9:30am. Please arrive 30 minutes prior to your appointment for registration. Make certain not to have anything to eat or drink 8  hours prior to your appointment. Should you need to reschedule your appointment, please contact radiology at [phone]. This test typically takes about 30 minutes to perform.  ? ?We will try to obtain your GI records from [facility] ? ?Follow up per recommendations after ultrasound ? ?Due to recent changes in healthcare laws, you may see the results of your imaging and laboratory studies on MyChart before your provider has had a chance to review them.  We understand that in some cases there may be results that are confusing or concerning to you. Not all laboratory results come back in the same time frame and the provider may be waiting for multiple results in order to interpret others.  Please give [place] 48 hours in order for your provider to thoroughly review all the results before contacting the office for clarification of your results.   ? ?If you are age 71 or older, your body mass index should be between 23-30. Your Body mass index is 51.84 kg/m?Marland Kitchen If this is out of the aforementioned range listed, please consider follow up with your Primary Care Provider. ? ?If you are age 49 or younger, your body mass index should be between 19-25. Your Body mass index is 51.84 kg/m?Marland Kitchen If this is out of the aformentioned range listed, please consider follow up with your Primary Care Provider.  ? ?________________________________________________________ ? ?The [facility] providers would like to encourage you to use [facility] to communicate with providers for non-urgent requests or  questions.  Due to long hold times on the telephone, sending your provider a message by [facility] may be a faster and more efficient way to get a response.  Please allow 48 business hours for a response.  Please remember that this is for non-urgent requests.  ?_______________________________________________________  ? ?I appreciate the  opportunity to care for you ? ?Thank You  ? ?Lovett Calender ?

## 2021-05-23 NOTE — Progress Notes (Signed)
? ?Chief Complaint: Fatty liver ? ?HPI: ?   Jennifer Rich is a 56 year old African-American female with a past medical history as listed below, known to Dr. Ardis Hughs, who was referred to me by Mosie Lukes, MD for a complaint of fatty liver. ?   08/30/2013 patient saw Dr. Ardis Hughs for diarrhea.  At that time and recent been treated for Blastocystis and was better. ?   04/21/2019 Cologuard negative ?   04/09/2021 CMP and CBC normal ?   Today, the patient tells me she is coming back to our clinic because they moved back into the area.  They had moved to [facility] for some time where she was seen by a GI doctor there and was found to have hep C which was treated with Epclusa.  Tells me she was meant to follow-up with them after treatment to make sure her levels were 0.  Also tells me she had ultrasound around that time around 9 months ago and was found to have fatty liver and wonders what she needs to do about that.  Otherwise having no acute GI concerns. ?   Denies fever, chills, abdominal pain, weight loss, nausea, vomiting, heartburn, reflux or change in bowel habits. ? ?Past Medical History:  ?Diagnosis Date  ? Allergy   ? Anxiety and depression 01/30/2013  ? 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 BLASTOCYSTIS HOMINIS    ? CAP (community acquired pneumonia) 04/17/2015  ? Cervical cancer screening 01/25/2013  ? Chicken pox as a child  ? Fatty liver   ? Goiter   ? Mildly enlarged thyroid TSH normal   ? Hepatitis C   ? was treated in 2022  ? Hyperglycemia   ? Hyperlipidemia   ? Hypertension   ? Obesity   ? Obesity, unspecified 04/26/2013  ? Pneumonia 05/02/2013  ? Preventative health care 12/18/2012  ? Has seen Dr Carlota Raspberry for GYN in past  ? Type II or unspecified type diabetes mellitus without mention of complication, not stated as uncontrolled   ? Vitamin D deficiency   ? ? ?Past Surgical History:  ?Procedure Laterality Date  ? WISDOM TOOTH EXTRACTION  56 yrs old  ? ? ?Current Outpatient Medications  ?Medication Sig  Dispense Refill  ? albuterol (VENTOLIN HFA) 108 (90 Base) MCG/ACT inhaler Inhale 1-2 puffs into the lungs every 6 (six) hours as needed for wheezing or shortness of breath. 1 each 0  ? amoxicillin-clavulanate (AUGMENTIN) 875-125 MG tablet Take 1 tablet by mouth 2 (two) times daily. 20 tablet 0  ? atorvastatin (LIPITOR) 10 MG tablet Take 1 tablet (10 mg total) by mouth daily. 90 tablet 3  ? fluconazole (DIFLUCAN) 150 MG tablet One tab by mouth once as needed for for yeast infection. 1 tablet 0  ? losartan-hydrochlorothiazide (HYZAAR) 100-25 MG tablet Take 1 tablet by mouth daily. 90 tablet 1  ? valACYclovir (VALTREX) 500 MG tablet Take 500 mg by mouth as needed.    ? ?No current facility-administered medications for this visit.  ? ? ?Allergies as of 05/23/2021  ? (No Known Allergies)  ? ? ?Family History  ?Problem Relation Age of Onset  ? Heart attack Maternal Grandfather 75  ? Heart disease Maternal Grandfather   ? Diabetes Maternal Grandmother   ?     type 2  ? Blindness Maternal Grandmother   ? Hypertension Paternal Grandmother   ? Stroke Paternal Grandfather   ? Hypertension Sister   ? Hypertension Sister   ? ? ?Social History  ? ?Socioeconomic  History  ? Marital status: Married  ?  Spouse name: Not on file  ? Number of children: 0  ? Years of education: Not on file  ? Highest education level: Not on file  ?Occupational History  ? Occupation: Patent examiner  ?  Employer: [employer]  ?Tobacco Use  ? Smoking status: Never  ? Smokeless tobacco: Never  ?Vaping Use  ? Vaping Use: Never used  ?Substance and Sexual Activity  ? Alcohol use: No  ? Drug use: No  ? Sexual activity: Yes  ?  Partners: Male  ?  Comment: lives with husband and [place], [name], no dietary restrictions.  ?Other Topics Concern  ? Not on file  ?Social History Narrative  ? No Pork  ?   ?   ? ?Social Determinants of Health  ? ?Financial Resource Strain: Not on file  ?Food Insecurity: Not on file  ?Transportation Needs: Not on file  ?Physical  Activity: Not on file  ?Stress: Not on file  ?Social Connections: Not on file  ?Intimate Partner Violence: Not on file  ? ? ?Review of Systems:    ?Constitutional: No weight loss, fever or chills ?Skin: No rash ?Cardiovascular: No chest pain ?Respiratory: No SOB  ?Gastrointestinal: See HPI and otherwise negative ?Genitourinary: No dysuria  ?Neurological: No headache, dizziness or syncope ?Musculoskeletal: No new muscle or joint pain ?Hematologic: No bleeding or bruising ?Psychiatric: No history of depression or anxiety ? ? Physical Exam:  ?Vital signs: ?BP (!) 152/100   Pulse 93   Ht '5\' 7"'$  (1.702 m)   Wt (!) 331 lb (150.1 kg)   SpO2 97%   BMI 51.84 kg/m?   ? ?Constitutional:   Pleasant morbidly obese African-American female appears to be in NAD, Well developed, Well nourished, alert and cooperative ?Head:  Normocephalic and atraumatic. ?Eyes:   PEERL, EOMI. No icterus. Conjunctiva pink. ?Ears:  Normal auditory acuity. ?Neck:  Supple ?Throat: Oral cavity and pharynx without inflammation, swelling or lesion.  ?Respiratory: Respirations even and unlabored. Lungs clear to auscultation bilaterally.   No wheezes, crackles, or rhonchi.  ?Cardiovascular: Normal S1, S2. No MRG. Regular rate and rhythm. No peripheral edema, cyanosis or pallor.  ?Gastrointestinal:  Soft, nondistended, nontender. No rebound or guarding. Normal bowel sounds. No appreciable masses or hepatomegaly. ?Rectal:  Not performed.  ?Msk:  Symmetrical without gross deformities. Without edema, no deformity or joint abnormality.  ?Neurologic:  Alert and  oriented x4;  grossly normal neurologically.  ?Skin:   Dry and intact without significant lesions or rashes. ?Psychiatric: Demonstrates good judgement and reason without abnormal affect or behaviors. ? ?RELEVANT LABS AND IMAGING: ?CBC ?   ?Component Value Date/Time  ? WBC 7.1 04/09/2021 1544  ? RBC 5.89 (H) 04/09/2021 1544  ? HGB 13.3 04/09/2021 1544  ? HCT 43.0 04/09/2021 1544  ? PLT 228.0 04/09/2021  1544  ? MCV 73.0 (L) 04/09/2021 1544  ? MCH 23.2 (L) 11/03/2020 0538  ? MCHC 30.9 04/09/2021 1544  ? RDW 16.4 (H) 04/09/2021 1544  ? LYMPHSABS 2.6 11/03/2020 0538  ? MONOABS 0.5 11/03/2020 0538  ? EOSABS 0.1 11/03/2020 0538  ? BASOSABS 0.0 11/03/2020 0538  ? ? ?CMP  ?   ?Component Value Date/Time  ? NA 141 04/09/2021 1544  ? K 4.0 04/09/2021 1544  ? CL 101 04/09/2021 1544  ? CO2 27 04/09/2021 1544  ? GLUCOSE 78 04/09/2021 1544  ? BUN 13 04/09/2021 1544  ? CREATININE 0.98 04/09/2021 1544  ? CREATININE 0.89 10/30/2017 1552  ?  CALCIUM 9.7 04/09/2021 1544  ? PROT 7.8 04/09/2021 1544  ? ALBUMIN 3.9 04/09/2021 1544  ? AST 16 04/09/2021 1544  ? ALT 7 04/09/2021 1544  ? ALKPHOS 94 04/09/2021 1544  ? BILITOT 0.9 04/09/2021 1544  ? GFRNONAA >60 11/03/2020 0538  ? GFRAA >60 01/03/2015 1136  ? ? ?Assessment: ?1.  History of hepatitis C: Status posttreatment with Epclusa in the last 6 to 9 months ?2.  Fatty liver: Patient describes previous diagnosis of this, we do not have records ?3.  Colon cancer screening: Cologuard negative in 2021 ? ?Plan: ?1.  Needs repeat Cologuard in February 2024 for colon cancer screening ?2.  Discussed possibility of fatty liver and what recommendations are for this.  Slow and steady weight loss of 1 to 2 pounds per week.  Discussed decreasing carbs and sugars and eating things "from the earth".  Explained that if she is diligent about her diet and weight loss she can reverse this process. ?3.  Repeat right upper quadrant ultrasound now to see how she is doing. ?4.  We will request records from her GI doctor in [place] ?5.  Check hepatitis C quantitative values today per patient request ?6.  Patient would like to reestablish with Dr. Ardis Hughs.  She will follow in our clinic for recommendations/as needed. ? ?Ellouise Newer, PA-C ?[facility] ?05/23/2021, 2:24 PM ? ?Cc: Mosie Lukes, MD  ?

## 2021-05-24 ENCOUNTER — Ambulatory Visit (INDEPENDENT_AMBULATORY_CARE_PROVIDER_SITE_OTHER): Payer: BC Managed Care – PPO | Admitting: Family

## 2021-05-24 VITALS — BP 160/77 | HR 89 | Temp 98.6°F | Resp 16 | Wt 331.0 lb

## 2021-05-24 DIAGNOSIS — K118 Other diseases of salivary glands: Secondary | ICD-10-CM

## 2021-05-24 DIAGNOSIS — I10 Essential (primary) hypertension: Secondary | ICD-10-CM | POA: Diagnosis not present

## 2021-05-24 MED ORDER — AMLODIPINE BESYLATE 5 MG PO TABS: 5.0000 mg | ORAL_TABLET | Freq: Every day | ORAL | 3 refills | Status: DC

## 2021-05-24 NOTE — Progress Notes (Signed)
I agree with the above note, plan 

## 2021-05-24 NOTE — Assessment & Plan Note (Addendum)
BP Readings from Last 3 Encounters:  ?05/24/21 (!) 160/77  ?05/23/21 (!) 152/100  ?05/17/21 (!) 159/78  ? ?Uncontrolled. Advised pt to add amlodipine '5mg'$ .  ? ?

## 2021-05-24 NOTE — Assessment & Plan Note (Signed)
Still enlarged. Recommended CT neck for further evaluation.  ?

## 2021-05-24 NOTE — Progress Notes (Signed)
? ?Subjective:  ? ?By signing my name below, I, Jennifer Rich, attest that this documentation has been prepared under the direction and in the presence of Jennifer Alar NP, 05/24/2021  ? ? Patient ID: Jennifer Rich, female    DOB: March 13, 1965, 56 y.o.   MRN: [id number] ? ?Chief Complaint  ?Patient presents with  ? Hypertension  ?  Here for follow up  ? ? ?HPI ?Patient is in today for an office visit. ? ?Blood Pressure - Her blood pressure is not improving. She is currently taking 100 - 25 MG tablet of Losartan - Hydrochlorothiazide. She reports that she is beginning to exercise regularly at the gym.  ?BP Readings from Last 3 Encounters:  ?05/24/21 (!) 160/77  ?05/23/21 (!) 152/100  ?05/17/21 (!) 159/78  ? ?Parotitis - Her parotitis is improving.  ? ?Health Maintenance Due  ?Topic Date Due  ? FOOT EXAM  Never done  ? OPHTHALMOLOGY EXAM  Never done  ? COLONOSCOPY (Pts 45-71yr Insurance coverage will need to be confirmed)  Never done  ? PAP SMEAR-Modifier  01/26/2016  ? ? ?Past Medical History:  ?Diagnosis Date  ? Allergy   ? Anxiety and depression 01/30/2013  ? 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 BLASTOCYSTIS HOMINIS    ? CAP (community acquired pneumonia) 04/17/2015  ? Cervical cancer screening 01/25/2013  ? Chicken pox as a child  ? Fatty liver   ? Goiter   ? Mildly enlarged thyroid TSH normal   ? Hepatitis C   ? was treated in 2022  ? Hyperglycemia   ? Hyperlipidemia   ? Hypertension   ? Obesity   ? Obesity, unspecified 04/26/2013  ? Pneumonia 05/02/2013  ? Preventative health care 12/18/2012  ? Has seen Dr GCarlota Raspberryfor GYN in past  ? Type II or unspecified type diabetes mellitus without mention of complication, not stated as uncontrolled   ? Vitamin D deficiency   ? ? ?Past Surgical History:  ?Procedure Laterality Date  ? WISDOM TOOTH EXTRACTION  56yrs old  ? ? ?Family History  ?Problem Relation Age of Onset  ? Hypertension Sister   ? Hypertension Sister   ? Diabetes Maternal Grandmother   ?      type 2  ? Blindness Maternal Grandmother   ? Heart attack Maternal Grandfather 75  ? Heart disease Maternal Grandfather   ? Hypertension Paternal Grandmother   ? Stroke Paternal Grandfather   ? Colon cancer Neg Hx   ? Esophageal cancer Neg Hx   ? Stomach cancer Neg Hx   ? ? ?Social History  ? ?Socioeconomic History  ? Marital status: Married  ?  Spouse name: Not on file  ? Number of children: 0  ? Years of education: Not on file  ? Highest education level: Not on file  ?Occupational History  ? Occupation: SPatent examiner ?  Employer: [employer]  ?Tobacco Use  ? Smoking status: Never  ? Smokeless tobacco: Never  ?Vaping Use  ? Vaping Use: Never used  ?Substance and Sexual Activity  ? Alcohol use: No  ? Drug use: No  ? Sexual activity: Yes  ?  Partners: Male  ?  Comment: lives with husband and PDelia [name], no dietary restrictions.  ?Other Topics Concern  ? Not on file  ?Social History Narrative  ? No Pork  ?   ?   ? ?Social Determinants of Health  ? ?Financial Resource Strain: Not on file  ?Food Insecurity: Not on file  ?  Transportation Needs: Not on file  ?Physical Activity: Not on file  ?Stress: Not on file  ?Social Connections: Not on file  ?Intimate Partner Violence: Not on file  ? ? ?Outpatient Medications Prior to Visit  ?Medication Sig Dispense Refill  ? albuterol (VENTOLIN HFA) 108 (90 Base) MCG/ACT inhaler Inhale 1-2 puffs into the lungs every 6 (six) hours as needed for wheezing or shortness of breath. 1 each 0  ? amoxicillin-clavulanate (AUGMENTIN) 875-125 MG tablet Take 1 tablet by mouth 2 (two) times daily. 20 tablet 0  ? atorvastatin (LIPITOR) 10 MG tablet Take 1 tablet (10 mg total) by mouth daily. 90 tablet 3  ? fluconazole (DIFLUCAN) 150 MG tablet One tab by mouth once as needed for for yeast infection. 1 tablet 0  ? losartan-hydrochlorothiazide (HYZAAR) 100-25 MG tablet Take 1 tablet by mouth daily. 90 tablet 1  ? valACYclovir (VALTREX) 500 MG tablet Take 500 mg by mouth as needed.    ? ?No  facility-administered medications prior to visit.  ? ? ?No Known Allergies ? ?ROS ?See HPI ?   ?Objective:  ?  ?Physical Exam ?Constitutional:   ?   General: She is not in acute distress. ?   Appearance: Normal appearance. She is not ill-appearing.  ?HENT:  ?   Head: Normocephalic and atraumatic.  ?   Jaw: Swelling (1 inch mass under right posterior jaw) present.  ?   Right Ear: External ear normal.  ?   Left Ear: External ear normal.  ?Eyes:  ?   Extraocular Movements: Extraocular movements intact.  ?   Pupils: Pupils are equal, round, and reactive to light.  ?Cardiovascular:  ?   Rate and Rhythm: Normal rate and regular rhythm.  ?   Heart sounds: Normal heart sounds. No murmur heard. ?  No gallop.  ?Pulmonary:  ?   Effort: Pulmonary effort is normal. No respiratory distress.  ?   Breath sounds: Normal breath sounds. No wheezing or rales.  ?Skin: ?   General: Skin is warm and dry.  ?Neurological:  ?   Mental Status: She is alert and oriented to person, place, and time.  ?Psychiatric:     ?   Mood and Affect: Mood normal.     ?   Behavior: Behavior normal.     ?   Judgment: Judgment normal.  ? ? ?BP (!) 160/77 (BP Location: Left Arm, Patient Position: Sitting, Cuff Size: Large)   Pulse 89   Temp 98.6 ?F (37 ?C) (Oral)   Resp 16   Wt (!) 331 lb (150.1 kg)   SpO2 98%   BMI 51.84 kg/m?  ?Wt Readings from Last 3 Encounters:  ?05/24/21 (!) 331 lb (150.1 kg)  ?05/23/21 (!) 331 lb (150.1 kg)  ?05/17/21 (!) 334 lb (151.5 kg)  ? ? ?   ?Assessment & Plan:  ? ?Problem List Items Addressed This Visit   ? ?  ? Unprioritized  ? Parotid mass  ?  Still enlarged. Recommended CT neck for further evaluation.  ?  ?  ? Relevant Orders  ? CT Soft Tissue Neck W Contrast  ? Essential hypertension, benign - Primary  ?  BP Readings from Last 3 Encounters:  ?05/24/21 (!) 160/77  ?05/23/21 (!) 152/100  ?05/17/21 (!) 159/78  ?Uncontrolled. Advised pt to add amlodipine '5mg'$ .  ? ?  ?  ? Relevant Medications  ? amLODipine (NORVASC) 5 MG  tablet  ? Other Relevant Orders  ? Basic metabolic panel  ? ? ? ? ?  Meds ordered this encounter  ?Medications  ? amLODipine (NORVASC) 5 MG tablet  ?  Sig: Take 1 tablet (5 mg total) by mouth daily.  ?  Dispense:  30 tablet  ?  Refill:  3  ?  Order Specific Question:   Supervising Provider  ?  Answer:   Penni Homans A [[id number]]  ? ? ?I, Nance Pear, NP, personally preformed the services described in this documentation.  All medical record entries made by the scribe were at my direction and in my presence.  I have reviewed the chart and discharge instructions (if applicable) and agree that the record reflects my personal performance and is accurate and complete. 05/24/2021 ? ? ?I,Amber Collins,acting as a Education administrator for [employer], NP.,have documented all relevant documentation on the behalf of Nance Pear, NP,as directed by  Nance Pear, NP while in the presence of Nance Pear, NP. ? ? ? ?Nance Pear, NP ? ?

## 2021-05-24 NOTE — Patient Instructions (Signed)
Please add amlodipine 5mg once daily.  

## 2021-05-25 LAB — BASIC METABOLIC PANEL
BUN/Creatinine Ratio: 16 (calc) (ref 6–22)
BUN: 17 mg/dL (ref 7–25)
CO2: 20 mmol/L (ref 20–32)
Calcium: 10.1 mg/dL (ref 8.6–10.4)
Chloride: 100 mmol/L (ref 98–110)
Creat: 1.07 mg/dL — ABNORMAL HIGH (ref 0.50–1.03)
Glucose, Bld: 83 mg/dL (ref 65–99)
Potassium: 4.2 mmol/L (ref 3.5–5.3)
Sodium: 137 mmol/L (ref 135–146)

## 2021-05-27 LAB — HEPATITIS C RNA QUANTITATIVE
HCV Quantitative Log: <1.18 log IU/mL
HCV RNA, PCR, QN: <15 IU/mL

## 2021-05-30 ENCOUNTER — Ambulatory Visit (HOSPITAL_COMMUNITY): Payer: BC Managed Care – PPO

## 2021-05-30 ENCOUNTER — Encounter (HOSPITAL_COMMUNITY): Payer: Self-pay

## 2021-06-01 ENCOUNTER — Ambulatory Visit (HOSPITAL_BASED_OUTPATIENT_CLINIC_OR_DEPARTMENT_OTHER)
Admission: RE | Admit: 2021-06-01 | Discharge: 2021-06-01 | Disposition: A | Discharge status: Home / Self Care | Payer: BC Managed Care – PPO | Source: Ambulatory Visit | Attending: Physician Assistant | Admitting: Physician Assistant

## 2021-06-01 ENCOUNTER — Encounter (HOSPITAL_BASED_OUTPATIENT_CLINIC_OR_DEPARTMENT_OTHER): Payer: Self-pay

## 2021-06-01 ENCOUNTER — Ambulatory Visit (HOSPITAL_BASED_OUTPATIENT_CLINIC_OR_DEPARTMENT_OTHER)
Admission: RE | Admit: 2021-06-01 | Discharge: 2021-06-01 | Disposition: A | Discharge status: Home / Self Care | Payer: BC Managed Care – PPO | Source: Ambulatory Visit | Attending: Family | Admitting: Family

## 2021-06-01 DIAGNOSIS — Z8619 Personal history of other infectious and parasitic diseases: Secondary | ICD-10-CM

## 2021-06-01 DIAGNOSIS — K760 Fatty (change of) liver, not elsewhere classified: Secondary | ICD-10-CM | POA: Diagnosis not present

## 2021-06-01 DIAGNOSIS — K118 Other diseases of salivary glands: Secondary | ICD-10-CM | POA: Insufficient documentation

## 2021-06-01 DIAGNOSIS — R221 Localized swelling, mass and lump, neck: Secondary | ICD-10-CM | POA: Diagnosis not present

## 2021-06-01 MED ORDER — IOHEXOL 300 MG/ML  SOLN
75.0000 mL | Freq: Once | INTRAMUSCULAR | Status: AC | PRN
  Administered 2021-06-01: 75 mL via INTRAVENOUS

## 2021-06-03 ENCOUNTER — Encounter: Payer: Self-pay | Admitting: Family

## 2021-06-10 DIAGNOSIS — Z01419 Encounter for gynecological examination (general) (routine) without abnormal findings: Secondary | ICD-10-CM | POA: Diagnosis not present

## 2021-06-10 DIAGNOSIS — Z113 Encounter for screening for infections with a predominantly sexual mode of transmission: Secondary | ICD-10-CM | POA: Diagnosis not present

## 2021-06-10 DIAGNOSIS — Z6841 Body Mass Index (BMI) 40.0 and over, adult: Secondary | ICD-10-CM | POA: Diagnosis not present

## 2021-06-17 LAB — HM PAP SMEAR: HM Pap smear: NORMAL

## 2021-06-18 ENCOUNTER — Encounter: Payer: BC Managed Care – PPO | Admitting: Family

## 2021-06-19 ENCOUNTER — Other Ambulatory Visit: Payer: Self-pay

## 2021-06-19 ENCOUNTER — Ambulatory Visit (INDEPENDENT_AMBULATORY_CARE_PROVIDER_SITE_OTHER): Payer: BC Managed Care – PPO | Admitting: Family

## 2021-06-19 ENCOUNTER — Encounter: Payer: Self-pay | Admitting: Family

## 2021-06-19 VITALS — HR 92 | Temp 97.4°F | Wt 334.8 lb

## 2021-06-19 DIAGNOSIS — Z8619 Personal history of other infectious and parasitic diseases: Secondary | ICD-10-CM

## 2021-06-19 DIAGNOSIS — A539 Syphilis, unspecified: Secondary | ICD-10-CM | POA: Insufficient documentation

## 2021-06-19 NOTE — Assessment & Plan Note (Signed)
Ms. Ganus completed 12 weeks of Epclusa most recent Hepaittis C RNA level was undetectable indicating cure of disease with no further treatment being indicated. Discussed that Hepatitis C Antibody will always be positive and if screening is needed in the future to ask for Hepatitis C RNA level or viral load.  ?

## 2021-06-19 NOTE — Progress Notes (Signed)
? ?Subjective:  ? ? Patient ID: Jennifer Rich, female    DOB: 01/29/1966, 56 y.o.   MRN: [id number] ? ?Chief Complaint  ?Patient presents with  ? Hepatitis C  ? Syphilis  ? ? ?HPI: ? ?Jennifer Rich is a 56 y.o. female with previous medical history as detailed below presenting today for positive hepatitis C antibody test and low positive syphilis testing. ? ?Jennifer Rich was recently seen by her Gynecologist for routine well women care and was tested for STD at her request. RPR titer was positive at 1:2 with positive confirmatory treponemal testing. She also had a positive Hepatitis C antibody test. Was treated last year for chronic hepatitis C with 12 weeks of Epclusa and had achieved SVR12. Recent lab work completed on 05/23/21 with undetectable Hepatitis C RNA level indicating sustained cure.  ? ?Jennifer Rich was previously treated with 2.4 million units of Bicillin for syphilis in November of 2021 with RPR titer of 1:64. No follow up titer was completed until recent lab work noting RPR titer of 1:2 with positive confirmatory testing. Overall feeling well today with no rashes or lesions. Has not been sexually active recently.  ? ? ?Allergies  ?Allergen Reactions  ? Lisinopril Cough  ? ? ? ? ?Outpatient Medications Prior to Visit  ?Medication Sig Dispense Refill  ? albuterol (VENTOLIN HFA) 108 (90 Base) MCG/ACT inhaler Inhale 1-2 puffs into the lungs every 6 (six) hours as needed for wheezing or shortness of breath. 1 each 0  ? amLODipine (NORVASC) 5 MG tablet Take 1 tablet (5 mg total) by mouth daily. 30 tablet 3  ? atorvastatin (LIPITOR) 10 MG tablet Take 1 tablet (10 mg total) by mouth daily. 90 tablet 3  ? losartan-hydrochlorothiazide (HYZAAR) 100-25 MG tablet Take 1 tablet by mouth daily. 90 tablet 1  ? valACYclovir (VALTREX) 500 MG tablet Take 500 mg by mouth as needed.    ? amoxicillin-clavulanate (AUGMENTIN) 875-125 MG tablet Take 1 tablet by mouth 2 (two) times daily. (Patient not taking:  Reported on 06/19/2021) 20 tablet 0  ? fluconazole (DIFLUCAN) 150 MG tablet One tab by mouth once as needed for for yeast infection. (Patient not taking: Reported on 06/19/2021) 1 tablet 0  ? ?No facility-administered medications prior to visit.  ? ? ? ?Past Medical History:  ?Diagnosis Date  ? Allergy   ? Anxiety and depression 01/30/2013  ? 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 BLASTOCYSTIS HOMINIS    ? CAP (community acquired pneumonia) 04/17/2015  ? Cervical cancer screening 01/25/2013  ? Chicken pox as a child  ? Fatty liver   ? Goiter   ? Mildly enlarged thyroid TSH normal   ? Hepatitis C   ? was treated in 2022  ? Hyperglycemia   ? Hyperlipidemia   ? Hypertension   ? Obesity   ? Obesity, unspecified 04/26/2013  ? Pneumonia 05/02/2013  ? Preventative health care 12/18/2012  ? Has seen Dr Carlota Raspberry for GYN in past  ? Type II or unspecified type diabetes mellitus without mention of complication, not stated as uncontrolled   ? Vitamin D deficiency   ? ? ? ?Past Surgical History:  ?Procedure Laterality Date  ? WISDOM TOOTH EXTRACTION  56 yrs old  ? ? ? ? ? ?Review of Systems  ?Constitutional:  Negative for chills, fatigue, fever and unexpected weight change.  ?Respiratory:  Negative for cough, chest tightness, shortness of breath and wheezing.   ?Cardiovascular:  Negative for chest pain and leg swelling.  ?  Gastrointestinal:  Negative for abdominal distention, constipation, diarrhea, nausea and vomiting.  ?Neurological:  Negative for dizziness, weakness, light-headedness and headaches.  ?Hematological:  Does not bruise/bleed easily.  ?   ?Objective:  ?  ?Pulse 92   Temp (!) 97.4 ?F (36.3 ?C) (Temporal)   Wt (!) 334 lb 12.8 oz (151.9 kg)   SpO2 95%   BMI 52.44 kg/m?  ?Nursing note and vital signs reviewed. ? ?Physical Exam ?Constitutional:   ?   General: She is not in acute distress. ?   Appearance: She is well-developed.  ?Cardiovascular:  ?   Rate and Rhythm: Normal rate and regular rhythm.  ?   Heart  sounds: Normal heart sounds. No murmur heard. ?  No friction rub. No gallop.  ?Pulmonary:  ?   Effort: Pulmonary effort is normal. No respiratory distress.  ?   Breath sounds: Normal breath sounds. No wheezing or rales.  ?Chest:  ?   Chest wall: No tenderness.  ?Abdominal:  ?   General: Bowel sounds are normal. There is no distension.  ?   Palpations: Abdomen is soft. There is no mass.  ?   Tenderness: There is no abdominal tenderness. There is no guarding or rebound.  ?Skin: ?   General: Skin is warm and dry.  ?Neurological:  ?   Mental Status: She is alert and oriented to person, place, and time.  ?Psychiatric:     ?   Behavior: Behavior normal.     ?   Thought Content: Thought content normal.     ?   Judgment: Judgment normal.  ? ? ? ?   ? View : No data to display.  ?  ?  ?  ?  ?   ?Assessment & Plan:  ? ? ?Patient Active Problem List  ? Diagnosis Date Noted  ? Syphilis 06/19/2021  ? Parotid mass 05/24/2021  ? Parotitis 05/15/2021  ? Tinnitus of right ear 05/15/2021  ? Fatty liver 04/15/2021  ? Hx of hepatitis C 04/15/2021  ? Essential hypertension, benign 11/02/2013  ? 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 Obesity 04/26/2013  ? Anxiety and depression 01/30/2013  ? Cervical cancer screening 01/25/2013  ? Preventative health care 12/18/2012  ? Hyperlipidemia   ? Hyperglycemia   ? Vitamin D deficiency   ? Goiter   ? ? ? ?Problem List Items Addressed This Visit   ? ?  ? Digestive  ? Hx of hepatitis C - Primary  ?  Jennifer Rich completed 12 weeks of Epclusa most recent Hepaittis C RNA level was undetectable indicating cure of disease with no further treatment being indicated. Discussed that Hepatitis C Antibody will always be positive and if screening is needed in the future to ask for Hepatitis C RNA level or viral load.  ? ?  ?  ?  ? Other  ? Syphilis  ?  Jennifer Rich was previously positive for syphilis with titer of 1:64 and treated with penicillin. New titer of 1:2 would be consistent with previous  treatment and likely serofast or slowly decreasing. No additional treatment is indicated unless titer increases to 1:8.  ? ?  ?  ? ? ? ?I am having Jennifer Rich maintain her albuterol, atorvastatin, amoxicillin-clavulanate, fluconazole, losartan-hydrochlorothiazide, valACYclovir, and amLODipine. ? ?Follow-up: Follow up with ID as needed.  ? ? ?Terri Piedra, MSN, FNP-C ?Nurse Practitioner ?[facility] ?[facility] ?[facility] Main number: [phone] ? ? ?

## 2021-06-19 NOTE — Patient Instructions (Signed)
Nice to see you. ? ?No treatment is necessary. ? ?Follow up as needed with Gynecology and/or Primary Care.  ? ?Have a great day and stay safe! ? ?

## 2021-06-19 NOTE — Assessment & Plan Note (Signed)
Jennifer Rich was previously positive for syphilis with titer of 1:64 and treated with penicillin. New titer of 1:2 would be consistent with previous treatment and likely serofast or slowly decreasing. No additional treatment is indicated unless titer increases to 1:8.  ?

## 2021-06-21 ENCOUNTER — Ambulatory Visit: Payer: BC Managed Care – PPO | Admitting: Family

## 2021-06-25 ENCOUNTER — Ambulatory Visit: Payer: BC Managed Care – PPO | Admitting: Family

## 2021-06-28 ENCOUNTER — Encounter: Payer: Self-pay | Admitting: Family Medicine

## 2021-07-09 ENCOUNTER — Other Ambulatory Visit: Payer: BC Managed Care – PPO

## 2021-07-17 ENCOUNTER — Other Ambulatory Visit: Payer: BC Managed Care – PPO

## 2021-07-23 ENCOUNTER — Telehealth (INDEPENDENT_AMBULATORY_CARE_PROVIDER_SITE_OTHER): Payer: BC Managed Care – PPO | Admitting: Family Medicine

## 2021-07-23 ENCOUNTER — Encounter: Payer: Self-pay | Admitting: Family Medicine

## 2021-07-23 VITALS — BP 146/100 | Ht 67.0 in | Wt 334.0 lb

## 2021-07-23 DIAGNOSIS — I10 Essential (primary) hypertension: Secondary | ICD-10-CM

## 2021-07-23 DIAGNOSIS — F419 Anxiety disorder, unspecified: Secondary | ICD-10-CM

## 2021-07-23 DIAGNOSIS — R739 Hyperglycemia, unspecified: Secondary | ICD-10-CM | POA: Diagnosis not present

## 2021-07-23 DIAGNOSIS — Z1211 Encounter for screening for malignant neoplasm of colon: Secondary | ICD-10-CM

## 2021-07-23 DIAGNOSIS — F32A Depression, unspecified: Secondary | ICD-10-CM

## 2021-07-23 DIAGNOSIS — E669 Obesity, unspecified: Secondary | ICD-10-CM

## 2021-07-23 DIAGNOSIS — E278 Other specified disorders of adrenal gland: Secondary | ICD-10-CM

## 2021-07-23 DIAGNOSIS — K760 Fatty (change of) liver, not elsewhere classified: Secondary | ICD-10-CM

## 2021-07-23 DIAGNOSIS — E559 Vitamin D deficiency, unspecified: Secondary | ICD-10-CM

## 2021-07-23 DIAGNOSIS — E785 Hyperlipidemia, unspecified: Secondary | ICD-10-CM

## 2021-07-23 MED ORDER — SPIRONOLACTONE 25 MG PO TABS: 25.0000 mg | ORAL_TABLET | Freq: Two times a day (BID) | ORAL | 1 refills | Status: DC

## 2021-07-23 NOTE — Assessment & Plan Note (Signed)
Not well controlled. Patient stopped her Amlodipine because several family members had trouble with edema but the patient herself never did. For now will increase Aldactone to 25 mg po bid and consider increasing to 50 mg po bid as needed and as tolerated. She will come in for cmp and bp check in about a month to reassess. Encouraged heart healthy diet such as the DASH diet and exercise as tolerated.

## 2021-07-23 NOTE — Assessment & Plan Note (Signed)
She does endorse some anxiety but overall she feels she is doing well. No need for medications at this time.

## 2021-07-23 NOTE — Assessment & Plan Note (Signed)
Patient had a CT abd with and without at [facility] in march 2022 which showed a left adrenal mass 16x16 mm. Will repeat CT scan to assess stability. Characteristics favored a benign diagnosis.

## 2021-07-23 NOTE — Assessment & Plan Note (Signed)
>>  ASSESSMENT AND PLAN FOR HYPERGLYCEMIA WRITTEN ON 07/23/2021 12:08 PM BY BLYTH, STACEY A, MD  [id number] acceptable, minimize simple carbs. Increase exercise as tolerated.

## 2021-07-23 NOTE — Assessment & Plan Note (Signed)
hgba1c acceptable, minimize simple carbs. Increase exercise as tolerated.  

## 2021-07-23 NOTE — Assessment & Plan Note (Signed)
Supplement and monitor. She has been taking 2000 IU OTC since her last set of lab work

## 2021-07-23 NOTE — Assessment & Plan Note (Signed)
Tolerating statin, encouraged heart healthy diet, avoid trans fats, minimize simple carbs and saturated fats. Increase exercise as tolerated 

## 2021-07-23 NOTE — Progress Notes (Signed)
Subjective:    Patient ID: Jennifer Rich, female    DOB: 15-Jan-1966, 56 y.o.   MRN: [id number]  Chief Complaint  Patient presents with   Follow-up    HPI Patient is in today for a follow up on chronic medical concerns. No recent febrile illness or acute hospitalizations. She is ready to repeat a CT abdomen after an adrenal mass was found on CT at [place] a year ago. No abdominal pain or acute concerns int his regard. She notes her BP has been running high at home since she stopped her Amlodipine but she is asymptomatic. She stopped secondary to several family members having trouble with the same medication but she herself did not experience any concerning side effects. Denies CP/palp/SOB/HA/congestion/fevers/GI or GU c/o. 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   BLASTOCYSTIS HOMINIS     CAP (community acquired pneumonia) 04/17/2015   Cervical cancer screening 01/25/2013   Chicken pox as a child   Fatty liver    Goiter    Mildly enlarged thyroid TSH normal    Hepatitis C    was treated in 2022   Hyperglycemia    Hyperlipidemia    Hypertension    Obesity    Obesity, unspecified 04/26/2013   Pneumonia 05/02/2013   Preventative health care 12/18/2012   Has seen Dr Carlota Raspberry for GYN in past   Type II or unspecified type diabetes mellitus without mention of complication, not stated as uncontrolled    Vitamin D deficiency     Past Surgical History:  Procedure Laterality Date   WISDOM TOOTH EXTRACTION  56 yrs old    Family History  Problem Relation Age of Onset   Hypertension Sister    Hypertension Sister    Diabetes Maternal Grandmother        type 2   Blindness Maternal Grandmother    Heart attack Maternal Grandfather 19   Heart disease Maternal Grandfather    Hypertension Paternal Grandmother    Stroke Paternal Grandfather    Colon cancer Neg Hx     Esophageal cancer Neg Hx    Stomach cancer Neg Hx     Social History   Socioeconomic History   Marital status: Married    Spouse name: Not on file   Number of children: 0   Years of education: Not on file   Highest education level: Not on file  Occupational History   Occupation: Research officer, political party: [employer]  Tobacco Use   Smoking status: Never   Smokeless tobacco: Never  Vaping Use   Vaping Use: Never used  Substance and Sexual Activity   Alcohol use: No   Drug use: No   Sexual activity: Yes    Partners: Male    Comment: lives with husband and [place], [name], no dietary restrictions.  Other Topics Concern   Not on file  Social History Narrative   No Pork         Social Determinants of Health   Financial Resource Strain: Not on file  Food Insecurity: Not on file  Transportation Needs: Not on file  Physical Activity: Not on file  Stress: Not on file  Social Connections: Not on file  Intimate Partner Violence: Not on file    Outpatient Medications Prior to Visit  Medication Sig Dispense Refill   albuterol (VENTOLIN HFA) 108 (90 Base) MCG/ACT  inhaler Inhale 1-2 puffs into the lungs every 6 (six) hours as needed for wheezing or shortness of breath. 1 each 0   atorvastatin (LIPITOR) 10 MG tablet Take 1 tablet (10 mg total) by mouth daily. 90 tablet 3   losartan-hydrochlorothiazide (HYZAAR) 100-25 MG tablet Take 1 tablet by mouth daily. 90 tablet 1   valACYclovir (VALTREX) 500 MG tablet Take 500 mg by mouth as needed.     spironolactone (ALDACTONE) 25 MG tablet spironolactone 25 mg tablet     amLODipine (NORVASC) 5 MG tablet Take 1 tablet (5 mg total) by mouth daily. (Patient not taking: Reported on 07/23/2021) 30 tablet 3   amoxicillin-clavulanate (AUGMENTIN) 875-125 MG tablet Take 1 tablet by mouth 2 (two) times daily. (Patient not taking: Reported on 06/19/2021) 20 tablet 0   fluconazole (DIFLUCAN) 150 MG tablet One tab by mouth once as needed for for  yeast infection. (Patient not taking: Reported on 06/19/2021) 1 tablet 0   losartan (COZAAR) 50 MG tablet Take 50 mg by mouth 2 (two) times daily.     No facility-administered medications prior to visit.    Allergies  Allergen Reactions   Lisinopril Cough    Review of Systems  Constitutional:  Negative for fever and malaise/fatigue.  HENT:  Negative for congestion.   Eyes:  Negative for blurred vision.  Respiratory:  Negative for shortness of breath.   Cardiovascular:  Negative for chest pain, palpitations and leg swelling.  Gastrointestinal:  Negative for abdominal pain, blood in stool and nausea.  Genitourinary:  Negative for dysuria and frequency.  Musculoskeletal:  Negative for falls.  Skin:  Negative for rash.  Neurological:  Negative for dizziness, loss of consciousness and headaches.  Endo/Heme/Allergies:  Negative for environmental allergies.  Psychiatric/Behavioral:  Negative for depression. The patient is not nervous/anxious.       Objective:    Physical Exam Constitutional:      General: She is not in acute distress.    Appearance: Normal appearance. She is not ill-appearing or toxic-appearing.  HENT:     Head: Normocephalic and atraumatic.     Right Ear: External ear normal.     Left Ear: External ear normal.     Nose: Nose normal.  Eyes:     General:        Right eye: No discharge.        Left eye: No discharge.  Pulmonary:     Effort: Pulmonary effort is normal.  Skin:    Findings: No rash.  Neurological:     Mental Status: She is alert and oriented to person, place, and time.  Psychiatric:        Behavior: Behavior normal.    BP (!) 146/100   Ht '5\' 7"'$  (1.702 m)   Wt (!) 334 lb (151.5 kg)   BMI 52.31 kg/m  Wt Readings from Last 3 Encounters:  07/23/21 (!) 334 lb (151.5 kg)  06/19/21 (!) 334 lb 12.8 oz (151.9 kg)  05/24/21 (!) 331 lb (150.1 kg)    Diabetic Foot Exam - Simple   No data filed    Lab Results  Component Value Date   WBC 7.1  04/09/2021   HGB 13.3 04/09/2021   HCT 43.0 04/09/2021   PLT 228.0 04/09/2021   GLUCOSE 83 05/24/2021   CHOL 220 (H) 04/09/2021   TRIG 158.0 (H) 04/09/2021   HDL 49.40 04/09/2021   LDLCALC 139 (H) 04/09/2021   ALT 7 04/09/2021   AST 16 04/09/2021   NA  137 05/24/2021   K 4.2 05/24/2021   CL 100 05/24/2021   CREATININE 1.07 (H) 05/24/2021   BUN 17 05/24/2021   CO2 20 05/24/2021   TSH 3.03 04/09/2021   HGBA1C 7.1 (H) 04/09/2021    Lab Results  Component Value Date   TSH 3.03 04/09/2021   Lab Results  Component Value Date   WBC 7.1 04/09/2021   HGB 13.3 04/09/2021   HCT 43.0 04/09/2021   MCV 73.0 (L) 04/09/2021   PLT 228.0 04/09/2021   Lab Results  Component Value Date   NA 137 05/24/2021   K 4.2 05/24/2021   CO2 20 05/24/2021   GLUCOSE 83 05/24/2021   BUN 17 05/24/2021   CREATININE 1.07 (H) 05/24/2021   BILITOT 0.9 04/09/2021   ALKPHOS 94 04/09/2021   AST 16 04/09/2021   ALT 7 04/09/2021   PROT 7.8 04/09/2021   ALBUMIN 3.9 04/09/2021   CALCIUM 10.1 05/24/2021   ANIONGAP 8 11/03/2020   GFR 65.18 04/09/2021   Lab Results  Component Value Date   CHOL 220 (H) 04/09/2021   Lab Results  Component Value Date   HDL 49.40 04/09/2021   Lab Results  Component Value Date   LDLCALC 139 (H) 04/09/2021   Lab Results  Component Value Date   TRIG 158.0 (H) 04/09/2021   Lab Results  Component Value Date   CHOLHDL 4 04/09/2021   Lab Results  Component Value Date   HGBA1C 7.1 (H) 04/09/2021       Assessment & Plan:   Problem List Items Addressed This Visit     Hyperlipidemia    Tolerating statin, encouraged heart healthy diet, avoid trans fats, minimize simple carbs and saturated fats. Increase exercise as tolerated       Relevant Medications   spironolactone (ALDACTONE) 25 MG tablet   Other Relevant Orders   Lipid panel   Hyperglycemia    hgba1c acceptable, minimize simple carbs. Increase exercise as tolerated.       Relevant Orders    Hemoglobin A1c   Vitamin D deficiency    Supplement and monitor. She has been taking 2000 IU OTC since her last set of lab work       Relevant Orders   VITAMIN D 25 Hydroxy (Vit-D Deficiency, Fractures)   Anxiety and depression    She does endorse some anxiety but overall she feels she is doing well. No need for medications at this time.        Obesity   Essential hypertension, benign - Primary    Not well controlled. Patient stopped her Amlodipine because several family members had trouble with edema but the patient herself never did. For now will increase Aldactone to 25 mg po bid and consider increasing to 50 mg po bid as needed and as tolerated. She will come in for cmp and bp check in about a month to reassess. Encouraged heart healthy diet such as the DASH diet and exercise as tolerated.        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 Will repeat CT scan to assess stability. Characteristics favored a benign diagnosis.        Relevant Orders  CT ABDOMEN W WO CONTRAST   Other Visit Diagnoses     Colon cancer screening       Relevant Orders   Cologuard       I have discontinued Jerelyn A. Barwick's amoxicillin-clavulanate, fluconazole, amLODipine, and losartan. I have also changed her spironolactone. Additionally, I am having her maintain her albuterol, atorvastatin, losartan-hydrochlorothiazide, and valACYclovir.  Meds ordered this encounter  Medications   spironolactone (ALDACTONE) 25 MG tablet    Sig: Take 1 tablet (25 mg total) by mouth 2 (two) times daily.    Dispense:  60 tablet    Refill:  1

## 2021-07-31 ENCOUNTER — Ambulatory Visit
Admission: RE | Admit: 2021-07-31 | Discharge: 2021-07-31 | Disposition: A | Discharge status: Home / Self Care | Payer: BC Managed Care – PPO | Source: Ambulatory Visit | Attending: Family Medicine | Admitting: Family Medicine

## 2021-07-31 ENCOUNTER — Encounter (HOSPITAL_BASED_OUTPATIENT_CLINIC_OR_DEPARTMENT_OTHER): Payer: Self-pay

## 2021-07-31 ENCOUNTER — Other Ambulatory Visit: Payer: Self-pay | Admitting: Family Medicine

## 2021-07-31 ENCOUNTER — Ambulatory Visit (HOSPITAL_BASED_OUTPATIENT_CLINIC_OR_DEPARTMENT_OTHER)
Admission: RE | Admit: 2021-07-31 | Discharge: 2021-07-31 | Disposition: A | Discharge status: Home / Self Care | Payer: BC Managed Care – PPO | Source: Ambulatory Visit | Attending: Family Medicine | Admitting: Family Medicine

## 2021-07-31 DIAGNOSIS — R928 Other abnormal and inconclusive findings on diagnostic imaging of breast: Secondary | ICD-10-CM

## 2021-07-31 DIAGNOSIS — K429 Umbilical hernia without obstruction or gangrene: Secondary | ICD-10-CM | POA: Diagnosis not present

## 2021-07-31 DIAGNOSIS — K828 Other specified diseases of gallbladder: Secondary | ICD-10-CM | POA: Diagnosis not present

## 2021-07-31 DIAGNOSIS — K760 Fatty (change of) liver, not elsewhere classified: Secondary | ICD-10-CM | POA: Diagnosis not present

## 2021-07-31 DIAGNOSIS — E278 Other specified disorders of adrenal gland: Secondary | ICD-10-CM | POA: Diagnosis not present

## 2021-07-31 DIAGNOSIS — N6489 Other specified disorders of breast: Secondary | ICD-10-CM | POA: Diagnosis not present

## 2021-07-31 DIAGNOSIS — R922 Inconclusive mammogram: Secondary | ICD-10-CM | POA: Diagnosis not present

## 2021-07-31 MED ORDER — IOHEXOL 300 MG/ML  SOLN
100.0000 mL | Freq: Once | INTRAMUSCULAR | Status: AC | PRN
  Administered 2021-07-31: 100 mL via INTRAVENOUS

## 2021-08-01 ENCOUNTER — Encounter: Payer: Self-pay | Admitting: Family Medicine

## 2021-08-01 MED ORDER — WEGOVY 0.25 MG/0.5ML ~~LOC~~ SOAJ: 0.2500 mg | SUBCUTANEOUS | 0 refills | Status: DC

## 2021-08-02 ENCOUNTER — Telehealth: Payer: Self-pay

## 2021-08-02 ENCOUNTER — Other Ambulatory Visit: Payer: BC Managed Care – PPO

## 2021-08-02 NOTE — Telephone Encounter (Signed)
Prior was started for [facility] 0.'25MG'$ /0.5ML auto-injectors  Key: [id number]  Awaiting response

## 2021-08-07 NOTE — Telephone Encounter (Addendum)
Called [facility] [phone]) to follow up on prior auth and it is still pending.

## 2021-08-13 ENCOUNTER — Other Ambulatory Visit: Payer: Self-pay | Admitting: Family Medicine

## 2021-08-13 MED ORDER — SAXENDA 18 MG/3ML ~~LOC~~ SOPN: PEN_INJECTOR | SUBCUTANEOUS | 0 refills | Status: AC

## 2021-08-14 ENCOUNTER — Telehealth: Payer: Self-pay

## 2021-08-14 NOTE — Telephone Encounter (Signed)
PA initiated via Covermymeds; KEY:  [id number]. PA approved.   [id number];WVTVNR:[id number];Review Type:Prior Auth;Coverage Start Date:07/15/2021;Coverage End Date:12/12/2021;

## 2021-08-14 NOTE — Telephone Encounter (Signed)
PA denied.   [id number];OTLXBW:[id number];Review Type:Prior Auth;Appeal Information: Attention:ATTN: [place] [id number]. HRCBU,LA,[id number] OZYYQ:[phone] GQB:[phone]; Important - Please read the below note on eAppeals: Please reference the denial letter for information on the rights for an appeal, rationale for the denial, and how to submit an appeal including if any information is needed to support the appeal. Note about urgent situations - Generally, an urgent situation is one which, in the opinion of the provider, the health of the patient may be in serious jeopardy or may experience pain that cannot be adequately controlled while waiting for a decision on the appeal.;

## 2021-08-14 NOTE — Telephone Encounter (Signed)
Patient was switched to [facility] and it was approved.    If patient wanted Wegovy again then we would just need to call and restart PA to answer questions correctly.

## 2021-08-23 ENCOUNTER — Ambulatory Visit: Payer: BC Managed Care – PPO

## 2021-08-23 ENCOUNTER — Other Ambulatory Visit: Payer: BC Managed Care – PPO

## 2021-08-26 ENCOUNTER — Telehealth: Payer: Self-pay | Admitting: Family Medicine

## 2021-08-26 NOTE — Telephone Encounter (Signed)
Patient states that [facility] on [address] finally got Wegovy in their stock and according to the pharmacy since she is first on the list they will be able to give it to her but they need a PA in order for her insurance to cover it. Patient states she does not want the saxenda and needs the wegovy prescription and PA sent to [facility] asap so they can give it to her or else they will give it to someone else. Please advise.

## 2021-08-27 ENCOUNTER — Ambulatory Visit (INDEPENDENT_AMBULATORY_CARE_PROVIDER_SITE_OTHER): Payer: BC Managed Care – PPO

## 2021-08-27 ENCOUNTER — Other Ambulatory Visit (INDEPENDENT_AMBULATORY_CARE_PROVIDER_SITE_OTHER): Payer: BC Managed Care – PPO

## 2021-08-27 DIAGNOSIS — R739 Hyperglycemia, unspecified: Secondary | ICD-10-CM

## 2021-08-27 DIAGNOSIS — E785 Hyperlipidemia, unspecified: Secondary | ICD-10-CM | POA: Diagnosis not present

## 2021-08-27 DIAGNOSIS — E559 Vitamin D deficiency, unspecified: Secondary | ICD-10-CM

## 2021-08-27 DIAGNOSIS — E875 Hyperkalemia: Secondary | ICD-10-CM

## 2021-08-27 DIAGNOSIS — I10 Essential (primary) hypertension: Secondary | ICD-10-CM

## 2021-08-27 LAB — COMPREHENSIVE METABOLIC PANEL
ALT: 10 U/L (ref 0–35)
AST: 18 U/L (ref 0–37)
Albumin: 3.8 g/dL (ref 3.5–5.2)
Alkaline Phosphatase: 85 U/L (ref 39–117)
BUN: 15 mg/dL (ref 6–23)
CO2: 31 mEq/L (ref 19–32)
Calcium: 9.3 mg/dL (ref 8.4–10.5)
Chloride: 100 mEq/L (ref 96–112)
Creatinine, Ser: 0.98 mg/dL (ref 0.40–1.20)
GFR: 65 mL/min (ref >60.00)
Glucose, Bld: 135 mg/dL — ABNORMAL HIGH (ref 70–99)
Potassium: 4 mEq/L (ref 3.5–5.1)
Sodium: 135 mEq/L (ref 135–145)
Total Bilirubin: 1 mg/dL (ref 0.2–1.2)
Total Protein: 7.2 g/dL (ref 6.0–8.3)

## 2021-08-27 LAB — CBC
HCT: 40.1 % (ref 36.0–46.0)
Hemoglobin: 12.7 g/dL (ref 12.0–15.0)
MCHC: 31.5 g/dL (ref 30.0–36.0)
MCV: 75.1 fl — ABNORMAL LOW (ref 78.0–100.0)
Platelets: 193 10*3/uL (ref 150.0–400.0)
RBC: 5.34 Mil/uL — ABNORMAL HIGH (ref 3.87–5.11)
RDW: 15.9 % — ABNORMAL HIGH (ref 11.5–15.5)
WBC: 5.3 10*3/uL (ref 4.0–10.5)

## 2021-08-27 LAB — LIPID PANEL
Cholesterol: 188 mg/dL (ref 0–200)
HDL: 44.1 mg/dL (ref >39.00)
LDL Cholesterol: 125 mg/dL — ABNORMAL HIGH (ref 0–99)
NonHDL: 143.41
Total CHOL/HDL Ratio: 4
Triglycerides: 91 mg/dL (ref 0.0–149.0)
VLDL: 18.2 mg/dL (ref 0.0–40.0)

## 2021-08-27 LAB — HEMOGLOBIN A1C: Hgb A1c MFr Bld: 7.6 % — ABNORMAL HIGH (ref 4.6–6.5)

## 2021-08-27 LAB — VITAMIN D 25 HYDROXY (VIT D DEFICIENCY, FRACTURES): VITD: 25.92 ng/mL — ABNORMAL LOW (ref 30.00–100.00)

## 2021-08-28 NOTE — Addendum Note (Signed)
Addended by: Lynnea Ferrier R on: 08/28/2021 11:05 AM   Modules accepted: Orders

## 2021-08-29 NOTE — Telephone Encounter (Signed)
Message from plan: CaseId:[id number];Status:Approved;Review Type:Prior Auth;Coverage Start Date:07/27/2021;Coverage End Date:03/24/2022;  Key: [id number]

## 2021-09-02 ENCOUNTER — Encounter: Payer: Self-pay | Admitting: Physician Assistant

## 2021-09-02 ENCOUNTER — Encounter: Payer: BC Managed Care – PPO | Admitting: Physician Assistant

## 2021-09-02 NOTE — Progress Notes (Signed)
Error

## 2021-09-02 NOTE — Progress Notes (Signed)
Patient is out of state. Directed to [place].

## 2021-09-11 DIAGNOSIS — L281 Prurigo nodularis: Secondary | ICD-10-CM | POA: Diagnosis not present

## 2021-09-13 ENCOUNTER — Encounter: Payer: Self-pay | Admitting: Family Medicine

## 2021-09-13 ENCOUNTER — Other Ambulatory Visit: Payer: Self-pay

## 2021-09-13 DIAGNOSIS — Z1211 Encounter for screening for malignant neoplasm of colon: Secondary | ICD-10-CM

## 2021-09-13 MED ORDER — SEMAGLUTIDE-WEIGHT MANAGEMENT 0.5 MG/0.5ML ~~LOC~~ SOAJ: 0.5000 mg | SUBCUTANEOUS | 0 refills | Status: DC

## 2021-09-13 NOTE — Telephone Encounter (Signed)
Jennifer Rich, we have reordered Cologuard for you. Let me know if you don't get the kit. Thanks!

## 2021-09-26 ENCOUNTER — Telehealth (INDEPENDENT_AMBULATORY_CARE_PROVIDER_SITE_OTHER): Payer: BC Managed Care – PPO | Admitting: Family Medicine

## 2021-09-26 ENCOUNTER — Encounter: Payer: Self-pay | Admitting: Family Medicine

## 2021-09-26 DIAGNOSIS — R739 Hyperglycemia, unspecified: Secondary | ICD-10-CM

## 2021-09-26 DIAGNOSIS — I10 Essential (primary) hypertension: Secondary | ICD-10-CM

## 2021-09-26 DIAGNOSIS — E559 Vitamin D deficiency, unspecified: Secondary | ICD-10-CM | POA: Diagnosis not present

## 2021-09-26 DIAGNOSIS — E785 Hyperlipidemia, unspecified: Secondary | ICD-10-CM

## 2021-09-26 DIAGNOSIS — E669 Obesity, unspecified: Secondary | ICD-10-CM

## 2021-09-26 MED ORDER — METOPROLOL SUCCINATE ER 25 MG PO TB24: 25.0000 mg | ORAL_TABLET | Freq: Every day | ORAL | 1 refills | Status: DC

## 2021-09-26 MED ORDER — SPIRONOLACTONE 25 MG PO TABS: 25.0000 mg | ORAL_TABLET | Freq: Three times a day (TID) | ORAL | 3 refills | Status: DC

## 2021-09-26 NOTE — Progress Notes (Signed)
MyChart Video Visit    Virtual Visit via Video Note   This visit type was conducted due to national recommendations for restrictions regarding the COVID-19 Pandemic (e.g. social distancing) in an effort to limit this patient's exposure and mitigate transmission in our community. This patient is at least at moderate risk for complications without adequate follow up. This format is felt to be most appropriate for this patient at this time. Physical exam was limited by quality of the video and audio technology used for the visit. Tomekia, CMA was able to get the patient set up on a video visit.  Patient location: Home patient and provider in visit Provider location: Office  I discussed the limitations of evaluation and management by telemedicine and the availability of in person appointments. The patient expressed understanding and agreed to proceed.  Visit Date: 09/26/2021  Today's healthcare provider: Penni Homans, MD     Subjective:    Patient ID: Jennifer Rich, female    DOB: [birth year], 56 y.o.   MRN: [id number]  Chief Complaint  Patient presents with   Follow-up    HPI Patient is in today for a virtual visit.  Cramps: She complains of cramps in her legs and feet when she wakes up. She reports that the cramps have been less severe this past week.   Blood pressure: She reports that her blood pressure has been decreasing and was 140/93 the last time she checked. She denies headaches related to elevated blood pressure. She has also been measuring her heart rate at home and states that it has been between 84-90. She is currently taking Losartan-Hydrochlorothiazide 100-25 mg and Spironolactone 25 mg to manage her blood pressure. BP Readings from Last 3 Encounters:  07/23/21 (!) 146/100  05/24/21 (!) 160/77  05/23/21 (!) 152/100   Pulse Readings from Last 3 Encounters:  06/19/21 92  05/24/21 89  05/23/21 93   Social history: She states that she does not drink  alcoholic beverages.   Diet: She states that she attempts to avoid processed meat.   Wegovy: She has been taking Wegovy 0.25 mg to manage her weight and is okay with increasing the dosage 0.5 mg.  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   BLASTOCYSTIS HOMINIS     CAP (community acquired pneumonia) 04/17/2015   Cervical cancer screening 01/25/2013   Chicken pox as a child   Fatty liver    Goiter    Mildly enlarged thyroid TSH normal    Hepatitis C    was treated in 2022   Hyperglycemia    Hyperlipidemia    Hypertension    Obesity    Obesity, unspecified 04/26/2013   Pneumonia 05/02/2013   Preventative health care 12/18/2012   Has seen Dr Carlota Raspberry for GYN in past   Type II or unspecified type diabetes mellitus without mention of complication, not stated as uncontrolled    Vitamin D deficiency     Past Surgical History:  Procedure Laterality Date   WISDOM TOOTH EXTRACTION  56 yrs old    Family History  Problem Relation Age of Onset   Hypertension Sister    Hypertension Sister    Diabetes Maternal Grandmother  type 2   Blindness Maternal Grandmother    Heart attack Maternal Grandfather 75   Heart disease Maternal Grandfather    Hypertension Paternal Grandmother    Stroke Paternal Grandfather    Colon cancer Neg Hx    Esophageal cancer Neg Hx    Stomach cancer Neg Hx     Social History   Socioeconomic History   Marital status: Married    Spouse name: Not on file   Number of children: 0   Years of education: Not on file   Highest education level: Not on file  Occupational History   Occupation: Research officer, political party: [employer]  Tobacco Use   Smoking status: Never   Smokeless tobacco: Never  Vaping Use   Vaping  Use: Never used  Substance and Sexual Activity   Alcohol use: No   Drug use: No   Sexual activity: Yes    Partners: Male    Comment: lives with husband and [place], [name], no dietary restrictions.  Other Topics Concern   Not on file  Social History Narrative   No Pork         Social Determinants of Health   Financial Resource Strain: Not on file  Food Insecurity: Not on file  Transportation Needs: Not on file  Physical Activity: Not on file  Stress: Not on file  Social Connections: Not on file  Intimate Partner Violence: Not on file    Outpatient Medications Prior to Visit  Medication Sig Dispense Refill   albuterol (VENTOLIN HFA) 108 (90 Base) MCG/ACT inhaler Inhale 1-2 puffs into the lungs every 6 (six) hours as needed for wheezing or shortness of breath. 1 each 0   atorvastatin (LIPITOR) 10 MG tablet Take 1 tablet (10 mg total) by mouth daily. 90 tablet 3   losartan-hydrochlorothiazide (HYZAAR) 100-25 MG tablet Take 1 tablet by mouth daily. 90 tablet 1   [START ON 10/12/2021] Semaglutide-Weight Management 0.5 MG/0.5ML SOAJ Inject 0.5 mg into the skin once a week for 28 days. 2 mL 0   valACYclovir (VALTREX) 500 MG tablet Take 500 mg by mouth as needed.     spironolactone (ALDACTONE) 25 MG tablet Take 1 tablet (25 mg total) by mouth 2 (two) times daily. 60 tablet 1   No facility-administered medications prior to visit.    Allergies  Allergen Reactions   Lisinopril Cough    Review of Systems  Neurological:  Negative for headaches.       Objective:    Physical Exam  There were no vitals taken for this visit. Wt Readings from Last 3 Encounters:  07/23/21 (!) 334 lb (151.5 kg)  06/19/21 (!) 334 lb 12.8 oz (151.9 kg)  05/24/21 (!) 331 lb (150.1 kg)    Diabetic Foot Exam - Simple   No data filed    Lab Results  Component Value Date   WBC 5.3 08/27/2021   HGB 12.7 08/27/2021   HCT 40.1 08/27/2021   PLT 193.0 08/27/2021   GLUCOSE 135 (H) 08/27/2021    CHOL 188 08/27/2021   TRIG 91.0 08/27/2021   HDL 44.10 08/27/2021   LDLCALC 125 (H) 08/27/2021   ALT 10 08/27/2021   AST 18 08/27/2021   NA 135 08/27/2021   K 4.0 08/27/2021   CL 100 08/27/2021   CREATININE 0.98 08/27/2021   BUN 15 08/27/2021   CO2 31 08/27/2021   TSH 3.03 04/09/2021   HGBA1C 7.6 (H) 08/27/2021    Lab Results  Component Value Date  TSH 3.03 04/09/2021   Lab Results  Component Value Date   WBC 5.3 08/27/2021   HGB 12.7 08/27/2021   HCT 40.1 08/27/2021   MCV 75.1 (L) 08/27/2021   PLT 193.0 08/27/2021   Lab Results  Component Value Date   NA 135 08/27/2021   K 4.0 08/27/2021   CO2 31 08/27/2021   GLUCOSE 135 (H) 08/27/2021   BUN 15 08/27/2021   CREATININE 0.98 08/27/2021   BILITOT 1.0 08/27/2021   ALKPHOS 85 08/27/2021   AST 18 08/27/2021   ALT 10 08/27/2021   PROT 7.2 08/27/2021   ALBUMIN 3.8 08/27/2021   CALCIUM 9.3 08/27/2021   ANIONGAP 8 11/03/2020   GFR 65.00 08/27/2021   Lab Results  Component Value Date   CHOL 188 08/27/2021   Lab Results  Component Value Date   HDL 44.10 08/27/2021   Lab Results  Component Value Date   LDLCALC 125 (H) 08/27/2021   Lab Results  Component Value Date   TRIG 91.0 08/27/2021   Lab Results  Component Value Date   CHOLHDL 4 08/27/2021   Lab Results  Component Value Date   HGBA1C 7.6 (H) 08/27/2021       Assessment & Plan:   Problem List Items Addressed This Visit     Hyperlipidemia    Encourage heart healthy diet such as MIND or DASH diet, increase exercise, avoid trans fats, simple carbohydrates and processed foods, consider a krill or fish or flaxseed oil cap daily. Tolerating statin      Relevant Medications   spironolactone (ALDACTONE) 25 MG tablet   metoprolol succinate (TOPROL-XL) 25 MG 24 hr tablet   Hyperglycemia    hgba1c acceptable, minimize simple carbs. Increase exercise as tolerated.       Vitamin D deficiency    Supplement and monitor      Obesity    Encouraged  DASH or MIND diet, decrease po intake and increase exercise as tolerated. Needs 7-8 hours of sleep nightly. Avoid trans fats, eat small, frequent meals every 4-5 hours with lean proteins, complex carbs and healthy fats. Minimize simple carbs, high fat foods and processed foods      Essential hypertension, benign    Monitor and report any concerns, no changes to meds. Encouraged heart healthy diet such as the DASH diet and exercise as tolerated.       Relevant Medications   spironolactone (ALDACTONE) 25 MG tablet   metoprolol succinate (TOPROL-XL) 25 MG 24 hr tablet     Meds ordered this encounter  Medications   spironolactone (ALDACTONE) 25 MG tablet    Sig: Take 1 tablet (25 mg total) by mouth 3 (three) times daily.    Dispense:  90 tablet    Refill:  3   metoprolol succinate (TOPROL-XL) 25 MG 24 hr tablet    Sig: Take 1 tablet (25 mg total) by mouth daily.    Dispense:  90 tablet    Refill:  1    I discussed the assessment and treatment plan with the patient. The patient was provided an opportunity to ask questions and all were answered. The patient agreed with the plan and demonstrated an understanding of the instructions.   The patient was advised to call back or seek an in-person evaluation if the symptoms worsen or if the condition fails to improve as anticipated.  I provided 20 minutes of face-to-face time during this encounter.  I,Mohammed Iqbal,acting as a scribe for Penni Homans, MD.,have documented all relevant documentation on  the behalf of Penni Homans, MD,as directed by  Penni Homans, MD while in the presence of Penni Homans, MD.   Penni Homans, MD [facility] [phone] (phone) [phone] (fax)  [place]

## 2021-09-26 NOTE — Patient Instructions (Signed)
Magnesium Glycinate 200-400 mg at bed

## 2021-09-30 NOTE — Assessment & Plan Note (Signed)
Encourage heart healthy diet such as MIND or DASH diet, increase exercise, avoid trans fats, simple carbohydrates and processed foods, consider a krill or fish or flaxseed oil cap daily. Tolerating statin 

## 2021-09-30 NOTE — Assessment & Plan Note (Signed)
>>  ASSESSMENT AND PLAN FOR HYPERGLYCEMIA WRITTEN ON 09/30/2021 12:20 PM BY BLYTH, STACEY A, MD  hgba1c acceptable, minimize simple carbs. Increase exercise as tolerated.

## 2021-09-30 NOTE — Assessment & Plan Note (Addendum)
Monitor and report any concerns, no changes to meds. Encouraged heart healthy diet such as the DASH diet and exercise as tolerated.  ?

## 2021-09-30 NOTE — Assessment & Plan Note (Signed)
Encouraged DASH or MIND diet, decrease po intake and increase exercise as tolerated. Needs 7-8 hours of sleep nightly. Avoid trans fats, eat small, frequent meals every 4-5 hours with lean proteins, complex carbs and healthy fats. Minimize simple carbs, high fat foods and processed foods 

## 2021-09-30 NOTE — Assessment & Plan Note (Signed)
Supplement and monitor 

## 2021-09-30 NOTE — Assessment & Plan Note (Signed)
hgba1c acceptable, minimize simple carbs. Increase exercise as tolerated.  

## 2021-10-04 ENCOUNTER — Other Ambulatory Visit: Payer: Self-pay | Admitting: Family Medicine

## 2021-10-04 MED ORDER — WEGOVY 1 MG/0.5ML ~~LOC~~ SOAJ: 1.0000 mg | SUBCUTANEOUS | 0 refills | Status: DC

## 2021-10-21 DIAGNOSIS — L281 Prurigo nodularis: Secondary | ICD-10-CM | POA: Diagnosis not present

## 2021-10-22 DIAGNOSIS — Z1211 Encounter for screening for malignant neoplasm of colon: Secondary | ICD-10-CM | POA: Diagnosis not present

## 2021-10-22 DIAGNOSIS — Z0289 Encounter for other administrative examinations: Secondary | ICD-10-CM

## 2021-10-28 ENCOUNTER — Other Ambulatory Visit: Payer: Self-pay | Admitting: Family Medicine

## 2021-10-28 MED ORDER — WEGOVY 1.7 MG/0.75ML ~~LOC~~ SOAJ: 1.7000 mg | SUBCUTANEOUS | 0 refills | Status: DC

## 2021-11-02 LAB — COLOGUARD: COLOGUARD: NEGATIVE

## 2021-11-08 ENCOUNTER — Other Ambulatory Visit: Payer: Self-pay | Admitting: Family Medicine

## 2021-11-08 ENCOUNTER — Encounter: Payer: Self-pay | Admitting: Family Medicine

## 2021-11-08 MED ORDER — MOLNUPIRAVIR EUA 200MG CAPSULE: 4.0000 | ORAL_CAPSULE | Freq: Two times a day (BID) | ORAL | 0 refills | Status: AC

## 2021-11-12 ENCOUNTER — Ambulatory Visit: Payer: Self-pay | Admitting: Bariatrics

## 2021-11-15 ENCOUNTER — Other Ambulatory Visit: Payer: Self-pay | Admitting: Family Medicine

## 2021-11-15 MED ORDER — WEGOVY 2.4 MG/0.75ML ~~LOC~~ SOAJ: 2.4000 mg | SUBCUTANEOUS | 3 refills | Status: DC

## 2021-11-18 ENCOUNTER — Ambulatory Visit (INDEPENDENT_AMBULATORY_CARE_PROVIDER_SITE_OTHER): Payer: BC Managed Care – PPO | Admitting: Bariatrics

## 2021-11-18 ENCOUNTER — Encounter: Payer: Self-pay | Admitting: Bariatrics

## 2021-11-18 VITALS — BP 162/94 | HR 77 | Temp 97.6°F | Ht 67.0 in | Wt 333.0 lb

## 2021-11-18 DIAGNOSIS — K760 Fatty (change of) liver, not elsewhere classified: Secondary | ICD-10-CM

## 2021-11-18 DIAGNOSIS — E785 Hyperlipidemia, unspecified: Secondary | ICD-10-CM | POA: Diagnosis not present

## 2021-11-18 DIAGNOSIS — E669 Obesity, unspecified: Secondary | ICD-10-CM | POA: Insufficient documentation

## 2021-11-18 DIAGNOSIS — E559 Vitamin D deficiency, unspecified: Secondary | ICD-10-CM | POA: Diagnosis not present

## 2021-11-18 DIAGNOSIS — E1169 Type 2 diabetes mellitus with other specified complication: Secondary | ICD-10-CM | POA: Insufficient documentation

## 2021-11-18 DIAGNOSIS — E7849 Other hyperlipidemia: Secondary | ICD-10-CM | POA: Diagnosis not present

## 2021-11-18 DIAGNOSIS — Z0000 Encounter for general adult medical examination without abnormal findings: Secondary | ICD-10-CM

## 2021-11-18 DIAGNOSIS — E538 Deficiency of other specified B group vitamins: Secondary | ICD-10-CM | POA: Diagnosis not present

## 2021-11-18 DIAGNOSIS — Z6841 Body Mass Index (BMI) 40.0 and over, adult: Secondary | ICD-10-CM | POA: Insufficient documentation

## 2021-11-18 DIAGNOSIS — I10 Essential (primary) hypertension: Secondary | ICD-10-CM | POA: Diagnosis not present

## 2021-11-18 DIAGNOSIS — Z1331 Encounter for screening for depression: Secondary | ICD-10-CM | POA: Diagnosis not present

## 2021-11-18 DIAGNOSIS — R0602 Shortness of breath: Secondary | ICD-10-CM

## 2021-11-18 DIAGNOSIS — R5383 Other fatigue: Secondary | ICD-10-CM

## 2021-11-18 NOTE — Progress Notes (Signed)
Fibrosis 4 Score = 1.62 (Indeterminate)       Interpretation for patients with NAFLD          <1.30       -  F0-F1 (Low risk)          1.30-2.67 -  Indeterminate           >2.67      -  F3-F4 (High risk)     Validated for ages 48-65

## 2021-11-20 ENCOUNTER — Encounter (INDEPENDENT_AMBULATORY_CARE_PROVIDER_SITE_OTHER): Payer: Self-pay | Admitting: Bariatrics

## 2021-11-20 DIAGNOSIS — R768 Other specified abnormal immunological findings in serum: Secondary | ICD-10-CM | POA: Insufficient documentation

## 2021-11-20 DIAGNOSIS — G4733 Obstructive sleep apnea (adult) (pediatric): Secondary | ICD-10-CM | POA: Insufficient documentation

## 2021-11-20 LAB — MICROALBUMIN / CREATININE URINE RATIO
Creatinine, Urine: 148.9 mg/dL
Microalb/Creat Ratio: 2 mg/g creat (ref 0–29)
Microalbumin, Urine: 3.3 ug/mL

## 2021-11-20 LAB — COMPREHENSIVE METABOLIC PANEL
ALT: 10 IU/L (ref 0–32)
AST: 17 IU/L (ref 0–40)
Albumin/Globulin Ratio: 1.2 (ref 1.2–2.2)
Albumin: 3.9 g/dL (ref 3.8–4.9)
Alkaline Phosphatase: 95 IU/L (ref 44–121)
BUN/Creatinine Ratio: 14 (ref 9–23)
BUN: 15 mg/dL (ref 6–24)
Bilirubin Total: 0.6 mg/dL (ref 0.0–1.2)
CO2: 25 mmol/L (ref 20–29)
Calcium: 9.7 mg/dL (ref 8.7–10.2)
Chloride: 101 mmol/L (ref 96–106)
Creatinine, Ser: 1.06 mg/dL — ABNORMAL HIGH (ref 0.57–1.00)
Globulin, Total: 3.3 g/dL (ref 1.5–4.5)
Glucose: 100 mg/dL — ABNORMAL HIGH (ref 70–99)
Potassium: 4.6 mmol/L (ref 3.5–5.2)
Sodium: 141 mmol/L (ref 134–144)
Total Protein: 7.2 g/dL (ref 6.0–8.5)
eGFR: 62 mL/min/{1.73_m2} (ref >59)

## 2021-11-20 LAB — LIPID PANEL WITH LDL/HDL RATIO
Cholesterol, Total: 192 mg/dL (ref 100–199)
HDL: 41 mg/dL (ref >39)
LDL Chol Calc (NIH): 129 mg/dL — ABNORMAL HIGH (ref 0–99)
LDL/HDL Ratio: 3.1 ratio (ref 0.0–3.2)
Triglycerides: 123 mg/dL (ref 0–149)
VLDL Cholesterol Cal: 22 mg/dL (ref 5–40)

## 2021-11-20 LAB — TSH+T4F+T3FREE
Free T4: 0.94 ng/dL (ref 0.82–1.77)
T3, Free: 2.8 pg/mL (ref 2.0–4.4)
TSH: 1.48 u[IU]/mL (ref 0.450–4.500)

## 2021-11-20 LAB — HEMOGLOBIN A1C
Est. average glucose Bld gHb Est-mCnc: 151 mg/dL
Hgb A1c MFr Bld: 6.9 % — ABNORMAL HIGH (ref 4.8–5.6)

## 2021-11-20 LAB — VITAMIN B12: Vitamin B-12: 688 pg/mL (ref 232–1245)

## 2021-11-20 LAB — INSULIN, RANDOM: INSULIN: 38.8 u[IU]/mL — ABNORMAL HIGH (ref 2.6–24.9)

## 2021-11-20 LAB — VITAMIN D 25 HYDROXY (VIT D DEFICIENCY, FRACTURES): Vit D, 25-Hydroxy: 26.4 ng/mL — ABNORMAL LOW (ref 30.0–100.0)

## 2021-11-21 ENCOUNTER — Telehealth: Payer: Self-pay | Admitting: *Deleted

## 2021-11-21 MED ORDER — VITAMIN D3 25 MCG (1000 UT) PO CAPS: 2000.0000 [IU] | ORAL_CAPSULE | Freq: Every day | ORAL | Status: DC

## 2021-11-21 MED ORDER — VITAMIN D (ERGOCALCIFEROL) 1.25 MG (50000 UNIT) PO CAPS: 50000.0000 [IU] | ORAL_CAPSULE | ORAL | 3 refills | Status: DC

## 2021-11-21 NOTE — Telephone Encounter (Signed)
See result note.  

## 2021-11-25 NOTE — Progress Notes (Unsigned)
Chief Complaint:   OBESITY Jennifer Rich (MR# [id number]) is a 56 y.o. female who presents for evaluation and treatment of obesity and related comorbidities. Current BMI is Body mass index is 52.16 kg/m. Jennifer Rich has been struggling with her weight for many years and has been unsuccessful in either losing weight, maintaining weight loss, or reaching her healthy weight goal.  Jennifer Rich states that she likes to cook, but she notes time as an obstacle. She is taking Wegovy.  Jennifer Rich is currently in the action stage of change and ready to dedicate time achieving and maintaining a healthier weight. Jennifer Rich is interested in becoming our patient and working on intensive lifestyle modifications including (but not limited to) diet and exercise for weight loss.  Jennifer Rich's habits were reviewed today and are as follows: {MWM WT HABITS:23461}.  Depression Screen Jennifer Rich's Food and Mood (modified PHQ-9) score was 18.     11/18/2021    9:57 AM  Depression screen PHQ 2/9  Decreased Interest 3  Down, Depressed, Hopeless 2  PHQ - 2 Score 5  Altered sleeping 3  Tired, decreased energy 2  Change in appetite 2  Feeling bad or failure about yourself  2  Trouble concentrating 2  Moving slowly or fidgety/restless 2  Suicidal thoughts 0  PHQ-9 Score 18  Difficult doing work/chores Somewhat difficult   Subjective:   1. Other fatigue Jennifer Rich admits to daytime somnolence and admits to waking up still tired. Patient has a history of symptoms of daytime fatigue and morning fatigue. Jennifer Rich generally gets 5 hours of sleep per night, and states that she has nightime awakenings. Snoring is present. Apneic episodes are present. Epworth Sleepiness Score is 12.   2. SOB (shortness of breath) on exertion Jennifer Rich notes increasing shortness of breath with exercising and seems to be worsening over time with weight gain. She notes getting out of breath sooner with activity than she used  to. This has not gotten worse recently. Jennifer Rich denies shortness of breath at rest or orthopnea.  3. Other hyperlipidemia ***  4. Essential hypertension, benign ***  5. Vitamin D deficiency ***  6. Health care maintenance ***  7. Fatty liver ***  8. Diabetes mellitus type 2 in obese (HCC) ***  9. Vitamin B 12 deficiency *** Assessment/Plan:   1. Other fatigue Jennifer Rich does feel that her weight is causing her energy to be lower than it should be. Fatigue may be related to obesity, depression or many other causes. Labs will be ordered, and in the meanwhile, Jennifer Rich will focus on self care including making healthy food choices, increasing physical activity and focusing on stress reduction.  - EKG 12-Lead - TSH+T4F+T3Free  2. SOB (shortness of breath) on exertion Jennifer Rich does feel that she gets out of breath more easily that she used to when she exercises. Jennifer Rich's shortness of breath appears to be obesity related and exercise induced. She has agreed to work on weight loss and gradually increase exercise to treat her exercise induced shortness of breath. Will continue to monitor closely.  - TSH+T4F+T3Free  3. Other hyperlipidemia *** - Comprehensive metabolic panel - Lipid Panel With LDL/HDL Ratio  4. Essential hypertension, benign *** - Comprehensive metabolic panel  5. Vitamin D deficiency *** - VITAMIN D 25 Hydroxy (Vit-D Deficiency, Fractures)  6. Health care maintenance ***  7. Fatty liver ***  8. Diabetes mellitus type 2 in obese (HCC) *** - Comprehensive metabolic panel - Microalbumin / creatinine urine ratio - Insulin, random -  Hemoglobin A1c  9. Vitamin B 12 deficiency *** - Vitamin B12  10. Depression screening Jennifer Rich had a positive depression screening. Depression is commonly associated with obesity and often results in emotional eating behaviors. We will monitor this closely and work on CBT to help improve the non-hunger eating  patterns. Referral to Psychology may be required if no improvement is seen as she continues in our clinic.  11. Obesity, Current BMI 52.2 Jennifer Rich is currently in the action stage of change and her goal is to continue with weight loss efforts. I recommend Jennifer Rich begin the structured treatment plan as follows:  She has agreed to the Category 3 Plan.  Meal planning was discussed. Reviewed labs with the patient from 08/27/2021, CMP, lipids, Vit D, CBC, A1c, glucose. Minimize meal skipping.   May consider switching from [facility] to [facility].   Exercise goals: Walking the dog.    Behavioral modification strategies: increasing lean protein intake, decreasing simple carbohydrates, increasing vegetables, increasing water intake, decreasing eating out, no skipping meals, meal planning and cooking strategies, keeping healthy foods in the home, and planning for success.  She was informed of the importance of frequent follow-up visits to maximize her success with intensive lifestyle modifications for her multiple health conditions. She was informed we would discuss her lab results at her next visit unless there is a critical issue that needs to be addressed sooner. Jennifer Rich agreed to keep her next visit at the agreed upon time to discuss these results.  Objective:   Blood pressure (!) 162/94, pulse 77, temperature 97.6 F (36.4 C), height '5\' 7"'$  (1.702 m), weight (!) 333 lb (151 kg), SpO2 96 %. Body mass index is 52.16 kg/m.  EKG: Normal sinus rhythm, rate 85 BPM.  Indirect Calorimeter completed today shows a VO2 of 278 and a REE of 1915.  Her calculated basal metabolic rate is 3220 thus her basal metabolic rate is worse than expected.  General: Cooperative, alert, well developed, in no acute distress. HEENT: Conjunctivae and lids unremarkable. Cardiovascular: Regular rhythm.  Lungs: Normal work of breathing. Neurologic: No focal deficits.   Lab Results  Component Value Date   CREATININE 1.06  (H) 11/18/2021   BUN 15 11/18/2021   NA 141 11/18/2021   K 4.6 11/18/2021   CL 101 11/18/2021   CO2 25 11/18/2021   Lab Results  Component Value Date   ALT 10 11/18/2021   AST 17 11/18/2021   ALKPHOS 95 11/18/2021   BILITOT 0.6 11/18/2021   Lab Results  Component Value Date   HGBA1C 6.9 (H) 11/18/2021   HGBA1C 7.6 (H) 08/27/2021   HGBA1C 7.1 (H) 04/09/2021   HGBA1C 6.2 01/13/2017   HGBA1C 6.5 06/11/2015   Lab Results  Component Value Date   INSULIN 38.8 (H) 11/18/2021   Lab Results  Component Value Date   TSH 1.480 11/18/2021   Lab Results  Component Value Date   CHOL 192 11/18/2021   HDL 41 11/18/2021   LDLCALC 129 (H) 11/18/2021   TRIG 123 11/18/2021   CHOLHDL 4 08/27/2021   Lab Results  Component Value Date   WBC 5.3 08/27/2021   HGB 12.7 08/27/2021   HCT 40.1 08/27/2021   MCV 75.1 (L) 08/27/2021   PLT 193.0 08/27/2021   No results found for: "IRON", "TIBC", "FERRITIN"  Attestation Statements:   Reviewed by clinician on day of visit: allergies, medications, problem list, medical history, surgical history, family history, social history, and previous encounter notes.   Wilhemena Durie, am  acting as Location manager for [employer], DO.  I have reviewed the above documentation for accuracy and completeness, and I agree with the above. - ***

## 2021-11-26 ENCOUNTER — Ambulatory Visit: Payer: Self-pay | Admitting: Bariatrics

## 2021-12-02 ENCOUNTER — Ambulatory Visit: Payer: BC Managed Care – PPO | Admitting: Bariatrics

## 2021-12-09 ENCOUNTER — Ambulatory Visit: Payer: BC Managed Care – PPO | Admitting: Bariatrics

## 2021-12-10 ENCOUNTER — Other Ambulatory Visit: Payer: Self-pay | Admitting: Family

## 2021-12-10 ENCOUNTER — Ambulatory Visit: Payer: BC Managed Care – PPO | Admitting: Bariatrics

## 2021-12-13 ENCOUNTER — Ambulatory Visit: Payer: BC Managed Care – PPO | Admitting: Physician Assistant

## 2021-12-16 ENCOUNTER — Ambulatory Visit: Payer: BC Managed Care – PPO | Admitting: Nurse Practitioner

## 2021-12-29 ENCOUNTER — Encounter: Payer: Self-pay | Admitting: Family Medicine

## 2022-01-07 ENCOUNTER — Telehealth: Payer: BC Managed Care – PPO | Admitting: Nurse Practitioner

## 2022-01-07 ENCOUNTER — Other Ambulatory Visit: Payer: Self-pay

## 2022-01-07 ENCOUNTER — Encounter (HOSPITAL_BASED_OUTPATIENT_CLINIC_OR_DEPARTMENT_OTHER): Payer: Self-pay | Admitting: Emergency Medicine

## 2022-01-07 ENCOUNTER — Emergency Department (HOSPITAL_BASED_OUTPATIENT_CLINIC_OR_DEPARTMENT_OTHER)
Admission: EM | Admit: 2022-01-07 | Discharge: 2022-01-08 | Disposition: A | Discharge status: Home / Self Care | Payer: BC Managed Care – PPO | Attending: Emergency Medicine | Admitting: Emergency Medicine

## 2022-01-07 DIAGNOSIS — J40 Bronchitis, not specified as acute or chronic: Secondary | ICD-10-CM | POA: Diagnosis not present

## 2022-01-07 DIAGNOSIS — B974 Respiratory syncytial virus as the cause of diseases classified elsewhere: Secondary | ICD-10-CM | POA: Insufficient documentation

## 2022-01-07 DIAGNOSIS — Z79899 Other long term (current) drug therapy: Secondary | ICD-10-CM | POA: Insufficient documentation

## 2022-01-07 DIAGNOSIS — B338 Other specified viral diseases: Secondary | ICD-10-CM

## 2022-01-07 DIAGNOSIS — J101 Influenza due to other identified influenza virus with other respiratory manifestations: Secondary | ICD-10-CM | POA: Insufficient documentation

## 2022-01-07 DIAGNOSIS — B349 Viral infection, unspecified: Secondary | ICD-10-CM | POA: Diagnosis not present

## 2022-01-07 DIAGNOSIS — R059 Cough, unspecified: Secondary | ICD-10-CM | POA: Diagnosis not present

## 2022-01-07 DIAGNOSIS — I10 Essential (primary) hypertension: Secondary | ICD-10-CM | POA: Diagnosis not present

## 2022-01-07 DIAGNOSIS — Z1152 Encounter for screening for COVID-19: Secondary | ICD-10-CM | POA: Insufficient documentation

## 2022-01-07 LAB — RESP PANEL BY RT-PCR (RSV, FLU A&B, COVID)  RVPGX2
Influenza A by PCR: NEGATIVE
Influenza B by PCR: NEGATIVE
Resp Syncytial Virus by PCR: POSITIVE — AB
SARS Coronavirus 2 by RT PCR: NEGATIVE

## 2022-01-07 LAB — RESP PANEL BY RT-PCR (FLU A&B, COVID) ARPGX2
Influenza A by PCR: NEGATIVE
Influenza B by PCR: NEGATIVE
SARS Coronavirus 2 by RT PCR: NEGATIVE

## 2022-01-07 MED ORDER — PREDNISONE 20 MG PO TABS: 20.0000 mg | ORAL_TABLET | Freq: Two times a day (BID) | ORAL | 0 refills | Status: AC

## 2022-01-07 MED ORDER — ALBUTEROL SULFATE HFA 108 (90 BASE) MCG/ACT IN AERS: 2.0000 | INHALATION_SPRAY | Freq: Four times a day (QID) | RESPIRATORY_TRACT | 0 refills | Status: DC | PRN

## 2022-01-07 NOTE — ED Triage Notes (Signed)
Pt reports headache that started after taking prednisone and albuterol inhaler this morning. Pt was prescribed those medications today due to cough. Pt was dx with acute bronchitis by PCP. Headache is posterior and throbbing. Denies blood thinners. Pain unrelieved with tylenol. Reports cough has been going on for a few days. Grandson recently sick with similar sx.

## 2022-01-07 NOTE — Progress Notes (Signed)
Virtual Visit Consent   [name], you are scheduled for a virtual visit with a [facility] provider today. Just as with appointments in the office, your consent must be obtained to participate. Your consent will be active for this visit and any virtual visit you may have with one of our providers in the next 365 days. If you have a MyChart account, a copy of this consent can be sent to you electronically.  As this is a virtual visit, video technology does not allow for your provider to perform a traditional examination. This may limit your provider's ability to fully assess your condition. If your provider identifies any concerns that need to be evaluated in person or the need to arrange testing (such as labs, EKG, etc.), we will make arrangements to do so. Although advances in technology are sophisticated, we cannot ensure that it will always work on either your end or our end. If the connection with a video visit is poor, the visit may have to be switched to a telephone visit. With either a video or telephone visit, we are not always able to ensure that we have a secure connection.  By engaging in this virtual visit, you consent to the provision of healthcare and authorize for your insurance to be billed (if applicable) for the services provided during this visit. Depending on your insurance coverage, you may receive a charge related to this service.  I need to obtain your verbal consent now. Are you willing to proceed with your visit today? Jennifer Rich has provided verbal consent on 01/07/2022 for a virtual visit (video or telephone). [name], FNP  Date: 01/07/2022 7:46 AM  Virtual Visit via Video Note   I, [name], connected with  Jennifer Rich  ([id number], 55-27-1967) on 01/07/22 at  7:45 AM EST by a video-enabled telemedicine application and verified that I am speaking with the correct person using two identifiers.  Location: Patient: Virtual Visit Location  Patient: Home Provider: Virtual Visit Location Provider: Home Office   I discussed the limitations of evaluation and management by telemedicine and the availability of in person appointments. The patient expressed understanding and agreed to proceed.    History of Present Illness: Jennifer Rich is a 56 y.o. who identifies as a female who was assigned female at birth, and is being seen today with complaints of a cough that has worsened over the past 55 days. She also has been wheezing. Denies fever, body aches or other systemic symptoms.  Her cough is productive The cough does hurt as well.   She denies a history of asthma or COPD Does get bronchitis about once a year that requires inhaler use  She has had seasonal allergies and cold weather irritants.   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 ([place]) 07/23/2021   Syphilis 06/19/2021   Parotid mass 05/24/2021  Parotitis 05/15/2021   Tinnitus of right ear 05/15/2021   Fatty liver 04/15/2021   Hx of hepatitis C 04/15/2021   Essential hypertension, benign 11/02/2013   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   Obesity 04/26/2013   Anxiety and depression 01/30/2013   Cervical cancer screening 01/25/2013   Preventative health care 12/18/2012   Hyperlipidemia    Hyperglycemia    Vitamin D deficiency    Goiter     Allergies:  Allergies  Allergen Reactions   Lisinopril  Cough   Medications:  Current Outpatient Medications:    albuterol (VENTOLIN HFA) 108 (90 Base) MCG/ACT inhaler, Inhale 1-2 puffs into the lungs every 6 (six) hours as needed for wheezing or shortness of breath., Disp: 1 each, Rfl: 0   atorvastatin (LIPITOR) 10 MG tablet, Take 1 tablet (10 mg total) by mouth daily., Disp: 90 tablet, Rfl: 3   Cholecalciferol (VITAMIN D3) 25 MCG (1000 UT) CAPS, Take 2 capsules (2,000 Units total) by mouth daily., Disp: 60 capsule, Rfl:    losartan-hydrochlorothiazide (HYZAAR) 100-25 MG tablet, TAKE 1 TABLET BY MOUTH EVERY DAY, Disp: 30 tablet, Rfl: 5   metoprolol succinate (TOPROL-XL) 25 MG 24 hr tablet, Take 1 tablet (25 mg total) by mouth daily., Disp: 90 tablet, Rfl: 1   Semaglutide-Weight Management (WEGOVY) 2.4 MG/0.75ML SOAJ, Inject 2.4 mg into the skin once a week., Disp: 3 mL, Rfl: 3   spironolactone (ALDACTONE) 25 MG tablet, Take 1 tablet (25 mg total) by mouth 3 (three) times daily., Disp: 90 tablet, Rfl: 3   valACYclovir (VALTREX) 500 MG tablet, Take 500 mg by mouth as needed., Disp: , Rfl:    Vitamin D, Ergocalciferol, (DRISDOL) 1.25 MG (50000 UNIT) CAPS capsule, Take 1 capsule (50,000 Units total) by mouth every 7 (seven) days., Disp: 4 capsule, Rfl: 3  Observations/Objective: Patient is well-developed, well-nourished in no acute distress.  Resting comfortably  at home.  Head is normocephalic, atraumatic.  No labored breathing.  Speech is clear and coherent with logical content.  Patient is alert and oriented at baseline.    Assessment and Plan: 1. Bronchitis(viral) Advised OTC Mucinex in combination with:  - albuterol (VENTOLIN HFA) 108 (90 Base) MCG/ACT inhaler; Inhale 2 puffs into the lungs every 6 (six) hours as needed for wheezing or shortness of breath.  Dispense: 8 g; Refill: 0 - predniSONE (DELTASONE) 20 MG tablet; Take 1 tablet (20 mg total) by mouth 2 (two) times daily with a meal for 5 days.  Dispense: 10 tablet; Refill: 0    Watch  for fever or worsening symptoms as discussed that warrant follow up  Follow Up Instructions: I discussed the assessment and treatment plan with the patient. The patient was provided an opportunity to ask questions and all were answered. The patient agreed with the plan and demonstrated an understanding of the instructions.  A copy of instructions were sent to the patient via MyChart unless otherwise noted below.    The patient was advised to call back or seek an in-person evaluation if the symptoms worsen or if the condition fails to improve as anticipated.  Time:  I spent 10 minutes with the patient via telehealth technology discussing the above problems/concerns.    [name], FNP

## 2022-01-08 NOTE — ED Provider Notes (Signed)
[facility] EMERGENCY DEPARTMENT Provider Note   CSN: [id number] Arrival date & time: 01/07/22  2225     History  Chief Complaint  Patient presents with   Headache    Jennifer Rich is a 56 y.o. female.  Patient is a 56 year old female with past medical history of hyperlipidemia, hypertension.  Patient presenting today with complaints of headache, cough, and body aches for the past 2 days.  She denies any fevers or chills.  She does report exposure to her grandson who is also ill with similar complaints.  No aggravating or alleviating factors.  The history is provided by the patient.       Home Medications Prior to Admission medications   Medication Sig Start Date End Date Taking? Authorizing Provider  albuterol (VENTOLIN HFA) 108 (90 Base) MCG/ACT inhaler Inhale 1-2 puffs into the lungs every 6 (six) hours as needed for wheezing or shortness of breath. 01/22/21   Varney Biles, MD  albuterol (VENTOLIN HFA) 108 (90 Base) MCG/ACT inhaler Inhale 2 puffs into the lungs every 6 (six) hours as needed for wheezing or shortness of breath. 01/07/22   [name], FNP  atorvastatin (LIPITOR) 10 MG tablet Take 1 tablet (10 mg total) by mouth daily. 04/11/21   Mosie Lukes, MD  Cholecalciferol (VITAMIN D3) 25 MCG (1000 UT) CAPS Take 2 capsules (2,000 Units total) by mouth daily. 11/21/21   Mosie Lukes, MD  losartan-hydrochlorothiazide (HYZAAR) 100-25 MG tablet TAKE 1 TABLET BY MOUTH EVERY DAY 12/10/21   Mosie Lukes, MD  metoprolol succinate (TOPROL-XL) 25 MG 24 hr tablet Take 1 tablet (25 mg total) by mouth daily. 09/26/21   Mosie Lukes, MD  predniSONE (DELTASONE) 20 MG tablet Take 1 tablet (20 mg total) by mouth 2 (two) times daily with a meal for 5 days. 01/07/22 01/12/22  [name], FNP  Semaglutide-Weight Management (WEGOVY) 2.4 MG/0.75ML SOAJ Inject 2.4 mg into the skin once a week. 11/15/21   Mosie Lukes, MD  spironolactone (ALDACTONE) 25 MG tablet  Take 1 tablet (25 mg total) by mouth 3 (three) times daily. 09/26/21   Mosie Lukes, MD  valACYclovir (VALTREX) 500 MG tablet Take 500 mg by mouth as needed. 05/20/21   [provider]  Vitamin D, Ergocalciferol, (DRISDOL) 1.25 MG (50000 UNIT) CAPS capsule Take 1 capsule (50,000 Units total) by mouth every 7 (seven) days. 11/21/21   Mosie Lukes, MD      Allergies    Metoprolol and Lisinopril    Review of Systems   Review of Systems  All other systems reviewed and are negative.   Physical Exam Updated Vital Signs BP (!) 182/101 (BP Location: Left Arm)   Pulse 92   Temp 97.9 F (36.6 C) (Oral)   Resp 18   Ht '5\' 7"'$  (1.702 m)   Wt (!) 149.7 kg   SpO2 97%   BMI 51.69 kg/m  Physical Exam Vitals and nursing note reviewed.  Constitutional:      General: She is not in acute distress.    Appearance: She is well-developed. She is not diaphoretic.  HENT:     Head: Normocephalic and atraumatic.  Cardiovascular:     Rate and Rhythm: Normal rate and regular rhythm.     Heart sounds: No murmur heard.    No friction rub. No gallop.  Pulmonary:     Effort: Pulmonary effort is normal. No respiratory distress.     Breath sounds: Normal breath sounds. No  wheezing.  Abdominal:     General: Bowel sounds are normal. There is no distension.     Palpations: Abdomen is soft.     Tenderness: There is no abdominal tenderness.  Musculoskeletal:        General: Normal range of motion.     Cervical back: Normal range of motion and neck supple.  Skin:    General: Skin is warm and dry.  Neurological:     General: No focal deficit present.     Mental Status: She is alert and oriented to person, place, and time.     ED Results / Procedures / Treatments   Labs (all labs ordered are listed, but only abnormal results are displayed) Labs Reviewed  RESP PANEL BY RT-PCR (RSV, FLU A&B, COVID)  RVPGX2 - Abnormal; Notable for the following components:      Result Value   Resp Syncytial  Virus by PCR POSITIVE (*)    All other components within normal limits  RESP PANEL BY RT-PCR (FLU A&B, COVID) ARPGX2    EKG None  Radiology No results found.  Procedures Procedures    Medications Ordered in ED Medications - No data to display  ED Course/ Medical Decision Making/ A&P  Patient is a 56 year old female presenting with complaints of headache, body aches, and cough.  She has been exposed to her grandson who has been ill with similar complaints.  Patient arrives here with stable vital signs and is afebrile.  There is no hypoxia.  Physical examination is unremarkable.  Respiratory panel obtained showing negative results for COVID, flu, but did return positive for RSV.  I suspect that this is the cause of her symptoms.  Patient to be discharged with fluids, rotating Tylenol/Motrin, over-the-counter medications, and return as needed.  Final Clinical Impression(s) / ED Diagnoses Final diagnoses:  None    Rx / DC Orders ED Discharge Orders     None         Veryl Speak, MD 01/08/22 [id number]

## 2022-01-08 NOTE — Discharge Instructions (Signed)
Drink plenty of fluids and get plenty of rest.  Take Tylenol 1000 mg rotated with ibuprofen 600 mg every 3-4 hours as needed for pain or fever.  Return to the ER if symptoms significantly worsen or change.

## 2022-01-17 NOTE — Telephone Encounter (Signed)
Form completed and faxed back to [facility] at [phone]. Form sent for scanning.   Received fax confirmation.

## 2022-01-21 ENCOUNTER — Other Ambulatory Visit (HOSPITAL_BASED_OUTPATIENT_CLINIC_OR_DEPARTMENT_OTHER): Payer: Self-pay

## 2022-01-21 MED ORDER — FLUARIX QUADRIVALENT 0.5 ML IM SUSY
PREFILLED_SYRINGE | INTRAMUSCULAR | 0 refills | Status: DC
  Filled 2022-01-21: qty 0.5, 1d supply, fill #0

## 2022-01-21 MED ORDER — COMIRNATY 30 MCG/0.3ML IM SUSY
PREFILLED_SYRINGE | INTRAMUSCULAR | 0 refills | Status: DC
  Filled 2022-01-21: qty 0.3, 1d supply, fill #0

## 2022-01-24 ENCOUNTER — Other Ambulatory Visit: Payer: Self-pay | Admitting: Family Medicine

## 2022-01-29 NOTE — Telephone Encounter (Signed)
Patient states [facility] never received the form, and they need it today. She would like for the form to be emailed to her and then faxed again. Patient is requesting a call back. Please advise.

## 2022-01-30 NOTE — Telephone Encounter (Signed)
Called pt and faxed paperwork  Sent email copy to pt

## 2022-02-03 ENCOUNTER — Other Ambulatory Visit: Payer: Self-pay | Admitting: Nurse Practitioner

## 2022-02-03 DIAGNOSIS — J40 Bronchitis, not specified as acute or chronic: Secondary | ICD-10-CM

## 2022-02-05 ENCOUNTER — Telehealth: Payer: Self-pay | Admitting: Family Medicine

## 2022-02-05 ENCOUNTER — Encounter: Payer: Self-pay | Admitting: Family Medicine

## 2022-02-05 NOTE — Telephone Encounter (Signed)
Patient called to follow up on her mychart message. The form is missing an date so her employer rejected it. Patient states this is urgent and would like for the date to be added and resent in as soon as possible. Please see form attached to [employer] from today.

## 2022-02-05 NOTE — Telephone Encounter (Signed)
Called pt was advised forms was faxed

## 2022-03-23 ENCOUNTER — Other Ambulatory Visit: Payer: Self-pay | Admitting: Family Medicine

## 2022-04-03 ENCOUNTER — Encounter: Payer: BC Managed Care – PPO | Admitting: Family Medicine

## 2022-04-06 ENCOUNTER — Other Ambulatory Visit: Payer: Self-pay

## 2022-04-06 ENCOUNTER — Emergency Department (HOSPITAL_BASED_OUTPATIENT_CLINIC_OR_DEPARTMENT_OTHER)
Admission: EM | Admit: 2022-04-06 | Discharge: 2022-04-06 | Disposition: A | Discharge status: Home / Self Care | Payer: BC Managed Care – PPO | Attending: Emergency Medicine | Admitting: Emergency Medicine

## 2022-04-06 ENCOUNTER — Encounter (HOSPITAL_BASED_OUTPATIENT_CLINIC_OR_DEPARTMENT_OTHER): Payer: Self-pay | Admitting: *Deleted

## 2022-04-06 DIAGNOSIS — M5459 Other low back pain: Secondary | ICD-10-CM | POA: Diagnosis not present

## 2022-04-06 DIAGNOSIS — L299 Pruritus, unspecified: Secondary | ICD-10-CM | POA: Insufficient documentation

## 2022-04-06 DIAGNOSIS — R739 Hyperglycemia, unspecified: Secondary | ICD-10-CM | POA: Diagnosis not present

## 2022-04-06 DIAGNOSIS — M545 Low back pain, unspecified: Secondary | ICD-10-CM | POA: Diagnosis not present

## 2022-04-06 DIAGNOSIS — E1165 Type 2 diabetes mellitus with hyperglycemia: Secondary | ICD-10-CM | POA: Diagnosis not present

## 2022-04-06 LAB — URINALYSIS, ROUTINE W REFLEX MICROSCOPIC
Bilirubin Urine: NEGATIVE
Glucose, UA: NEGATIVE mg/dL
Hgb urine dipstick: NEGATIVE
Ketones, ur: NEGATIVE mg/dL
Leukocytes,Ua: NEGATIVE
Nitrite: NEGATIVE
Protein, ur: NEGATIVE mg/dL
Specific Gravity, Urine: 1.015 (ref 1.005–1.030)
pH: 5.5 (ref 5.0–8.0)

## 2022-04-06 LAB — COMPREHENSIVE METABOLIC PANEL
ALT: 16 U/L (ref 0–44)
AST: 28 U/L (ref 15–41)
Albumin: 3.7 g/dL (ref 3.5–5.0)
Alkaline Phosphatase: 86 U/L (ref 38–126)
Anion gap: 7 (ref 5–15)
BUN: 22 mg/dL — ABNORMAL HIGH (ref 6–20)
CO2: 25 mmol/L (ref 22–32)
Calcium: 8.7 mg/dL — ABNORMAL LOW (ref 8.9–10.3)
Chloride: 102 mmol/L (ref 98–111)
Creatinine, Ser: 1.27 mg/dL — ABNORMAL HIGH (ref 0.44–1.00)
GFR, Estimated: 50 mL/min — ABNORMAL LOW (ref >60)
Glucose, Bld: 190 mg/dL — ABNORMAL HIGH (ref 70–99)
Potassium: 3.7 mmol/L (ref 3.5–5.1)
Sodium: 134 mmol/L — ABNORMAL LOW (ref 135–145)
Total Bilirubin: 1.2 mg/dL (ref 0.3–1.2)
Total Protein: 8 g/dL (ref 6.5–8.1)

## 2022-04-06 LAB — CBC WITH DIFFERENTIAL/PLATELET
Abs Immature Granulocytes: 0.03 10*3/uL (ref 0.00–0.07)
Basophils Absolute: 0 10*3/uL (ref 0.0–0.1)
Basophils Relative: 1 %
Eosinophils Absolute: 0.1 10*3/uL (ref 0.0–0.5)
Eosinophils Relative: 1 %
HCT: 41.6 % (ref 36.0–46.0)
Hemoglobin: 13 g/dL (ref 12.0–15.0)
Immature Granulocytes: 1 %
Lymphocytes Relative: 37 %
Lymphs Abs: 2.1 10*3/uL (ref 0.7–4.0)
MCH: 24.8 pg — ABNORMAL LOW (ref 26.0–34.0)
MCHC: 31.3 g/dL (ref 30.0–36.0)
MCV: 79.4 fL — ABNORMAL LOW (ref 80.0–100.0)
Monocytes Absolute: 0.3 10*3/uL (ref 0.1–1.0)
Monocytes Relative: 6 %
Neutro Abs: 3.1 10*3/uL (ref 1.7–7.7)
Neutrophils Relative %: 54 %
Platelets: 210 10*3/uL (ref 150–400)
RBC: 5.24 MIL/uL — ABNORMAL HIGH (ref 3.87–5.11)
RDW: 14.6 % (ref 11.5–15.5)
WBC: 5.6 10*3/uL (ref 4.0–10.5)
nRBC: 0 % (ref 0.0–0.2)

## 2022-04-06 NOTE — ED Triage Notes (Signed)
Here for evaluation of left sided back pain, onset approx 3 days ago. Intermittent spasms

## 2022-04-06 NOTE — ED Notes (Signed)
Onset pain approx 4 days ago, no injuries, no falls, no heavy lifting, no fevers, no radiation of pain down leg. Describes pain as spasms intermittently

## 2022-04-06 NOTE — Discharge Instructions (Signed)
You were seen in the emergency department for some right-sided low back pain along with itching.  You had lab work and urinalysis that showed your blood sugar was elevated.  This will need to be followed up with your primary care doctor.  Please use Tylenol and ibuprofen for pain and you can also try topical Salonpas to the back area to see if this helps with your symptoms.  Return to the emergency department if any worsening or concerning symptoms.

## 2022-04-06 NOTE — ED Notes (Signed)
ED Provider at bedside. 

## 2022-04-06 NOTE — ED Provider Notes (Signed)
[place] [facility] [place] [facility] [facility] Provider Note   CSN: [id number] Arrival date & time: 04/06/22  3893     History  Chief Complaint  Patient presents with   Back Pain    Jennifer Rich is a 57 y.o. female.  She is here with a complaint of lower left back pain that started 3 days ago.  She does not recall any trauma.  Yesterday she took an ibuprofen.  Today she continues with the back pain but she also has some itching all over and noticed a little bit of a rash to her forearms.  No urinary symptoms.  No fevers.  No numbness or weakness.  No bowel or bladder incontinence.  The history is provided by the patient.  Back Pain Location:  Lumbar spine Quality:  Aching Radiates to:  Does not radiate Pain severity:  Moderate Pain is:  Same all the time Onset quality:  Gradual Duration:  3 days Progression:  Unchanged Chronicity:  New Relieved by:  Nothing Worsened by:  Nothing Ineffective treatments:  Ibuprofen Associated symptoms: no abdominal pain, no bladder incontinence, no bowel incontinence, no chest pain, no dysuria, no fever, no numbness, no pelvic pain and no weakness   Risk factors: no hx of cancer        Home Medications Prior to Admission medications   Medication Sig Start Date End Date Taking? Authorizing Provider  albuterol (VENTOLIN HFA) 108 (90 Base) MCG/ACT inhaler Inhale 1-2 puffs into the lungs every 6 (six) hours as needed for wheezing or shortness of breath. 01/22/21   Varney Biles, MD  albuterol (VENTOLIN HFA) 108 (90 Base) MCG/ACT inhaler TAKE 2 PUFFS BY MOUTH EVERY 6 HOURS AS NEEDED FOR WHEEZE OR SHORTNESS OF BREATH 02/03/22   Mosie Lukes, MD  atorvastatin (LIPITOR) 10 MG tablet Take 1 tablet (10 mg total) by mouth daily. 04/11/21   Mosie Lukes, MD  Cholecalciferol (VITAMIN D3) 25 MCG (1000 UT) CAPS Take 2 capsules (2,000 Units total) by mouth daily. 11/21/21   Mosie Lukes, MD  COVID-19 mRNA vaccine [phone]  (COMIRNATY) syringe Inject into the muscle. 01/21/22   Carlyle Basques, MD  influenza vac split quadrivalent PF (FLUARIX QUADRIVALENT) 0.5 ML injection Inject into the muscle. 01/21/22   Carlyle Basques, MD  losartan-hydrochlorothiazide [facility]) 100-25 MG tablet TAKE 1 TABLET BY MOUTH EVERY DAY 12/10/21   Mosie Lukes, MD  metoprolol succinate (TOPROL-XL) 25 MG 24 hr tablet Take 1 tablet (25 mg total) by mouth daily. 09/26/21   Mosie Lukes, MD  Semaglutide-Weight Management (WEGOVY) 2.4 MG/0.75ML SOAJ Inject 2.4 mg into the skin once a week. 11/15/21   Mosie Lukes, MD  spironolactone (ALDACTONE) 25 MG tablet TAKE 1 TABLET BY MOUTH TWICE A DAY 03/24/22   Mosie Lukes, MD  valACYclovir (VALTREX) 500 MG tablet Take 500 mg by mouth as needed. 05/20/21   [provider]  Vitamin D, Ergocalciferol, (DRISDOL) 1.25 MG (50000 UNIT) CAPS capsule Take 1 capsule (50,000 Units total) by mouth every 7 (seven) days. 11/21/21   Mosie Lukes, MD      Allergies    Metoprolol and Lisinopril    Review of Systems   Review of Systems  Constitutional:  Negative for fever.  Cardiovascular:  Negative for chest pain.  Gastrointestinal:  Negative for abdominal pain and bowel incontinence.  Genitourinary:  Negative for bladder incontinence, dysuria and pelvic pain.  Musculoskeletal:  Positive for back pain.  Skin:  Positive for  rash.  Neurological:  Negative for weakness and numbness.    Physical Exam Updated Vital Signs BP (!) 161/111 (BP Location: Left Arm) Comment: Took meds for BP this am  Pulse 92   Temp 97.9 F (36.6 C) (Oral)   Resp 18   SpO2 99%  Physical Exam Vitals and nursing note reviewed.  Constitutional:      General: She is not in acute distress.    Appearance: Normal appearance. She is well-developed.  HENT:     Head: Normocephalic and atraumatic.  Eyes:     Conjunctiva/sclera: Conjunctivae normal.  Cardiovascular:     Rate and Rhythm: Normal rate and regular  rhythm.     Heart sounds: No murmur heard. Pulmonary:     Effort: Pulmonary effort is normal. No respiratory distress.     Breath sounds: Normal breath sounds.  Abdominal:     Palpations: Abdomen is soft.     Tenderness: There is no abdominal tenderness. There is no guarding or rebound.  Musculoskeletal:        General: No deformity.     Cervical back: Neck supple.  Skin:    General: Skin is warm and dry.     Capillary Refill: Capillary refill takes less than 2 seconds.     Findings: Rash present.     Comments: She has very faint rash on her forearms.  It is itchy.  Neurological:     General: No focal deficit present.     Mental Status: She is alert.     Sensory: No sensory deficit.     Motor: No weakness.     Gait: Gait normal.     ED Results / Procedures / Treatments   Labs (all labs ordered are listed, but only abnormal results are displayed) Labs Reviewed  COMPREHENSIVE METABOLIC PANEL - Abnormal; Notable for the following components:      Result Value   Sodium 134 (*)    Glucose, Bld 190 (*)    BUN 22 (*)    Creatinine, Ser 1.27 (*)    Calcium 8.7 (*)    GFR, Estimated 50 (*)    All other components within normal limits  CBC WITH DIFFERENTIAL/PLATELET - Abnormal; Notable for the following components:   RBC 5.24 (*)    MCV 79.4 (*)    MCH 24.8 (*)    All other components within normal limits  URINALYSIS, ROUTINE W REFLEX MICROSCOPIC    EKG None  Radiology No results found.  Procedures Procedures    Medications Ordered in ED Medications - No data to display  ED Course/ Medical Decision Making/ A&P Clinical Course as of 04/06/22 1700  Sun Apr 06, 2022  0944 Reviewed results of testing with patient.  She is not about her blood sugar being elevated and has an appointment with her PCP next week.  She said the itching has been an on-and-off problem and she is allergic to many foods so she is not very concerned about it.  Recommended Tylenol and ibuprofen  and PCP follow-up. [MB]    Clinical Course User Index [MB] Hayden Rasmussen, MD                             Medical Decision Making Amount and/or Complexity of Data Reviewed Labs: ordered.   This patient complains of low back pain generalized itching; this involves an extensive number of treatment Options and is a complaint that carries with it  a high risk of complications and morbidity. The differential includes musculoskeletal back pain, UTI, renal colic, infection, uremia, elevated LFTs  I ordered, reviewed and interpreted labs, which included CBC with normal white count normal hemoglobin, chemistries with Previous records obtained and reviewed in epic, blood sugars have been borderline for quite a while Cardiac monitoring reviewed, normal sinus rhythm Social determinants considered, no significant barriers Critical Interventions: None  After the interventions stated above, I reevaluated the patient and found patient be nontoxic-appearing in no distress Admission and further testing considered, no indications for further workup at this time.  Recommended to patient continue NSAIDs and follow-up with PCP.  Return instructions discussed.         Final Clinical Impression(s) / ED Diagnoses Final diagnoses:  Acute right-sided low back pain without sciatica  Itching  Hyperglycemia    Rx / DC Orders ED Discharge Orders     None         Hayden Rasmussen, MD 04/06/22 1702

## 2022-04-08 ENCOUNTER — Encounter: Payer: Self-pay | Admitting: Family Medicine

## 2022-04-08 ENCOUNTER — Ambulatory Visit (INDEPENDENT_AMBULATORY_CARE_PROVIDER_SITE_OTHER): Payer: BC Managed Care – PPO | Admitting: Family Medicine

## 2022-04-08 VITALS — BP 119/62 | HR 89 | Temp 97.8°F | Resp 16 | Ht 67.0 in | Wt 340.2 lb

## 2022-04-08 DIAGNOSIS — E669 Obesity, unspecified: Secondary | ICD-10-CM

## 2022-04-08 DIAGNOSIS — Z9189 Other specified personal risk factors, not elsewhere classified: Secondary | ICD-10-CM | POA: Diagnosis not present

## 2022-04-08 DIAGNOSIS — E1169 Type 2 diabetes mellitus with other specified complication: Secondary | ICD-10-CM | POA: Diagnosis not present

## 2022-04-08 DIAGNOSIS — F32A Depression, unspecified: Secondary | ICD-10-CM

## 2022-04-08 LAB — COMPREHENSIVE METABOLIC PANEL
ALT: 12 U/L (ref 0–35)
AST: 24 U/L (ref 0–37)
Albumin: 4.2 g/dL (ref 3.5–5.2)
Alkaline Phosphatase: 83 U/L (ref 39–117)
BUN: 23 mg/dL (ref 6–23)
CO2: 24 mEq/L (ref 19–32)
Calcium: 9.7 mg/dL (ref 8.4–10.5)
Chloride: 99 mEq/L (ref 96–112)
Creatinine, Ser: 1.21 mg/dL — ABNORMAL HIGH (ref 0.40–1.20)
GFR: 50.25 mL/min — ABNORMAL LOW (ref >60.00)
Glucose, Bld: 169 mg/dL — ABNORMAL HIGH (ref 70–99)
Potassium: 4 mEq/L (ref 3.5–5.1)
Sodium: 137 mEq/L (ref 135–145)
Total Bilirubin: 0.9 mg/dL (ref 0.2–1.2)
Total Protein: 7.8 g/dL (ref 6.0–8.3)

## 2022-04-08 LAB — HEMOGLOBIN A1C: Hgb A1c MFr Bld: 8.4 % — ABNORMAL HIGH (ref 4.6–6.5)

## 2022-04-08 NOTE — Progress Notes (Signed)
Acute Office Visit  Subjective:     Patient ID: Jennifer Rich, female    DOB: 1965/08/17, 57 y.o.   MRN: [id number]  Chief Complaint  Patient presents with   Follow-up    Glucose     HPI Patient is in today for ED follow-up.  Patient was in the [facility] ED on 04/06/22 with complaints of 3 days of lower back pain, generalized itching and rash to forearms. CBC stable. CMP with elevated BUN, creatinine and glucose and low sodium and calcium. UA was normal. She was encouraged to try tylenol and ibuprofen and follow-up with primary care.   Back pain is much beter, no longer a concern. She improved with heating pad use.   Her biggest concern today is the elevated glucose on labs at the ED. She does not check her glucose levels at home. Reports that she has previously been doing lifestyle management for T2DM and tried [facility] for a little while for obesity, but she did not like the nausea, so she stopped taking several months ago. She would is interested in trying a different injectable option if A1c is still elevated. She has been motivated to make some lifestyle changes. She is starting a low-carb diet and avoiding red meat and processed meats. She has started walking 20 minutes daily and is trying to gradually increase duration. She denies any symptoms of hypo/hyperglycemia.    Lab Results  Component Value Date   HGBA1C 6.9 (H) 11/18/2021     She is also concerned she may have sleep apnea. States she tosses and turns all night, feeling like she doesn't get quality sleep. She hasn't noticed any excessive daytime fatigue, but states that she just keeps herself busy to get everything done that needs to get done. Her husband has told her that she snores loudly.  STOP-BANG for SLEEP APNEA Do you Snore loudly? Yes Do you often feel Tired during day? No Has anyone Observed you stop breathing? No History of high blood Pressure? Yes BMI >35? Yes Age >50? Yes Neck  circumference >16 in? Yes Gender female? No 5-8 = high risk 3-4 = intermediate 0-2 = low risk      ROS All review of systems negative except what is listed in the HPI      Objective:    BP 119/62   Pulse 89   Temp 97.8 F (36.6 C)   Resp 16   Ht '5\' 7"'$  (1.702 m)   Wt (!) 340 lb 3.2 oz (154.3 kg)   SpO2 96%   BMI 53.28 kg/m    Physical Exam Vitals reviewed.  Constitutional:      Appearance: Normal appearance. She is obese.  HENT:     Head: Normocephalic and atraumatic.  Cardiovascular:     Rate and Rhythm: Normal rate and regular rhythm.     Pulses: Normal pulses.     Heart sounds: Normal heart sounds.  Pulmonary:     Effort: Pulmonary effort is normal.     Breath sounds: Normal breath sounds.  Musculoskeletal:     Cervical back: Normal range of motion and neck supple.  Skin:    General: Skin is warm and dry.  Neurological:     Mental Status: She is alert and oriented to person, place, and time.  Psychiatric:        Mood and Affect: Mood normal.        Behavior: Behavior normal.        Thought Content:  Thought content normal.        Judgment: Judgment normal.       No results found for any visits on 04/08/22.      Assessment & Plan:   Problem List Items Addressed This Visit       Endocrine   Diabetes mellitus type 2 in obese [facility]) Updating labs today.  Discussed healthy lifestyle choices She is interested in an injectable, but made sure to note that [facility] caused significant nausea - we discussed that she may respond the same way to the other medication, especially Ozempic. She is open to trying one of the other injectable options if A1c elevated.    Relevant Orders   HgB A1c   Comprehensive metabolic panel   Other Visit Diagnoses     At risk for sleep apnea    -  Primary Referral for sleep apnea evaluation.   Relevant Orders   Ambulatory referral to Pulmonology       No orders of the defined types were placed in this  encounter.   Return if symptoms worsen or fail to improve.  Terrilyn Saver, NP

## 2022-04-09 ENCOUNTER — Telehealth: Payer: Self-pay

## 2022-04-09 ENCOUNTER — Encounter: Payer: Self-pay | Admitting: Family Medicine

## 2022-04-09 MED ORDER — TIRZEPATIDE 2.5 MG/0.5ML ~~LOC~~ SOAJ: 2.5000 mg | SUBCUTANEOUS | 1 refills | Status: DC

## 2022-04-09 NOTE — Addendum Note (Signed)
Addended by: Caleen Jobs B on: 04/09/2022 09:19 AM   Modules accepted: Orders

## 2022-04-09 NOTE — Progress Notes (Signed)
Your A1c has jumped up. I will try to send in [facility] (once a week injection for diabetes).  Slight decrease in kidney function - may sure you are staying well hydrated. Recheck labs in about a month.   After you start the medication, schedule a one month follow-up to repeat labs and see how you are doing with the medication. Start checking a fasting glucose level each morning and keep a log for [place] to bring to next appointment. Sometimes at this level we go ahead and recommend 2 agents to treat diabetes, but if you plan to continue strict lifestyle changes, we can see how your numbers are looking after one month of Mounjaro.

## 2022-04-09 NOTE — Telephone Encounter (Signed)
PA initiated via Covermymeds; KEY:  [id number]. PA approved.   This request has been approved using information available on the patient's profile. [id number];Review Type:Prior Auth;Coverage Start Date:03/10/2022;Coverage End Date:04/09/2023;

## 2022-04-16 ENCOUNTER — Encounter: Payer: Self-pay | Admitting: Family Medicine

## 2022-04-16 ENCOUNTER — Ambulatory Visit (INDEPENDENT_AMBULATORY_CARE_PROVIDER_SITE_OTHER): Payer: BC Managed Care – PPO | Admitting: Medical

## 2022-04-16 VITALS — BP 150/80 | HR 87 | Resp 18 | Ht 67.0 in | Wt 336.0 lb

## 2022-04-16 DIAGNOSIS — H538 Other visual disturbances: Secondary | ICD-10-CM | POA: Diagnosis not present

## 2022-04-16 DIAGNOSIS — R739 Hyperglycemia, unspecified: Secondary | ICD-10-CM

## 2022-04-16 LAB — GLUCOSE, POCT (MANUAL RESULT ENTRY): POC Glucose: 92 mg/dl (ref 70–99)

## 2022-04-16 NOTE — Progress Notes (Signed)
Subjective:    Patient ID: Jennifer Rich, female    DOB: 11-22-1965, 57 y.o.   MRN: [id number]  HPI Pt states just since yesterday her vision was little blurred. She states she does wear glasses but she is not wearing them daily. She thought also maybe her sugar was high. Pt just ordered her glucometer. She states will get to her house tomorrow.   Pt states she used some high powered readers yesterday and when stopped thought vision was blurred Sunday night. Monday her vision gradually improved and she feels like vision improved  Pt has appt with optometrist May 24, 2022.  FS sugar 92  Last A1c was 8.4. Pt is now.  Pt feel like left is more blurred. She feel like almost normal now.      Review of Systems  Constitutional:  Negative for chills, fatigue and fever.  Eyes:  Positive for visual disturbance. Negative for photophobia and pain.       For about one day then much improved.  Respiratory:  Negative for cough, chest tightness, shortness of breath and wheezing.   Cardiovascular:  Negative for chest pain and palpitations.  Gastrointestinal:  Negative for abdominal pain.  Genitourinary:  Negative for dysuria.  Musculoskeletal:  Negative for back pain and myalgias.  Skin:  Negative for rash.  Neurological:  Negative for dizziness, seizures, weakness and light-headedness.  Hematological:  Negative for adenopathy. Does not bruise/bleed easily.  Psychiatric/Behavioral:  Negative for behavioral problems, decreased concentration, dysphoric mood and suicidal ideas. The patient is not nervous/anxious.     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   BLASTOCYSTIS HOMINIS     CAP (community acquired pneumonia) 04/17/2015   Cervical cancer screening 01/25/2013   Chicken pox as a child   Fatty liver    Goiter    Mildly enlarged thyroid TSH normal    Hepatitis C    was treated in 2022    Hyperglycemia    Hyperlipidemia    Hypertension    Obesity    Obesity, unspecified 04/26/2013   Pneumonia 05/02/2013   Preventative health care 12/18/2012   Has seen Dr Carlota Raspberry for GYN in past   Sleep apnea    Type II or unspecified type diabetes mellitus without mention of complication, not stated as uncontrolled    Vitamin D deficiency      Social History   Socioeconomic History   Marital status: Married    Spouse name: Not on file   Number of children: 0   Years of education: Not on file   Highest education level: Not on file  Occupational History   Occupation: Press photographer [employer]    Employer: [employer]  Tobacco Use   Smoking status: Never   Smokeless tobacco: Never  Vaping Use   Vaping Use: Never used  Substance and Sexual Activity   Alcohol use: No   Drug use: No   Sexual activity: Yes    Partners: Male    Comment: lives with husband and [place], [place], no dietary restrictions.  Other Topics Concern   Not on file  Social History Narrative   No Pork         Social Determinants of Health   Financial Resource Strain: Not on file  Food Insecurity: Not on file  Transportation Needs: Not on file  Physical Activity: Not on file  Stress: Not on file  Social Connections: Not  on file  Intimate Partner Violence: Not on file    Past Surgical History:  Procedure Laterality Date   WISDOM TOOTH EXTRACTION  57 yrs old    Family History  Problem Relation Age of Onset   High blood pressure Mother    High Cholesterol Mother    High blood pressure Father    Alcoholism Father    Hypertension Sister    Hypertension Sister    Diabetes Maternal Grandmother        type 2   Blindness Maternal Grandmother    Heart attack Maternal Grandfather 42   Heart disease Maternal Grandfather    Hypertension Paternal Grandmother    Stroke Paternal Grandfather    Colon cancer Neg Hx    Esophageal cancer Neg Hx    Stomach cancer Neg Hx     Allergies  Allergen Reactions    Metoprolol Other (See Comments)   Lisinopril Cough and Other (See Comments)    Current Outpatient Medications on File Prior to Visit  Medication Sig Dispense Refill   albuterol (VENTOLIN HFA) 108 (90 Base) MCG/ACT inhaler Inhale 1-2 puffs into the lungs every 6 (six) hours as needed for wheezing or shortness of breath. 1 each 0   albuterol (VENTOLIN HFA) 108 (90 Base) MCG/ACT inhaler TAKE 2 PUFFS BY MOUTH EVERY 6 HOURS AS NEEDED FOR WHEEZE OR SHORTNESS OF BREATH 18 each 1   atorvastatin (LIPITOR) 10 MG tablet Take 1 tablet (10 mg total) by mouth daily. 90 tablet 3   Cholecalciferol (VITAMIN D3) 25 MCG (1000 UT) CAPS Take 2 capsules (2,000 Units total) by mouth daily. 60 capsule    COVID-19 mRNA vaccine 2023-2024 (COMIRNATY) syringe Inject into the muscle. 0.3 mL 0   influenza vac split quadrivalent PF (FLUARIX QUADRIVALENT) 0.5 ML injection Inject into the muscle. 0.5 mL 0   losartan-hydrochlorothiazide (HYZAAR) 100-25 MG tablet TAKE 1 TABLET BY MOUTH EVERY DAY 30 tablet 5   metoprolol succinate (TOPROL-XL) 25 MG 24 hr tablet Take 1 tablet (25 mg total) by mouth daily. 90 tablet 1   spironolactone (ALDACTONE) 25 MG tablet TAKE 1 TABLET BY MOUTH TWICE A DAY 60 tablet 1   tirzepatide (MOUNJARO) 2.5 MG/0.5ML Pen Inject 2.5 mg into the skin once a week. 2 mL 1   valACYclovir (VALTREX) 500 MG tablet Take 500 mg by mouth as needed.     Vitamin D, Ergocalciferol, (DRISDOL) 1.25 MG (50000 UNIT) CAPS capsule Take 1 capsule (50,000 Units total) by mouth every 7 (seven) days. 4 capsule 3   No current facility-administered medications on file prior to visit.    BP (!) 150/80   Pulse 87   Resp 18   Ht 5' 7"$  (1.702 m)   Wt (!) 336 lb (152.4 kg)   SpO2 98%   BMI 52.63 kg/m        Objective:   Physical Exam  General Mental Status- Alert. General Appearance- Not in acute distress.   Skin General: Color- Normal Color. Moisture- Normal Moisture.  Neck Carotid Arteries- Normal color.  Moisture- Normal Moisture. No carotid bruits. No JVD.  Chest and Lung Exam Auscultation: Breath Sounds:-Normal.  Cardiovascular Auscultation:Rythm- Regular. Murmurs & Other Heart Sounds:Auscultation of the heart reveals- No Murmurs.  Abdomen Inspection:-Inspeection Normal. Palpation/Percussion:Note:No mass. Palpation and Percussion of the abdomen reveal- Non Tender, Non Distended + BS, no rebound or guarding.   Neurologic Cranial Nerve exam:- CN III-XII intact(No nystagmus), symmetric smile. Strength:- 5/5 equal and symmetric strength both upper and lower extremities.  Assessment & Plan:   Patient Instructions  Recent blurred vision that started on Sunday but now much improved and almost back to baseline.  Symptoms described in the left eye.  Nondilated eye exam today was normal. Your blood sugar was 92 today.  With your A1c being 8.4 recently it is possible that your sugar was elevated when patient was most blurred.  Recommend checking blood sugar daily but additional check if you have significant blurred vision.  Sugar levels are over 200.  Want you to go ahead and try that call your optometrist to see if they can see you tomorrow.  Please give me an update by 10:00 tomorrow morning.  If you cannot be seen by your optometrist then we will ask referral coordinator to try to get you in with another location.  If any recurrent severe/worsening vision changes then be seen in the emergency department.  Follow-up as regular scheduled with your PCP.   Mackie Pai, PA-C

## 2022-04-16 NOTE — Patient Instructions (Signed)
Recent blurred vision that started on Sunday but now much improved and almost back to baseline.  Symptoms described in the left eye.  Nondilated eye exam today was normal. Your blood sugar was 92 today.  With your A1c being 8.4 recently it is possible that your sugar was elevated when patient was most blurred.  Recommend checking blood sugar daily but additional check if you have significant blurred vision.  Sugar levels are over 200.  Want you to go ahead and try that call your optometrist to see if they can see you tomorrow.  Please give me an update by 10:00 tomorrow morning.  If you cannot be seen by your optometrist then we will ask referral coordinator to try to get you in with another location.  If any recurrent severe/worsening vision changes then be seen in the emergency department.  Follow-up as regular scheduled with your PCP.

## 2022-04-16 NOTE — Telephone Encounter (Signed)
Called pt and scheduled same day with Jennifer Rich

## 2022-04-17 ENCOUNTER — Ambulatory Visit (INDEPENDENT_AMBULATORY_CARE_PROVIDER_SITE_OTHER): Payer: BC Managed Care – PPO | Admitting: Adult Health

## 2022-04-17 ENCOUNTER — Encounter: Payer: Self-pay | Admitting: Family Medicine

## 2022-04-17 ENCOUNTER — Encounter: Payer: Self-pay | Admitting: Adult Health

## 2022-04-17 VITALS — BP 140/80 | HR 84 | Temp 97.7°F | Ht 67.0 in | Wt 335.6 lb

## 2022-04-17 DIAGNOSIS — Z6841 Body Mass Index (BMI) 40.0 and over, adult: Secondary | ICD-10-CM | POA: Diagnosis not present

## 2022-04-17 DIAGNOSIS — G4733 Obstructive sleep apnea (adult) (pediatric): Secondary | ICD-10-CM

## 2022-04-17 DIAGNOSIS — H35361 Drusen (degenerative) of macula, right eye: Secondary | ICD-10-CM | POA: Diagnosis not present

## 2022-04-17 DIAGNOSIS — H524 Presbyopia: Secondary | ICD-10-CM | POA: Diagnosis not present

## 2022-04-17 DIAGNOSIS — H2513 Age-related nuclear cataract, bilateral: Secondary | ICD-10-CM | POA: Diagnosis not present

## 2022-04-17 DIAGNOSIS — R0683 Snoring: Secondary | ICD-10-CM

## 2022-04-17 DIAGNOSIS — H3554 Dystrophies primarily involving the retinal pigment epithelium: Secondary | ICD-10-CM | POA: Diagnosis not present

## 2022-04-17 DIAGNOSIS — E119 Type 2 diabetes mellitus without complications: Secondary | ICD-10-CM | POA: Diagnosis not present

## 2022-04-17 LAB — HM DIABETES EYE EXAM

## 2022-04-17 NOTE — Assessment & Plan Note (Signed)
History of mild sleep apnea with ongoing snoring, daytime sleepiness, restless sleep, gasping for air during sleep, BMI 52 and previous diagnosis of mild sleep apnea all suspicious for ongoing sleep apnea.  Patient was initially intolerant to CPAP.  Unfortunately she lost her CPAP   - discussed how weight can impact sleep and risk for sleep disordered breathing - discussed options to assist with weight loss: combination of diet modification, cardiovascular and strength training exercises   - had an extensive discussion regarding the adverse health consequences related to untreated sleep disordered breathing - specifically discussed the risks for hypertension, coronary artery disease, cardiac dysrhythmias, cerebrovascular disease, and diabetes - lifestyle modification discussed   - discussed how sleep disruption can increase risk of accidents, particularly when driving - safe driving practices were discussed     Plan  Patient Instructions  Set up for home sleep study.  Work on healthy weight loss  Do not drive if sleepy  Healthy sleep regimen  Follow up in 6-8 weeks to discuss results and treatment plan .

## 2022-04-17 NOTE — Progress Notes (Signed)
$@PatientV$  ID: Jennifer Rich, female    DOB: 11/11/1965, 57 y.o.   MRN: [id number]  Chief Complaint  Patient presents with   Consult    Referring provider: Mosie Lukes, MD  HPI: 57 year old female seen for sleep consult April 17, 2022 to establish for sleep apnea  TEST/EVENTS :  Home sleep study November 18, 2018 showed mild sleep apnea with AHI at 10.9/hour and SpO2 low at 80%.  04/17/2022 Sleep consult  Patient presents for sleep consult today to establish for sleep apnea.  Kindly referred by primary care provider Dr. Charlett Blake.  Patient was previously diagnosed with sleep apnea in September 2020.  Home sleep study showed mild sleep apnea the AHI at 10.9/hour and SpO2 low at 80%.  Patient was recommended to start CPAP therapy.  Patient has had difficulty tolerating CPAP.  Patient says she has very restless sleep.  Continues to have snoring.  Send feels very tired during the daytime.  She typically goes to sleep about 10 PM.  Takes about 30 minutes to go to sleep.  Is up multiple times throughout the night.  Gets up at 8 AM.  Patient's weight is up over the last 2 years.  Current weight is at 335 pounds with a BMI of 52.  Patient does not nap.  Does not use sleep aids.  Does not have any history of congestive heart failure or stroke.  No symptoms of cataplexy or sleep paralysis.  Caffeine intake is minimum.  Epworth score is 8 out of 24.  Typically gets sleepy if she sits down to watch TV, passenger of a car and in the afternoon hours. Moved from [place] and lost CPAP machine .   Social history patient is married.  Has no children.  Works as an Passenger transport manager.  She is a never smoker.,  No alcohol or drugs.  Previously lived in [place]  Family history positive for heart disease and diabetes  Surgical history none   Allergies  Allergen Reactions   Metoprolol Other (See Comments)   Lisinopril Cough and Other (See Comments)    Immunization History  Administered Date(s)  Administered   COVID-19, mRNA, vaccine(Comirnaty)12 years and older 01/24/2022   Influenza,inj,Quad PF,6+ Mos 12/16/2012, 11/28/2016   Influenza-Unspecified 01/01/2021, 01/14/2021   PFIZER(Purple Top)SARS-COV-2 Vaccination 06/30/2019, 07/21/2019, 02/20/2020   Pfizer Covid-19 Vaccine Bivalent Booster 43yr & up 02/02/2021   Pneumococcal Conjugate-13 12/27/2013   Pneumococcal Polysaccharide-23 09/22/2011   Td 01/02/2020   Tdap 12/16/2012   Unspecified SARS-COV-2 Vaccination 01/31/2021   Zoster Recombinat (Shingrix) 02/18/2019, 04/09/2021   Zoster, Live 04/09/2021    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   BLASTOCYSTIS HOMINIS     CAP (community acquired pneumonia) 04/17/2015   Cervical cancer screening 01/25/2013   Chicken pox as a child   Fatty liver    Goiter    Mildly enlarged thyroid TSH normal    Hepatitis C    was treated in 2022   Hyperglycemia    Hyperlipidemia    Hypertension    Obesity    Obesity, unspecified 04/26/2013   Pneumonia 05/02/2013   Preventative health care 12/18/2012   Has seen Dr GCarlota Raspberryfor GYN in past   Sleep apnea    Type II or unspecified type diabetes mellitus without mention of complication, not stated as uncontrolled    Vitamin D deficiency     Tobacco History: Social History  Tobacco Use  Smoking Status Never  Smokeless Tobacco Never   Counseling given: Not Answered   Outpatient Medications Prior to Visit  Medication Sig Dispense Refill   albuterol (VENTOLIN HFA) 108 (90 Base) MCG/ACT inhaler Inhale 1-2 puffs into the lungs every 6 (six) hours as needed for wheezing or shortness of breath. 1 each 0   albuterol (VENTOLIN HFA) 108 (90 Base) MCG/ACT inhaler TAKE 2 PUFFS BY MOUTH EVERY 6 HOURS AS NEEDED FOR WHEEZE OR SHORTNESS OF BREATH 18 each 1   atorvastatin (LIPITOR) 10 MG tablet Take 1 tablet (10 mg total) by mouth daily. 90 tablet 3    Cholecalciferol (VITAMIN D3) 25 MCG (1000 UT) CAPS Take 2 capsules (2,000 Units total) by mouth daily. 60 capsule    COVID-19 mRNA vaccine 2023-2024 (COMIRNATY) syringe Inject into the muscle. 0.3 mL 0   influenza vac split quadrivalent PF (FLUARIX QUADRIVALENT) 0.5 ML injection Inject into the muscle. 0.5 mL 0   losartan-hydrochlorothiazide (HYZAAR) 100-25 MG tablet TAKE 1 TABLET BY MOUTH EVERY DAY 30 tablet 5   metoprolol succinate (TOPROL-XL) 25 MG 24 hr tablet Take 1 tablet (25 mg total) by mouth daily. 90 tablet 1   spironolactone (ALDACTONE) 25 MG tablet TAKE 1 TABLET BY MOUTH TWICE A DAY 60 tablet 1   tirzepatide (MOUNJARO) 2.5 MG/0.5ML Pen Inject 2.5 mg into the skin once a week. 2 mL 1   valACYclovir (VALTREX) 500 MG tablet Take 500 mg by mouth as needed.     Vitamin D, Ergocalciferol, (DRISDOL) 1.25 MG (50000 UNIT) CAPS capsule Take 1 capsule (50,000 Units total) by mouth every 7 (seven) days. 4 capsule 3   No facility-administered medications prior to visit.     Review of Systems:   Constitutional:   No  weight loss, night sweats,  Fevers, chills,  +fatigue, or  lassitude.  HEENT:   No headaches,  Difficulty swallowing,  Tooth/dental problems, or  Sore throat,                No sneezing, itching, ear ache, nasal congestion, post nasal drip,   CV:  No chest pain,  Orthopnea, PND, swelling in lower extremities, anasarca, dizziness, palpitations, syncope.   GI  No heartburn, indigestion, abdominal pain, nausea, vomiting, diarrhea, change in bowel habits, loss of appetite, bloody stools.   Resp: No shortness of breath with exertion or at rest.  No excess mucus, no productive cough,  No non-productive cough,  No coughing up of blood.  No change in color of mucus.  No wheezing.  No chest wall deformity  Skin: no rash or lesions.  GU: no dysuria, change in color of urine, no urgency or frequency.  No flank pain, no hematuria   MS:  No joint pain or swelling.  No decreased range of  motion.  No back pain.    Physical Exam  BP (!) 140/80 (BP Location: Left Arm, Patient Position: Sitting, Cuff Size: Large)   Pulse 84   Temp 97.7 F (36.5 C) (Oral)   Ht 5' 7"$  (1.702 m)   Wt (!) 335 lb 9.6 oz (152.2 kg)   SpO2 100%   BMI 52.56 kg/m   GEN: A/Ox3; pleasant , NAD, well nourished    HEENT:  [place]/AT,   NOSE-clear, THROAT-clear, no lesions, no postnasal drip or exudate noted.  Class IV MP airway  NECK:  Supple w/ fair ROM; no JVD; normal carotid impulses w/o bruits; no thyromegaly or nodules palpated; no lymphadenopathy.    RESP  Clear  P & A; w/o, wheezes/ rales/ or rhonchi. no accessory muscle use, no dullness to percussion  CARD:  RRR, no m/r/g, no peripheral edema, pulses intact, no cyanosis or clubbing.  GI:   Soft & nt; nml bowel sounds; no organomegaly or masses detected.   Musco: Warm bil, no deformities or joint swelling noted.   Neuro: alert, no focal deficits noted.    Skin: Warm, no lesions or rashes      BNP No results found for: "BNP"   Imaging: No results found.        No data to display          No results found for: "NITRICOXIDE"      Assessment & Plan:   Obstructive sleep apnea syndrome History of mild sleep apnea with ongoing snoring, daytime sleepiness, restless sleep, gasping for air during sleep, BMI 52 and previous diagnosis of mild sleep apnea all suspicious for ongoing sleep apnea.  Patient was initially intolerant to CPAP.  Unfortunately she lost her CPAP   - discussed how weight can impact sleep and risk for sleep disordered breathing - discussed options to assist with weight loss: combination of diet modification, cardiovascular and strength training exercises   - had an extensive discussion regarding the adverse health consequences related to untreated sleep disordered breathing - specifically discussed the risks for hypertension, coronary artery disease, cardiac dysrhythmias, cerebrovascular disease, and  diabetes - lifestyle modification discussed   - discussed how sleep disruption can increase risk of accidents, particularly when driving - safe driving practices were discussed     Plan  Patient Instructions  Set up for home sleep study.  Work on healthy weight loss  Do not drive if sleepy  Healthy sleep regimen  Follow up in 6-8 weeks to discuss results and treatment plan .      Class 3 severe obesity with serious comorbidity and body mass index (BMI) of 50.0 to 59.9 in adult [facility]) Healthy weight loss discussed     Rexene Edison, NP 04/17/2022

## 2022-04-17 NOTE — Patient Instructions (Signed)
Set up for home sleep study.  Work on healthy weight loss  Do not drive if sleepy  Healthy sleep regimen  Follow up in 6-8 weeks to discuss results and treatment plan .

## 2022-04-17 NOTE — Assessment & Plan Note (Signed)
Healthy weight loss discussed 

## 2022-04-18 NOTE — Progress Notes (Signed)
Reviewed and agree with assessment/plan.   Chesley Mires, MD [facility] Pulmonary/Critical Care 04/18/2022, 11:04 AM Pager:  [phone]

## 2022-04-22 ENCOUNTER — Other Ambulatory Visit: Payer: Self-pay | Admitting: Family Medicine

## 2022-04-22 DIAGNOSIS — Z1231 Encounter for screening mammogram for malignant neoplasm of breast: Secondary | ICD-10-CM

## 2022-04-24 ENCOUNTER — Other Ambulatory Visit: Payer: Self-pay | Admitting: Family Medicine

## 2022-05-02 ENCOUNTER — Ambulatory Visit (INDEPENDENT_AMBULATORY_CARE_PROVIDER_SITE_OTHER): Payer: BC Managed Care – PPO | Admitting: Family Medicine

## 2022-05-02 ENCOUNTER — Encounter: Payer: Self-pay | Admitting: Family Medicine

## 2022-05-02 VITALS — BP 125/71 | HR 92 | Temp 97.8°F | Resp 18 | Ht 67.0 in | Wt 329.6 lb

## 2022-05-02 DIAGNOSIS — E1169 Type 2 diabetes mellitus with other specified complication: Secondary | ICD-10-CM

## 2022-05-02 DIAGNOSIS — I10 Essential (primary) hypertension: Secondary | ICD-10-CM

## 2022-05-02 DIAGNOSIS — Z6841 Body Mass Index (BMI) 40.0 and over, adult: Secondary | ICD-10-CM | POA: Diagnosis not present

## 2022-05-02 DIAGNOSIS — E559 Vitamin D deficiency, unspecified: Secondary | ICD-10-CM

## 2022-05-02 DIAGNOSIS — N289 Disorder of kidney and ureter, unspecified: Secondary | ICD-10-CM

## 2022-05-02 DIAGNOSIS — E7849 Other hyperlipidemia: Secondary | ICD-10-CM | POA: Diagnosis not present

## 2022-05-02 DIAGNOSIS — E669 Obesity, unspecified: Secondary | ICD-10-CM

## 2022-05-02 LAB — VITAMIN D 25 HYDROXY (VIT D DEFICIENCY, FRACTURES): VITD: 18.15 ng/mL — ABNORMAL LOW (ref 30.00–100.00)

## 2022-05-02 LAB — BASIC METABOLIC PANEL
BUN: 27 mg/dL — ABNORMAL HIGH (ref 6–23)
CO2: 27 mEq/L (ref 19–32)
Calcium: 9.9 mg/dL (ref 8.4–10.5)
Chloride: 99 mEq/L (ref 96–112)
Creatinine, Ser: 1.36 mg/dL — ABNORMAL HIGH (ref 0.40–1.20)
GFR: 43.66 mL/min — ABNORMAL LOW (ref >60.00)
Glucose, Bld: 93 mg/dL (ref 70–99)
Potassium: 4 mEq/L (ref 3.5–5.1)
Sodium: 136 mEq/L (ref 135–145)

## 2022-05-02 LAB — LIPID PANEL
Cholesterol: 187 mg/dL (ref 0–200)
HDL: 31 mg/dL — ABNORMAL LOW (ref >39.00)
LDL Cholesterol: 131 mg/dL — ABNORMAL HIGH (ref 0–99)
NonHDL: 155.88
Total CHOL/HDL Ratio: 6
Triglycerides: 124 mg/dL (ref 0.0–149.0)
VLDL: 24.8 mg/dL (ref 0.0–40.0)

## 2022-05-02 MED ORDER — TIRZEPATIDE 5 MG/0.5ML ~~LOC~~ SOAJ: 5.0000 mg | SUBCUTANEOUS | 1 refills | Status: DC

## 2022-05-02 NOTE — Assessment & Plan Note (Signed)
Poorly controlled with last A1c 8.4% Increase Mounjaro to 5 mg/week UTD on vaccines, eye exam, foot exam On ACEi  On Statin  Discussed diet and exercise F/u in 3 months with PCP

## 2022-05-02 NOTE — Assessment & Plan Note (Signed)
Blood pressure is at goal for age and co-morbidities.   Recommendations: continue current regimen - BP goal <130/80 - monitor and log blood pressures at home - check around the same time each day in a relaxed setting - Limit salt to <2000 mg/day - Follow DASH eating plan (heart healthy diet) - limit alcohol to 2 standard drinks per day for men and 1 per day for women - avoid tobacco products - get at least 2 hours of regular aerobic exercise weekly Patient aware of signs/symptoms requiring further/urgent evaluation. Labs updated today.

## 2022-05-02 NOTE — Assessment & Plan Note (Signed)
Continue supplementation Labs today

## 2022-05-02 NOTE — Assessment & Plan Note (Signed)
-  Reviewed most recent lipid panel -Medication management: continue Lipitor -Labs today -Diet low in saturated fat -Regular exercise - at least 30 minutes, 5 times per week

## 2022-05-02 NOTE — Progress Notes (Signed)
Established Patient Office Visit  Subjective   Patient ID: Jennifer Rich, female    DOB: Jul 24, 1965  Age: 57 y.o. MRN: [id number]  Chief Complaint  Patient presents with   Follow-up    Concerns/ questions:  pt says her upper back/ neck has been bothering her for 2 weeks.  Foot exam is due    HPI  Patient is here for follow-up on her diabetes, HTN, HLD, and Vitamin D deficiency. She would like to discuss some upper back/shoulder/neck discomfort. She is excited to be going on a cruise to the [place] with her extended family next week!  Diabetes: - Checking BG at home: post-prandial 120's, not checking fasting  - Medications: Mounjaro 2.5 mg/week (finished with 4 doses),  - Compliance: good - Diet: low carb, working really hard  - Exercise: walking - Eye exam: UTD - Foot exam: today  - Microalbumin: UTD - Denies symptoms of hypoglycemia, polyuria, polydipsia, numbness extremities, foot ulcers/trauma, wounds that are not healing, medication side effects  Lab Results  Component Value Date   HGBA1C 8.4 (H) 04/08/2022   Wt Readings from Last 3 Encounters:  05/02/22 (!) 329 lb 9.6 oz (149.5 kg)  04/17/22 (!) 335 lb 9.6 oz (152.2 kg)  04/16/22 (!) 336 lb (152.4 kg)     Hypertension: - Medications: losartan-hctz 100-25 mg, spironolactone 25 mg BID - Compliance: good - Checking BP at home: normal at home - Denies any SOB, recurrent headaches, CP, vision changes, LE edema, dizziness, palpitations, or medication side effects.    Hyperlipidemia: - medications: Lipitor 10 mg dialy  - compliance: good - medication SEs: no The 10-year ASCVD risk score (Arnett DK, et al., 2019) is: 14%   Values used to calculate the score:     Age: 75 years     Sex: Female     Is Non-Hispanic African American: Yes     Diabetic: Yes     Tobacco smoker: No     Systolic Blood Pressure: 0000000 mmHg     Is BP treated: Yes     HDL Cholesterol: 41 mg/dL     Total Cholesterol: 192  mg/dL   Vitamin D deficiency - high dose weekly supplement  - no symptoms   Acute concern: Patient reports she has been having some upper back/shoulder/neck discomfort and tightness for the past 2 weeks or so. Described as an achy tension. No decreased ROM, weakness, radiation, numbness, tingling. Reports she has poor posture sitting at the computer most of the day and also feels like she holds her stress in her neck.          ROS All review of systems negative except what is listed in the HPI    Objective:     BP 125/71 (BP Location: Left Arm, Patient Position: Sitting, Cuff Size: Large) Comment (Cuff Size): extra large  Pulse 92   Temp 97.8 F (36.6 C) (Oral)   Resp 18   Ht '5\' 7"'$  (1.702 m)   Wt (!) 329 lb 9.6 oz (149.5 kg)   SpO2 98%   BMI 51.62 kg/m    Physical Exam Vitals reviewed.  Constitutional:      Appearance: Normal appearance. She is obese.  HENT:     Head: Normocephalic and atraumatic.  Cardiovascular:     Rate and Rhythm: Normal rate and regular rhythm.     Pulses: Normal pulses.     Heart sounds: Normal heart sounds.  Pulmonary:     Effort: Pulmonary effort is  normal.     Breath sounds: Normal breath sounds.  Musculoskeletal:        General: No swelling or tenderness. Normal range of motion.     Cervical back: Normal range of motion and neck supple.     Comments: Bilateral upper traps with muscle tension on palpation   Feet:     Comments: Diabetic foot exam was performed.  No deformities or other abnormal visual findings.  Posterior tibialis and dorsalis pulse intact bilaterally.  Intact to touch and monofilament testing bilaterally.   Skin:    General: Skin is warm and dry.  Neurological:     Mental Status: She is alert and oriented to person, place, and time.  Psychiatric:        Mood and Affect: Mood normal.        Behavior: Behavior normal.        Thought Content: Thought content normal.        Judgment: Judgment normal.        No results found for any visits on 05/02/22.    The 10-year ASCVD risk score (Arnett DK, et al., 2019) is: 14%    Assessment & Plan:   Problem List Items Addressed This Visit       Cardiovascular and Mediastinum   Essential hypertension, benign - Primary    Blood pressure is at goal for age and co-morbidities.   Recommendations: continue current regimen - BP goal <130/80 - monitor and log blood pressures at home - check around the same time each day in a relaxed setting - Limit salt to <2000 mg/day - Follow DASH eating plan (heart healthy diet) - limit alcohol to 2 standard drinks per day for men and 1 per day for women - avoid tobacco products - get at least 2 hours of regular aerobic exercise weekly Patient aware of signs/symptoms requiring further/urgent evaluation. Labs updated today.       Relevant Orders   Basic Metabolic Panel (BMET)   Lipid panel     Endocrine   Diabetes mellitus type 2 in obese ([place])    Poorly controlled with last A1c 8.4% Increase Mounjaro to 5 mg/week UTD on vaccines, eye exam, foot exam On ACEi  On Statin  Discussed diet and exercise F/u in 3 months with PCP       Relevant Medications   tirzepatide (MOUNJARO) 5 MG/0.5ML Pen     Other   Hyperlipidemia    -Reviewed most recent lipid panel -Medication management: continue Lipitor -Labs today -Diet low in saturated fat -Regular exercise - at least 30 minutes, 5 times per week       Relevant Orders   Basic Metabolic Panel (BMET)   Lipid panel   Vitamin D deficiency    Continue supplementation Labs today      Relevant Orders   Vitamin D (25 hydroxy)   Class 3 severe obesity with serious comorbidity and body mass index (BMI) of 50.0 to 59.9 in adult [facility])    Discussed healthy weight loss goal and lifestyle measures       Relevant Medications   tirzepatide (MOUNJARO) 5 MG/0.5ML Pen   Other Relevant Orders   Basic Metabolic Panel (BMET)    Return in about 3  months (around 08/02/2022) for routine follow-up.    Terrilyn Saver, NP

## 2022-05-02 NOTE — Assessment & Plan Note (Signed)
Discussed healthy weight loss goal and lifestyle measures

## 2022-05-02 NOTE — Patient Instructions (Signed)
Increasing to next Mounjaro dose Continue with lifestyle measures Labs today  Enjoy your cruise!

## 2022-05-05 NOTE — Addendum Note (Signed)
Addended by: Caleen Jobs B on: 05/05/2022 10:59 AM   Modules accepted: Orders

## 2022-05-08 ENCOUNTER — Ambulatory Visit: Payer: BC Managed Care – PPO | Admitting: Family Medicine

## 2022-05-14 ENCOUNTER — Encounter: Payer: BC Managed Care – PPO | Admitting: Radiology

## 2022-05-19 ENCOUNTER — Encounter: Payer: Self-pay | Admitting: Family Medicine

## 2022-05-19 ENCOUNTER — Encounter (INDEPENDENT_AMBULATORY_CARE_PROVIDER_SITE_OTHER): Payer: BC Managed Care – PPO | Admitting: Ophthalmology

## 2022-05-19 ENCOUNTER — Encounter (INDEPENDENT_AMBULATORY_CARE_PROVIDER_SITE_OTHER): Payer: Self-pay

## 2022-05-19 DIAGNOSIS — H3581 Retinal edema: Secondary | ICD-10-CM

## 2022-05-21 ENCOUNTER — Encounter: Payer: Self-pay | Admitting: Family Medicine

## 2022-05-23 ENCOUNTER — Other Ambulatory Visit (INDEPENDENT_AMBULATORY_CARE_PROVIDER_SITE_OTHER): Payer: BC Managed Care – PPO

## 2022-05-23 DIAGNOSIS — N289 Disorder of kidney and ureter, unspecified: Secondary | ICD-10-CM

## 2022-05-23 NOTE — Addendum Note (Signed)
Addended by: Manuela Schwartz on: 05/23/2022 02:02 PM   Modules accepted: Orders

## 2022-05-24 LAB — BASIC METABOLIC PANEL
BUN/Creatinine Ratio: 16 (calc) (ref 6–22)
BUN: 20 mg/dL (ref 7–25)
CO2: 24 mmol/L (ref 20–32)
Calcium: 9.1 mg/dL (ref 8.6–10.4)
Chloride: 104 mmol/L (ref 98–110)
Creat: 1.28 mg/dL — ABNORMAL HIGH (ref 0.50–1.03)
Glucose, Bld: 108 mg/dL — ABNORMAL HIGH (ref 65–99)
Potassium: 3.9 mmol/L (ref 3.5–5.3)
Sodium: 142 mmol/L (ref 135–146)

## 2022-05-25 ENCOUNTER — Other Ambulatory Visit: Payer: Self-pay | Admitting: Family Medicine

## 2022-06-03 DIAGNOSIS — Z1231 Encounter for screening mammogram for malignant neoplasm of breast: Secondary | ICD-10-CM

## 2022-06-28 ENCOUNTER — Encounter (HOSPITAL_BASED_OUTPATIENT_CLINIC_OR_DEPARTMENT_OTHER): Payer: Self-pay | Admitting: Emergency Medicine

## 2022-06-28 ENCOUNTER — Emergency Department (HOSPITAL_BASED_OUTPATIENT_CLINIC_OR_DEPARTMENT_OTHER): Payer: BC Managed Care – PPO

## 2022-06-28 ENCOUNTER — Emergency Department (HOSPITAL_BASED_OUTPATIENT_CLINIC_OR_DEPARTMENT_OTHER)
Admission: EM | Admit: 2022-06-28 | Discharge: 2022-06-28 | Disposition: A | Discharge status: Home / Self Care | Payer: BC Managed Care – PPO | Attending: Emergency Medicine | Admitting: Emergency Medicine

## 2022-06-28 ENCOUNTER — Other Ambulatory Visit: Payer: Self-pay

## 2022-06-28 DIAGNOSIS — X500XXA Overexertion from strenuous movement or load, initial encounter: Secondary | ICD-10-CM | POA: Diagnosis not present

## 2022-06-28 DIAGNOSIS — M25511 Pain in right shoulder: Secondary | ICD-10-CM | POA: Diagnosis not present

## 2022-06-28 DIAGNOSIS — M19011 Primary osteoarthritis, right shoulder: Secondary | ICD-10-CM | POA: Diagnosis not present

## 2022-06-28 NOTE — Discharge Instructions (Addendum)
It was a pleasure taking care of you!   Your x-ray was negative for fracture or dislocation.  It did show degenerative changes to your right shoulder. You may take over the counter 600 mg Ibuprofen every 6 hours and alternate with 500 mg Tylenol every 6 hours as needed for pain for no more than 7 days. Attached is information for the on-call sports medicine doctor, you may call and set up a follow up appointment as needed. You may apply ice or heat to affected area for up to 15 minutes at a time. Ensure to place a barrier between your skin and the ice/heat.  You may follow-up with your primary care provider as needed.  Return to the Emergency Department if you are experiencing increasing/worsening symptoms.

## 2022-06-28 NOTE — ED Provider Notes (Signed)
[place] [facility] Provider Note   CSN: [id number] Arrival date & time: 06/28/22  4098     History  Chief Complaint  Patient presents with   Shoulder Pain    Jennifer Rich is a 57 y.o. female who presents to the ED with concerns for right shoulder pain onset this morning.  She notes that she was helping her sister lift of a [place] cabinet this morning prior to the onset of her symptoms.  Took ibuprofen prior to arrival.  Denies back pain, neck pain, arm pain.  The history is provided by the patient. No language interpreter was used.       Home Medications Prior to Admission medications   Medication Sig Start Date End Date Taking? Authorizing Provider  albuterol (VENTOLIN HFA) 108 (90 Base) MCG/ACT inhaler Inhale 1-2 puffs into the lungs every 6 (six) hours as needed for wheezing or shortness of breath. 01/22/21   Derwood Kaplan, MD  atorvastatin (LIPITOR) 10 MG tablet Take 1 tablet (10 mg total) by mouth daily. 04/11/21   Bradd Canary, MD  losartan-hydrochlorothiazide (HYZAAR) 100-25 MG tablet TAKE 1 TABLET BY MOUTH EVERY DAY 12/10/21   Bradd Canary, MD  spironolactone (ALDACTONE) 25 MG tablet TAKE 1 TABLET BY MOUTH TWICE A DAY 05/26/22   Bradd Canary, MD  tirzepatide [facility]) 5 MG/0.5ML Pen Inject 5 mg into the skin once a week. 05/02/22   Clayborne Dana, NP  valACYclovir (VALTREX) 500 MG tablet Take 500 mg by mouth as needed. 05/20/21   [provider]  Vitamin D, Ergocalciferol, (DRISDOL) 1.25 MG (50000 UNIT) CAPS capsule TAKE 1 CAPSULE (50,000 UNITS TOTAL) BY MOUTH EVERY 7 (SEVEN) DAYS 04/24/22   Bradd Canary, MD      Allergies    Metoprolol and Lisinopril    Review of Systems   Review of Systems  All other systems reviewed and are negative.   Physical Exam Updated Vital Signs BP (!) 150/94 (BP Location: Left Arm)   Pulse 81   Temp 98.1 F (36.7 C) (Oral)   Resp 19   Ht 5\' 7"  (1.702 m)   Wt (!) 142.9  kg   SpO2 97%   BMI 49.34 kg/m  Physical Exam Vitals and nursing note reviewed.  Constitutional:      General: She is not in acute distress.    Appearance: Normal appearance.  Eyes:     General: No scleral icterus.    Extraocular Movements: Extraocular movements intact.  Cardiovascular:     Rate and Rhythm: Normal rate.  Pulmonary:     Effort: Pulmonary effort is normal. No respiratory distress.  Abdominal:     Palpations: Abdomen is soft. There is no mass.     Tenderness: There is no abdominal tenderness.  Musculoskeletal:        General: Normal range of motion.     Cervical back: Neck supple.     Comments: Tenderness to palpation noted to anterior right shoulder.  No palpable deformity noted.  No spinal tenderness to palpation.  No tenderness to palpation noted to musculature of back.  Decreased range of motion of right shoulder secondary to pain.  Skin:    General: Skin is warm and dry.     Findings: No rash.  Neurological:     Mental Status: She is alert.     Sensory: Sensation is intact.     Motor: Motor function is intact.  Psychiatric:  Behavior: Behavior normal.     ED Results / Procedures / Treatments   Labs (all labs ordered are listed, but only abnormal results are displayed) Labs Reviewed - No data to display  EKG None  Radiology DG Shoulder Right  Result Date: 06/28/2022 CLINICAL DATA:  Pain EXAM: RIGHT SHOULDER - 2+ VIEW COMPARISON:  None Available. FINDINGS: Mild glenohumeral degenerative changes with a small osteophyte off the inferior glenoid. No fracture or dislocation. No other bony or soft tissue abnormalities are identified. IMPRESSION: Mild glenohumeral degenerative changes. Electronically Signed   By: Gerome Sam III M.D.   On: 06/28/2022 09:43    Procedures Procedures    Medications Ordered in ED Medications - No data to display  ED Course/ Medical Decision Making/ A&P                             Medical Decision  Making Amount and/or Complexity of Data Reviewed Radiology: ordered.   Patient with right shoulder pain onset PTA status post lifting a [place] cabinet. Vital signs pt afebrile. On exam, patient with Tenderness to palpation noted to anterior right shoulder.  No palpable deformity noted.  No spinal tenderness to palpation.  No tenderness to palpation noted to musculature of back.  Decreased range of motion of right shoulder secondary to pain. Differential diagnosis includes fracture, dislocation, sprain.  Imaging: I ordered imaging studies including right shoulder xray I independently visualized and interpreted imaging which showed:  Mild glenohumeral degenerative changes.  I agree with the radiologist interpretation  Disposition: Patient presentation suspicious for degenerative changes of the right shoulder.  Doubt concerns at this time for fracture, dislocation, sprain.  After consideration of the diagnostic results and the patients response to treatment, I feel that the patient would benefit from Discharge home.  Patient given information for sports medicine doctor for follow-up as needed. Supportive care measures and strict return precautions discussed with patient at bedside. Pt acknowledges and verbalizes understanding. Pt appears safe for discharge. Follow up as indicated in discharge paperwork.    This chart was dictated using voice recognition software, Dragon. Despite the best efforts of this provider to proofread and correct errors, errors may still occur which can change documentation meaning. Final Clinical Impression(s) / ED Diagnoses Final diagnoses:  Acute pain of right shoulder    Rx / DC Orders ED Discharge Orders     None         Tisa Weisel A, PA-C 06/28/22 1011    Ernie Avena, MD 06/28/22 1404

## 2022-06-28 NOTE — ED Triage Notes (Signed)
Patient c/o right shoulder pain after moving a [place] cabinet this morning.

## 2022-07-04 ENCOUNTER — Ambulatory Visit
Admission: RE | Admit: 2022-07-04 | Discharge: 2022-07-04 | Disposition: A | Discharge status: Home / Self Care | Payer: BC Managed Care – PPO | Source: Ambulatory Visit | Attending: Family Medicine | Admitting: Family Medicine

## 2022-07-04 DIAGNOSIS — Z1231 Encounter for screening mammogram for malignant neoplasm of breast: Secondary | ICD-10-CM | POA: Diagnosis not present

## 2022-07-07 ENCOUNTER — Ambulatory Visit (INDEPENDENT_AMBULATORY_CARE_PROVIDER_SITE_OTHER): Payer: BC Managed Care – PPO | Admitting: Family Medicine

## 2022-07-07 VITALS — BP 132/93 | Ht 66.5 in | Wt 315.0 lb

## 2022-07-07 DIAGNOSIS — M19011 Primary osteoarthritis, right shoulder: Secondary | ICD-10-CM

## 2022-07-07 DIAGNOSIS — Z01419 Encounter for gynecological examination (general) (routine) without abnormal findings: Secondary | ICD-10-CM | POA: Diagnosis not present

## 2022-07-07 DIAGNOSIS — Z6841 Body Mass Index (BMI) 40.0 and over, adult: Secondary | ICD-10-CM | POA: Diagnosis not present

## 2022-07-07 NOTE — Assessment & Plan Note (Signed)
Acutely occurring.  Likely an exacerbation of her underlying degenerative changes of the joint.  Having little to no pain today. -Counseled on home exercise therapy and supportive care. -Could consider physical therapy

## 2022-07-07 NOTE — Progress Notes (Signed)
  Jennifer Rich - 57 y.o. female MRN [id number]  Date of birth: 03-11-1965  SUBJECTIVE:  Including CC & ROS.  No chief complaint on file.   Jennifer Rich is a 57 y.o. female that is  presenting with acute right shoulder pain.  Pain was occurring after she was helping move an object.  No history of similar pain.  No history of surgery.    Review of Systems See HPI   HISTORY: Past Medical, Surgical, Social, and Family History Reviewed & Updated per EMR.   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   BLASTOCYSTIS HOMINIS     CAP (community acquired pneumonia) 04/17/2015   Cervical cancer screening 01/25/2013   Chicken pox as a child   Fatty liver    Goiter    Mildly enlarged thyroid TSH normal    Hepatitis C    was treated in 2022   Hyperglycemia    Hyperlipidemia    Hypertension    Obesity    Obesity, unspecified 04/26/2013   Pneumonia 05/02/2013   Preventative health care 12/18/2012   Has seen Dr Neva Seat for GYN in past   Sleep apnea    Type II or unspecified type diabetes mellitus without mention of complication, not stated as uncontrolled    Vitamin D deficiency     Past Surgical History:  Procedure Laterality Date   WISDOM TOOTH EXTRACTION  57 yrs old     PHYSICAL EXAM:  VS: BP (!) 132/93   Ht 5' 6.5" (1.689 m)   Wt (!) 315 lb (142.9 kg)   BMI 50.08 kg/m  Physical Exam Gen: NAD, alert, cooperative with exam, well-appearing MSK:  Neurovascularly intact       ASSESSMENT & PLAN:   Primary osteoarthritis of right shoulder Acutely occurring.  Likely an exacerbation of her underlying degenerative changes of the joint.  Having little to no pain today. -Counseled on home exercise therapy and supportive care. -Could consider physical therapy

## 2022-07-07 NOTE — Patient Instructions (Signed)
Nice to meet you Please alternate heat and ice  Please try the exercises  You can try over the counter voltaren rub on gel  Please send me a message in MyChart with any questions or updates.  Please see me back as needed.   --Dr. Jordan Likes

## 2022-07-23 NOTE — Assessment & Plan Note (Signed)
hgba1c acceptable, minimize simple carbs. Increase exercise as tolerated.  

## 2022-07-23 NOTE — Assessment & Plan Note (Signed)
Encourage heart healthy diet such as MIND or DASH diet, increase exercise, avoid trans fats, simple carbohydrates and processed foods, consider a krill or fish or flaxseed oil cap daily. Tolerating statin 

## 2022-07-23 NOTE — Assessment & Plan Note (Signed)
Encouraged DASH or MIND diet, decrease po intake and increase exercise as tolerated. Needs 7-8 hours of sleep nightly. Avoid trans fats, eat small, frequent meals every 4-5 hours with lean proteins, complex carbs and healthy fats. Minimize simple carbs, high fat foods and processed foods 

## 2022-07-23 NOTE — Assessment & Plan Note (Addendum)
>>  ASSESSMENT AND PLAN FOR TYPE 2 DIABETES MELLITUS IN PATIENT WITH OBESITY (HCC) WRITTEN ON 07/23/2022 10:47 PM BY Giacomo Valone A, MD  hgba1c acceptable, minimize simple carbs. Increase exercise as tolerated. Continue current meds    >>ASSESSMENT AND PLAN FOR HYPERGLYCEMIA WRITTEN ON 07/23/2022 10:47 PM BY Cheryl Stabenow A, MD  hgba1c acceptable, minimize simple carbs. Increase exercise as tolerated.

## 2022-07-23 NOTE — Assessment & Plan Note (Signed)
Supplement and monitor 

## 2022-07-23 NOTE — Assessment & Plan Note (Signed)
Monitor and report any concerns, no changes to meds. Encouraged heart healthy diet such as the DASH diet and exercise as tolerated.  ?

## 2022-07-24 ENCOUNTER — Ambulatory Visit (INDEPENDENT_AMBULATORY_CARE_PROVIDER_SITE_OTHER): Payer: BC Managed Care – PPO | Admitting: Family Medicine

## 2022-07-24 ENCOUNTER — Encounter: Payer: Self-pay | Admitting: Family Medicine

## 2022-07-24 VITALS — BP 130/82 | HR 89 | Temp 97.7°F | Resp 16 | Ht 66.0 in | Wt 329.2 lb

## 2022-07-24 DIAGNOSIS — Z7985 Long-term (current) use of injectable non-insulin antidiabetic drugs: Secondary | ICD-10-CM | POA: Diagnosis not present

## 2022-07-24 DIAGNOSIS — E278 Other specified disorders of adrenal gland: Secondary | ICD-10-CM

## 2022-07-24 DIAGNOSIS — E279 Disorder of adrenal gland, unspecified: Secondary | ICD-10-CM

## 2022-07-24 DIAGNOSIS — Z6841 Body Mass Index (BMI) 40.0 and over, adult: Secondary | ICD-10-CM

## 2022-07-24 DIAGNOSIS — E538 Deficiency of other specified B group vitamins: Secondary | ICD-10-CM

## 2022-07-24 DIAGNOSIS — E669 Obesity, unspecified: Secondary | ICD-10-CM

## 2022-07-24 DIAGNOSIS — R739 Hyperglycemia, unspecified: Secondary | ICD-10-CM

## 2022-07-24 DIAGNOSIS — E559 Vitamin D deficiency, unspecified: Secondary | ICD-10-CM | POA: Diagnosis not present

## 2022-07-24 DIAGNOSIS — E1169 Type 2 diabetes mellitus with other specified complication: Secondary | ICD-10-CM | POA: Diagnosis not present

## 2022-07-24 DIAGNOSIS — I10 Essential (primary) hypertension: Secondary | ICD-10-CM | POA: Diagnosis not present

## 2022-07-24 DIAGNOSIS — E7849 Other hyperlipidemia: Secondary | ICD-10-CM | POA: Diagnosis not present

## 2022-07-24 LAB — COMPREHENSIVE METABOLIC PANEL
ALT: 7 U/L (ref 0–35)
AST: 15 U/L (ref 0–37)
Albumin: 3.9 g/dL (ref 3.5–5.2)
Alkaline Phosphatase: 86 U/L (ref 39–117)
BUN: 20 mg/dL (ref 6–23)
CO2: 30 mEq/L (ref 19–32)
Calcium: 9.3 mg/dL (ref 8.4–10.5)
Chloride: 101 mEq/L (ref 96–112)
Creatinine, Ser: 1.32 mg/dL — ABNORMAL HIGH (ref 0.40–1.20)
GFR: 45.18 mL/min — ABNORMAL LOW (ref >60.00)
Glucose, Bld: 90 mg/dL (ref 70–99)
Potassium: 4.4 mEq/L (ref 3.5–5.1)
Sodium: 139 mEq/L (ref 135–145)
Total Bilirubin: 0.7 mg/dL (ref 0.2–1.2)
Total Protein: 7.4 g/dL (ref 6.0–8.3)

## 2022-07-24 LAB — VITAMIN D 25 HYDROXY (VIT D DEFICIENCY, FRACTURES): VITD: 31.3 ng/mL (ref 30.00–100.00)

## 2022-07-24 MED ORDER — TIRZEPATIDE 7.5 MG/0.5ML ~~LOC~~ SOAJ: 7.5000 mg | SUBCUTANEOUS | 1 refills | Status: DC

## 2022-07-24 NOTE — Patient Instructions (Signed)
Hypertension, Adult High blood pressure (hypertension) is when the force of blood pumping through the arteries is too strong. The arteries are the blood vessels that carry blood from the heart throughout the body. Hypertension forces the heart to work harder to pump blood and may cause arteries to become narrow or stiff. Untreated or uncontrolled hypertension can lead to a heart attack, heart failure, a stroke, kidney disease, and other problems. A blood pressure reading consists of a higher number over a lower number. Ideally, your blood pressure should be below 120/80. The first ("top") number is called the systolic pressure. It is a measure of the pressure in your arteries as your heart beats. The second ("bottom") number is called the diastolic pressure. It is a measure of the pressure in your arteries as the heart relaxes. What are the causes? The exact cause of this condition is not known. There are some conditions that result in high blood pressure. What increases the risk? Certain factors may make you more likely to develop high blood pressure. Some of these risk factors are under your control, including: Smoking. Not getting enough exercise or physical activity. Being overweight. Having too much fat, sugar, calories, or salt (sodium) in your diet. Drinking too much alcohol. Other risk factors include: Having a personal history of heart disease, diabetes, high cholesterol, or kidney disease. Stress. Having a family history of high blood pressure and high cholesterol. Having obstructive sleep apnea. Age. The risk increases with age. What are the signs or symptoms? High blood pressure may not cause symptoms. Very high blood pressure (hypertensive crisis) may cause: Headache. Fast or irregular heartbeats (palpitations). Shortness of breath. Nosebleed. Nausea and vomiting. Vision changes. Severe chest pain, dizziness, and seizures. How is this diagnosed? This condition is diagnosed by  measuring your blood pressure while you are seated, with your arm resting on a flat surface, your legs uncrossed, and your feet flat on the floor. The cuff of the blood pressure monitor will be placed directly against the skin of your upper arm at the level of your heart. Blood pressure should be measured at least twice using the same arm. Certain conditions can cause a difference in blood pressure between your right and left arms. If you have a high blood pressure reading during one visit or you have normal blood pressure with other risk factors, you may be asked to: Return on a different day to have your blood pressure checked again. Monitor your blood pressure at home for 1 week or longer. If you are diagnosed with hypertension, you may have other blood or imaging tests to help your health care provider understand your overall risk for other conditions. How is this treated? This condition is treated by making healthy lifestyle changes, such as eating healthy foods, exercising more, and reducing your alcohol intake. You may be referred for counseling on a healthy diet and physical activity. Your health care provider may prescribe medicine if lifestyle changes are not enough to get your blood pressure under control and if: Your systolic blood pressure is above 130. Your diastolic blood pressure is above 80. Your personal target blood pressure may vary depending on your medical conditions, your age, and other factors. Follow these instructions at home: Eating and drinking  Eat a diet that is high in fiber and potassium, and low in sodium, added sugar, and fat. An example of this eating plan is called the DASH diet. DASH stands for Dietary Approaches to Stop Hypertension. To eat this way: Eat   plenty of fresh fruits and vegetables. Try to fill one half of your plate at each meal with fruits and vegetables. Eat whole grains, such as whole-wheat pasta, brown rice, or whole-grain bread. Fill about one  fourth of your plate with whole grains. Eat or drink low-fat dairy products, such as skim milk or low-fat yogurt. Avoid fatty cuts of meat, processed or cured meats, and poultry with skin. Fill about one fourth of your plate with lean proteins, such as fish, chicken without skin, beans, eggs, or tofu. Avoid pre-made and processed foods. These tend to be higher in sodium, added sugar, and fat. Reduce your daily sodium intake. Many people with hypertension should eat less than 1,500 mg of sodium a day. Do not drink alcohol if: Your health care provider tells you not to drink. You are pregnant, may be pregnant, or are planning to become pregnant. If you drink alcohol: Limit how much you have to: 0-1 drink a day for women. 0-2 drinks a day for men. Know how much alcohol is in your drink. In the [place] one drink equals one 12 oz bottle of beer (355 mL), one 5 oz glass of wine (148 mL), or one 1 oz glass of hard liquor (44 mL). Lifestyle  Work with your health care provider to maintain a healthy body weight or to lose weight. Ask what an ideal weight is for you. Get at least 30 minutes of exercise that causes your heart to beat faster (aerobic exercise) most days of the week. Activities may include walking, swimming, or biking. Include exercise to strengthen your muscles (resistance exercise), such as Pilates or lifting weights, as part of your weekly exercise routine. Try to do these types of exercises for 30 minutes at least 3 days a week. Do not use any products that contain nicotine or tobacco. These products include cigarettes, chewing tobacco, and vaping devices, such as e-cigarettes. If you need help quitting, ask your health care provider. Monitor your blood pressure at home as told by your health care provider. Keep all follow-up visits. This is important. Medicines Take over-the-counter and prescription medicines only as told by your health care provider. Follow directions carefully. Blood  pressure medicines must be taken as prescribed. Do not skip doses of blood pressure medicine. Doing this puts you at risk for problems and can make the medicine less effective. Ask your health care provider about side effects or reactions to medicines that you should watch for. Contact a health care provider if you: Think you are having a reaction to a medicine you are taking. Have headaches that keep coming back (recurring). Feel dizzy. Have swelling in your ankles. Have trouble with your vision. Get help right away if you: Develop a severe headache or confusion. Have unusual weakness or numbness. Feel faint. Have severe pain in your chest or abdomen. Vomit repeatedly. Have trouble breathing. These symptoms may be an emergency. Get help right away. Call 911. Do not wait to see if the symptoms will go away. Do not drive yourself to the hospital. Summary Hypertension is when the force of blood pumping through your arteries is too strong. If this condition is not controlled, it may put you at risk for serious complications. Your personal target blood pressure may vary depending on your medical conditions, your age, and other factors. For most people, a normal blood pressure is less than 120/80. Hypertension is treated with lifestyle changes, medicines, or a combination of both. Lifestyle changes include losing weight, eating a healthy,   low-sodium diet, exercising more, and limiting alcohol. This information is not intended to replace advice given to you by your health care provider. Make sure you discuss any questions you have with your health care provider. Document Revised: 12/25/2020 Document Reviewed: 12/25/2020 Elsevier Patient Education  2024 Elsevier Inc.  

## 2022-07-24 NOTE — Progress Notes (Signed)
Subjective:   By signing my name below, I, Vickey Sages, attest that this documentation has been prepared under the direction and in the presence of Bradd Canary, MD., 07/24/2022.   Patient ID: Jennifer Rich, female    DOB: 1965-07-11, 57 y.o.   MRN: [id number]  Chief Complaint  Patient presents with   Weight Loss    Discuss weight loss   HPI Patient is in today for an office visit and is accompanied by her husband. She denies recent illness or hospitalizations.  Hypertension Patient reports that her blood pressure has recently been well-controlled, being within the range of 130/80. She continues taking Losartan-hydrochlorothiazide 100-25 mg and Spironolactone 25 mg which have been tolerable. Today she denies CP/palpitations/SOB/HA/fever/chills/GI or GU symptoms.  Left Adrenal Nodule Patient had a CT scan of the abdomen without contrast on 07/31/2021 which revealed: post-contrast imaging of the small left adrenal nodule is technically indeterminate, however, the low attenuation on pre-contrast images is strongly suggestive of a benign adenoma. A repeat scan was recommended in one year and will be ordered today.  Tinea Patient presents with several pale patches bilaterally across her arms and thighs. These have been present for several months but she denies itchiness or pain.   Weight Loss Patient currently injects Mounjaro 5 mg/0.5 mL once daily which has been tolerable. She has been attempting to maintain a balanced diet and exercise. Body mass index is 53.13 kg/m. 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   BLASTOCYSTIS HOMINIS     CAP (community acquired pneumonia)  04/17/2015   Cervical cancer screening 01/25/2013   Chicken pox as a child   Fatty liver    Goiter    Mildly enlarged thyroid TSH normal    Hepatitis C    was treated in 2022   Hyperglycemia    Hyperlipidemia    Hypertension    Obesity    Obesity, unspecified 04/26/2013   Pneumonia 05/02/2013   Preventative health care 12/18/2012   Has seen Dr Neva Seat for GYN in past   Sleep apnea    Type II or unspecified type diabetes mellitus without mention of complication, not stated as uncontrolled    Vitamin D deficiency     Past Surgical History:  Procedure Laterality Date   WISDOM TOOTH EXTRACTION  57 yrs old    Family History  Problem Relation Age of Onset   High blood pressure Mother    High Cholesterol Mother    High blood pressure Father    Alcoholism Father    Hypertension Sister    Hypertension Sister    Diabetes Maternal Grandmother        type 2   Blindness Maternal Grandmother    Heart attack Maternal Grandfather 75   Heart disease Maternal Grandfather    Hypertension Paternal Grandmother    Stroke Paternal Grandfather    Colon cancer Neg Hx    Esophageal cancer Neg Hx    Stomach cancer Neg Hx     Social History   Socioeconomic History   Marital status: Married    Spouse name: Not on file   Number of children: 0  Years of education: Not on file   Highest education level: Associate degree: academic program  Occupational History   Occupation: Surveyor, minerals: [employer]  Tobacco Use   Smoking status: Never   Smokeless tobacco: Never  Vaping Use   Vaping Use: Never used  Substance and Sexual Activity   Alcohol use: No   Drug use: No   Sexual activity: Yes    Partners: Male    Comment: lives with husband and [place], [name], no dietary restrictions.  Other Topics Concern   Not on file  Social History Narrative   No Pork         Social Determinants of Health   Financial Resource Strain: Low Risk  (07/23/2022)   Overall Financial  Resource Strain (CARDIA)    Difficulty of Paying Living Expenses: Not hard at all  Food Insecurity: No Food Insecurity (07/23/2022)   Hunger Vital Sign    Worried About Running Out of Food in the Last Year: Never true    Ran Out of Food in the Last Year: Never true  Transportation Needs: No Transportation Needs (07/23/2022)   PRAPARE - Administrator, Civil Service (Medical): No    Lack of Transportation (Non-Medical): No  Physical Activity: Insufficiently Active (07/23/2022)   Exercise Vital Sign    Days of Exercise per Week: 2 days    Minutes of Exercise per Session: 10 min  Stress: Stress Concern Present (07/23/2022)   [employer] of Occupational Health - Occupational Stress Questionnaire    Feeling of Stress : To some extent  Social Connections: Moderately Integrated (07/23/2022)   Social Connection and Isolation Panel [NHANES]    Frequency of Communication with Friends and Family: More than three times a week    Frequency of Social Gatherings with Friends and Family: More than three times a week    Attends Religious Services: More than 4 times per year    Active Member of [employer] or Organizations: No    Attends Engineer, structural: Not on file    Marital Status: Married  Catering manager Violence: Not on file    Outpatient Medications Prior to Visit  Medication Sig Dispense Refill   albuterol (VENTOLIN HFA) 108 (90 Base) MCG/ACT inhaler Inhale 1-2 puffs into the lungs every 6 (six) hours as needed for wheezing or shortness of breath. 1 each 0   atorvastatin (LIPITOR) 10 MG tablet Take 1 tablet (10 mg total) by mouth daily. 90 tablet 3   losartan-hydrochlorothiazide (HYZAAR) 100-25 MG tablet TAKE 1 TABLET BY MOUTH EVERY DAY 30 tablet 5   spironolactone (ALDACTONE) 25 MG tablet TAKE 1 TABLET BY MOUTH TWICE A DAY 60 tablet 1   valACYclovir (VALTREX) 500 MG tablet Take 500 mg by mouth as needed.     Vitamin D, Ergocalciferol, (DRISDOL) 1.25 MG (50000 UNIT) CAPS  capsule TAKE 1 CAPSULE (50,000 UNITS TOTAL) BY MOUTH EVERY 7 (SEVEN) DAYS 4 capsule 3   tirzepatide (MOUNJARO) 5 MG/0.5ML Pen Inject 5 mg into the skin once a week. 6 mL 1   No facility-administered medications prior to visit.    Allergies  Allergen Reactions   Metoprolol Other (See Comments)   Lisinopril Cough and Other (See Comments)    Review of Systems  Constitutional:  Negative for chills and fever.  Respiratory:  Negative for shortness of breath.   Cardiovascular:  Negative for chest pain and palpitations.  Gastrointestinal:  Negative for abdominal pain, blood in stool, constipation, diarrhea,  nausea and vomiting.  Genitourinary:  Negative for dysuria, frequency, hematuria and urgency.  Skin:           Neurological:  Negative for headaches.       Objective:    Physical Exam Constitutional:      General: She is not in acute distress.    Appearance: Normal appearance. She is obese. She is not ill-appearing.  HENT:     Head: Normocephalic and atraumatic.     Right Ear: External ear normal.     Left Ear: External ear normal.     Nose: Nose normal.     Mouth/Throat:     Mouth: Mucous membranes are moist.     Pharynx: Oropharynx is clear.  Eyes:     General:        Right eye: No discharge.        Left eye: No discharge.     Extraocular Movements: Extraocular movements intact.     Conjunctiva/sclera: Conjunctivae normal.     Pupils: Pupils are equal, round, and reactive to light.  Cardiovascular:     Rate and Rhythm: Normal rate and regular rhythm.     Pulses: Normal pulses.     Heart sounds: Normal heart sounds. No murmur heard.    No gallop.  Pulmonary:     Effort: Pulmonary effort is normal. No respiratory distress.     Breath sounds: Normal breath sounds. No wheezing or rales.  Abdominal:     General: Bowel sounds are normal.     Palpations: Abdomen is soft.     Tenderness: There is no abdominal tenderness. There is no guarding.  Musculoskeletal:         General: Normal range of motion.     Cervical back: Normal range of motion.     Right lower leg: No edema.     Left lower leg: No edema.  Skin:    General: Skin is warm and dry.     Comments: There are multiple pale patches bilaterally across the arms and thighs.  Neurological:     Mental Status: She is alert and oriented to person, place, and time.  Psychiatric:        Mood and Affect: Mood normal.        Behavior: Behavior normal.        Judgment: Judgment normal.     BP 130/82 (BP Location: Right Arm, Patient Position: Sitting, Cuff Size: Normal)   Pulse 89   Temp 97.7 F (36.5 C) (Oral)   Resp 16   Ht 5\' 6"  (1.676 m)   Wt (!) 329 lb 3.2 oz (149.3 kg)   SpO2 96%   BMI 53.13 kg/m  Wt Readings from Last 3 Encounters:  07/24/22 (!) 329 lb 3.2 oz (149.3 kg)  07/07/22 (!) 315 lb (142.9 kg)  06/28/22 (!) 315 lb (142.9 kg)    Diabetic Foot Exam - Simple   No data filed    Lab Results  Component Value Date   WBC 5.6 04/06/2022   HGB 13.0 04/06/2022   HCT 41.6 04/06/2022   PLT 210 04/06/2022   GLUCOSE 90 07/24/2022   CHOL 187 05/02/2022   TRIG 124.0 05/02/2022   HDL 31.00 (L) 05/02/2022   LDLCALC 131 (H) 05/02/2022   ALT 7 07/24/2022   AST 15 07/24/2022   NA 139 07/24/2022   K 4.4 07/24/2022   CL 101 07/24/2022   CREATININE 1.32 (H) 07/24/2022   BUN 20 07/24/2022   CO2 30  07/24/2022   TSH 1.480 11/18/2021   HGBA1C 8.4 (H) 04/08/2022    Lab Results  Component Value Date   TSH 1.480 11/18/2021   Lab Results  Component Value Date   WBC 5.6 04/06/2022   HGB 13.0 04/06/2022   HCT 41.6 04/06/2022   MCV 79.4 (L) 04/06/2022   PLT 210 04/06/2022   Lab Results  Component Value Date   NA 139 07/24/2022   K 4.4 07/24/2022   CO2 30 07/24/2022   GLUCOSE 90 07/24/2022   BUN 20 07/24/2022   CREATININE 1.32 (H) 07/24/2022   BILITOT 0.7 07/24/2022   ALKPHOS 86 07/24/2022   AST 15 07/24/2022   ALT 7 07/24/2022   PROT 7.4 07/24/2022   ALBUMIN 3.9 07/24/2022    CALCIUM 9.3 07/24/2022   ANIONGAP 7 04/06/2022   EGFR 62 11/18/2021   GFR 45.18 (L) 07/24/2022   Lab Results  Component Value Date   CHOL 187 05/02/2022   Lab Results  Component Value Date   HDL 31.00 (L) 05/02/2022   Lab Results  Component Value Date   LDLCALC 131 (H) 05/02/2022   Lab Results  Component Value Date   TRIG 124.0 05/02/2022   Lab Results  Component Value Date   CHOLHDL 6 05/02/2022   Lab Results  Component Value Date   HGBA1C 8.4 (H) 04/08/2022      Assessment & Plan:  Hypertension: This is well-controlled with Losartan-hydrochlorothiazide 100-25 mg and Spironolactone 25 mg. There were no medication adjustments today to manage hypertension.  Labs: Routine blood work ordered today.  Left Adrenal Nodule: CT adrenal abdomen without contrast ordered.  Weight Loss: This is going well with Mounjaro 5 mg/0.5 mL once weekly. Mounjaro increased to 7.5 mg/0.5 mL.  Problem List Items Addressed This Visit     Class 3 severe obesity with serious comorbidity and body mass index (BMI) of 50.0 to 59.9 in adult [facility])    Encouraged DASH or MIND diet, decrease po intake and increase exercise as tolerated. Needs 7-8 hours of sleep nightly. Avoid trans fats, eat small, frequent meals every 4-5 hours with lean proteins, complex carbs and healthy fats. Minimize simple carbs, high fat foods and processed foods       Relevant Medications   tirzepatide (MOUNJARO) 7.5 MG/0.5ML Pen   Essential hypertension, benign    Monitor and report any concerns, no changes to meds. Encouraged heart healthy diet such as the DASH diet and exercise as tolerated.       Relevant Orders   Comp Met (CMET) (Completed)   Hyperglycemia    hgba1c acceptable, minimize simple carbs. Increase exercise as tolerated.       Hyperlipidemia    Encourage heart healthy diet such as MIND or DASH diet, increase exercise, avoid trans fats, simple carbohydrates and processed foods, consider a krill or fish  or flaxseed oil cap daily. Tolerating statin      Type 2 diabetes mellitus with obesity (HCC) - Primary    hgba1c acceptable, minimize simple carbs. Increase exercise as tolerated. Continue current meds       Relevant Medications   tirzepatide (MOUNJARO) 7.5 MG/0.5ML Pen   Vitamin B 12 deficiency    Supplement and monitor       Vitamin D deficiency    Supplement and monitor       Relevant Orders   VITAMIN D 25 Hydroxy (Vit-D Deficiency, Fractures) (Completed)   Comp Met (CMET) (Completed)   Adrenal mass 1 cm to 4 cm in  diameter [facility])    Surveillance CT ordered today      Lesion of adrenal gland (HCC)   Relevant Orders   CT ADRENAL ABDOMEN WO CONTRAST   Obesity    Tolerating Mounjaro and good response. Increase Mounjaro to 7.5 mg weekly      Relevant Medications   tirzepatide (MOUNJARO) 7.5 MG/0.5ML Pen   Meds ordered this encounter  Medications   tirzepatide (MOUNJARO) 7.5 MG/0.5ML Pen    Sig: Inject 7.5 mg into the skin once a week.    Dispense:  6 mL    Refill:  1   I, Danise Edge, MD, personally preformed the services described in this documentation.  All medical record entries made by the scribe were at my direction and in my presence.  I have reviewed the chart and discharge instructions (if applicable) and agree that the record reflects my personal performance and is accurate and complete. 07/24/2022  I,Mohammed Iqbal,acting as a scribe for Danise Edge, MD.,have documented all relevant documentation on the behalf of Danise Edge, MD,as directed by  Danise Edge, MD while in the presence of Danise Edge, MD.  Danise Edge, MD

## 2022-07-24 NOTE — Telephone Encounter (Signed)
Okay to write work note.

## 2022-07-28 ENCOUNTER — Encounter: Payer: Self-pay | Admitting: Family Medicine

## 2022-07-28 DIAGNOSIS — E279 Disorder of adrenal gland, unspecified: Secondary | ICD-10-CM | POA: Insufficient documentation

## 2022-07-28 NOTE — Assessment & Plan Note (Signed)
Surveillance CT ordered today

## 2022-07-28 NOTE — Assessment & Plan Note (Signed)
Tolerating Mounjaro and good response. Increase Mounjaro to 7.5 mg weekly

## 2022-07-30 ENCOUNTER — Other Ambulatory Visit: Payer: Self-pay | Admitting: Family Medicine

## 2022-08-02 ENCOUNTER — Ambulatory Visit (HOSPITAL_BASED_OUTPATIENT_CLINIC_OR_DEPARTMENT_OTHER)
Admission: RE | Admit: 2022-08-02 | Discharge: 2022-08-02 | Disposition: A | Discharge status: Home / Self Care | Payer: BC Managed Care – PPO | Source: Ambulatory Visit | Attending: Family Medicine | Admitting: Family Medicine

## 2022-08-02 DIAGNOSIS — E279 Disorder of adrenal gland, unspecified: Secondary | ICD-10-CM | POA: Insufficient documentation

## 2022-08-02 DIAGNOSIS — K429 Umbilical hernia without obstruction or gangrene: Secondary | ICD-10-CM | POA: Diagnosis not present

## 2022-08-12 ENCOUNTER — Telehealth: Payer: Self-pay | Admitting: Adult Health

## 2022-08-12 NOTE — Telephone Encounter (Signed)
I received the following notification from [place] with [facility]. It looks like the pt wants to do an in-lab study not a HST.  If this is ok, will you please order the in-lab study.    I just wanted to give you an update on patient Jennifer Rich (dob-[birth year]) for your records. We received the original order on 04/29/22, she received the test kit on 05/08/22. We received an email from her on 05/28/22 requesting more time to test she had been on a cruise and just got back, we gave her until 06/12/22. She never returned the test until 07/10/22 and it was blank (no results). We then attempted to reach her again via phone/text with no reply on 07/10/22, 07/14/22, 07/15/22 & again 08/04/22. I myself tried to reach her on 08/04/22 & 08/05/22 with no success. She finally replied back with:    Pt called states she is not comfortable doing it at home sleep test too much going on at home and would prefer in facility. She said she doesn't know why they requested this one again. Advised would send the center a message and cancel this one

## 2022-08-13 NOTE — Telephone Encounter (Signed)
Tammy please advise °

## 2022-08-14 NOTE — Telephone Encounter (Signed)
Can order in lab split night sleep study . Insurance typically will not cover unless they meet certain guidelines. That is why a home sleep study was ordered.  Please place order and we can see if insurance will cover .

## 2022-08-15 ENCOUNTER — Other Ambulatory Visit: Payer: Self-pay

## 2022-08-15 DIAGNOSIS — G4733 Obstructive sleep apnea (adult) (pediatric): Secondary | ICD-10-CM

## 2022-08-15 NOTE — Telephone Encounter (Signed)
ATC X1 LVM for patient to call the office back. Order has been placed for in lab sleep study

## 2022-08-19 NOTE — Telephone Encounter (Signed)
Routing to PCC's to see if the study is covered   If so, please close this encounter out. If not, please let us know, thanks!

## 2022-08-22 NOTE — Telephone Encounter (Signed)
Closing encounter since nothing else is needed.

## 2022-08-22 NOTE — Telephone Encounter (Signed)
No pa req for in lab sleep study  Once she meets her deductible of $6,000 she will be covered for the testing, she currently has $5,000 of her deductible paid.   Pt is aware, I've sent her information to the sleep lab to be scheduled

## 2022-08-25 ENCOUNTER — Other Ambulatory Visit: Payer: Self-pay | Admitting: Family Medicine

## 2022-09-17 ENCOUNTER — Ambulatory Visit (HOSPITAL_BASED_OUTPATIENT_CLINIC_OR_DEPARTMENT_OTHER): Payer: BC Managed Care – PPO | Attending: Adult Health | Admitting: Pulmonary Disease

## 2022-09-26 NOTE — Telephone Encounter (Signed)
Patient no showed her sleep study appt   Calling pt to resch

## 2022-09-26 NOTE — Telephone Encounter (Signed)
Patient is scheduled for 8/28  Called and spoke with her. She is aware of appt

## 2022-10-23 ENCOUNTER — Encounter: Payer: Self-pay | Admitting: Family Medicine

## 2022-10-27 ENCOUNTER — Other Ambulatory Visit: Payer: Self-pay | Admitting: Family Medicine

## 2022-10-27 MED ORDER — OZEMPIC (0.25 OR 0.5 MG/DOSE) 2 MG/3ML ~~LOC~~ SOPN: 0.2500 mg | PEN_INJECTOR | SUBCUTANEOUS | 2 refills | Status: DC

## 2022-10-28 ENCOUNTER — Other Ambulatory Visit (HOSPITAL_COMMUNITY): Payer: Self-pay

## 2022-10-28 ENCOUNTER — Telehealth: Payer: Self-pay

## 2022-10-28 NOTE — Telephone Encounter (Signed)
Pharmacy Patient Advocate Encounter   Received notification from Patient Advice Request messages that prior authorization for Ozempic (0.25 or 0.5 MG/DOSE) 2MG /3ML pen-injectors is required/requested.   Insurance verification completed.   The patient is insured through [employer] .   Per test claim: APPROVED from 09/28/22 to 10/28/23. Ran test claim, Copay is $24.99. This test claim was processed through [facility]- copay amounts may vary at other pharmacies due to pharmacy/plan contracts, or as the patient moves through the different stages of their insurance plan.    KeyVentura Bruns PA #/Case ID/Reference #: [id number]

## 2022-10-29 ENCOUNTER — Ambulatory Visit (HOSPITAL_BASED_OUTPATIENT_CLINIC_OR_DEPARTMENT_OTHER): Payer: BC Managed Care – PPO | Attending: Adult Health | Admitting: Pulmonary Disease

## 2022-11-21 ENCOUNTER — Other Ambulatory Visit: Payer: Self-pay | Admitting: Family Medicine

## 2022-11-21 DIAGNOSIS — R0683 Snoring: Secondary | ICD-10-CM

## 2022-11-24 ENCOUNTER — Encounter: Payer: BC Managed Care – PPO | Admitting: Family Medicine

## 2022-11-26 NOTE — Telephone Encounter (Signed)
Tammy please advise (see telephone encounter from 6/11).

## 2022-11-27 NOTE — Telephone Encounter (Signed)
That is fine, please order home sleep study .

## 2022-11-29 NOTE — Telephone Encounter (Signed)
Order placed for sleep study and patient is aware. Will close encounter.

## 2022-12-04 ENCOUNTER — Encounter: Payer: BC Managed Care – PPO | Admitting: Family Medicine

## 2022-12-06 ENCOUNTER — Other Ambulatory Visit: Payer: Self-pay | Admitting: Family Medicine

## 2022-12-07 NOTE — Assessment & Plan Note (Signed)
hgba1c acceptable, minimize simple carbs. Increase exercise as tolerated. Continue current meds 

## 2022-12-07 NOTE — Assessment & Plan Note (Signed)
Supplement and monitor 

## 2022-12-07 NOTE — Assessment & Plan Note (Addendum)
Monitor with repeat imaging. Last CT 08/2022, stable results no further work up at this time

## 2022-12-07 NOTE — Assessment & Plan Note (Signed)
Encouraged DASH or MIND diet, decrease po intake and increase exercise as tolerated. Needs 7-8 hours of sleep nightly. Avoid trans fats, eat small, frequent meals every 4-5 hours with lean proteins, complex carbs and healthy fats. Minimize simple carbs, high fat foods and processed foods 

## 2022-12-07 NOTE — Assessment & Plan Note (Signed)
Monitor and report any concerns, no changes to meds. Encouraged heart healthy diet such as the DASH diet and exercise as tolerated.  ?

## 2022-12-07 NOTE — Assessment & Plan Note (Addendum)
Patient encouraged to maintain heart healthy diet, regular exercise, adequate sleep. Consider daily probiotics. Take medications as prescribed. Given and reviewed copy of ACP documents from [employer] and encouraged to complete and return. Labs ordered and reviewed  [facility] 2024 needs repeat  Pap Colonoscopy: negative cologuard 2023 repeat in 2026

## 2022-12-09 ENCOUNTER — Encounter: Payer: Self-pay | Admitting: Family Medicine

## 2022-12-09 ENCOUNTER — Encounter: Payer: BC Managed Care – PPO | Admitting: Family Medicine

## 2022-12-09 ENCOUNTER — Ambulatory Visit (INDEPENDENT_AMBULATORY_CARE_PROVIDER_SITE_OTHER): Payer: BC Managed Care – PPO | Admitting: Family Medicine

## 2022-12-09 ENCOUNTER — Other Ambulatory Visit (HOSPITAL_BASED_OUTPATIENT_CLINIC_OR_DEPARTMENT_OTHER): Payer: Self-pay

## 2022-12-09 VITALS — BP 126/76 | HR 90 | Temp 98.0°F | Resp 16 | Ht 66.0 in | Wt 328.2 lb

## 2022-12-09 DIAGNOSIS — E559 Vitamin D deficiency, unspecified: Secondary | ICD-10-CM

## 2022-12-09 DIAGNOSIS — E538 Deficiency of other specified B group vitamins: Secondary | ICD-10-CM

## 2022-12-09 DIAGNOSIS — I10 Essential (primary) hypertension: Secondary | ICD-10-CM

## 2022-12-09 DIAGNOSIS — E278 Other specified disorders of adrenal gland: Secondary | ICD-10-CM | POA: Diagnosis not present

## 2022-12-09 DIAGNOSIS — Z0000 Encounter for general adult medical examination without abnormal findings: Secondary | ICD-10-CM | POA: Diagnosis not present

## 2022-12-09 DIAGNOSIS — E66813 Obesity, class 3: Secondary | ICD-10-CM | POA: Diagnosis not present

## 2022-12-09 DIAGNOSIS — Z6841 Body Mass Index (BMI) 40.0 and over, adult: Secondary | ICD-10-CM

## 2022-12-09 DIAGNOSIS — R3915 Urgency of urination: Secondary | ICD-10-CM

## 2022-12-09 DIAGNOSIS — E1169 Type 2 diabetes mellitus with other specified complication: Secondary | ICD-10-CM

## 2022-12-09 DIAGNOSIS — Z23 Encounter for immunization: Secondary | ICD-10-CM

## 2022-12-09 DIAGNOSIS — K760 Fatty (change of) liver, not elsewhere classified: Secondary | ICD-10-CM

## 2022-12-09 MED ORDER — OZEMPIC (0.25 OR 0.5 MG/DOSE) 2 MG/3ML ~~LOC~~ SOPN: 0.5000 mg | PEN_INJECTOR | SUBCUTANEOUS | 1 refills | Status: DC

## 2022-12-09 MED ORDER — ALPRAZOLAM 0.25 MG PO TABS
0.2500 mg | ORAL_TABLET | Freq: Two times a day (BID) | ORAL | 0 refills | Status: DC | PRN
Start: 2022-12-09 — End: 2023-05-01

## 2022-12-09 MED ORDER — COMIRNATY 30 MCG/0.3ML IM SUSY
0.3000 mL | PREFILLED_SYRINGE | Freq: Once | INTRAMUSCULAR | 0 refills | Status: AC
  Filled 2022-12-09: qty 0.3, 1d supply, fill #0

## 2022-12-09 NOTE — Patient Instructions (Signed)
Preventive Care 40-56 Years Old, Female Preventive care refers to lifestyle choices and visits with your health care provider that can promote health and wellness. Preventive care visits are also called wellness exams. What can I expect for my preventive care visit? Counseling Your health care provider may ask you questions about your: Medical history, including: Past medical problems. Family medical history. Pregnancy history. Current health, including: Menstrual cycle. Method of birth control. Emotional well-being. Home life and relationship well-being. Sexual activity and sexual health. Lifestyle, including: Alcohol, nicotine or tobacco, and drug use. Access to firearms. Diet, exercise, and sleep habits. Work and work environment. Sunscreen use. Safety issues such as seatbelt and bike helmet use. Physical exam Your health care provider will check your: Height and weight. These may be used to calculate your BMI (body mass index). BMI is a measurement that tells if you are at a healthy weight. Waist circumference. This measures the distance around your waistline. This measurement also tells if you are at a healthy weight and may help predict your risk of certain diseases, such as type 2 diabetes and high blood pressure. Heart rate and blood pressure. Body temperature. Skin for abnormal spots. What immunizations do I need?  Vaccines are usually given at various ages, according to a schedule. Your health care provider will recommend vaccines for you based on your age, medical history, and lifestyle or other factors, such as travel or where you work. What tests do I need? Screening Your health care provider may recommend screening tests for certain conditions. This may include: Lipid and cholesterol levels. Diabetes screening. This is done by checking your blood sugar (glucose) after you have not eaten for a while (fasting). Pelvic exam and Pap test. Hepatitis B test. Hepatitis C  test. HIV (human immunodeficiency virus) test. STI (sexually transmitted infection) testing, if you are at risk. Lung cancer screening. Colorectal cancer screening. Mammogram. Talk with your health care provider about when you should start having regular mammograms. This may depend on whether you have a family history of breast cancer. BRCA-related cancer screening. This may be done if you have a family history of breast, ovarian, tubal, or peritoneal cancers. Bone density scan. This is done to screen for osteoporosis. Talk with your health care provider about your test results, treatment options, and if necessary, the need for more tests. Follow these instructions at home: Eating and drinking  Eat a diet that includes fresh fruits and vegetables, whole grains, lean protein, and low-fat dairy products. Take vitamin and mineral supplements as recommended by your health care provider. Do not drink alcohol if: Your health care provider tells you not to drink. You are pregnant, may be pregnant, or are planning to become pregnant. If you drink alcohol: Limit how much you have to 0-1 drink a day. Know how much alcohol is in your drink. In the [place] one drink equals one 12 oz bottle of beer (355 mL), one 5 oz glass of wine (148 mL), or one 1 oz glass of hard liquor (44 mL). Lifestyle Brush your teeth every morning and night with fluoride toothpaste. Floss one time each day. Exercise for at least 30 minutes 5 or more days each week. Do not use any products that contain nicotine or tobacco. These products include cigarettes, chewing tobacco, and vaping devices, such as e-cigarettes. If you need help quitting, ask your health care provider. Do not use drugs. If you are sexually active, practice safe sex. Use a condom or other form of protection to   prevent STIs. If you do not wish to become pregnant, use a form of birth control. If you plan to become pregnant, see your health care provider for a  prepregnancy visit. Take aspirin only as told by your health care provider. Make sure that you understand how much to take and what form to take. Work with your health care provider to find out whether it is safe and beneficial for you to take aspirin daily. Find healthy ways to manage stress, such as: Meditation, yoga, or listening to music. Journaling. Talking to a trusted person. Spending time with friends and family. Minimize exposure to UV radiation to reduce your risk of skin cancer. Safety Always wear your seat belt while driving or riding in a vehicle. Do not drive: If you have been drinking alcohol. Do not ride with someone who has been drinking. When you are tired or distracted. While texting. If you have been using any mind-altering substances or drugs. Wear a helmet and other protective equipment during sports activities. If you have firearms in your house, make sure you follow all gun safety procedures. Seek help if you have been physically or sexually abused. What's next? Visit your health care provider once a year for an annual wellness visit. Ask your health care provider how often you should have your eyes and teeth checked. Stay up to date on all vaccines. This information is not intended to replace advice given to you by your health care provider. Make sure you discuss any questions you have with your health care provider. Document Revised: 08/15/2020 Document Reviewed: 08/15/2020 [employer] Patient Education  2024 [employer]  

## 2022-12-10 LAB — COMPREHENSIVE METABOLIC PANEL
ALT: 8 U/L (ref 0–35)
AST: 14 U/L (ref 0–37)
Albumin: 3.9 g/dL (ref 3.5–5.2)
Alkaline Phosphatase: 95 U/L (ref 39–117)
BUN: 18 mg/dL (ref 6–23)
CO2: 31 meq/L (ref 19–32)
Calcium: 9.7 mg/dL (ref 8.4–10.5)
Chloride: 98 meq/L (ref 96–112)
Creatinine, Ser: 1.16 mg/dL (ref 0.40–1.20)
GFR: 52.62 mL/min — ABNORMAL LOW (ref >60.00)
Glucose, Bld: 92 mg/dL (ref 70–99)
Potassium: 4.1 meq/L (ref 3.5–5.1)
Sodium: 137 meq/L (ref 135–145)
Total Bilirubin: 0.9 mg/dL (ref 0.2–1.2)
Total Protein: 6.9 g/dL (ref 6.0–8.3)

## 2022-12-10 LAB — VITAMIN B12: Vitamin B-12: 453 pg/mL (ref 211–911)

## 2022-12-10 LAB — CBC WITH DIFFERENTIAL/PLATELET
Basophils Absolute: 0.1 10*3/uL (ref 0.0–0.1)
Basophils Relative: 1.5 % (ref 0.0–3.0)
Eosinophils Absolute: 0.1 10*3/uL (ref 0.0–0.7)
Eosinophils Relative: 2.2 % (ref 0.0–5.0)
HCT: 42.1 % (ref 36.0–46.0)
Hemoglobin: 13.3 g/dL (ref 12.0–15.0)
Lymphocytes Relative: 43.9 % (ref 12.0–46.0)
Lymphs Abs: 2.9 10*3/uL (ref 0.7–4.0)
MCHC: 31.6 g/dL (ref 30.0–36.0)
MCV: 77 fL — ABNORMAL LOW (ref 78.0–100.0)
Monocytes Absolute: 0.4 10*3/uL (ref 0.1–1.0)
Monocytes Relative: 6.3 % (ref 3.0–12.0)
Neutro Abs: 3 10*3/uL (ref 1.4–7.7)
Neutrophils Relative %: 46.1 % (ref 43.0–77.0)
Platelets: 225 10*3/uL (ref 150.0–400.0)
RBC: 5.47 Mil/uL — ABNORMAL HIGH (ref 3.87–5.11)
RDW: 15 % (ref 11.5–15.5)
WBC: 6.6 10*3/uL (ref 4.0–10.5)

## 2022-12-10 LAB — TSH: TSH: 1.93 u[IU]/mL (ref 0.35–5.50)

## 2022-12-10 LAB — URINE CULTURE
MICRO NUMBER:: 15567015
Result:: NO GROWTH
SPECIMEN QUALITY:: ADEQUATE

## 2022-12-10 LAB — URINALYSIS, ROUTINE W REFLEX MICROSCOPIC
Bilirubin Urine: NEGATIVE
Hgb urine dipstick: NEGATIVE
Ketones, ur: NEGATIVE
Leukocytes,Ua: NEGATIVE
Nitrite: NEGATIVE
RBC / HPF: NONE SEEN (ref 0–?)
Specific Gravity, Urine: 1.015 (ref 1.000–1.030)
Total Protein, Urine: NEGATIVE
Urine Glucose: NEGATIVE
Urobilinogen, UA: 0.2 (ref 0.0–1.0)
WBC, UA: NONE SEEN (ref 0–?)
pH: 6 (ref 5.0–8.0)

## 2022-12-10 LAB — MICROALBUMIN / CREATININE URINE RATIO
Creatinine,U: 100.9 mg/dL
Microalb Creat Ratio: 0.7 mg/g (ref 0.0–30.0)
Microalb, Ur: <0.7 mg/dL (ref 0.0–1.9)

## 2022-12-10 LAB — HEMOGLOBIN A1C: Hgb A1c MFr Bld: 6.8 % — ABNORMAL HIGH (ref 4.6–6.5)

## 2022-12-10 LAB — VITAMIN D 25 HYDROXY (VIT D DEFICIENCY, FRACTURES): VITD: 21.5 ng/mL — ABNORMAL LOW (ref 30.00–100.00)

## 2022-12-10 LAB — LIPID PANEL
Cholesterol: 209 mg/dL — ABNORMAL HIGH (ref 0–200)
HDL: 42.3 mg/dL (ref >39.00)
LDL Cholesterol: 142 mg/dL — ABNORMAL HIGH (ref 0–99)
NonHDL: 166.78
Total CHOL/HDL Ratio: 5
Triglycerides: 123 mg/dL (ref 0.0–149.0)
VLDL: 24.6 mg/dL (ref 0.0–40.0)

## 2022-12-10 NOTE — Progress Notes (Signed)
Subjective:    Patient ID: Jennifer Rich, female    DOB: 1965/11/10, 57 y.o.   MRN: [id number]  Chief Complaint  Patient presents with  . Annual Exam    Annual Exam     HPI Discussed the use of AI scribe software for clinical note transcription with the patient, who gave verbal consent to proceed.  History of Present Illness   The patient, with a history of diabetes, presents for a routine follow-up. She reports adherence to her medication regimen, including Ozempic, and has made dietary changes to manage her blood sugar levels. The patient admits to occasional indulgence in sweets but has not been regularly monitoring her blood sugar levels at home. She expresses willingness to start using a glucometer for home monitoring. The patient also reports a decrease in her consumption of sweets and an increase in fruit intake. She has noticed a slight weight loss since her last visit.  The patient also mentions a recent episode of left-sided upper back pain and cloudy urine, raising concerns about a possible urinary tract infection. However, she reports that these symptoms have since resolved. The patient also reports a history of an adrenal gland issue, which has been stable for the past three years.  The patient acknowledges the need for dental care but expresses anxiety about dental visits. She has not been regularly visiting a dentist and expresses willingness to address this issue.        Past Medical History:  Diagnosis Date  . Allergy   . Anxiety and depression 01/30/2013  . 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 CAP (community acquired pneumonia) 04/17/2015  . Cervical cancer screening 01/25/2013  . Chicken pox as a child  . Fatty liver   . Goiter    Mildly enlarged thyroid TSH normal   . Hepatitis C    was treated in 2022  . Hyperglycemia   . Hyperlipidemia   . Hypertension   . Obesity   . Obesity, unspecified 04/26/2013  . Pneumonia  05/02/2013  . Preventative health care 12/18/2012   Has seen Dr Neva Seat for GYN in past  . Sleep apnea   . Type II or unspecified type diabetes mellitus without mention of complication, not stated as uncontrolled   . Vitamin D deficiency     Past Surgical History:  Procedure Laterality Date  . WISDOM TOOTH EXTRACTION  57 yrs old    Family History  Problem Relation Age of Onset  . High blood pressure Mother   . High Cholesterol Mother   . High blood pressure Father   . Alcoholism Father   . Hypertension Sister   . Hypertension Sister   . Diabetes Maternal Grandmother        type 2  . Blindness Maternal Grandmother   . Heart attack Maternal Grandfather 75  . Heart disease Maternal Grandfather   . Hypertension Paternal Grandmother   . Stroke Paternal Grandfather   . Colon cancer Neg Hx   . Esophageal cancer Neg Hx   . Stomach cancer Neg Hx     Social History   Socioeconomic History  . Marital status: Married    Spouse name: Not on file  . Number of children: 0  . Years of education: Not on file  . Highest education level: Associate degree: academic program  Occupational History  . Occupation: Surveyor, minerals: [employer]  Tobacco Use  . Smoking status: Never  . Smokeless  tobacco: Never  Vaping Use  . Vaping status: Never Used  Substance and Sexual Activity  . Alcohol use: No  . Drug use: No  . Sexual activity: Yes    Partners: Male    Comment: lives with husband and Wardell, [name], no dietary restrictions.  Other Topics Concern  . Not on file  Social History Narrative   No Pork         Social Determinants of Health   Financial Resource Strain: Low Risk  (07/23/2022)   Overall Financial Resource Strain (CARDIA)   . Difficulty of Paying Living Expenses: Not hard at all  Food Insecurity: No Food Insecurity (07/23/2022)   Hunger Vital Sign   . Worried About Programme researcher, broadcasting/film/video in the Last Year: Never true   . Ran Out of Food in the Last Year:  Never true  Transportation Needs: No Transportation Needs (07/23/2022)   PRAPARE - Transportation   . Lack of Transportation (Medical): No   . Lack of Transportation (Non-Medical): No  Physical Activity: Insufficiently Active (07/23/2022)   Exercise Vital Sign   . Days of Exercise per Week: 2 days   . Minutes of Exercise per Session: 10 min  Stress: Stress Concern Present (07/23/2022)   [employer] of Occupational Health - Occupational Stress Questionnaire   . Feeling of Stress : To some extent  Social Connections: Moderately Integrated (07/23/2022)   Social Connection and Isolation Panel [NHANES]   . Frequency of Communication with Friends and Family: More than three times a week   . Frequency of Social Gatherings with Friends and Family: More than three times a week   . Attends Religious Services: More than 4 times per year   . Active Member of Clubs or Organizations: No   . Attends Banker Meetings: Not on file   . Marital Status: Married  Catering manager Violence: Unknown (06/04/2021)   Received from [facility]   HITS   . Physically Hurt: Not on file   . Insult or Talk Down To: Not on file   . Threaten Physical Harm: Not on file   . Scream or Curse: Not on file    Outpatient Medications Prior to Visit  Medication Sig Dispense Refill  . albuterol (VENTOLIN HFA) 108 (90 Base) MCG/ACT inhaler Inhale 1-2 puffs into the lungs every 6 (six) hours as needed for wheezing or shortness of breath. 1 each 0  . atorvastatin (LIPITOR) 10 MG tablet Take 1 tablet (10 mg total) by mouth daily. 90 tablet 1  . losartan-hydrochlorothiazide (HYZAAR) 100-25 MG tablet TAKE 1 TABLET BY MOUTH EVERY DAY 90 tablet 1  . spironolactone (ALDACTONE) 25 MG tablet Take 1 tablet (25 mg total) by mouth 2 (two) times daily. 180 tablet 0  . valACYclovir (VALTREX) 500 MG tablet Take 500 mg by mouth as needed.    . Vitamin D, Ergocalciferol, (DRISDOL) 1.25 MG (50000 UNIT) CAPS  capsule TAKE 1 CAPSULE (50,000 UNITS TOTAL) BY MOUTH EVERY 7 (SEVEN) DAYS 4 capsule 3  . Semaglutide,0.25 or 0.5MG /DOS, (OZEMPIC, 0.25 OR 0.5 MG/DOSE,) 2 MG/3ML SOPN Inject 0.25 mg into the skin once a week. 3 mL 2   No facility-administered medications prior to visit.    Allergies  Allergen Reactions  . Metoprolol Other (See Comments)  . Lisinopril Cough and Other (See Comments)    Review of Systems  Constitutional:  Negative for chills, fever and malaise/fatigue.  HENT:  Negative for congestion and hearing loss.   Eyes:  Negative for discharge.  Respiratory:  Negative for cough, sputum production and shortness of breath.   Cardiovascular:  Negative for chest pain, palpitations and leg swelling.  Gastrointestinal:  Negative for abdominal pain, blood in stool, constipation, diarrhea, heartburn, nausea and vomiting.  Genitourinary:  Negative for dysuria, frequency, hematuria and urgency.  Musculoskeletal:  Positive for back pain. Negative for falls and myalgias.  Skin:  Negative for rash.  Neurological:  Negative for dizziness, sensory change, loss of consciousness, weakness and headaches.  Endo/Heme/Allergies:  Negative for environmental allergies. Does not bruise/bleed easily.  Psychiatric/Behavioral:  Negative for depression and suicidal ideas. The patient is not nervous/anxious and does not have insomnia.       Objective:    Physical Exam Constitutional:      General: She is not in acute distress.    Appearance: Normal appearance. She is not diaphoretic.  HENT:     Head: Normocephalic and atraumatic.     Right Ear: Tympanic membrane, ear canal and external ear normal.     Left Ear: Tympanic membrane, ear canal and external ear normal.     Nose: Nose normal.     Mouth/Throat:     Mouth: Mucous membranes are moist.     Pharynx: Oropharynx is clear. No oropharyngeal exudate.  Eyes:     General: No scleral icterus.       Right eye: No discharge.        Left eye: No  discharge.     Conjunctiva/sclera: Conjunctivae normal.     Pupils: Pupils are equal, round, and reactive to light.  Neck:     Thyroid: No thyromegaly.  Cardiovascular:     Rate and Rhythm: Normal rate and regular rhythm.     Heart sounds: Normal heart sounds. No murmur heard. Pulmonary:     Effort: Pulmonary effort is normal. No respiratory distress.     Breath sounds: Normal breath sounds. No wheezing or rales.  Abdominal:     General: Bowel sounds are normal. There is no distension.     Palpations: Abdomen is soft. There is no mass.     Tenderness: There is no abdominal tenderness.  Musculoskeletal:        General: No tenderness. Normal range of motion.     Cervical back: Normal range of motion and neck supple.  Lymphadenopathy:     Cervical: No cervical adenopathy.  Skin:    General: Skin is warm and dry.     Findings: No rash.  Neurological:     General: No focal deficit present.     Mental Status: She is alert and oriented to person, place, and time.     Cranial Nerves: No cranial nerve deficit.     Coordination: Coordination normal.     Deep Tendon Reflexes: Reflexes are normal and symmetric. Reflexes normal.  Psychiatric:        Mood and Affect: Mood normal.        Behavior: Behavior normal.        Thought Content: Thought content normal.        Judgment: Judgment normal.   BP 126/76 (BP Location: Left Arm, Patient Position: Sitting, Cuff Size: Large)   Pulse 90   Temp 98 F (36.7 C) (Oral)   Resp 16   Ht 5\' 6"  (1.676 m)   Wt (!) 328 lb 3.2 oz (148.9 kg)   SpO2 97%   BMI 52.97 kg/m  Wt Readings from Last 3 Encounters:  12/09/22 (!) 328 lb 3.2  oz (148.9 kg)  07/24/22 (!) 329 lb 3.2 oz (149.3 kg)  07/07/22 (!) 315 lb (142.9 kg)    Diabetic Foot Exam - Simple   No data filed    Lab Results  Component Value Date   WBC 6.6 12/09/2022   HGB 13.3 12/09/2022   HCT 42.1 12/09/2022   PLT 225.0 12/09/2022   GLUCOSE 92 12/09/2022   CHOL 209 (H) 12/09/2022    TRIG 123.0 12/09/2022   HDL 42.30 12/09/2022   LDLCALC 142 (H) 12/09/2022   ALT 8 12/09/2022   AST 14 12/09/2022   NA 137 12/09/2022   K 4.1 12/09/2022   CL 98 12/09/2022   CREATININE 1.16 12/09/2022   BUN 18 12/09/2022   CO2 31 12/09/2022   TSH 1.93 12/09/2022   HGBA1C 6.8 (H) 12/09/2022   MICROALBUR <0.7 12/09/2022    Lab Results  Component Value Date   TSH 1.93 12/09/2022   Lab Results  Component Value Date   WBC 6.6 12/09/2022   HGB 13.3 12/09/2022   HCT 42.1 12/09/2022   MCV 77.0 (L) 12/09/2022   PLT 225.0 12/09/2022   Lab Results  Component Value Date   NA 137 12/09/2022   K 4.1 12/09/2022   CO2 31 12/09/2022   GLUCOSE 92 12/09/2022   BUN 18 12/09/2022   CREATININE 1.16 12/09/2022   BILITOT 0.9 12/09/2022   ALKPHOS 95 12/09/2022   AST 14 12/09/2022   ALT 8 12/09/2022   PROT 6.9 12/09/2022   ALBUMIN 3.9 12/09/2022   CALCIUM 9.7 12/09/2022   ANIONGAP 7 04/06/2022   EGFR 62 11/18/2021   GFR 52.62 (L) 12/09/2022   Lab Results  Component Value Date   CHOL 209 (H) 12/09/2022   Lab Results  Component Value Date   HDL 42.30 12/09/2022   Lab Results  Component Value Date   LDLCALC 142 (H) 12/09/2022   Lab Results  Component Value Date   TRIG 123.0 12/09/2022   Lab Results  Component Value Date   CHOLHDL 5 12/09/2022   Lab Results  Component Value Date   HGBA1C 6.8 (H) 12/09/2022       Assessment & Plan:  Type 2 diabetes mellitus with obesity (HCC) Assessment & Plan: hgba1c acceptable, minimize simple carbs. Increase exercise as tolerated. 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 Last CT 08/2022, stable results no further work up at this time   Fatty liver Assessment & Plan: Encouraged DASH or MIND diet, decrease po intake and increase exercise as tolerated. Needs  7-8 hours of sleep nightly. Avoid trans fats, eat small, frequent meals every 4-5 hours with lean proteins, complex carbs and healthy fats. Minimize simple carbs, high fat foods and processed foods    Essential hypertension, benign Assessment & Plan: Monitor and report any concerns, no changes to meds. Encouraged heart healthy diet such as the DASH diet and exercise as tolerated.   Orders: -     CBC with Differential/Platelet -     Comprehensive metabolic panel -     TSH  Class 3 severe obesity with serious comorbidity and body mass index (BMI) of 50.0 to 59.9 in adult, unspecified obesity type [facility]) Assessment & Plan: Encouraged DASH or MIND diet, decrease po intake and increase exercise  as tolerated. Needs 7-8 hours of sleep nightly. Avoid trans fats, eat small, frequent meals every 4-5 hours with lean proteins, complex carbs and healthy fats. Minimize simple carbs, high fat foods and processed foods    Vitamin B 12 deficiency Assessment & Plan: Supplement and monitor   Orders: -     Vitamin B12  Vitamin D deficiency Assessment & Plan: Supplement and monitor   Orders: -     Microalbumin / creatinine urine ratio -     VITAMIN D 25 Hydroxy (Vit-D Deficiency, Fractures)  Preventative health care Assessment & Plan: Patient encouraged to maintain heart healthy diet, regular exercise, adequate sleep. Consider daily probiotics. Take medications as prescribed. Given and reviewed copy of ACP documents from [employer] and encouraged to complete and return. Labs ordered and reviewed  MGM 2024 needs repeat  Pap Colonoscopy: negative cologuard 2023 repeat in 2026    Urinary urgency -     Urinalysis, Routine w reflex microscopic -     Urine Culture  Need for influenza vaccination -     Flu vaccine trivalent PF, 6mos and older(Flulaval,Afluria,Fluarix,Fluzone)  Other orders -     Ozempic (0.25 or 0.5 MG/DOSE); Inject 0.5 mg into the skin once a week.  Dispense: 3 mL;  Refill: 1 -     ALPRAZolam; Take 1 tablet (0.25 mg total) by mouth 2 (two) times daily as needed for anxiety.  Dispense: 10 tablet; Refill: 0    Assessment and Plan    Diabetes Mellitus Recently started on Ozempic 0.25mg , not currently monitoring blood glucose at home. No reported side effects. -Initiate home glucose monitoring with glucometer, test strips, and lancets. -Advise to check fasting glucose once a week and postprandial glucose once a week after largest meal. -Increase Ozempic to 0.5mg  with next refill, with potential to increase to 1mg  if well-tolerated and patient still experiencing cravings. -Order A1C, microalbuminuria, and B12 levels. -Referral to nutritionist for dietary counseling. -Advise to keep food diary and pair carbohydrates with protein or healthy fats.  Urinary Symptoms Recent episode of urinary urgency and cloudy urine, now resolved. -Collect urine sample today for urinalysis to rule out infection.  Adrenal Mass Stable on recent imaging, no further workup needed at this time.  Dental Anxiety Reports anxiety related to dental visits, impacting oral health maintenance. -Prescribe Alprazolam 0.25mg , 10 tablets, to be taken prior to dental appointments. -Encourage regular dental visits for oral health maintenance.  General Health Maintenance -Continue with current vaccination schedule. -Repeat Cologuard in 2026. -Obtain copy of recent Pap smear results. -Continue with annual/biannual mammograms. -Advise to take multivitamin with minerals and fish oil daily.  Enlarged Liver and Spleen Identified on recent imaging. -Continue to monitor blood pressure to prevent further organ enlargement. -Plan for regular follow-up to monitor organ size and function.         Danise Edge, MD

## 2022-12-11 ENCOUNTER — Other Ambulatory Visit: Payer: Self-pay

## 2022-12-11 ENCOUNTER — Other Ambulatory Visit (HOSPITAL_BASED_OUTPATIENT_CLINIC_OR_DEPARTMENT_OTHER): Payer: Self-pay

## 2022-12-11 MED ORDER — VITAMIN D (ERGOCALCIFEROL) 1.25 MG (50000 UNIT) PO CAPS: 50000.0000 [IU] | ORAL_CAPSULE | ORAL | 3 refills | Status: DC

## 2022-12-11 MED ORDER — VITAMIN D (ERGOCALCIFEROL) 1.25 MG (50000 UNIT) PO CAPS
50000.0000 [IU] | ORAL_CAPSULE | ORAL | 3 refills | Status: DC
Start: 2022-12-11 — End: 2022-12-11
  Filled 2022-12-11: qty 4, 28d supply, fill #0

## 2022-12-12 ENCOUNTER — Other Ambulatory Visit (HOSPITAL_BASED_OUTPATIENT_CLINIC_OR_DEPARTMENT_OTHER): Payer: Self-pay

## 2022-12-15 ENCOUNTER — Encounter (HOSPITAL_BASED_OUTPATIENT_CLINIC_OR_DEPARTMENT_OTHER): Payer: Self-pay

## 2022-12-15 ENCOUNTER — Emergency Department (HOSPITAL_BASED_OUTPATIENT_CLINIC_OR_DEPARTMENT_OTHER): Payer: BC Managed Care – PPO

## 2022-12-15 ENCOUNTER — Emergency Department (HOSPITAL_BASED_OUTPATIENT_CLINIC_OR_DEPARTMENT_OTHER)
Admission: EM | Admit: 2022-12-15 | Discharge: 2022-12-16 | Disposition: A | Discharge status: Home / Self Care | Payer: BC Managed Care – PPO | Attending: Emergency Medicine | Admitting: Emergency Medicine

## 2022-12-15 ENCOUNTER — Other Ambulatory Visit: Payer: Self-pay

## 2022-12-15 DIAGNOSIS — Z79899 Other long term (current) drug therapy: Secondary | ICD-10-CM | POA: Diagnosis not present

## 2022-12-15 DIAGNOSIS — R0789 Other chest pain: Secondary | ICD-10-CM | POA: Diagnosis not present

## 2022-12-15 DIAGNOSIS — E1165 Type 2 diabetes mellitus with hyperglycemia: Secondary | ICD-10-CM | POA: Diagnosis not present

## 2022-12-15 DIAGNOSIS — R739 Hyperglycemia, unspecified: Secondary | ICD-10-CM | POA: Diagnosis not present

## 2022-12-15 DIAGNOSIS — R079 Chest pain, unspecified: Secondary | ICD-10-CM | POA: Diagnosis not present

## 2022-12-15 LAB — CBG MONITORING, ED: Glucose-Capillary: 127 mg/dL — ABNORMAL HIGH (ref 70–99)

## 2022-12-15 LAB — CBC
HCT: 42.2 % (ref 36.0–46.0)
Hemoglobin: 13.1 g/dL (ref 12.0–15.0)
MCH: 24.3 pg — ABNORMAL LOW (ref 26.0–34.0)
MCHC: 31 g/dL (ref 30.0–36.0)
MCV: 78.1 fL — ABNORMAL LOW (ref 80.0–100.0)
Platelets: 223 10*3/uL (ref 150–400)
RBC: 5.4 MIL/uL — ABNORMAL HIGH (ref 3.87–5.11)
RDW: 14.6 % (ref 11.5–15.5)
WBC: 7.3 10*3/uL (ref 4.0–10.5)
nRBC: 0 % (ref 0.0–0.2)

## 2022-12-15 MED ORDER — ALUM & MAG HYDROXIDE-SIMETH 200-200-20 MG/5ML PO SUSP
30.0000 mL | Freq: Once | ORAL | Status: AC
  Administered 2022-12-15: 30 mL via ORAL
  Filled 2022-12-15: qty 30

## 2022-12-15 MED ORDER — LIDOCAINE VISCOUS HCL 2 % MT SOLN
15.0000 mL | Freq: Once | OROMUCOSAL | Status: AC
  Administered 2022-12-15: 15 mL via OROMUCOSAL
  Filled 2022-12-15: qty 15

## 2022-12-15 NOTE — ED Triage Notes (Signed)
Pt states her blood sugar has been 300+ since this morning. C/o of lightheadedness earlier but denies now. States she takes ozempic. Reports chest pain but thinks its anxiety. A&O x4

## 2022-12-15 NOTE — ED Provider Notes (Signed)
[facility] Provider Note   CSN: [id number] Arrival date & time: 12/15/22  2240     History {Add pertinent medical, surgical, social history, OB history to HPI:1} Chief Complaint  Patient presents with   Hyperglycemia    Jennifer Rich is a 57 y.o. female.  Patient with a history of hypertension and diabetes is here with elevated blood sugar.  States she was recently diagnosed with diabetes and takes Ozempic once a week.  No other diabetic medications.  States her sugars normally in the 200 range but is all day today has been over 300.  No change in her diet or medications.  She feels well otherwise.  No dizziness or lightheadedness.  No abdominal pain, nausea, vomiting, cough, fever, pain with urination or blood in the urine.  She has had some dull left-sided chest pain since approximately 5 PM after eating [place] food.  This is been constant.  No radiation of pain to her arms, neck or back.  No shortness of breath, nausea, vomiting, diaphoresis reported sweating.  Denies cardiac history.  Pain not exertional or pleuritic.  No history of DVT or PE.  The history is provided by the patient.  Hyperglycemia Associated symptoms: no abdominal pain, no chest pain, no dizziness, no dysuria, no fever, no nausea, no shortness of breath, no vomiting and no weakness        Home Medications Prior to Admission medications   Medication Sig Start Date End Date Taking? Authorizing Provider  albuterol (VENTOLIN HFA) 108 (90 Base) MCG/ACT inhaler Inhale 1-2 puffs into the lungs every 6 (six) hours as needed for wheezing or shortness of breath. 01/22/21   Derwood Kaplan, MD  ALPRAZolam Prudy Feeler) 0.25 MG tablet Take 1 tablet (0.25 mg total) by mouth 2 (two) times daily as needed for anxiety. 12/09/22   Bradd Canary, MD  atorvastatin (LIPITOR) 10 MG tablet Take 1 tablet (10 mg total) by mouth daily. 07/30/22   Bradd Canary, MD   losartan-hydrochlorothiazide (HYZAAR) 100-25 MG tablet TAKE 1 TABLET BY MOUTH EVERY DAY 08/25/22   Bradd Canary, MD  Semaglutide,0.25 or 0.5MG /DOS, (OZEMPIC, 0.25 OR 0.5 MG/DOSE,) 2 MG/3ML SOPN Inject 0.5 mg into the skin once a week. 12/09/22   Bradd Canary, MD  spironolactone (ALDACTONE) 25 MG tablet Take 1 tablet (25 mg total) by mouth 2 (two) times daily. 11/21/22   Bradd Canary, MD  valACYclovir (VALTREX) 500 MG tablet Take 500 mg by mouth as needed. 05/20/21   [provider]  Vitamin D, Ergocalciferol, (DRISDOL) 1.25 MG (50000 UNIT) CAPS capsule Take 1 capsule (50,000 Units total) by mouth every 7 (seven) days. 12/11/22   Bradd Canary, MD      Allergies    Metoprolol and Lisinopril    Review of Systems   Review of Systems  Constitutional:  Negative for activity change, appetite change and fever.  HENT:  Negative for congestion and rhinorrhea.   Respiratory:  Positive for chest tightness. Negative for shortness of breath.   Cardiovascular:  Negative for chest pain.  Gastrointestinal:  Negative for abdominal pain, nausea and vomiting.  Genitourinary:  Negative for dysuria and hematuria.  Musculoskeletal:  Negative for arthralgias and myalgias.  Skin:  Negative for rash.  Neurological:  Negative for dizziness, weakness and headaches.   all other systems are negative except as noted in the HPI and PMH. \  Physical Exam Updated Vital Signs BP (!) 173/94 (BP Location: Left  Arm)   Pulse 83   Temp 98 F (36.7 C)   Resp 18   Ht 5\' 7"  (1.702 m)   Wt (!) 148.8 kg   SpO2 98%   BMI 51.37 kg/m  Physical Exam Vitals and nursing note reviewed.  Constitutional:      General: She is not in acute distress.    Appearance: She is well-developed.  HENT:     Head: Normocephalic and atraumatic.     Mouth/Throat:     Pharynx: No oropharyngeal exudate.  Eyes:     Conjunctiva/sclera: Conjunctivae normal.     Pupils: Pupils are equal, round, and reactive to light.  Neck:      Comments: No meningismus. Cardiovascular:     Rate and Rhythm: Normal rate and regular rhythm.     Heart sounds: Normal heart sounds. No murmur heard. Pulmonary:     Effort: Pulmonary effort is normal. No respiratory distress.     Breath sounds: Normal breath sounds.  Abdominal:     Palpations: Abdomen is soft.     Tenderness: There is no abdominal tenderness. There is no guarding or rebound.  Musculoskeletal:        General: No tenderness. Normal range of motion.     Cervical back: Normal range of motion and neck supple.  Skin:    General: Skin is warm.  Neurological:     Mental Status: She is alert and oriented to person, place, and time.     Cranial Nerves: No cranial nerve deficit.     Motor: No abnormal muscle tone.     Coordination: Coordination normal.     Comments:  5/5 strength throughout. CN 2-12 intact.Equal grip strength.   Psychiatric:        Behavior: Behavior normal.     ED Results / Procedures / Treatments   Labs (all labs ordered are listed, but only abnormal results are displayed) Labs Reviewed  CBG MONITORING, ED - Abnormal; Notable for the following components:      Result Value   Glucose-Capillary 127 (*)    All other components within normal limits  BASIC METABOLIC PANEL  CBC  PREGNANCY, URINE  URINALYSIS, ROUTINE W REFLEX MICROSCOPIC  TROPONIN I (HIGH SENSITIVITY)    EKG EKG Interpretation Date/Time:  Monday December 15 2022 23:09:53 EDT Ventricular Rate:  89 PR Interval:  152 QRS Duration:  96 QT Interval:  375 QTC Calculation: 457 R Axis:   23  Text Interpretation: Sinus rhythm Low voltage, precordial leads Borderline T abnormalities, anterior leads No significant change was found Confirmed by Glynn Octave [phone]) on 12/15/2022 11:29:43 PM  Radiology No results found.  Procedures Procedures  {Document cardiac monitor, telemetry assessment procedure when appropriate:1}  Medications Ordered in ED Medications  alum & mag  hydroxide-simeth (MAALOX/MYLANTA) 200-200-20 MG/5ML suspension 30 mL (has no administration in time range)  lidocaine (XYLOCAINE) 2 % viscous mouth solution 15 mL (has no administration in time range)    ED Course/ Medical Decision Making/ A&P   {   Click here for ABCD2, HEART and other calculatorsREFRESH Note before signing :1}                              Medical Decision Making Amount and/or Complexity of Data Reviewed Labs: ordered. Decision-making details documented in ED Course. Radiology: ordered and independent interpretation performed. Decision-making details documented in ED Course. ECG/medicine tests: ordered and independent interpretation performed. Decision-making details documented in  ED Course.  Risk OTC drugs. Prescription drug management.   Elevated blood Sugar with history of diabetes. Chest pain since 5 PM.  EKG without acute ischemia.  Will check labs rule out DKA. {Document critical care time when appropriate:1} {Document review of labs and clinical decision tools ie heart score, Chads2Vasc2 etc:1}  {Document your independent review of radiology images, and any outside records:1} {Document your discussion with family members, caretakers, and with consultants:1} {Document social determinants of health affecting pt's care:1} {Document your decision making why or why not admission, treatments were needed:1} Final Clinical Impression(s) / ED Diagnoses Final diagnoses:  None    Rx / DC Orders ED Discharge Orders     None

## 2022-12-16 ENCOUNTER — Encounter: Payer: Self-pay | Admitting: Family Medicine

## 2022-12-16 LAB — URINALYSIS, ROUTINE W REFLEX MICROSCOPIC
Bilirubin Urine: NEGATIVE
Glucose, UA: NEGATIVE mg/dL
Hgb urine dipstick: NEGATIVE
Ketones, ur: NEGATIVE mg/dL
Leukocytes,Ua: NEGATIVE
Nitrite: NEGATIVE
Protein, ur: NEGATIVE mg/dL
Specific Gravity, Urine: 1.01 (ref 1.005–1.030)
pH: 6 (ref 5.0–8.0)

## 2022-12-16 LAB — BASIC METABOLIC PANEL
Anion gap: 10 (ref 5–15)
BUN: 21 mg/dL — ABNORMAL HIGH (ref 6–20)
CO2: 29 mmol/L (ref 22–32)
Calcium: 9 mg/dL (ref 8.9–10.3)
Chloride: 98 mmol/L (ref 98–111)
Creatinine, Ser: 1.5 mg/dL — ABNORMAL HIGH (ref 0.44–1.00)
GFR, Estimated: 41 mL/min — ABNORMAL LOW (ref >60)
Glucose, Bld: 131 mg/dL — ABNORMAL HIGH (ref 70–99)
Potassium: 3.5 mmol/L (ref 3.5–5.1)
Sodium: 137 mmol/L (ref 135–145)

## 2022-12-16 LAB — TROPONIN I (HIGH SENSITIVITY)
Troponin I (High Sensitivity): 4 ng/L (ref <18)
Troponin I (High Sensitivity): 4 ng/L (ref <18)

## 2022-12-16 LAB — CBG MONITORING, ED: Glucose-Capillary: 140 mg/dL — ABNORMAL HIGH (ref 70–99)

## 2022-12-16 LAB — PREGNANCY, URINE: Preg Test, Ur: NEGATIVE

## 2022-12-16 MED ORDER — FREESTYLE TEST VI STRP: ORAL_STRIP | 12 refills | Status: AC

## 2022-12-16 MED ORDER — FREESTYLE LIBRE 3 SENSOR MISC: 2 refills | Status: AC

## 2022-12-16 MED ORDER — FREESTYLE LANCETS MISC: 12 refills | Status: AC

## 2022-12-16 MED ORDER — OMEPRAZOLE 20 MG PO CPDR: 20.0000 mg | DELAYED_RELEASE_CAPSULE | Freq: Every day | ORAL | 0 refills | Status: AC

## 2022-12-16 NOTE — Discharge Instructions (Signed)
Keep a close record of your blood sugar and follow-up with your doctor for medication adjustments.  Your testing today is negative for heart attack.  Take the stomach medication as prescribed avoid alcohol, caffeine, NSAID medications and spicy foods.  Return to the ED with exertional chest pain, pain associate with shortness of breath, nausea, vomiting, sweating or other concerns

## 2022-12-31 ENCOUNTER — Other Ambulatory Visit (HOSPITAL_BASED_OUTPATIENT_CLINIC_OR_DEPARTMENT_OTHER): Payer: Self-pay

## 2022-12-31 MED ORDER — FREESTYLE LIBRE 3 READER DEVI
0 refills | Status: AC
  Filled 2022-12-31: qty 1, 28d supply, fill #0

## 2022-12-31 NOTE — Addendum Note (Signed)
Addended by: Kathi Ludwig on: 12/31/2022 02:26 PM   Modules accepted: Orders

## 2023-01-13 ENCOUNTER — Other Ambulatory Visit (HOSPITAL_BASED_OUTPATIENT_CLINIC_OR_DEPARTMENT_OTHER): Payer: Self-pay

## 2023-01-20 ENCOUNTER — Encounter: Payer: BC Managed Care – PPO | Admitting: Family Medicine

## 2023-01-26 ENCOUNTER — Other Ambulatory Visit: Payer: Self-pay | Admitting: Family Medicine

## 2023-02-02 ENCOUNTER — Ambulatory Visit: Payer: BC Managed Care – PPO | Admitting: Dietician

## 2023-03-28 ENCOUNTER — Other Ambulatory Visit: Payer: Self-pay | Admitting: Family Medicine

## 2023-05-01 ENCOUNTER — Other Ambulatory Visit (HOSPITAL_BASED_OUTPATIENT_CLINIC_OR_DEPARTMENT_OTHER): Payer: Self-pay

## 2023-05-01 ENCOUNTER — Ambulatory Visit: Payer: BC Managed Care – PPO | Admitting: Family Medicine

## 2023-05-01 VITALS — BP 133/79 | HR 92 | Ht 67.0 in | Wt 331.0 lb

## 2023-05-01 DIAGNOSIS — R718 Other abnormality of red blood cells: Secondary | ICD-10-CM

## 2023-05-01 DIAGNOSIS — Z6841 Body Mass Index (BMI) 40.0 and over, adult: Secondary | ICD-10-CM | POA: Diagnosis not present

## 2023-05-01 DIAGNOSIS — Z7985 Long-term (current) use of injectable non-insulin antidiabetic drugs: Secondary | ICD-10-CM | POA: Diagnosis not present

## 2023-05-01 DIAGNOSIS — F419 Anxiety disorder, unspecified: Secondary | ICD-10-CM

## 2023-05-01 DIAGNOSIS — E559 Vitamin D deficiency, unspecified: Secondary | ICD-10-CM | POA: Diagnosis not present

## 2023-05-01 DIAGNOSIS — E669 Obesity, unspecified: Secondary | ICD-10-CM | POA: Diagnosis not present

## 2023-05-01 DIAGNOSIS — E1169 Type 2 diabetes mellitus with other specified complication: Secondary | ICD-10-CM | POA: Diagnosis not present

## 2023-05-01 DIAGNOSIS — E7849 Other hyperlipidemia: Secondary | ICD-10-CM | POA: Diagnosis not present

## 2023-05-01 DIAGNOSIS — I10 Essential (primary) hypertension: Secondary | ICD-10-CM | POA: Diagnosis not present

## 2023-05-01 DIAGNOSIS — F32A Depression, unspecified: Secondary | ICD-10-CM

## 2023-05-01 LAB — CBC WITH DIFFERENTIAL/PLATELET
Basophils Absolute: 0 10*3/uL (ref 0.0–0.1)
Basophils Relative: 0.4 % (ref 0.0–3.0)
Eosinophils Absolute: 0.2 10*3/uL (ref 0.0–0.7)
Eosinophils Relative: 2.8 % (ref 0.0–5.0)
HCT: 41.5 % (ref 36.0–46.0)
Hemoglobin: 13.3 g/dL (ref 12.0–15.0)
Lymphocytes Relative: 38.4 % (ref 12.0–46.0)
Lymphs Abs: 2.4 10*3/uL (ref 0.7–4.0)
MCHC: 32 g/dL (ref 30.0–36.0)
MCV: 77.4 fL — ABNORMAL LOW (ref 78.0–100.0)
Monocytes Absolute: 0.4 10*3/uL (ref 0.1–1.0)
Monocytes Relative: 7.3 % (ref 3.0–12.0)
Neutro Abs: 3.1 10*3/uL (ref 1.4–7.7)
Neutrophils Relative %: 51.1 % (ref 43.0–77.0)
Platelets: 212 10*3/uL (ref 150.0–400.0)
RBC: 5.37 Mil/uL — ABNORMAL HIGH (ref 3.87–5.11)
RDW: 15.3 % (ref 11.5–15.5)
WBC: 6.1 10*3/uL (ref 4.0–10.5)

## 2023-05-01 LAB — COMPREHENSIVE METABOLIC PANEL
ALT: 9 U/L (ref 0–35)
AST: 14 U/L (ref 0–37)
Albumin: 3.8 g/dL (ref 3.5–5.2)
Alkaline Phosphatase: 85 U/L (ref 39–117)
BUN: 23 mg/dL (ref 6–23)
CO2: 28 meq/L (ref 19–32)
Calcium: 9.2 mg/dL (ref 8.4–10.5)
Chloride: 102 meq/L (ref 96–112)
Creatinine, Ser: 1.3 mg/dL — ABNORMAL HIGH (ref 0.40–1.20)
GFR: 45.77 mL/min — ABNORMAL LOW (ref >60.00)
Glucose, Bld: 138 mg/dL — ABNORMAL HIGH (ref 70–99)
Potassium: 4.2 meq/L (ref 3.5–5.1)
Sodium: 139 meq/L (ref 135–145)
Total Bilirubin: 0.8 mg/dL (ref 0.2–1.2)
Total Protein: 7.3 g/dL (ref 6.0–8.3)

## 2023-05-01 LAB — VITAMIN D 25 HYDROXY (VIT D DEFICIENCY, FRACTURES): VITD: 25.19 ng/mL — ABNORMAL LOW (ref 30.00–100.00)

## 2023-05-01 LAB — LIPID PANEL
Cholesterol: 198 mg/dL (ref 0–200)
HDL: 39.8 mg/dL (ref >39.00)
LDL Cholesterol: 139 mg/dL — ABNORMAL HIGH (ref 0–99)
NonHDL: 157.77
Total CHOL/HDL Ratio: 5
Triglycerides: 93 mg/dL (ref 0.0–149.0)
VLDL: 18.6 mg/dL (ref 0.0–40.0)

## 2023-05-01 LAB — HEMOGLOBIN A1C: Hgb A1c MFr Bld: 6.8 % — ABNORMAL HIGH (ref 4.6–6.5)

## 2023-05-01 LAB — TSH: TSH: 2.36 u[IU]/mL (ref 0.35–5.50)

## 2023-05-01 MED ORDER — ATORVASTATIN CALCIUM 10 MG PO TABS
10.0000 mg | ORAL_TABLET | Freq: Every day | ORAL | 1 refills | Status: DC
  Filled 2023-05-01: qty 90, 90d supply, fill #0

## 2023-05-01 MED ORDER — SPIRONOLACTONE 25 MG PO TABS
25.0000 mg | ORAL_TABLET | Freq: Two times a day (BID) | ORAL | 0 refills | Status: DC
  Filled 2023-05-01: qty 180, 90d supply, fill #0

## 2023-05-01 MED ORDER — VITAMIN D (ERGOCALCIFEROL) 1.25 MG (50000 UNIT) PO CAPS
50000.0000 [IU] | ORAL_CAPSULE | ORAL | 3 refills | Status: DC
  Filled 2023-05-01: qty 4, 28d supply, fill #0

## 2023-05-01 MED ORDER — ALPRAZOLAM 0.25 MG PO TABS
0.2500 mg | ORAL_TABLET | Freq: Two times a day (BID) | ORAL | 0 refills | Status: AC | PRN
  Filled 2023-05-01: qty 10, 5d supply, fill #0

## 2023-05-01 MED ORDER — LOSARTAN POTASSIUM-HCTZ 100-25 MG PO TABS
1.0000 | ORAL_TABLET | Freq: Every day | ORAL | 1 refills | Status: AC
  Filled 2023-05-01: qty 90, 90d supply, fill #0
  Filled 2023-05-01: qty 30, 30d supply, fill #0

## 2023-05-01 NOTE — Progress Notes (Signed)
 Acute Office Visit  Subjective:     Patient ID: Jennifer Rich, female    DOB: 12-09-1965, 58 y.o.   MRN: [id number]  Chief Complaint  Patient presents with   Hypertension    HPI Patient is in today for med refills.    Discussed the use of AI scribe software for clinical note transcription with the patient, who gave verbal consent to proceed.  History of Present Illness Jennifer Rich is a 58 year old female with hypertension and diabetes who presents for medication refills.  She is here for a blood pressure medication refill. Her home blood pressure readings are typically around 140/80 mmHg. She is currently taking a combination of losartan-hydrochlorothiazide 100-25 mg and spironolactone 25 mg. She has run out of her medications and needs a refill to continue managing her blood pressure effectively. No chest pain or symptoms suggestive of elevated blood pressure recently.  She has been taking Ozempic for diabetes management but has not taken it for the past month due to the high cost, which she found unaffordable. She has not been on any diabetes medication for the past month.  She requires a refill of Xanax, which she uses for situational anxiety, such as flying or dental visits. She is preparing for a work-related trip next week and needs the medication to manage her anxiety during the flight. She mentions that she will be traveling to [place], and has been advised to carry a doctor's note for her medications due to travel regulations.  She is taking vitamin D once a week but is unsure if she needs a refill. She does not take vitamin B12.  She works in Doctor, hospital, specifically in internet and telephone services, and describes her work environment as very busy. She is the top performer in her department and is preparing for a company-sponsored trip to [place] as a reward. She works from home, which provides some flexibility in her schedule.        ROS All  review of systems negative except what is listed in the HPI      Objective:    BP 133/79   Pulse 92   Ht 5\' 7"  (1.702 m)   Wt (!) 331 lb (150.1 kg)   SpO2 97%   BMI 51.84 kg/m    Physical Exam Vitals reviewed.  Constitutional:      Appearance: Normal appearance. She is obese.  Cardiovascular:     Rate and Rhythm: Normal rate and regular rhythm.  Musculoskeletal:     Right lower leg: No edema.     Left lower leg: No edema.  Neurological:     Mental Status: She is alert and oriented to person, place, and time.  Psychiatric:        Mood and Affect: Mood normal.        Behavior: Behavior normal.        Thought Content: Thought content normal.        Judgment: Judgment normal.     No results found for any visits on 05/01/23.      Assessment & Plan:   Problem List Items Addressed This Visit       Active Problems   Hyperlipidemia - Primary   -Medication management: continue Lipitor -Labs today -Diet low in saturated fat -Regular exercise - at least 30 minutes, 5 times per week       Relevant Medications   atorvastatin (LIPITOR) 10 MG tablet   losartan-hydrochlorothiazide (HYZAAR) 100-25 MG tablet  spironolactone (ALDACTONE) 25 MG tablet   Other Relevant Orders   Comprehensive metabolic panel   Lipid panel   Vitamin D deficiency   Continue supplementation Labs today      Relevant Medications   Vitamin D, Ergocalciferol, (DRISDOL) 1.25 MG (50000 UNIT) CAPS capsule   Other Relevant Orders   VITAMIN D 25 Hydroxy (Vit-D Deficiency, Fractures)   Anxiety and depression   Situational anxiety related to upcoming work trip and flight.  -Refill Xanax  for use during flight       Relevant Medications   ALPRAZolam (XANAX) 0.25 MG tablet   Essential hypertension, benign   Blood pressure is at goal for age and co-morbidities.   Recommendations: continue current regimen - BP goal <130/80 - monitor and log blood pressures at home - check around the same time  each day in a relaxed setting - Limit salt to <2000 mg/day - Follow DASH eating plan (heart healthy diet) - limit alcohol to 2 standard drinks per day for men and 1 per day for women - avoid tobacco products - get at least 2 hours of regular aerobic exercise weekly Patient aware of signs/symptoms requiring further/urgent evaluation. Labs updated today.       Relevant Medications   atorvastatin (LIPITOR) 10 MG tablet   losartan-hydrochlorothiazide (HYZAAR) 100-25 MG tablet   spironolactone (ALDACTONE) 25 MG tablet   Other Relevant Orders   CBC with Differential/Platelet   Comprehensive metabolic panel   TSH   Type 2 diabetes mellitus with obesity (HCC)   Patient has been off Ozempic for a month due to high cost. No recent A1c available. -Advise patient to contact insurance to inquire about preferred GLP-1 agonist. -Check A1c today and adjust meds accordingly       Relevant Medications   atorvastatin (LIPITOR) 10 MG tablet   losartan-hydrochlorothiazide (HYZAAR) 100-25 MG tablet   Other Relevant Orders   Hemoglobin A1c    Meds ordered this encounter  Medications   ALPRAZolam (XANAX) 0.25 MG tablet    Sig: Take 1 tablet (0.25 mg total) by mouth 2 (two) times daily as needed for anxiety.    Dispense:  10 tablet    Refill:  0    Supervising Provider:   Danise Edge A [[id number]]   atorvastatin (LIPITOR) 10 MG tablet    Sig: Take 1 tablet (10 mg total) by mouth daily.    Dispense:  90 tablet    Refill:  1    Supervising Provider:   Danise Edge A [[id number]]   losartan-hydrochlorothiazide (HYZAAR) 100-25 MG tablet    Sig: Take 1 tablet by mouth daily.    Dispense:  90 tablet    Refill:  1    Supervising Provider:   Danise Edge A [[id number]]   spironolactone (ALDACTONE) 25 MG tablet    Sig: Take 1 tablet (25 mg total) by mouth 2 (two) times daily.    Dispense:  180 tablet    Refill:  0    Supervising Provider:   Danise Edge A [[id number]]   Vitamin D, Ergocalciferol, (DRISDOL) 1.25 MG  (50000 UNIT) CAPS capsule    Sig: Take 1 capsule (50,000 Units total) by mouth every 7 (seven) days.    Dispense:  4 capsule    Refill:  3    Supervising Provider:   Danise Edge A [[id number]]    Return in about 3 months (around 07/29/2023) for routine follow-up.  Clayborne Dana, NP

## 2023-05-01 NOTE — Assessment & Plan Note (Signed)
Continue supplementation. Labs today  

## 2023-05-01 NOTE — Assessment & Plan Note (Signed)
Blood pressure is at goal for age and co-morbidities.   Recommendations: continue current regimen - BP goal <130/80 - monitor and log blood pressures at home - check around the same time each day in a relaxed setting - Limit salt to <2000 mg/day - Follow DASH eating plan (heart healthy diet) - limit alcohol to 2 standard drinks per day for men and 1 per day for women - avoid tobacco products - get at least 2 hours of regular aerobic exercise weekly Patient aware of signs/symptoms requiring further/urgent evaluation. Labs updated today.  

## 2023-05-01 NOTE — Assessment & Plan Note (Signed)
 Patient has been off Ozempic for a month due to high cost. No recent A1c available. -Advise patient to contact insurance to inquire about preferred GLP-1 agonist. -Check A1c today and adjust meds accordingly

## 2023-05-01 NOTE — Assessment & Plan Note (Signed)
 Situational anxiety related to upcoming work trip and flight.  -Refill Xanax  for use during flight

## 2023-05-01 NOTE — Assessment & Plan Note (Signed)
-  Medication management: continue Lipitor -Labs today -Diet low in saturated fat -Regular exercise - at least 30 minutes, 5 times per week

## 2023-05-04 ENCOUNTER — Encounter: Payer: Self-pay | Admitting: Family Medicine

## 2023-05-04 ENCOUNTER — Other Ambulatory Visit

## 2023-05-04 DIAGNOSIS — R718 Other abnormality of red blood cells: Secondary | ICD-10-CM

## 2023-05-04 NOTE — Progress Notes (Signed)
**  MCV is still low - I'm going to check your iron levels Metabolic panel is close to baseline. Stay well hydrated.  A1c is the same - let me know if your insurance has better coverage for a different GLP-1 (Mounjaro, Trulicity, etc) so we can switch. LDL cholesterol is still elevated - continue Lipitor and lifestyle measures Vitamin D is still low but improving - continue the weekly high-dose supplement and also start a daily 1,000 units D3 over-the-counter.   The 10-year ASCVD risk score (Arnett DK, et al., 2019) is: 18.4%   Values used to calculate the score:     Age: 58 years     Sex: Female     Is Non-Hispanic African American: Yes     Diabetic: Yes     Tobacco smoker: No     Systolic Blood Pressure: 133 mmHg     Is BP treated: Yes     HDL Cholesterol: 39.8 mg/dL     Total Cholesterol: 198 mg/dL

## 2023-05-04 NOTE — Addendum Note (Signed)
 Addended by: Hyman Hopes B on: 05/04/2023 08:09 AM   Modules accepted: Orders

## 2023-05-04 NOTE — Addendum Note (Signed)
 Addended by: Clearnce Sorrel on: 05/04/2023 05:17 PM   Modules accepted: Orders

## 2023-05-05 ENCOUNTER — Encounter: Payer: Self-pay | Admitting: Family Medicine

## 2023-05-05 LAB — IRON, TOTAL/TOTAL IRON BINDING CAP
%SAT: 17 % (ref 16–45)
Iron: 53 ug/dL (ref 45–160)
TIBC: 311 ug/dL (ref 250–450)

## 2023-05-05 LAB — FERRITIN: Ferritin: 135 ng/mL (ref 16–232)

## 2023-06-05 ENCOUNTER — Encounter: Payer: Self-pay | Admitting: Medical

## 2023-06-05 ENCOUNTER — Ambulatory Visit: Admitting: Internal Medicine

## 2023-06-05 ENCOUNTER — Encounter: Payer: Self-pay | Admitting: Family Medicine

## 2023-06-05 ENCOUNTER — Ambulatory Visit: Admitting: Medical

## 2023-06-05 VITALS — BP 138/80 | HR 88 | Temp 98.2°F | Ht 67.0 in | Wt 335.0 lb

## 2023-06-05 DIAGNOSIS — R062 Wheezing: Secondary | ICD-10-CM

## 2023-06-05 DIAGNOSIS — H1013 Acute atopic conjunctivitis, bilateral: Secondary | ICD-10-CM

## 2023-06-05 DIAGNOSIS — R051 Acute cough: Secondary | ICD-10-CM | POA: Diagnosis not present

## 2023-06-05 DIAGNOSIS — R059 Cough, unspecified: Secondary | ICD-10-CM

## 2023-06-05 DIAGNOSIS — J301 Allergic rhinitis due to pollen: Secondary | ICD-10-CM | POA: Diagnosis not present

## 2023-06-05 LAB — POC COVID19 BINAXNOW: SARS Coronavirus 2 Ag: NEGATIVE

## 2023-06-05 LAB — POCT INFLUENZA A/B
Influenza A, POC: NEGATIVE
Influenza B, POC: NEGATIVE

## 2023-06-05 MED ORDER — OLOPATADINE HCL 0.1 % OP SOLN: 1.0000 [drp] | Freq: Two times a day (BID) | OPHTHALMIC | 12 refills | Status: DC

## 2023-06-05 MED ORDER — ALBUTEROL SULFATE HFA 108 (90 BASE) MCG/ACT IN AERS: 2.0000 | INHALATION_SPRAY | Freq: Four times a day (QID) | RESPIRATORY_TRACT | 0 refills | Status: DC | PRN

## 2023-06-05 MED ORDER — BENZONATATE 100 MG PO CAPS: 100.0000 mg | ORAL_CAPSULE | Freq: Three times a day (TID) | ORAL | 0 refills | Status: DC | PRN

## 2023-06-05 MED ORDER — AZELASTINE HCL 0.1 % NA SOLN: 2.0000 | Freq: Two times a day (BID) | NASAL | 1 refills | Status: AC

## 2023-06-05 NOTE — Progress Notes (Signed)
 Subjective:    Patient ID: Jennifer Rich, female    DOB: [birth year], 59 y.o.   MRN: [id number]  HPI Discussed the use of AI scribe software for clinical note transcription with the patient, who gave verbal consent to proceed.  History of Present Illness   Jennifer Rich is a 58 year old female with mild asthma and allergies who presents with respiratory symptoms and sore throat.  She has been experiencing respiratory symptoms and a sore throat for two days, with worsening symptoms since yesterday. Initially, she had sneezing and mild coughing, consistent with her usual allergies. Today, her voice has worsened, and she developed a sore throat. She took ibuprofen this morning for relief.  Her cough is productive with yellow phlegm. She experiences chills but no fever or body aches. She has postnasal drainage and occasional wheezing, although she denies wheezing except for a brief episode when getting into the car.  She has a history of mild asthma and allergies, regularly taking Zyrtec, which she finds effective. She has not used an inhaler in about a year, and her albuterol inhaler is expired. Her eyes are itching, which she attributes to her allergies, describing a combination of allergic conjunctivitis and rhinitis.           Review of Systems  Constitutional:  Positive for chills. Negative for fatigue.  HENT:  Positive for congestion, postnasal drip, sneezing and sore throat.   Respiratory:  Positive for cough and wheezing. Negative for chest tightness.   Cardiovascular:  Negative for chest pain and palpitations.  Genitourinary:  Negative for dysuria and flank pain.  Musculoskeletal:  Negative for back pain and myalgias.  Neurological:  Negative for dizziness and light-headedness.  Hematological:  Negative for adenopathy. Does not bruise/bleed easily.  Psychiatric/Behavioral:  Negative for behavioral problems and confusion.     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   BLASTOCYSTIS HOMINIS     CAP (community acquired pneumonia) 04/17/2015   Cervical cancer screening 01/25/2013   Chicken pox as a child   Fatty liver    Goiter    Mildly enlarged thyroid TSH normal    Hepatitis C    was treated in 2022   Hyperglycemia    Hyperlipidemia    Hypertension    Obesity    Obesity, unspecified 04/26/2013   Pneumonia 05/02/2013   Preventative health care 12/18/2012   Has seen Dr Neva Seat for GYN in past   Sleep apnea    Type II or unspecified type diabetes mellitus without mention of complication, not stated as uncontrolled    Vitamin D deficiency      Social History   Socioeconomic History   Marital status: Married    Spouse name: Not on file   Number of children: 0   Years of education: Not on file   Highest education level: Associate degree: occupational, Scientist, product/process development, or vocational program  Occupational History   Occupation: Surveyor, minerals: [employer]  Tobacco Use   Smoking status: Never   Smokeless tobacco: Never  Vaping Use   Vaping status: Never Used  Substance and Sexual Activity   Alcohol use: No   Drug use: No   Sexual activity: Yes    Partners: Male    Comment: lives with husband and [place], [place], no dietary restrictions.  Other Topics Concern   Not on file  Social History Narrative  No Pork         Social Drivers of Corporate investment banker Strain: Low Risk  (05/01/2023)   Overall Financial Resource Strain (CARDIA)    Difficulty of Paying Living Expenses: Not hard at all  Food Insecurity: No Food Insecurity (05/01/2023)   Hunger Vital Sign    Worried About Running Out of Food in the Last Year: Never true    Ran Out of Food in the Last Year: Never true  Transportation Needs: No Transportation Needs (05/01/2023)   PRAPARE - Administrator, Civil Service (Medical): No    Lack of Transportation  (Non-Medical): No  Physical Activity: Insufficiently Active (05/01/2023)   Exercise Vital Sign    Days of Exercise per Week: 1 day    Minutes of Exercise per Session: 10 min  Stress: Stress Concern Present (05/01/2023)   [employer] of Occupational Health - Occupational Stress Questionnaire    Feeling of Stress : To some extent  Social Connections: Socially Integrated (05/01/2023)   Social Connection and Isolation Panel [NHANES]    Frequency of Communication with Friends and Family: More than three times a week    Frequency of Social Gatherings with Friends and Family: More than three times a week    Attends Religious Services: More than 4 times per year    Active Member of [employer] or Organizations: Yes    Attends Banker Meetings: Patient declined    Marital Status: Married  Catering manager Violence: Unknown (06/04/2021)   Received from [facility], [facility]   HITS    Physically Hurt: Not on file    Insult or Talk Down To: Not on file    Threaten Physical Harm: Not on file    Scream or Curse: Not on file    Past Surgical History:  Procedure Laterality Date   WISDOM TOOTH EXTRACTION  58 yrs old    Family History  Problem Relation Age of Onset   High blood pressure Mother    High Cholesterol Mother    High blood pressure Father    Alcoholism Father    Hypertension Sister    Hypertension Sister    Diabetes Maternal Grandmother        type 2   Blindness Maternal Grandmother    Heart attack Maternal Grandfather 75   Heart disease Maternal Grandfather    Hypertension Paternal Grandmother    Stroke Paternal Grandfather    Colon cancer Neg Hx    Esophageal cancer Neg Hx    Stomach cancer Neg Hx     Allergies  Allergen Reactions   Metoprolol Other (See Comments)   Lisinopril Cough and Other (See Comments)    Current Outpatient Medications on File Prior to Visit  Medication Sig Dispense Refill   albuterol (VENTOLIN HFA) 108 (90 Base) MCG/ACT  inhaler Inhale 1-2 puffs into the lungs every 6 (six) hours as needed for wheezing or shortness of breath. 1 each 0   ALPRAZolam (XANAX) 0.25 MG tablet Take 1 tablet (0.25 mg total) by mouth 2 (two) times daily as needed for anxiety. 10 tablet 0   atorvastatin (LIPITOR) 10 MG tablet Take 1 tablet (10 mg total) by mouth daily. 90 tablet 1   Continuous Glucose Receiver (FREESTYLE LIBRE 3 READER) DEVI Check blood sugar 1 each 0   Continuous Glucose Sensor (FREESTYLE LIBRE 3 SENSOR) MISC Place 1 sensor on the skin every 14 days. Use to check glucose continuously 2 each 2   glucose  blood (FREESTYLE TEST STRIPS) test strip Use as instructed 100 each 12   Lancets (FREESTYLE) lancets Use as instructed 100 each 12   losartan-hydrochlorothiazide (HYZAAR) 100-25 MG tablet Take 1 tablet by mouth daily. 90 tablet 1   omeprazole (PRILOSEC) 20 MG capsule Take 1 capsule (20 mg total) by mouth daily. 30 capsule 0   Semaglutide,0.25 or 0.5MG /DOS, (OZEMPIC, 0.25 OR 0.5 MG/DOSE,) 2 MG/3ML SOPN Inject 0.5 mg into the skin once a week. 1.5 mL 3   spironolactone (ALDACTONE) 25 MG tablet Take 1 tablet (25 mg total) by mouth 2 (two) times daily. 180 tablet 0   valACYclovir (VALTREX) 500 MG tablet Take 500 mg by mouth as needed.     Vitamin D, Ergocalciferol, (DRISDOL) 1.25 MG (50000 UNIT) CAPS capsule Take 1 capsule (50,000 Units total) by mouth every 7 (seven) days. 4 capsule 3   No current facility-administered medications on file prior to visit.    BP (!) 145/47   Pulse (!) 102   Temp 98.2 F (36.8 C) (Oral)   Ht 5\' 7"  (1.702 m)   Wt (!) 335 lb (152 kg)   SpO2 98%   BMI 52.47 kg/m   138/80     Objective:   Physical Exam  General Mental Status- Alert. General Appearance- Not in acute distress.   Skin General: Color- Normal Color. Moisture- Normal Moisture.  Neck Carotid Arteries- Normal color. Moisture- Normal Moisture. No carotid bruits. No JVD.  Chest and Lung Exam Auscultation: Breath  Sounds:-CTA  Cardiovascular Auscultation:Rythm- RRR Murmurs & Other Heart Sounds:Auscultation of the heart reveals- No Murmurs.  Abdomen Inspection:-Inspeection Normal. Palpation/Percussion:Note:No mass. Palpation and Percussion of the abdomen reveal- Non Tender, Non Distended + BS, no rebound or guarding.   Neurologic Cranial Nerve exam:- CN III-XII intact(No nystagmus), symmetric smile. Strength:- 5/5 equal and symmetric strength both upper and lower extremities.   Heent- boggy turbinates. +pnd. No sinus pressure. Normal pharynx.    Assessment & Plan:   Assessment and Plan Patient Instructions  Allergic rhinitis and conjunctivitis Symptoms consistent with allergic rhinitis and conjunctivitis. Zyrtec effective, Flonase not effective. Astelin nasal spray and Patanol eye drops may provide relief. - Continue Zyrtec. - Prescribed Astelin nasal spray, two sprays in each nostril twice a day. - Prescribed Patanol eye drops, two drops in each eye twice a day.   Acute bronchitis Productive cough with yellow phlegm and chills, likely acute bronchitis exacerbated by allergies. COVID-19 and flu tests negative. - Prescribed benzonatate for cough. - Refilled albuterol inhaler. - Advised use of albuterol as needed for wheezing. - Instructed to update on Monday if symptoms worsen, such as increased chest congestion or sinus pressure.  You had mentioned using antibiotic but presently use of antibiotics not indicated though if symptoms worsen as discussed then let me know. In that event could sent in azithromycin. - Advised over-the-counter COVID-19 and flu tests if diffuse body aches develop(then retest with otc test) and update results if positive.  Follow up in 7 days or sooner if needed

## 2023-06-06 NOTE — Patient Instructions (Signed)
 Allergic rhinitis and conjunctivitis Symptoms consistent with allergic rhinitis and conjunctivitis. Zyrtec effective, Flonase not effective. Astelin nasal spray and Patanol eye drops may provide relief. - Continue Zyrtec. - Prescribed Astelin nasal spray, two sprays in each nostril twice a day. - Prescribed Patanol eye drops, two drops in each eye twice a day.   Acute bronchitis Productive cough with yellow phlegm and chills, likely acute bronchitis exacerbated by allergies. COVID-19 and flu tests negative. - Prescribed benzonatate for cough. - Refilled albuterol inhaler. - Advised use of albuterol as needed for wheezing. - Instructed to update on Monday if symptoms worsen, such as increased chest congestion or sinus pressure.  You had mentioned using antibiotic but presently use of antibiotics not indicated though if symptoms worsen as discussed then let me know. In that event could sent in azithromycin. - Advised over-the-counter COVID-19 and flu tests if diffuse body aches develop(then retest with otc test) and update results if positive.  Follow up in 7 days or sooner if needed

## 2023-06-08 ENCOUNTER — Encounter: Payer: Self-pay | Admitting: Medical

## 2023-06-09 ENCOUNTER — Ambulatory Visit (HOSPITAL_BASED_OUTPATIENT_CLINIC_OR_DEPARTMENT_OTHER)
Admission: RE | Admit: 2023-06-09 | Discharge: 2023-06-09 | Disposition: A | Discharge status: Home / Self Care | Source: Ambulatory Visit | Attending: Medical | Admitting: Medical

## 2023-06-09 ENCOUNTER — Encounter: Payer: Self-pay | Admitting: Medical

## 2023-06-09 ENCOUNTER — Encounter (HOSPITAL_BASED_OUTPATIENT_CLINIC_OR_DEPARTMENT_OTHER): Payer: Self-pay | Admitting: Radiology

## 2023-06-09 DIAGNOSIS — R059 Cough, unspecified: Secondary | ICD-10-CM | POA: Insufficient documentation

## 2023-06-09 MED ORDER — AZITHROMYCIN 250 MG PO TABS: ORAL_TABLET | ORAL | 0 refills | Status: AC

## 2023-06-09 MED ORDER — OLOPATADINE HCL 0.1 % OP SOLN: 1.0000 [drp] | Freq: Two times a day (BID) | OPHTHALMIC | 12 refills | Status: AC

## 2023-06-09 NOTE — Addendum Note (Signed)
 Addended by: Gwenevere Abbot on: 06/09/2023 05:08 PM   Modules accepted: Orders

## 2023-06-09 NOTE — Addendum Note (Signed)
 Addended by: Gwenevere Abbot on: 06/09/2023 12:24 PM   Modules accepted: Orders

## 2023-06-09 NOTE — Telephone Encounter (Signed)
 Copied from CRM [id number]. Topic: Clinical - Request for Lab/Test Order >> Jun 09, 2023 11:17 AM Denese Killings wrote: Reason for CRM: Patient is requesting an order and to schedule for a chest xray per Dr. Alvira Monday.

## 2023-06-11 ENCOUNTER — Emergency Department (HOSPITAL_BASED_OUTPATIENT_CLINIC_OR_DEPARTMENT_OTHER)
Admission: EM | Admit: 2023-06-11 | Discharge: 2023-06-11 | Disposition: A | Discharge status: Home / Self Care | Attending: Emergency Medicine | Admitting: Emergency Medicine

## 2023-06-11 ENCOUNTER — Encounter (HOSPITAL_BASED_OUTPATIENT_CLINIC_OR_DEPARTMENT_OTHER): Payer: Self-pay | Admitting: Emergency Medicine

## 2023-06-11 ENCOUNTER — Other Ambulatory Visit: Payer: Self-pay

## 2023-06-11 DIAGNOSIS — Z79899 Other long term (current) drug therapy: Secondary | ICD-10-CM | POA: Diagnosis not present

## 2023-06-11 DIAGNOSIS — X500XXA Overexertion from strenuous movement or load, initial encounter: Secondary | ICD-10-CM | POA: Diagnosis not present

## 2023-06-11 DIAGNOSIS — S39012A Strain of muscle, fascia and tendon of lower back, initial encounter: Secondary | ICD-10-CM | POA: Diagnosis not present

## 2023-06-11 DIAGNOSIS — S3992XA Unspecified injury of lower back, initial encounter: Secondary | ICD-10-CM | POA: Diagnosis present

## 2023-06-11 MED ORDER — CYCLOBENZAPRINE HCL 10 MG PO TABS: 10.0000 mg | ORAL_TABLET | Freq: Two times a day (BID) | ORAL | 0 refills | Status: DC | PRN

## 2023-06-11 NOTE — ED Triage Notes (Signed)
 C/o lower back pain after sneezing. States she feels like she pulled something last night. Denies urinary issues.

## 2023-06-11 NOTE — ED Provider Notes (Signed)
 [facility] Provider Note   CSN: [id number] Arrival date & time: 06/11/23  1126     History  Chief Complaint  Patient presents with   Back Pain    Jennifer Rich is a 58 y.o. female.  58 year old female who presents emergency department with right-sided back pain.  Patient reports that she is getting over a cold and has been coughing frequently.  Reports that she was coughing yesterday and thinks that she strained her right lower back.  Worsened with movement.  No bowel or bladder incontinence.  No urinary symptoms.  No radiation down the legs.  No back surgery.  Says that her respiratory illness is improving and that she has follow-up tomorrow with her primary doctor about it.       Home Medications Prior to Admission medications   Medication Sig Start Date End Date Taking? Authorizing Provider  cyclobenzaprine (FLEXERIL) 10 MG tablet Take 1 tablet (10 mg total) by mouth 2 (two) times daily as needed for muscle spasms. 06/11/23  Yes Rondel Baton, MD  albuterol (VENTOLIN HFA) 108 (90 Base) MCG/ACT inhaler Inhale 1-2 puffs into the lungs every 6 (six) hours as needed for wheezing or shortness of breath. 01/22/21   Derwood Kaplan, MD  albuterol (VENTOLIN HFA) 108 (90 Base) MCG/ACT inhaler Inhale 2 puffs into the lungs every 6 (six) hours as needed. 06/05/23   Saguier, Ramon Dredge, PA-C  ALPRAZolam Prudy Feeler) 0.25 MG tablet Take 1 tablet (0.25 mg total) by mouth 2 (two) times daily as needed for anxiety. 05/01/23   Clayborne Dana, NP  atorvastatin (LIPITOR) 10 MG tablet Take 1 tablet (10 mg total) by mouth daily. 05/01/23   Clayborne Dana, NP  azelastine (ASTELIN) 0.1 % nasal spray Place 2 sprays into both nostrils 2 (two) times daily. Use in each nostril as directed 06/05/23   Saguier, Ramon Dredge, PA-C  azithromycin [facility]) 250 MG tablet Take 2 tablets on day 1, then 1 tablet daily on days 2 through 5 06/09/23 06/14/23  Saguier, Ramon Dredge, PA-C   benzonatate (TESSALON) 100 MG capsule Take 1 capsule (100 mg total) by mouth 3 (three) times daily as needed for cough. 06/05/23   Saguier, Ramon Dredge, PA-C  Continuous Glucose Receiver (FREESTYLE LIBRE 3 READER) DEVI Check blood sugar 12/31/22   Bradd Canary, MD  Continuous Glucose Sensor (FREESTYLE LIBRE 3 SENSOR) MISC Place 1 sensor on the skin every 14 days. Use to check glucose continuously 12/16/22   Bradd Canary, MD  glucose blood (FREESTYLE TEST STRIPS) test strip Use as instructed 12/16/22   Bradd Canary, MD  Lancets (FREESTYLE) lancets Use as instructed 12/16/22   Bradd Canary, MD  losartan-hydrochlorothiazide (HYZAAR) 100-25 MG tablet Take 1 tablet by mouth daily. 05/01/23   Clayborne Dana, NP  olopatadine (PATANOL) 0.1 % ophthalmic solution Place 1 drop into both eyes 2 (two) times daily. 06/09/23   Saguier, Ramon Dredge, PA-C  omeprazole (PRILOSEC) 20 MG capsule Take 1 capsule (20 mg total) by mouth daily. 12/16/22   Rancour, Jeannett Senior, MD  Semaglutide,0.25 or 0.5MG /DOS, (OZEMPIC, 0.25 OR 0.5 MG/DOSE,) 2 MG/3ML SOPN Inject 0.5 mg into the skin once a week. 01/26/23   Bradd Canary, MD  spironolactone (ALDACTONE) 25 MG tablet Take 1 tablet (25 mg total) by mouth 2 (two) times daily. 05/01/23   Clayborne Dana, NP  valACYclovir (VALTREX) 500 MG tablet Take 500 mg by mouth as needed. 05/20/21   [provider]  Vitamin D, Ergocalciferol, (DRISDOL) 1.25 MG (50000 UNIT) CAPS capsule Take 1 capsule (50,000 Units total) by mouth every 7 (seven) days. 05/01/23   Clayborne Dana, NP      Allergies    Metoprolol and Lisinopril    Review of Systems   Review of Systems  Physical Exam Updated Vital Signs BP (!) 176/94 (BP Location: Left Wrist)   Pulse 98   Temp 97.8 F (36.6 C) (Oral)   Resp 16   LMP 09/14/2017   SpO2 100%  Physical Exam Vitals and nursing note reviewed.  Constitutional:      General: She is not in acute distress.    Appearance: She is well-developed.  HENT:      Head: Normocephalic and atraumatic.     Right Ear: External ear normal.     Left Ear: External ear normal.     Nose: Nose normal.  Eyes:     Extraocular Movements: Extraocular movements intact.     Conjunctiva/sclera: Conjunctivae normal.     Pupils: Pupils are equal, round, and reactive to light.  Cardiovascular:     Rate and Rhythm: Normal rate and regular rhythm.     Heart sounds: No murmur heard. Pulmonary:     Effort: Pulmonary effort is normal. No respiratory distress.     Breath sounds: Normal breath sounds.  Musculoskeletal:     Cervical back: Normal range of motion and neck supple.     Right lower leg: No edema.     Left lower leg: No edema.     Comments: No spinal midline TTP in cervical, thoracic, or lumbar spine.  Tenderness to palpation of right superior gluteus.  No stepoffs noted.   Motor: Muscle bulk and tone are normal. Strength is 5/5 in hip flexion, knee flexion and extension, ankle dorsiflexion and plantar flexion bilaterally. Full strength of great toe dorsiflexion bilaterally.  Sensory: Intact sensation to light touch in L2 though S1 dermatomes bilaterally.   Reflexes: Patellar 2+ bilaterally, Achilles 2+ bilaterally, no ankle clonus bilaterally  Skin:    General: Skin is warm and dry.  Neurological:     Mental Status: She is alert and oriented to person, place, and time. Mental status is at baseline.  Psychiatric:        Mood and Affect: Mood normal.     ED Results / Procedures / Treatments   Labs (all labs ordered are listed, but only abnormal results are displayed) Labs Reviewed - No data to display  EKG None  Radiology DG Chest 2 View Result Date: 06/09/2023 CLINICAL DATA:  Four day history of cough EXAM: CHEST - 2 VIEW COMPARISON:  Chest radiograph dated 12/15/2022 FINDINGS: Low lung volumes with bronchovascular crowding. Bibasilar patchy and linear opacities. No pleural effusion or pneumothorax. Enlarged cardiomediastinal silhouette is likely  projectional. No acute osseous abnormality. IMPRESSION: Low lung volumes with bronchovascular crowding. Bibasilar patchy and linear opacities, likely atelectasis. Electronically Signed   By: Agustin Cree M.D.   On: 06/09/2023 16:31    Procedures Procedures    Medications Ordered in ED Medications - No data to display  ED Course/ Medical Decision Making/ A&P                                 Medical Decision Making Risk Prescription drug management.   VANNAH NADAL is a 58 y.o. female with recent URI who presents emergency department with right-sided back pain  Initial Ddx:  Lumbar radiculopathy, spinal cord compression, pathologic fracture, spinal epidural abscess, spinal epidural hematoma  MDM:  Feel the patient likely has a muscle strain from coughing given her symptoms.  No radiation that would suggest radiculopathy.   No signs or symptoms of cord compression such as bowel or bladder incontinence or numbness or weakness that would warrant MRI.  Low risk for pathologic fracture so do not feel that imaging is warranted at this time.  No risk factors for spinal epidural abscess or spinal epidural hematoma either.  Overall well-appearing.  Low concern for pneumonia at this time.  Suspect she has a URI she is recovering from.  Also is following up with her primary doctor tomorrow about it.  Plan:  Symptomatic treatment Spine clinic follow-up  This patient presents to the ED for concern of complaints listed in HPI, this involves an extensive number of treatment options, and is a complaint that carries with it a high risk of complications and morbidity. Disposition including potential need for admission considered.   Dispo: DC Home. Return precautions discussed including, but not limited to, those listed in the AVS. Allowed pt time to ask questions which were answered fully prior to dc.  Records reviewed Outpatient Clinic Notes I have reviewed the patients home medications and made  adjustments as needed   Final Clinical Impression(s) / ED Diagnoses Final diagnoses:  Strain of lumbar region, initial encounter    Rx / DC Orders ED Discharge Orders          Ordered    cyclobenzaprine (FLEXERIL) 10 MG tablet  2 times daily PRN        06/11/23 1330              Rondel Baton, MD 06/11/23 1426

## 2023-06-11 NOTE — Discharge Instructions (Addendum)
You were seen for your back pain in the emergency department.   At home, please use over-the-counter Tylenol and lidocaine patches.  You may also use the cyclobenzaprine we have prescribed you as needed for pain.  Do not take the cyclobenzaprine before driving or operating heavy machinery.  Follow-up with your primary doctor in 2-3 days regarding your visit.  Follow-up with the spine clinic as soon as possible regarding your symptoms.  Return immediately to the emergency department if you experience any of the following: Numbness or weakness of your legs, bowel or bladder incontinence, numbness while wiping after pooping or urinating, or any other concerning symptoms.    Thank you for visiting our Emergency Department. It was a pleasure taking care of you today.

## 2023-06-12 ENCOUNTER — Ambulatory Visit: Admitting: Family Medicine

## 2023-06-15 ENCOUNTER — Ambulatory Visit: Admitting: Medical

## 2023-06-20 ENCOUNTER — Emergency Department (HOSPITAL_BASED_OUTPATIENT_CLINIC_OR_DEPARTMENT_OTHER)

## 2023-06-20 ENCOUNTER — Encounter (HOSPITAL_BASED_OUTPATIENT_CLINIC_OR_DEPARTMENT_OTHER): Payer: Self-pay

## 2023-06-20 ENCOUNTER — Other Ambulatory Visit: Payer: Self-pay

## 2023-06-20 ENCOUNTER — Emergency Department (HOSPITAL_BASED_OUTPATIENT_CLINIC_OR_DEPARTMENT_OTHER)
Admission: EM | Admit: 2023-06-20 | Discharge: 2023-06-20 | Disposition: A | Discharge status: Home / Self Care | Attending: Emergency Medicine | Admitting: Emergency Medicine

## 2023-06-20 DIAGNOSIS — I10 Essential (primary) hypertension: Secondary | ICD-10-CM | POA: Insufficient documentation

## 2023-06-20 DIAGNOSIS — E119 Type 2 diabetes mellitus without complications: Secondary | ICD-10-CM | POA: Insufficient documentation

## 2023-06-20 DIAGNOSIS — R519 Headache, unspecified: Secondary | ICD-10-CM | POA: Diagnosis present

## 2023-06-20 DIAGNOSIS — Z79899 Other long term (current) drug therapy: Secondary | ICD-10-CM | POA: Diagnosis not present

## 2023-06-20 LAB — BASIC METABOLIC PANEL WITH GFR
Anion gap: 9 (ref 5–15)
BUN: 22 mg/dL — ABNORMAL HIGH (ref 6–20)
CO2: 25 mmol/L (ref 22–32)
Calcium: 8.9 mg/dL (ref 8.9–10.3)
Chloride: 101 mmol/L (ref 98–111)
Creatinine, Ser: 1.23 mg/dL — ABNORMAL HIGH (ref 0.44–1.00)
GFR, Estimated: 51 mL/min — ABNORMAL LOW (ref >60)
Glucose, Bld: 148 mg/dL — ABNORMAL HIGH (ref 70–99)
Potassium: 4.4 mmol/L (ref 3.5–5.1)
Sodium: 135 mmol/L (ref 135–145)

## 2023-06-20 LAB — CBC
HCT: 40.9 % (ref 36.0–46.0)
Hemoglobin: 13 g/dL (ref 12.0–15.0)
MCH: 24.8 pg — ABNORMAL LOW (ref 26.0–34.0)
MCHC: 31.8 g/dL (ref 30.0–36.0)
MCV: 77.9 fL — ABNORMAL LOW (ref 80.0–100.0)
Platelets: 202 10*3/uL (ref 150–400)
RBC: 5.25 MIL/uL — ABNORMAL HIGH (ref 3.87–5.11)
RDW: 15.1 % (ref 11.5–15.5)
WBC: 5.7 10*3/uL (ref 4.0–10.5)
nRBC: 0 % (ref 0.0–0.2)

## 2023-06-20 MED ORDER — DEXAMETHASONE SODIUM PHOSPHATE 10 MG/ML IJ SOLN
10.0000 mg | Freq: Once | INTRAMUSCULAR | Status: AC
  Administered 2023-06-20: 10 mg via INTRAVENOUS
  Filled 2023-06-20: qty 1

## 2023-06-20 MED ORDER — KETOROLAC TROMETHAMINE 15 MG/ML IJ SOLN
15.0000 mg | Freq: Once | INTRAMUSCULAR | Status: AC
  Administered 2023-06-20: 15 mg via INTRAVENOUS
  Filled 2023-06-20: qty 1

## 2023-06-20 MED ORDER — PROCHLORPERAZINE EDISYLATE 10 MG/2ML IJ SOLN
10.0000 mg | Freq: Once | INTRAMUSCULAR | Status: AC
  Administered 2023-06-20: 10 mg via INTRAVENOUS
  Filled 2023-06-20: qty 2

## 2023-06-20 MED ORDER — DIPHENHYDRAMINE HCL 50 MG/ML IJ SOLN
12.5000 mg | Freq: Once | INTRAMUSCULAR | Status: AC
  Administered 2023-06-20: 12.5 mg via INTRAVENOUS
  Filled 2023-06-20: qty 1

## 2023-06-20 NOTE — ED Provider Notes (Signed)
 Procedure note: Ultrasound Guided Peripheral IV Ultrasound guided peripheral 1.88 inch angiocath IV placement performed by me. Indications: Nursing unable to place IV. Details: The antecubital fossa and upper arm were evaluated with a multifrequency linear probe. Patent brachial veins were noted. 1 attempt was made to cannulate a vein under realtime US  guidance with successful cannulation of the vein and catheter placement. There is return of non-pulsatile dark red blood. The patient tolerated the procedure well without complications. Images archived electronically.  CPT codes: 16109 and 60454    Albertus Hughs, DO 06/20/23 1028

## 2023-06-20 NOTE — ED Triage Notes (Signed)
 Pt reports that she has having a terrible headache since yesterday. No nausea, vomiting or dizziness. States that pain comes and goes. No light sensitivity. Pain is focused to the left side

## 2023-06-20 NOTE — ED Provider Notes (Signed)
 [facility] Provider Note   CSN: [id number] Arrival date & time: 06/20/23  0850     History  Chief Complaint  Patient presents with   Headache    Jennifer Rich is a 58 y.o. female.  Patient with history of obesity, hypertension, diabetes presents today with complaints of headache. She states that same began yesterday evening when she was in a serious argument with her husband. She states that her headache came on gradually soon after the argument was over. Pain is left sided in nature. No neck pain or stiffness. States she has never had a headache like this before and she was therefore concerned. She denies any photophobia, phonophobia, vision changes, dizziness, weakness, numbness/tingling. No trauma. She took her blood pressure last night as she is prescribed.   The history is provided by the patient. No language interpreter was used.  Headache      Home Medications Prior to Admission medications   Medication Sig Start Date End Date Taking? Authorizing Provider  albuterol  (VENTOLIN  HFA) 108 (90 Base) MCG/ACT inhaler Inhale 1-2 puffs into the lungs every 6 (six) hours as needed for wheezing or shortness of breath. 01/22/21   Deatra Face, MD  albuterol  (VENTOLIN  HFA) 108 (90 Base) MCG/ACT inhaler Inhale 2 puffs into the lungs every 6 (six) hours as needed. 06/05/23   Saguier, Gaylin Ke, PA-C  ALPRAZolam  (XANAX ) 0.25 MG tablet Take 1 tablet (0.25 mg total) by mouth 2 (two) times daily as needed for anxiety. 05/01/23   Everlina Hock, NP  atorvastatin  (LIPITOR) 10 MG tablet Take 1 tablet (10 mg total) by mouth daily. 05/01/23   Everlina Hock, NP  azelastine  (ASTELIN ) 0.1 % nasal spray Place 2 sprays into both nostrils 2 (two) times daily. Use in each nostril as directed 06/05/23   Saguier, Gaylin Ke, PA-C  benzonatate  (TESSALON ) 100 MG capsule Take 1 capsule (100 mg total) by mouth 3 (three) times daily as needed for cough. 06/05/23    Saguier, Gaylin Ke, PA-C  Continuous Glucose Receiver (FREESTYLE LIBRE 3 READER) DEVI Check blood sugar 12/31/22   Neda Balk, MD  Continuous Glucose Sensor (FREESTYLE LIBRE 3 SENSOR) MISC Place 1 sensor on the skin every 14 days. Use to check glucose continuously 12/16/22   Neda Balk, MD  cyclobenzaprine  (FLEXERIL ) 10 MG tablet Take 1 tablet (10 mg total) by mouth 2 (two) times daily as needed for muscle spasms. 06/11/23   Ninetta Basket, MD  glucose blood (FREESTYLE TEST STRIPS) test strip Use as instructed 12/16/22   Neda Balk, MD  Lancets (FREESTYLE) lancets Use as instructed 12/16/22   Neda Balk, MD  losartan -hydrochlorothiazide  (HYZAAR ) 100-25 MG tablet Take 1 tablet by mouth daily. 05/01/23   Everlina Hock, NP  olopatadine  (PATANOL) 0.1 % ophthalmic solution Place 1 drop into both eyes 2 (two) times daily. 06/09/23   Saguier, Gaylin Ke, PA-C  omeprazole  (PRILOSEC) 20 MG capsule Take 1 capsule (20 mg total) by mouth daily. 12/16/22   Rancour, Mara Seminole, MD  Semaglutide ,0.25 or 0.5MG /DOS, (OZEMPIC , 0.25 OR 0.5 MG/DOSE,) 2 MG/3ML SOPN Inject 0.5 mg into the skin once a week. 01/26/23   Neda Balk, MD  spironolactone  (ALDACTONE ) 25 MG tablet Take 1 tablet (25 mg total) by mouth 2 (two) times daily. 05/01/23   Everlina Hock, NP  valACYclovir  (VALTREX ) 500 MG tablet Take 500 mg by mouth as needed. 05/20/21   [provider]  Vitamin D , Ergocalciferol , (DRISDOL ) 1.25  MG (50000 UNIT) CAPS capsule Take 1 capsule (50,000 Units total) by mouth every 7 (seven) days. 05/01/23   Everlina Hock, NP      Allergies    Metoprolol  and Lisinopril    Review of Systems   Review of Systems  Neurological:  Positive for headaches.  All other systems reviewed and are negative.   Physical Exam Updated Vital Signs BP (!) 169/88   Pulse 79   Temp 98.6 F (37 C) (Oral)   Ht 5\' 7"  (1.702 m)   Wt (!) 142.9 kg   LMP 09/14/2017   SpO2 99%   BMI 49.34 kg/m  Physical Exam Vitals  and nursing note reviewed.  Constitutional:      General: She is not in acute distress.    Appearance: Normal appearance. She is normal weight. She is not ill-appearing, toxic-appearing or diaphoretic.  HENT:     Head: Normocephalic and atraumatic.  Neck:     Comments: No meningismus Cardiovascular:     Rate and Rhythm: Normal rate.  Pulmonary:     Effort: Pulmonary effort is normal. No respiratory distress.  Musculoskeletal:        General: Normal range of motion.     Cervical back: Normal range of motion.  Skin:    General: Skin is warm and dry.  Neurological:     General: No focal deficit present.     Mental Status: She is alert and oriented to person, place, and time.     GCS: GCS eye subscore is 4. GCS verbal subscore is 5. GCS motor subscore is 6.     Sensory: Sensation is intact.     Motor: Motor function is intact.     Coordination: Coordination is intact.     Gait: Gait is intact.     Comments: Alert and oriented to self, place, time and event.    Speech is fluent, clear without dysarthria or dysphasia.    Strength 5/5 in upper/lower extremities   Sensation intact in upper/lower extremities    CN I not tested  CN II grossly intact visual fields bilaterally. Did not visualize posterior eye.  CN III, IV, VI PERRLA and EOMs intact bilaterally  CN V Intact sensation to sharp and light touch to the face  CN VII facial movements symmetric  CN VIII not tested  CN IX, X no uvula deviation, symmetric rise of soft palate  CN XI 5/5 SCM and trapezius strength bilaterally  CN XII Midline tongue protrusion, symmetric L/R movements   Psychiatric:        Mood and Affect: Mood normal.        Behavior: Behavior normal.     ED Results / Procedures / Treatments   Labs (all labs ordered are listed, but only abnormal results are displayed) Labs Reviewed  CBC - Abnormal; Notable for the following components:      Result Value   RBC 5.25 (*)    MCV 77.9 (*)    MCH 24.8 (*)     All other components within normal limits  BASIC METABOLIC PANEL WITH GFR - Abnormal; Notable for the following components:   Glucose, Bld 148 (*)    BUN 22 (*)    Creatinine, Ser 1.23 (*)    GFR, Estimated 51 (*)    All other components within normal limits    EKG None  Radiology CT Head Wo Contrast Result Date: 06/20/2023 CLINICAL DATA:  58 year old female with persistent headache, severe headache since yesterday. Left  side pain. EXAM: CT HEAD WITHOUT CONTRAST TECHNIQUE: Contiguous axial images were obtained from the base of the skull through the vertex without intravenous contrast. RADIATION DOSE REDUCTION: This exam was performed according to the departmental dose-optimization program which includes automated exposure control, adjustment of the mA and/or kV according to patient size and/or use of iterative reconstruction technique. COMPARISON:  Head CT 01/03/2015. FINDINGS: Brain: Normal cerebral volume. No midline shift, ventriculomegaly, mass effect, evidence of mass lesion, intracranial hemorrhage or evidence of cortically based acute infarction. Gray-white matter differentiation is within normal limits throughout the brain. Vascular: Mild Calcified atherosclerosis at the skull base. No suspicious intracranial vascular hyperdensity. Skull: Intact.  No acute osseous abnormality identified. Sinuses/Orbits: Mild right sphenoid sinus mucosal thickening. Other visualized paranasal sinuses and mastoids are stable and well aerated. No sinus fluid. Tympanic cavities are clear. Other: Visualized orbits and scalp soft tissues are within normal limits. IMPRESSION: 1. Normal for age noncontrast CT appearance of the brain. 2. Mild paranasal sinus mucosal thickening limited to the right sphenoid, significance doubtful. Electronically Signed   By: Marlise Simpers M.D.   On: 06/20/2023 11:28    Procedures Procedures    Medications Ordered in ED Medications  prochlorperazine  (COMPAZINE ) injection 10 mg (has  no administration in time range)  diphenhydrAMINE  (BENADRYL ) injection 12.5 mg (has no administration in time range)  dexamethasone  (DECADRON ) injection 10 mg (has no administration in time range)  ketorolac  (TORADOL ) 15 MG/ML injection 15 mg (has no administration in time range)    ED Course/ Medical Decision Making/ A&P                                 Medical Decision Making Amount and/or Complexity of Data Reviewed Labs: ordered. Radiology: ordered.  Risk Prescription drug management.   This patient is a 58 y.o. female who presents to the ED for concern of headache, this involves an extensive number of treatment options, and is a complaint that carries with it a high risk of complications and morbidity. The emergent differential diagnosis prior to evaluation includes, but is not limited to,  Stroke, increased ICP, meningitis, CVA, intracranial tumor, venous sinus thrombosis, migraine, cluster headache, hypertension, drug related, head injury, tension headache, sinusitis, dental abscess, otitis media, TMJ, temporal arteritis, glaucoma, trigeminal neuralgia.   This is not an exhaustive differential.   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  Additional history: Chart reviewed.  Physical Exam: Physical exam performed. The pertinent findings include: Patient alert and oriented and neurologically intact without  focal deficits  Lab Tests: I ordered, and personally interpreted labs.  The pertinent results include:  no acute laboratory abnormalities   Imaging Studies: I ordered imaging studies including CT head. I independently visualized and interpreted imaging which showed   1. Normal for age noncontrast CT appearance of the brain. 2. Mild paranasal sinus mucosal thickening limited to the right sphenoid, significance doubtful.  I agree with the radiologist interpretation.   Medications: I ordered medication including decadron , benadryl , compazine , toradol   for pain. Reevaluation  of the patient after these medicines showed that the patient resolved. I have reviewed the patients home medicines and have made adjustments as needed.   Disposition: After consideration of the diagnostic results and the patients response to treatment, I feel that emergency department workup does not suggest an emergent condition requiring admission or immediate intervention beyond what has been performed at this time. The plan is: Discharge with close outpatient follow-up and return precautions.  After interventions, patient is feeling substantially improved and ready to go home.  Doubt the sinus disease seen on CT imaging is relevant given patient's clinical picture.  Discussed same with patient is understanding and agreement with this.  Evaluation and diagnostic testing in the emergency department does not suggest an emergent condition requiring admission or immediate intervention beyond what has been performed at this time.  Plan for discharge with close PCP follow-up.  Patient is understanding and amenable with plan, educated on red flag symptoms that would prompt immediate return.  Patient discharged in stable condition.  Final Clinical Impression(s) / ED Diagnoses Final diagnoses:  Bad headache    Rx / DC Orders ED Discharge Orders     None     An After Visit Summary was printed and given to the patient.      Jennifer Rich A, PA-C 06/20/23 1151    Albertus Hughs, DO 06/20/23 1333

## 2023-06-20 NOTE — Discharge Instructions (Signed)
 As we discussed, your workup in the ER today was reassuring for acute findings.  Laboratory evaluation and CT imaging did not reveal any emergent concerns.  Given that you are feeling better after interventions today, no further evaluation is indicated.  I recommend that you get plenty of rest and stay good and hydrated particularly with fluids with electrolytes.  Please call your primary provider to schedule follow-up appointment as needed.  Return if development of any new or worsening symptoms.

## 2023-06-24 ENCOUNTER — Encounter: Payer: Self-pay | Admitting: Family Medicine

## 2023-06-24 DIAGNOSIS — E669 Obesity, unspecified: Secondary | ICD-10-CM

## 2023-06-25 ENCOUNTER — Other Ambulatory Visit (HOSPITAL_COMMUNITY): Payer: Self-pay

## 2023-06-25 ENCOUNTER — Telehealth: Payer: Self-pay

## 2023-06-25 MED ORDER — TIRZEPATIDE 2.5 MG/0.5ML ~~LOC~~ SOAJ: 2.5000 mg | SUBCUTANEOUS | 1 refills | Status: DC

## 2023-06-25 MED ORDER — TIRZEPATIDE 2.5 MG/0.5ML ~~LOC~~ SOAJ
2.5000 mg | SUBCUTANEOUS | 1 refills | Status: DC
Start: 2023-06-25 — End: 2023-06-25

## 2023-06-25 NOTE — Addendum Note (Signed)
 Addended by: Shiv Shuey L on: 06/25/2023 02:28 PM   Modules accepted: Orders

## 2023-06-25 NOTE — Telephone Encounter (Signed)
 Pharmacy Patient Advocate Encounter  Received notification from [employer] that Prior Authorization for Mounjaro  2.5MG /0.5ML auto-injectors has been APPROVED from 06/25/23 to 06/24/24. Ran test claim, Copay is $159.18. This test claim was processed through [facility]- copay amounts may vary at other pharmacies due to pharmacy/plan contracts, or as the patient moves through the different stages of their insurance plan.   PA #/Case ID/Reference #: [id number]

## 2023-07-02 ENCOUNTER — Other Ambulatory Visit: Payer: Self-pay | Admitting: Medical

## 2023-08-10 ENCOUNTER — Emergency Department (HOSPITAL_BASED_OUTPATIENT_CLINIC_OR_DEPARTMENT_OTHER)

## 2023-08-10 ENCOUNTER — Emergency Department (HOSPITAL_BASED_OUTPATIENT_CLINIC_OR_DEPARTMENT_OTHER)
Admission: EM | Admit: 2023-08-10 | Discharge: 2023-08-10 | Disposition: A | Discharge status: Home / Self Care | Attending: Emergency Medicine | Admitting: Emergency Medicine

## 2023-08-10 ENCOUNTER — Encounter (HOSPITAL_BASED_OUTPATIENT_CLINIC_OR_DEPARTMENT_OTHER): Payer: Self-pay

## 2023-08-10 ENCOUNTER — Other Ambulatory Visit: Payer: Self-pay

## 2023-08-10 DIAGNOSIS — S93492A Sprain of other ligament of left ankle, initial encounter: Secondary | ICD-10-CM | POA: Diagnosis not present

## 2023-08-10 DIAGNOSIS — Y9301 Activity, walking, marching and hiking: Secondary | ICD-10-CM | POA: Insufficient documentation

## 2023-08-10 DIAGNOSIS — Z79899 Other long term (current) drug therapy: Secondary | ICD-10-CM | POA: Insufficient documentation

## 2023-08-10 DIAGNOSIS — X58XXXA Exposure to other specified factors, initial encounter: Secondary | ICD-10-CM | POA: Insufficient documentation

## 2023-08-10 DIAGNOSIS — S93402A Sprain of unspecified ligament of left ankle, initial encounter: Secondary | ICD-10-CM

## 2023-08-10 DIAGNOSIS — S99912A Unspecified injury of left ankle, initial encounter: Secondary | ICD-10-CM | POA: Diagnosis present

## 2023-08-10 NOTE — Discharge Instructions (Signed)
 You were seen in the emerged part for a left ankle injury The x-ray did not show any evidence of fracture or dislocation This is most likely an ankle sprain We gave you a soft Aircast and crutches You should elevate the ankle apply ice and take ibuprofen  as directed This should be getting better over the next 1 to 2 weeks Return to the emergency department for severe pain or if you are unable to walk

## 2023-08-10 NOTE — ED Triage Notes (Signed)
 Pt arrives with c/o left ankle pain. Per pt, she was getting out of a truck and twisted her ankle. Pt ambulatory to triage. Pt endorses swelling.

## 2023-08-10 NOTE — ED Provider Notes (Signed)
 [facility] Provider Note   CSN: [id number] Arrival date & time: 08/10/23  2108     History  Chief Complaint  Patient presents with   Ankle Pain    Jennifer Rich is a 58 y.o. female.  Who presents for a left ankle injury.  Patient inverted and rolled her ankle while getting out of her husband's pickup truck last night.  Has had pain in the left ankle since then made worse with walking.  Pain localized over the lateral aspect of the ankle.  No other injuries   Ankle Pain      Home Medications Prior to Admission medications   Medication Sig Start Date End Date Taking? Authorizing Provider  albuterol  (VENTOLIN  HFA) 108 (90 Base) MCG/ACT inhaler Inhale 1-2 puffs into the lungs every 6 (six) hours as needed for wheezing or shortness of breath. 01/22/21   Nanavati, Ankit, MD  albuterol  (VENTOLIN  HFA) 108 (90 Base) MCG/ACT inhaler TAKE 2 PUFFS BY MOUTH EVERY 6 HOURS AS NEEDED 07/02/23   Saguier, Gaylin Ke, PA-C  ALPRAZolam  (XANAX ) 0.25 MG tablet Take 1 tablet (0.25 mg total) by mouth 2 (two) times daily as needed for anxiety. 05/01/23   Everlina Hock, NP  atorvastatin  (LIPITOR) 10 MG tablet Take 1 tablet (10 mg total) by mouth daily. 05/01/23   Everlina Hock, NP  azelastine  (ASTELIN ) 0.1 % nasal spray Place 2 sprays into both nostrils 2 (two) times daily. Use in each nostril as directed 06/05/23   Saguier, Gaylin Ke, PA-C  benzonatate  (TESSALON ) 100 MG capsule Take 1 capsule (100 mg total) by mouth 3 (three) times daily as needed for cough. 06/05/23   Saguier, Gaylin Ke, PA-C  Continuous Glucose Receiver (FREESTYLE LIBRE 3 READER) DEVI Check blood sugar 12/31/22   Neda Balk, MD  Continuous Glucose Sensor (FREESTYLE LIBRE 3 SENSOR) MISC Place 1 sensor on the skin every 14 days. Use to check glucose continuously 12/16/22   Neda Balk, MD  cyclobenzaprine  (FLEXERIL ) 10 MG tablet Take 1 tablet (10 mg total) by mouth 2 (two) times daily as needed  for muscle spasms. 06/11/23   Ninetta Basket, MD  glucose blood (FREESTYLE TEST STRIPS) test strip Use as instructed 12/16/22   Neda Balk, MD  Lancets (FREESTYLE) lancets Use as instructed 12/16/22   Neda Balk, MD  losartan -hydrochlorothiazide  (HYZAAR ) 100-25 MG tablet Take 1 tablet by mouth daily. 05/01/23   Everlina Hock, NP  olopatadine  (PATANOL) 0.1 % ophthalmic solution Place 1 drop into both eyes 2 (two) times daily. 06/09/23   Saguier, Gaylin Ke, PA-C  omeprazole  (PRILOSEC) 20 MG capsule Take 1 capsule (20 mg total) by mouth daily. 12/16/22   Rancour, Mara Seminole, MD  spironolactone  (ALDACTONE ) 25 MG tablet Take 1 tablet (25 mg total) by mouth 2 (two) times daily. 05/01/23   Everlina Hock, NP  tirzepatide  (MOUNJARO ) 2.5 MG/0.5ML Pen Inject 2.5 mg into the skin once a week. 06/25/23   Everlina Hock, NP  valACYclovir  (VALTREX ) 500 MG tablet Take 500 mg by mouth as needed. 05/20/21   [provider]  Vitamin D , Ergocalciferol , (DRISDOL ) 1.25 MG (50000 UNIT) CAPS capsule Take 1 capsule (50,000 Units total) by mouth every 7 (seven) days. 05/01/23   Everlina Hock, NP      Allergies    Metoprolol  and Lisinopril    Review of Systems   Review of Systems  Physical Exam Updated Vital Signs BP (!) 178/87 (BP Location: Right Arm)  Pulse 88   Temp 98.1 F (36.7 C) (Oral)   Resp 18   Wt (!) 142.9 kg   LMP 09/14/2017   SpO2 96%   BMI 49.34 kg/m  Physical Exam Vitals and nursing note reviewed.  HENT:     Head: Normocephalic and atraumatic.  Eyes:     Pupils: Pupils are equal, round, and reactive to light.  Cardiovascular:     Rate and Rhythm: Normal rate and regular rhythm.  Pulmonary:     Effort: Pulmonary effort is normal.     Breath sounds: Normal breath sounds.  Abdominal:     Palpations: Abdomen is soft.     Tenderness: There is no abdominal tenderness.  Musculoskeletal:     Comments: Soft tissue swelling over lateral and dorsal aspect of left ankle with  tenderness over the lateral malleolus No tenderness of tib-fib or knee No instability Sensation tact light touch throughout 5 out of 5 motor strength in left foot  Skin:    General: Skin is warm and dry.  Neurological:     Mental Status: She is alert.  Psychiatric:        Mood and Affect: Mood normal.     ED Results / Procedures / Treatments   Labs (all labs ordered are listed, but only abnormal results are displayed) Labs Reviewed - No data to display  EKG None  Radiology DG Ankle Complete Left Result Date: 08/10/2023 CLINICAL DATA:  Ankle pain EXAM: LEFT ANKLE COMPLETE - 3+ VIEW COMPARISON:  05/25/2015 FINDINGS: No definitive displaced fracture is seen. Small plantar calcaneal spur. No dislocation IMPRESSION: No acute osseous abnormality. Electronically Signed   By: Esmeralda Hedge M.D.   On: 08/10/2023 21:55    Procedures Procedures    Medications Ordered in ED Medications - No data to display  ED Course/ Medical Decision Making/ A&P                                 Medical Decision Making 58 year old female here for left ankle injury.  Inversion injury sustained last night while getting out of her husband's truck.  Tenderness over the lateral malleolus.  No other injuries.  X-ray shows no acute osseous abnormality.  Suspect this is an ankle sprain.  Counseled her on supportive care of this injury.  Will provide her with soft air ankle brace and crutches  Amount and/or Complexity of Data Reviewed Radiology: ordered.           Final Clinical Impression(s) / ED Diagnoses Final diagnoses:  Sprain of left ankle, unspecified ligament, initial encounter    Rx / DC Orders ED Discharge Orders     None         Sallyanne Creamer, DO 08/10/23 2307

## 2023-08-17 ENCOUNTER — Other Ambulatory Visit: Payer: Self-pay | Admitting: Family Medicine

## 2023-08-17 DIAGNOSIS — Z1231 Encounter for screening mammogram for malignant neoplasm of breast: Secondary | ICD-10-CM

## 2023-08-24 ENCOUNTER — Ambulatory Visit

## 2023-08-24 ENCOUNTER — Ambulatory Visit
Admission: RE | Admit: 2023-08-24 | Discharge: 2023-08-24 | Disposition: A | Discharge status: Home / Self Care | Source: Ambulatory Visit | Attending: Family Medicine | Admitting: Family Medicine

## 2023-08-24 DIAGNOSIS — Z1231 Encounter for screening mammogram for malignant neoplasm of breast: Secondary | ICD-10-CM

## 2023-09-09 ENCOUNTER — Ambulatory Visit (INDEPENDENT_AMBULATORY_CARE_PROVIDER_SITE_OTHER): Admitting: Physician Assistant

## 2023-09-09 ENCOUNTER — Encounter: Payer: Self-pay | Admitting: Physician Assistant

## 2023-09-09 DIAGNOSIS — S93402A Sprain of unspecified ligament of left ankle, initial encounter: Secondary | ICD-10-CM | POA: Diagnosis not present

## 2023-09-09 NOTE — Progress Notes (Signed)
 Office Visit Note   Patient: Jennifer Rich           Date of Birth: Nov 18, 1965           MRN: [id number] Visit Date: 09/09/2023              Requested by: Domenica Harlene DELENA, MD [id number] [place],  [place] [address] PCP: Domenica Harlene DELENA, MD  Chief Complaint  Patient presents with   Left Ankle - Pain      HPI: Jennifer Rich is a 58 y.o. female.  Who presents for follow up after a left ankle injury. She rolled her ankle in varus while getting out of her husbands truck.  She was seen on 08/10/23 in the ED.  Placed in an air cast and weight bearing as tolerated.  She wore the splint for one week.  She denies much discomfort until the end of the day.  She does develop edema on the lateral malleolus.  The edema does go away over night.  Her concern is that she does not trust the ankle to support her.  She is afraid she will re sprain it.    Assessment & Plan: Visit Diagnoses:  1. Sprain of left ankle, unspecified ligament, initial encounter     Plan: I will refer her to PT for strengthening and ROM exercise.  If she does not continue to improve she will call us  as needed.    Follow-Up Instructions: Return if symptoms worsen or fail to improve.   Ortho Exam  X ray FINDINGS: No definitive displaced fracture is seen. Small plantar calcaneal spur. No dislocation   IMPRESSION: No acute osseous abnormality.  Patient is alert, oriented, no adenopathy, well-dressed, normal affect, normal respiratory effort. Lateral malleolus edema, NTTP Pain with inversion of the ankle mild Full dorsiflexion and plantar flexion.   Palpable DP pulse left LE Sensation intact and equal B LE    Imaging: No results found. No images are attached to the encounter.  Labs: Lab Results  Component Value Date   HGBA1C 6.8 (H) 05/01/2023   HGBA1C 6.8 (H) 12/09/2022   HGBA1C 8.4 (H) 04/08/2022   REPTSTATUS 03/01/2012 FINAL 02/26/2012   CULT  02/26/2012    NO SALMONELLA,  SHIGELLA, CAMPYLOBACTER, YERSINIA, OR E.COLI 0157:H7 ISOLATED Note: REDUCED NORMAL FLORA PRESENT   LABORGA No Salmonella,Shigella,Campylobacter,Yersinia,or 06/14/2013   LABORGA No E.coli 0157:H7 isolated. 06/14/2013     Lab Results  Component Value Date   ALBUMIN 3.8 05/01/2023   ALBUMIN 3.9 12/09/2022   ALBUMIN 3.9 07/24/2022    Lab Results  Component Value Date   MG 1.9 04/09/2021   Lab Results  Component Value Date   VD25OH 25.19 (L) 05/01/2023   VD25OH 21.50 (L) 12/09/2022   VD25OH 31.30 07/24/2022    No results found for: PREALBUMIN    Latest Ref Rng & Units 06/20/2023   10:15 AM 05/01/2023    8:25 AM 12/15/2022   11:50 PM  CBC EXTENDED  WBC 4.0 - 10.5 K/uL 5.7  6.1  7.3   RBC 3.87 - 5.11 MIL/uL 5.25  5.37  5.40   Hemoglobin 12.0 - 15.0 g/dL 86.9  86.6  86.8   HCT 36.0 - 46.0 % 40.9  41.5  42.2   Platelets 150 - 400 K/uL 202  212.0  223   NEUT# 1.4 - 7.7 K/uL  3.1    Lymph# 0.7 - 4.0 K/uL  2.4  There is no height or weight on file to calculate BMI.  Orders:  Orders Placed This Encounter  Procedures   Ambulatory referral to Physical Therapy   No orders of the defined types were placed in this encounter.    Procedures: No procedures performed  Clinical Data: No additional findings.  ROS:  All other systems negative, except as noted in the HPI. Review of Systems  Objective: Vital Signs: LMP 09/14/2017   Specialty Comments:  No specialty comments available.  PMFS History: Patient Active Problem List   Diagnosis Date Noted   Lesion of adrenal gland (HCC) 07/28/2022   Primary osteoarthritis of right shoulder 07/07/2022   Hepatitis C antibody test positive 11/20/2021   Obstructive sleep apnea syndrome 11/20/2021   Other fatigue 11/18/2021   SOB (shortness of breath) on exertion 11/18/2021   Health care maintenance 11/18/2021   Type 2 diabetes mellitus with obesity (HCC) 11/18/2021   Vitamin B 12 deficiency 11/18/2021   Class 3 severe  obesity with serious comorbidity and body mass index (BMI) of 50.0 to 59.9 in adult 11/18/2021   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 (HCC) 07/23/2021   Syphilis 06/19/2021   Parotid mass 05/24/2021   Parotitis 05/15/2021   Tinnitus of right ear 05/15/2021   Fatty liver 04/15/2021   Hx of hepatitis C 04/15/2021   Essential hypertension, benign 11/02/2013   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   Anxiety and depression 01/30/2013   Cervical cancer screening 01/25/2013   Preventative health care 12/18/2012   Hyperlipidemia    Hyperglycemia    Vitamin D  deficiency    Goiter    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   BLASTOCYSTIS HOMINIS     CAP (community acquired pneumonia) 04/17/2015   Cervical cancer screening 01/25/2013   Chicken pox as a child   Fatty liver    Goiter    Mildly enlarged thyroid  TSH normal    Hepatitis C    was treated in 2022   Hyperglycemia    Hyperlipidemia    Hypertension    Obesity    Obesity, unspecified 04/26/2013   Pneumonia 05/02/2013   Preventative health care 12/18/2012   Has seen Dr Levora for GYN in past   Sleep apnea    Type II or unspecified type diabetes mellitus without mention of complication, not stated as uncontrolled    Vitamin D  deficiency     Family History  Problem Relation Age of Onset   High blood pressure Mother    High Cholesterol Mother    High blood pressure Father    Alcoholism Father    Hypertension Sister    Hypertension Sister    Diabetes Maternal Grandmother        type 2   Blindness Maternal Grandmother    Heart attack Maternal Grandfather 75   Heart disease Maternal Grandfather    Hypertension Paternal Grandmother    Stroke Paternal Grandfather    Colon cancer Neg Hx    Esophageal cancer Neg Hx    Stomach cancer Neg Hx    BRCA 1/2 Neg Hx    Breast cancer Neg Hx     Past Surgical History:  Procedure  Laterality Date   WISDOM TOOTH EXTRACTION  58 yrs old   Social History   Occupational History   Occupation: Surveyor, minerals: [employer]  Tobacco Use   Smoking status:  Never   Smokeless tobacco: Never  Vaping Use   Vaping status: Never Used  Substance and Sexual Activity   Alcohol use: No   Drug use: No   Sexual activity: Yes    Partners: Male    Comment: lives with husband and [place], [name], no dietary restrictions.

## 2023-09-11 ENCOUNTER — Other Ambulatory Visit: Payer: Self-pay

## 2023-09-11 ENCOUNTER — Encounter: Payer: Self-pay | Admitting: Rehabilitative and Restorative Service Providers"

## 2023-09-11 ENCOUNTER — Ambulatory Visit: Attending: Physician Assistant | Admitting: Rehabilitative and Restorative Service Providers"

## 2023-09-11 DIAGNOSIS — S93402A Sprain of unspecified ligament of left ankle, initial encounter: Secondary | ICD-10-CM | POA: Diagnosis not present

## 2023-09-11 DIAGNOSIS — M6281 Muscle weakness (generalized): Secondary | ICD-10-CM | POA: Diagnosis present

## 2023-09-11 DIAGNOSIS — M25572 Pain in left ankle and joints of left foot: Secondary | ICD-10-CM | POA: Insufficient documentation

## 2023-09-11 NOTE — Therapy (Unsigned)
 OUTPATIENT PHYSICAL THERAPY LOWER EXTREMITY EVALUATION   Patient Name: GENESIA CASLIN MRN: [id number] DOB:Nov 22, 1965, 58 y.o., female Today's Date: 09/12/2023  END OF SESSION:  PT End of Session - 09/11/23 1119     Visit Number 1    Number of Visits 8    Date for PT Re-Evaluation 11/10/23    Authorization Type BCBS no visit limit    PT Start Time 1110    PT Stop Time 1150    PT Time Calculation (min) 40 min    Activity Tolerance Patient tolerated treatment well    Behavior During Therapy [facility] for tasks assessed/performed          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   BLASTOCYSTIS HOMINIS     CAP (community acquired pneumonia) 04/17/2015   Cervical cancer screening 01/25/2013   Chicken pox as a child   Fatty liver    Goiter    Mildly enlarged thyroid  TSH normal    Hepatitis C    was treated in 2022   Hyperglycemia    Hyperlipidemia    Hypertension    Obesity    Obesity, unspecified 04/26/2013   Pneumonia 05/02/2013   Preventative health care 12/18/2012   Has seen Dr Levora for GYN in past   Sleep apnea    Type II or unspecified type diabetes mellitus without mention of complication, not stated as uncontrolled    Vitamin D  deficiency    Past Surgical History:  Procedure Laterality Date   WISDOM TOOTH EXTRACTION  58 yrs old   Patient Active Problem List   Diagnosis Date Noted   Lesion of adrenal gland (HCC) 07/28/2022   Primary osteoarthritis of right shoulder 07/07/2022   Hepatitis C antibody test positive 11/20/2021   Obstructive sleep apnea syndrome 11/20/2021   Other fatigue 11/18/2021   SOB (shortness of breath) on exertion 11/18/2021   Health care maintenance 11/18/2021   Type 2 diabetes mellitus with obesity (HCC) 11/18/2021   Vitamin B 12 deficiency 11/18/2021   Class 3 severe obesity with serious comorbidity and body mass index (BMI) of 50.0 to 59.9  in adult 11/18/2021   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 (HCC) 07/23/2021   Syphilis 06/19/2021   Parotid mass 05/24/2021   Parotitis 05/15/2021   Tinnitus of right ear 05/15/2021   Fatty liver 04/15/2021   Hx of hepatitis C 04/15/2021   Essential hypertension, benign 11/02/2013   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   Anxiety and depression 01/30/2013   Cervical cancer screening 01/25/2013   Preventative health care 12/18/2012   Hyperlipidemia    Hyperglycemia    Vitamin D  deficiency    Goiter     PCP: Domenica Blackbird MD REFERRING PROVIDER: Gerome Maurilio HERO, PA-C  REFERRING DIAG: [phone] (ICD-10-CM) - Sprain of left ankle, unspecified ligament, initial encounter   THERAPY DIAG:  Pain in left ankle and joints of left foot  Muscle weakness (generalized)  Rationale for Evaluation and Treatment: Rehabilitation  ONSET DATE: 09/09/23  SUBJECTIVE:  SUBJECTIVE STATEMENT: The patient reports an ankle injury 08/10/23 when stepping out of her husband's truck  She was seen at ED and used an air cast for a few days. She is improving with pain since the initial injury, but continues with edema late in the day and pain late in the day. She also reports a sensation of instability. Going up and down steps is  challenging for her.    PERTINENT HISTORY: HTN, sleep apnea, obesity, hypercholesterolemia PAIN:  Are you having pain? Yes: NPRS scale: 0/10 now, goes up to 3-4/10 Pain location: L  lateral ankle Pain description: soreness, weakness Aggravating factors: later In the day, stairs, prolonged walking Relieving factors: rest, early in the day  PRECAUTIONS: None  WEIGHT BEARING RESTRICTIONS: No  FALLS:  Has patient fallen in last 6 months? Yes. Number of falls 1 fell getting out of her husband's truck  LIVING ENVIRONMENT: Lives with: lives with their spouse -- 38 year old grandson stays with her Lives in: House/apartment Stairs: Yes: External: 12 steps; one rail Has following  equipment at home: None  OCCUPATION: works full time-- Scientist, water quality-- sitting at a desk  PLOF: Independent  PATIENT GOALS: stability-- not feeling like it is going to give out  OBJECTIVE:  Note: Objective measures were completed at Evaluation unless otherwise noted.  DIAGNOSTIC FINDINGS: xray WNLs  PATIENT SURVEYS:  LEFS =57.5%   COGNITION: Overall cognitive status: Within functional limits for tasks assessed     SENSATION: WFL  EDEMA:  Mild edema noted around lateral mealleoli  MUSCLE LENGTH: Gastrocs tight bilat  PALPATION: No tenderness; note hypomobility subtallar region  LOWER EXTREMITY ROM: Active ROM Right eval Left eval  Hip flexion    Hip extension    Hip abduction    Hip adduction    Hip internal rotation    Hip external rotation    Knee flexion    Knee extension    Ankle dorsiflexion  90 (neutral)  Ankle plantarflexion    Ankle inversion  12 deg with pain  Ankle eversion     (Blank rows = not tested)  LOWER EXTREMITY MMT: MMT Right eval Left eval  Hip flexion 5/5 4/5  Hip extension    Hip abduction    Hip adduction    Hip internal rotation    Hip external rotation    Knee flexion 5/5 5/5  Knee extension 5/5 5/5  Ankle dorsiflexion 5/5 5/5  Ankle plantarflexion 4/5 4/5  Ankle inversion    Ankle eversion     (Blank rows = not tested) *rolls into ankle inversion with heel raises bilat  LOWER EXTREMITY SPECIAL TESTS:  Ankle special tests: Anterior drawer test: negative  FUNCTIONAL TESTS:  Single limb standing dec'd bilaterally  GAIT: Distance walked: 100 ft Assistive device utilized: None Level of assistance: Complete Independence Comments: ankle inversion bilat                                                                                                                              [facility] Adult PT Treatment:                                                DATE: 09/11/23 Therapeutic Exercise:  See HEP  PATIENT EDUCATION:   Education details: HEP, nature of physical therapy, goals of care Person educated: Patient Education method: Explanation, Demonstration, and Handouts Education comprehension: verbalized understanding, returned demonstration, and needs further education  HOME EXERCISE PROGRAM: Access Code: [email or web address] URL: [email or web address] Date: 09/11/2023 Prepared by: Tawni Ferrier  Exercises - Gastroc Stretch on Wall  - 1 x daily - 5 x weekly - 1 sets - 1 reps - 30 seconds hold - Seated Calf Raise With Small Ball at Heels  - 1 x daily - 5 x weekly - 1 sets - 10 reps - Isometric Ankle Inversion  - 1 x daily - 5 x weekly - 1 sets - 10 reps - Ankle Eversion with Resistance  - 1 x daily - 5 x weekly - 1 sets - 10 reps - Towel Scrunches  - 1 x daily - 5 x weekly - 1 sets - 10 reps  ASSESSMENT:  CLINICAL IMPRESSION: Patient is a 58 y.o. female who was seen today for physical therapy evaluation and treatment for ankle sprain. She presents with reduced AROM, pain worse with ankle inversion (stretched position for lateral ankle), LE weakness, postural tightness in ankle (rolls into inversion), and a sensation of ankle instability. PT to address deficits to ensure return to full strength and improve function for IADLs.    OBJECTIVE IMPAIRMENTS: decreased activity tolerance, decreased ROM, decreased strength, hypomobility, increased edema, increased fascial restrictions, impaired flexibility, and pain.   ACTIVITY LIMITATIONS: stairs and locomotion level  PARTICIPATION LIMITATIONS: community activity  PERSONAL FACTORS: 3+ comorbidities: see above are also affecting patient's functional outcome.   REHAB POTENTIAL: Good  CLINICAL DECISION MAKING: Evolving/moderate complexity  EVALUATION COMPLEXITY: Moderate   GOALS: Goals reviewed with patient? Yes  SHORT TERM GOALS: Target date: 08/12/23  The patient will be indep with initial HEP Baseline: initiated at eval Goal status:  INITIAL  2. The patient will report no swelling at night.  Baseline:  edema late in the day Goal status: INITIAL  3.  The patient will demonstrate ability to negotiate steps reciprocal pattern. Baseline:  Reports ankle instability on steps and step to pattern Goal status: INITIAL   LONG TERM GOALS: Target date: 10/12/23  The patient will be indep with progression of HEP. Baseline:  initiated at eval Goal status: INITIAL  2.  The patient will improve LEFS by 12% to demonstrate improved functional abilities. Baseline: 57.5% Goal status: INITIAL  3.  The patient will improve ankle inversion to 18 degrees without pain L ankle to demo pain free ankle movement.  Baseline:  see above Goal status: INITIAL  4.  The patient will improve LE strength to 5/5 (hip flexion, ankle PF) in order to perform daily activities without sensation of instability.  Baseline:  see above Goal status: INITIAL  PLAN:  PT FREQUENCY: 1x/week  PT DURATION: 8 weeks  PLANNED INTERVENTIONS: 97164- PT Re-evaluation, 97110-Therapeutic exercises, 97530- Therapeutic activity, [id number]- Neuromuscular re-education, 97535- Self Care, [id number]- Manual therapy, [phone]- Gait training, [phone]- Electrical stimulation (unattended), 97016- Vasopneumatic device, 97033- Ionotophoresis 4mg /ml Dexamethasone , Patient/Family education, Stair training, Taping, Joint mobilization, and Cryotherapy  PLAN FOR NEXT SESSION: Progress ankle exercises-- single leg standing, foot/ankle mobility exercises, hip flexor strengthening, LE flexibility, and stair negotiation.    Massiel Stipp, PT 09/12/2023, 11:11 AM

## 2023-09-18 ENCOUNTER — Ambulatory Visit

## 2023-09-18 ENCOUNTER — Ambulatory Visit: Admitting: Podiatry

## 2023-09-18 DIAGNOSIS — M6281 Muscle weakness (generalized): Secondary | ICD-10-CM

## 2023-09-18 DIAGNOSIS — M25572 Pain in left ankle and joints of left foot: Secondary | ICD-10-CM

## 2023-09-18 NOTE — Therapy (Signed)
 OUTPATIENT PHYSICAL THERAPY LOWER EXTREMITY TREATMENT   Patient Name: Jennifer Rich MRN: [id number] DOB:1965/08/31, 58 y.o., female Today's Date: 09/18/2023  END OF SESSION:  PT End of Session - 09/18/23 1103     Visit Number 2    Number of Visits 8    Date for PT Re-Evaluation 11/10/23    Authorization Type BCBS no visit limit    PT Start Time 1103    PT Stop Time 1143    PT Time Calculation (min) 40 min    Activity Tolerance Patient tolerated treatment well    Behavior During Therapy [facility] for tasks assessed/performed           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   BLASTOCYSTIS HOMINIS     CAP (community acquired pneumonia) 04/17/2015   Cervical cancer screening 01/25/2013   Chicken pox as a child   Fatty liver    Goiter    Mildly enlarged thyroid  TSH normal    Hepatitis C    was treated in 2022   Hyperglycemia    Hyperlipidemia    Hypertension    Obesity    Obesity, unspecified 04/26/2013   Pneumonia 05/02/2013   Preventative health care 12/18/2012   Has seen Dr Levora for GYN in past   Sleep apnea    Type II or unspecified type diabetes mellitus without mention of complication, not stated as uncontrolled    Vitamin D  deficiency    Past Surgical History:  Procedure Laterality Date   WISDOM TOOTH EXTRACTION  58 yrs old   Patient Active Problem List   Diagnosis Date Noted   Lesion of adrenal gland (HCC) 07/28/2022   Primary osteoarthritis of right shoulder 07/07/2022   Hepatitis C antibody test positive 11/20/2021   Obstructive sleep apnea syndrome 11/20/2021   Other fatigue 11/18/2021   SOB (shortness of breath) on exertion 11/18/2021   Health care maintenance 11/18/2021   Type 2 diabetes mellitus with obesity (HCC) 11/18/2021   Vitamin B 12 deficiency 11/18/2021   Class 3 severe obesity with serious comorbidity and body mass index (BMI) of 50.0 to 59.9  in adult 11/18/2021   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 (HCC) 07/23/2021   Syphilis 06/19/2021   Parotid mass 05/24/2021   Parotitis 05/15/2021   Tinnitus of right ear 05/15/2021   Fatty liver 04/15/2021   Hx of hepatitis C 04/15/2021   Essential hypertension, benign 11/02/2013   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   Anxiety and depression 01/30/2013   Cervical cancer screening 01/25/2013   Preventative health care 12/18/2012   Hyperlipidemia    Hyperglycemia    Vitamin D  deficiency    Goiter     PCP: Domenica Blackbird MD REFERRING PROVIDER: Gerome Maurilio HERO, PA-C  REFERRING DIAG: [phone] (ICD-10-CM) - Sprain of left ankle, unspecified ligament, initial encounter   THERAPY DIAG:  Pain in left ankle and joints of left foot  Muscle weakness (generalized)  Rationale for Evaluation and Treatment: Rehabilitation  ONSET DATE: 09/09/23  SUBJECTIVE:  SUBJECTIVE STATEMENT: Patient reports no pain right now. She still gets pain at night and feels like she is going to fall with stepping down activity. Has not completed HEP yet.   PERTINENT HISTORY: HTN, sleep apnea, obesity, hypercholesterolemia PAIN:  Are you having pain? Yes: NPRS scale: 0/10 now, goes up to 4/10 Pain location: L  lateral ankle Pain description: soreness, weakness Aggravating  factors: later In the day, stairs, prolonged walking Relieving factors: rest, early in the day  PRECAUTIONS: None  WEIGHT BEARING RESTRICTIONS: No  FALLS:  Has patient fallen in last 6 months? Yes. Number of falls 1 fell getting out of her husband's truck  LIVING ENVIRONMENT: Lives with: lives with their spouse -- 12 year old grandson stays with her Lives in: House/apartment Stairs: Yes: External: 12 steps; one rail Has following equipment at home: None  OCCUPATION: works full time-- Scientist, water quality-- sitting at a desk  PLOF: Independent  PATIENT GOALS: stability-- not feeling like it is going to give out  OBJECTIVE:   Note: Objective measures were completed at Evaluation unless otherwise noted.  DIAGNOSTIC FINDINGS: xray WNLs  PATIENT SURVEYS:  LEFS =57.5%   COGNITION: Overall cognitive status: Within functional limits for tasks assessed     SENSATION: WFL  EDEMA:  Mild edema noted around lateral mealleoli  MUSCLE LENGTH: Gastrocs tight bilat  PALPATION: No tenderness; note hypomobility subtallar region  LOWER EXTREMITY ROM: Active ROM Right eval Left eval  Hip flexion    Hip extension    Hip abduction    Hip adduction    Hip internal rotation    Hip external rotation    Knee flexion    Knee extension    Ankle dorsiflexion  90 (neutral)  Ankle plantarflexion    Ankle inversion  12 deg with pain  Ankle eversion     (Blank rows = not tested)  LOWER EXTREMITY MMT: MMT Right eval Left eval  Hip flexion 5/5 4/5  Hip extension    Hip abduction    Hip adduction    Hip internal rotation    Hip external rotation    Knee flexion 5/5 5/5  Knee extension 5/5 5/5  Ankle dorsiflexion 5/5 5/5  Ankle plantarflexion 4/5 4/5  Ankle inversion    Ankle eversion     (Blank rows = not tested) *rolls into ankle inversion with heel raises bilat  LOWER EXTREMITY SPECIAL TESTS:  Ankle special tests: Anterior drawer test: negative  FUNCTIONAL TESTS:  Single limb standing dec'd bilaterally  GAIT: Distance walked: 100 ft Assistive device utilized: None Level of assistance: Complete Independence Comments: ankle inversion bilat   [facility] Adult PT Treatment:                                                DATE: 09/18/23 Therapeutic Exercise: Calf stretch at wall x 30 sec  Seated calf raise with ball squeeze 2 x 10  Ankle inversion isometric 2 x 10; 5 sec Standing calf raise 2 x 10  Standing toe raise 2 x 10  Towel scrunches x 10  Seated ankle rockerboard SL x 10   Therapeutic Activity: Leg press 2 x 10, 65 lbs; SL  Sit to stand 2 x 10  [facility] Adult PT Treatment:                                                DATE: 09/11/23 Therapeutic Exercise: See HEP  PATIENT EDUCATION:  Education details: HEP review Person educated: Patient Education method: Explanation Education comprehension: verbalized understanding  HOME EXERCISE PROGRAM: Access Code: [email or web address] URL: [email or web address] Date: 09/11/2023 Prepared by: Tawni Ferrier  Exercises - Gastroc Stretch on Wall  - 1 x daily - 5 x weekly - 1 sets - 1 reps - 30 seconds hold - Seated Calf Raise With Small Ball at Heels  - 1 x daily - 5 x weekly - 1 sets - 10 reps - Isometric Ankle Inversion  - 1 x daily - 5 x weekly - 1 sets - 10 reps - Ankle Eversion with Resistance  - 1 x daily - 5 x weekly - 1 sets - 10 reps - Towel Scrunches  - 1 x daily - 5 x weekly - 1 sets - 10 reps  ASSESSMENT:  CLINICAL IMPRESSION: Reviewed HEP as patient has not completed since evaluation. Required cues for setup and proper performance of majority of these exercises. Introduced CKC strengthening as patient reports her biggest fear is falling with step down activity. She is challenged with controlling eccentric phase of sit to stand. No reports of ankle pain throughout session.   EVAL: Patient is a 58 y.o. female who was seen today for physical therapy evaluation and treatment for ankle sprain. She presents with reduced AROM, pain worse with ankle inversion (stretched position for lateral ankle), LE weakness, postural tightness in ankle (rolls into inversion), and a sensation of ankle instability. PT to address deficits to ensure return to full strength and improve function for IADLs.    OBJECTIVE IMPAIRMENTS: decreased activity tolerance, decreased ROM, decreased strength, hypomobility, increased edema, increased fascial restrictions, impaired flexibility, and pain.   ACTIVITY LIMITATIONS: stairs and  locomotion level  PARTICIPATION LIMITATIONS: community activity  PERSONAL FACTORS: 3+ comorbidities: see above are also affecting patient's functional outcome.   REHAB POTENTIAL: Good  CLINICAL DECISION MAKING: Evolving/moderate complexity  EVALUATION COMPLEXITY: Moderate   GOALS: Goals reviewed with patient? Yes  SHORT TERM GOALS: Target date: 08/12/23  The patient will be indep with initial HEP Baseline: initiated at eval Goal status: INITIAL  2. The patient will report no swelling at night.  Baseline:  edema late in the day Goal status: INITIAL  3.  The patient will demonstrate ability to negotiate steps reciprocal pattern. Baseline:  Reports ankle instability on steps and step to pattern Goal status: INITIAL   LONG TERM GOALS: Target date: 10/12/23  The patient will be indep with progression of HEP. Baseline:  initiated at eval Goal status: INITIAL  2.  The patient will improve LEFS by 12% to demonstrate improved functional abilities. Baseline: 57.5% Goal status: INITIAL  3.  The patient will improve ankle inversion to 18 degrees without pain L ankle to demo pain free ankle movement.  Baseline:  see above Goal status: INITIAL  4.  The patient will improve LE strength to 5/5 (hip flexion, ankle PF) in order to perform daily activities without sensation of instability.  Baseline:  see above Goal status: INITIAL  PLAN:  PT FREQUENCY: 1x/week  PT DURATION: 8 weeks  PLANNED INTERVENTIONS: 97164- PT Re-evaluation, 97110-Therapeutic exercises, 97530- Therapeutic activity, [id number]- Neuromuscular re-education,  02464- Self Care, 02859- Manual therapy, [id number]- Gait training, [phone]- Electrical stimulation (unattended), 97016- Vasopneumatic device, [phone]- Ionotophoresis 4mg /ml Dexamethasone , Patient/Family education, Stair training, Taping, Joint mobilization, and Cryotherapy  PLAN FOR NEXT SESSION: Progress ankle exercises-- single leg standing, foot/ankle mobility  exercises, hip flexor strengthening, LE flexibility, and stair negotiation. Progress CKC strengthening as tolerated.   Haylin Camilli, PT, DPT, ATC 09/18/23 11:44 AM

## 2023-09-23 ENCOUNTER — Encounter: Admitting: Physical Therapy

## 2023-09-29 ENCOUNTER — Telehealth: Payer: Self-pay

## 2023-09-29 ENCOUNTER — Other Ambulatory Visit (HOSPITAL_COMMUNITY): Payer: Self-pay

## 2023-09-29 NOTE — Telephone Encounter (Signed)
 Pharmacy Patient Advocate Encounter   Received notification from CoverMyMeds that prior authorization for Ozempic  2 is required/requested.   Insurance verification completed.   The patient is insured through [employer] .   Per test claim: per patient chart notes medication should be Mounjaro . Please advise

## 2023-10-02 ENCOUNTER — Encounter: Admitting: Rehabilitative and Restorative Service Providers"

## 2023-10-09 ENCOUNTER — Ambulatory Visit: Attending: Physician Assistant | Admitting: Rehabilitative and Restorative Service Providers"

## 2023-10-09 ENCOUNTER — Encounter: Payer: Self-pay | Admitting: Rehabilitative and Restorative Service Providers"

## 2023-10-09 DIAGNOSIS — M6281 Muscle weakness (generalized): Secondary | ICD-10-CM | POA: Diagnosis present

## 2023-10-09 DIAGNOSIS — M25572 Pain in left ankle and joints of left foot: Secondary | ICD-10-CM | POA: Diagnosis present

## 2023-10-09 NOTE — Therapy (Addendum)
 OUTPATIENT PHYSICAL THERAPY LOWER EXTREMITY TREATMENT and ON HOLD NOTE/ DISCHARGE SUMMARY   Patient Name: Jennifer Rich MRN: [id number] DOB:01-15-1966, 58 y.o., female Today's Date: 10/09/2023   PHYSICAL THERAPY DISCHARGE SUMMARY  Visits from Start of Care: 3  Current functional level related to goals / functional outcomes: SEE GOALS BELOW   Remaining deficits: No pain, still some weakness-- recommended continued HEP   Education / Equipment: HEP, shoewear.   Patient agrees to discharge. Patient goals were partially met. Patient is being discharged due to meeting the stated rehab goals.  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   BLASTOCYSTIS HOMINIS     CAP (community acquired pneumonia) 04/17/2015   Cervical cancer screening 01/25/2013   Chicken pox as a child   Fatty liver    Goiter    Mildly enlarged thyroid  TSH normal    Hepatitis C    was treated in 2022   Hyperglycemia    Hyperlipidemia    Hypertension    Obesity    Obesity, unspecified 04/26/2013   Pneumonia 05/02/2013   Preventative health care 12/18/2012   Has seen Dr Levora for GYN in past   Sleep apnea    Type II or unspecified type diabetes mellitus without mention of complication, not stated as uncontrolled    Vitamin D  deficiency    Past Surgical History:  Procedure Laterality Date   WISDOM TOOTH EXTRACTION  58 yrs old   Patient Active Problem List   Diagnosis Date Noted   Lesion of adrenal gland (HCC) 07/28/2022    Primary osteoarthritis of right shoulder 07/07/2022   Hepatitis C antibody test positive 11/20/2021   Obstructive sleep apnea syndrome 11/20/2021   Other fatigue 11/18/2021   SOB (shortness of breath) on exertion 11/18/2021   Health care maintenance 11/18/2021   Type 2 diabetes mellitus with obesity (HCC) 11/18/2021   Vitamin B 12 deficiency 11/18/2021   Class 3 severe obesity with serious comorbidity and body mass index (BMI) of 50.0 to 59.9 in adult 11/18/2021   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 (HCC) 07/23/2021   Syphilis 06/19/2021   Parotid mass 05/24/2021   Parotitis 05/15/2021   Tinnitus of right ear 05/15/2021   Fatty liver 04/15/2021   Hx of hepatitis C 04/15/2021   Essential hypertension, benign 11/02/2013   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   Anxiety and depression 01/30/2013   Cervical cancer screening 01/25/2013   Preventative health care 12/18/2012   Hyperlipidemia    Hyperglycemia    Vitamin D  deficiency    Goiter     PCP: Domenica Blackbird MD REFERRING PROVIDER: Gerome Maurilio HERO, PA-C  REFERRING DIAG: [phone] (ICD-10-CM) - Sprain of left ankle, unspecified ligament, initial encounter   THERAPY DIAG:  Pain in left ankle and joints of left foot  Muscle weakness (generalized)  Rationale for Evaluation and Treatment: Rehabilitation  ONSET DATE: 09/09/23  SUBJECTIVE:  SUBJECTIVE STATEMENT: The patient reports that she is not having any pain in her ankle. She feels steps have gotten easier.  PERTINENT HISTORY: HTN, sleep apnea, obesity, hypercholesterolemia PAIN:  Are you having pain? Yes: NPRS scale: 0/10 now Pain location: L  lateral ankle Pain description: soreness, weakness Aggravating factors: later In the day, stairs, prolonged walking Relieving factors: rest, early in the day  PRECAUTIONS: None  WEIGHT BEARING RESTRICTIONS: No  FALLS:  Has patient fallen in last 6 months? Yes. Number of falls 1 fell getting out of her husband's  truck  LIVING ENVIRONMENT: Lives with: lives with their spouse -- 35 year old grandson stays with her Lives in: House/apartment Stairs: Yes: External: 12 steps; one rail Has following equipment at home: None  OCCUPATION: works full time-- Scientist, water quality-- sitting at a desk  PLOF: Independent  PATIENT GOALS: stability-- not feeling like it is going to give out  OBJECTIVE:  Note: Objective measures were completed at Evaluation unless otherwise noted.  DIAGNOSTIC FINDINGS: xray WNLs  PATIENT SURVEYS:  LEFS =57.5%   COGNITION: Overall cognitive status: Within functional limits for tasks assessed     SENSATION: WFL  EDEMA:  Mild edema noted around lateral mealleoli  MUSCLE LENGTH: Gastrocs tight bilat  PALPATION: No tenderness; note hypomobility subtallar region  LOWER EXTREMITY ROM: Active ROM Right eval Left eval Left 10/09/23  Hip flexion     Hip extension     Hip abduction     Hip adduction     Hip internal rotation     Hip external rotation     Knee flexion     Knee extension     Ankle dorsiflexion  90 (neutral)   Ankle plantarflexion     Ankle inversion  12 deg with pain 18 deg No pain  Ankle eversion      (Blank rows = not tested)  LOWER EXTREMITY MMT: MMT Right eval Left eval Left 10/09/23  Hip flexion 5/5 4/5 5/5  Hip extension     Hip abduction     Hip adduction     Hip internal rotation     Hip external rotation     Knee flexion 5/5 5/5   Knee extension 5/5 5/5   Ankle dorsiflexion 5/5 5/5   Ankle plantarflexion 4/5 4/5 4/5 bilat  Ankle inversion     Ankle eversion      (Blank rows = not tested) *rolls into ankle inversion with heel raises bilat  LOWER EXTREMITY SPECIAL TESTS:  Ankle special tests: Anterior drawer test: negative  FUNCTIONAL TESTS:  Single limb standing dec'd bilaterally  GAIT: Distance walked: 100 ft Assistive device utilized: None Level of assistance: Complete Independence Comments: ankle inversion  bilat  [facility] Adult PT Treatment:                                                DATE: 10/09/23 Therapeutic Exercise: Ankle ROM 18 degrees L inversion Standing Single heel raises (for MMT) with one UE support-- L ankle dec'd ROM as compared to R Gastroc stretch wall x 2 reps x 30 seconds Heel raises with ball squeeze  Seated Toe yoga with cues for inversion/eversion to initiate movement then keeping ankle still Manual Therapy: Self massage gastroc with massage stick Discussed tennis ball and pastry roller Neuromuscular re-ed: Single limb balance for  ankle proprioception L side harder than R  Gait: Stair negotiation no handrails with reciprocal pattern mod indep   [facility] Adult PT Treatment:                                                DATE: 09/18/23 Therapeutic Exercise: Calf stretch at wall x 30 sec  Seated calf raise with ball squeeze 2 x 10  Ankle inversion isometric 2 x 10; 5 sec Standing calf raise 2 x 10  Standing toe raise 2 x 10  Towel scrunches x 10  Seated ankle rockerboard SL x 10  Therapeutic Activity: Leg press 2 x 10, 65 lbs; SL  Sit to stand 2 x 10    PATIENT EDUCATION:  Education details: HEP review Person educated: Patient Education method: Explanation Education comprehension: verbalized understanding  HOME EXERCISE PROGRAM: Access Code: [email or web address] URL: [email or web address] Date: 10/09/2023 Prepared by: Tawni Ferrier  Exercises - Gastroc Stretch on Wall  - 1 x daily - 5 x weekly - 1 sets - 2 reps - 30 seconds hold - Standing Calf Raise With Small Ball at Heels  - 1 x daily - 5 x weekly - 1 sets - 10 reps - Single Leg Stance with Support  - 2 x daily - 5 x weekly - 1 sets - 3 reps - 10-15 seconds hold - Toe Yoga - Alternating Great Toe and Lesser Toe Extension  - 2 x daily - 5 x weekly - 1 sets - 10 reps  ASSESSMENT:  CLINICAL IMPRESSION: The patient met STGs and partially met all LTGs. She wants to try HEP independently and will return  to PT in next 2-3 weeks if she has any issues. We discussed continued weakness in her ankles can predispose her to future injury so current HEP will allow continued strengthening. Modified program for d/c.  Patient to return if needed in next 2-3 weeks.   EVAL: Patient is a 58 y.o. female who was seen today for physical therapy evaluation and treatment for ankle sprain. She presents with reduced AROM, pain worse with ankle inversion (stretched position for lateral ankle), LE weakness, postural tightness in ankle (rolls into inversion), and a sensation of ankle instability. PT to address deficits to ensure return to full strength and improve function for IADLs.    OBJECTIVE IMPAIRMENTS: decreased activity tolerance, decreased ROM, decreased strength, hypomobility, increased edema, increased fascial restrictions, impaired flexibility, and pain.   GOALS: Goals reviewed with patient? Yes  SHORT TERM GOALS: Target date: 08/12/23  The patient will be indep with initial HEP Baseline: initiated at eval Goal status: MET  2. The patient will report no swelling at night.  Baseline:  edema late in the day Goal status: MET  3.  The patient will demonstrate ability to negotiate steps reciprocal pattern. Baseline:  Reports ankle instability on steps and step to pattern Goal status: MET   LONG TERM GOALS: Target date: 10/12/23  The patient will be indep with progression of HEP. Baseline:  initiated at eval Goal status: MET  2.  The patient will improve LEFS by 12% to demonstrate improved functional abilities. Baseline: 57.5% Goal status: MET (80%) ON 10/09/23  3.  The patient will improve ankle inversion to 18 degrees without pain L ankle to demo pain free ankle movement.  Baseline:  see above Goal status: MET  4.  The  patient will improve LE strength to 5/5 (hip flexion, ankle PF) in order to perform daily activities without sensation of instability.  Baseline:  see above Goal status: PARTIALLY  MET  PLAN:  PT FREQUENCY: 1x/week  PT DURATION: 8 weeks  PLANNED INTERVENTIONS: 97164- PT Re-evaluation, 97110-Therapeutic exercises, 97530- Therapeutic activity, [id number]- Neuromuscular re-education, 97535- Self Care, 02859- Manual therapy, [id number]- Gait training, [phone]- Electrical stimulation (unattended), 97016- Vasopneumatic device, 97033- Ionotophoresis 4mg /ml Dexamethasone , Patient/Family education, Stair training, Taping, Joint mobilization, and Cryotherapy  PLAN FOR NEXT SESSION: Progress ankle exercises-- single leg standing, foot/ankle mobility exercises, hip flexor strengthening, LE flexibility, and stair negotiation. Progress CKC strengthening as tolerated.  On HOLD-- will return if needed -- plans to work on HEP and call if needed.  Manford Sprong, PT 10/09/23 11:21 AM

## 2023-10-16 ENCOUNTER — Ambulatory Visit

## 2023-10-28 ENCOUNTER — Other Ambulatory Visit: Payer: Self-pay | Admitting: Family Medicine

## 2023-10-28 DIAGNOSIS — E559 Vitamin D deficiency, unspecified: Secondary | ICD-10-CM

## 2023-12-14 ENCOUNTER — Other Ambulatory Visit (INDEPENDENT_AMBULATORY_CARE_PROVIDER_SITE_OTHER)

## 2023-12-14 ENCOUNTER — Ambulatory Visit (INDEPENDENT_AMBULATORY_CARE_PROVIDER_SITE_OTHER): Admitting: Family Medicine

## 2023-12-14 ENCOUNTER — Encounter: Payer: Self-pay | Admitting: Family Medicine

## 2023-12-14 VITALS — BP 135/47 | HR 77 | Temp 97.6°F | Resp 14 | Ht 67.0 in | Wt 330.6 lb

## 2023-12-14 DIAGNOSIS — E7849 Other hyperlipidemia: Secondary | ICD-10-CM

## 2023-12-14 DIAGNOSIS — R739 Hyperglycemia, unspecified: Secondary | ICD-10-CM | POA: Diagnosis not present

## 2023-12-14 DIAGNOSIS — E538 Deficiency of other specified B group vitamins: Secondary | ICD-10-CM

## 2023-12-14 DIAGNOSIS — M25562 Pain in left knee: Secondary | ICD-10-CM

## 2023-12-14 DIAGNOSIS — E559 Vitamin D deficiency, unspecified: Secondary | ICD-10-CM | POA: Diagnosis not present

## 2023-12-14 DIAGNOSIS — I10 Essential (primary) hypertension: Secondary | ICD-10-CM

## 2023-12-14 DIAGNOSIS — E119 Type 2 diabetes mellitus without complications: Secondary | ICD-10-CM

## 2023-12-14 DIAGNOSIS — Z0000 Encounter for general adult medical examination without abnormal findings: Secondary | ICD-10-CM

## 2023-12-14 DIAGNOSIS — E669 Obesity, unspecified: Secondary | ICD-10-CM

## 2023-12-14 DIAGNOSIS — G8929 Other chronic pain: Secondary | ICD-10-CM

## 2023-12-14 MED ORDER — DICLOFENAC SODIUM 1 % EX GEL: 2.0000 g | Freq: Four times a day (QID) | CUTANEOUS | 2 refills | Status: DC

## 2023-12-14 MED ORDER — ATORVASTATIN CALCIUM 10 MG PO TABS: 10.0000 mg | ORAL_TABLET | Freq: Every day | ORAL | 1 refills | Status: DC

## 2023-12-14 NOTE — Assessment & Plan Note (Signed)
 Supplement and monitor

## 2023-12-14 NOTE — Assessment & Plan Note (Signed)
Blood pressure is at goal for age and co-morbidities.   Recommendations: continue current regimen - BP goal <130/80 - monitor and log blood pressures at home - check around the same time each day in a relaxed setting - Limit salt to <2000 mg/day - Follow DASH eating plan (heart healthy diet) - limit alcohol to 2 standard drinks per day for men and 1 per day for women - avoid tobacco products - get at least 2 hours of regular aerobic exercise weekly Patient aware of signs/symptoms requiring further/urgent evaluation. Labs updated today.  

## 2023-12-14 NOTE — Assessment & Plan Note (Signed)
 Current management with Mounjaro  2.5 mg. Monitoring A1c levels to determine if dosage adjustment is necessary. - Monitor A1c levels and adjust Mounjaro  dosage if A1c increases.

## 2023-12-14 NOTE — Progress Notes (Signed)
 Acute Office Visit  Subjective:     Patient ID: Jennifer Rich, female    DOB: 1965/09/03, 58 y.o.   MRN: [id number]  Chief Complaint  Patient presents with   left leg pain-when sitting    HPI Patient is in today for left knee pain.   Discussed the use of AI scribe software for clinical note transcription with the patient, who gave verbal consent to proceed.  History of Present Illness Jennifer Rich is a 58 year old female with a history of mild arthritis in the left knee who presents with left knee stiffness and pain.  She has been experiencing stiffness and pain in her left knee for the past couple of months. The joint feels 'stiff' and 'locked', particularly after sitting for extended periods. The stiffness is primarily in the joints rather than the muscles. Pain is significant when she first stands up, requiring her to 'shake it out' and walk around to alleviate the discomfort. Symptoms improve with movement, but sitting for long periods, such as during road trips, exacerbates the stiffness.  She recalls a previous diagnosis of mild arthritis in her left knee from an X-ray in 2016. There have been no recent injuries to the knee. No swelling, redness, or heat in the area and no popping sounds or the knee giving out.  Her current medications include Lipitor 10 mg for cholesterol, losartan  and hydrochlorothiazide  for blood pressure, spironolactone  twice a day, Mounjaro  2.5 mg, and vitamin D  once a week.  She works from home and often sits for long periods, which contributes to her knee stiffness.          ROS All review of systems negative except what is listed in the HPI      Objective:    BP (!) 135/47   Pulse 77   Temp 97.6 F (36.4 C) (Oral)   Resp 14   Ht 5' 7 (1.702 m)   Wt (!) 330 lb 9.6 oz (150 kg)   LMP 09/14/2017   SpO2 96%   BMI 51.78 kg/m     Physical Exam Vitals reviewed.  Constitutional:      Appearance: Normal appearance. She  is obese.  Cardiovascular:     Rate and Rhythm: Normal rate and regular rhythm.     Heart sounds: Normal heart sounds.  Pulmonary:     Effort: Pulmonary effort is normal.     Breath sounds: Normal breath sounds.  Musculoskeletal:        General: No swelling or tenderness. Normal range of motion.     Right lower leg: No edema.     Left lower leg: No edema.     Comments: Negative Homans sign  Skin:    General: Skin is warm and dry.  Neurological:     Mental Status: She is alert and oriented to person, place, and time.  Psychiatric:        Mood and Affect: Mood normal.        Behavior: Behavior normal.        Thought Content: Thought content normal.        Judgment: Judgment normal.         No results found for any visits on 12/14/23.      Assessment & Plan:   Problem List Items Addressed This Visit       Active Problems   Hyperlipidemia   -Medication management: continue Lipitor -Labs today -Diet low in saturated fat -Regular exercise - at least  30 minutes, 5 times per week       Relevant Medications   atorvastatin  (LIPITOR) 10 MG tablet   Vitamin D  deficiency   Supplement and monitor       Essential hypertension, benign   Blood pressure is at goal for age and co-morbidities.   Recommendations: continue current regimen - BP goal <130/80 - monitor and log blood pressures at home - check around the same time each day in a relaxed setting - Limit salt to <2000 mg/day - Follow DASH eating plan (heart healthy diet) - limit alcohol to 2 standard drinks per day for men and 1 per day for women - avoid tobacco products - get at least 2 hours of regular aerobic exercise weekly Patient aware of signs/symptoms requiring further/urgent evaluation. Labs updated today.       Relevant Medications   atorvastatin  (LIPITOR) 10 MG tablet   Type 2 diabetes mellitus in patient with obesity (HCC)    >>ASSESSMENT AND PLAN FOR HYPERGLYCEMIA WRITTEN ON 12/14/2023  2:39 PM  BY Ambermarie Honeyman B, NP  Current management with Mounjaro  2.5 mg. Monitoring A1c levels to determine if dosage adjustment is necessary. - Monitor A1c levels and adjust Mounjaro  dosage if A1c increases.      Relevant Medications   atorvastatin  (LIPITOR) 10 MG tablet   Vitamin B 12 deficiency   Supplement and monitor      Other Visit Diagnoses       Chronic pain of left knee    -  Primary Chronic stiffness and pain in the left knee, exacerbated by prolonged sitting, suggestive of worsening osteoarthritis. Movement improves symptoms by lubricating the joints. - Prescribe Voltaren gel for topical application up to four times a day. - Provide printed knee stretching exercises. - Advise daily walking and regular movement to lubricate joints. - Recommend considering a standing desk or desk adapter to alternate between sitting and standing. - If symptoms do not improve in one month, consider updating x-rays and referring to physical therapy. - Discuss potential referral to sports medicine for intra-articular injection if symptoms significantly worsen.    Relevant Medications   diclofenac Sodium (VOLTAREN) 1 % GEL        Lab Results  Component Value Date   HGBA1C 6.8 (H) 05/01/2023       Meds ordered this encounter  Medications   diclofenac Sodium (VOLTAREN) 1 % GEL    Sig: Apply 2 g topically 4 (four) times daily.    Dispense:  20 g    Refill:  2    Supervising Provider:   DOMENICA BLACKBIRD A [[id number]]   atorvastatin  (LIPITOR) 10 MG tablet    Sig: Take 1 tablet (10 mg total) by mouth daily.    Dispense:  90 tablet    Refill:  1    Supervising Provider:   DOMENICA BLACKBIRD LABOR [[id number]]    Return for routine PCP f/u in February as scheduled .  Jennifer Rich Mon, NP

## 2023-12-14 NOTE — Assessment & Plan Note (Signed)
>>  ASSESSMENT AND PLAN FOR HYPERGLYCEMIA WRITTEN ON 12/14/2023  2:39 PM BY Geddy Boydstun B, NP  Current management with Mounjaro  2.5 mg. Monitoring A1c levels to determine if dosage adjustment is necessary. - Monitor A1c levels and adjust Mounjaro  dosage if A1c increases.

## 2023-12-14 NOTE — Telephone Encounter (Signed)
 Lab orders entered. Front desk scheduled for lab appt, made aware to make sure she has virtual to discuss with PCP if cancels appt today.

## 2023-12-14 NOTE — Assessment & Plan Note (Signed)
-  Medication management: continue Lipitor -Labs today -Diet low in saturated fat -Regular exercise - at least 30 minutes, 5 times per week

## 2023-12-15 ENCOUNTER — Ambulatory Visit: Payer: Self-pay | Admitting: Family Medicine

## 2023-12-15 DIAGNOSIS — E7849 Other hyperlipidemia: Secondary | ICD-10-CM

## 2023-12-15 LAB — LIPID PANEL
Cholesterol: 211 mg/dL — ABNORMAL HIGH (ref 0–200)
HDL: 41.1 mg/dL (ref >39.00)
LDL Cholesterol: 150 mg/dL — ABNORMAL HIGH (ref 0–99)
NonHDL: 169.76
Total CHOL/HDL Ratio: 5
Triglycerides: 99 mg/dL (ref 0.0–149.0)
VLDL: 19.8 mg/dL (ref 0.0–40.0)

## 2023-12-15 LAB — CBC WITH DIFFERENTIAL/PLATELET
Basophils Absolute: 0 K/uL (ref 0.0–0.1)
Basophils Relative: 0.7 % (ref 0.0–3.0)
Eosinophils Absolute: 0.2 K/uL (ref 0.0–0.7)
Eosinophils Relative: 2.6 % (ref 0.0–5.0)
HCT: 41.5 % (ref 36.0–46.0)
Hemoglobin: 13.2 g/dL (ref 12.0–15.0)
Lymphocytes Relative: 44.1 % (ref 12.0–46.0)
Lymphs Abs: 2.6 K/uL (ref 0.7–4.0)
MCHC: 31.9 g/dL (ref 30.0–36.0)
MCV: 76.5 fl — ABNORMAL LOW (ref 78.0–100.0)
Monocytes Absolute: 0.4 K/uL (ref 0.1–1.0)
Monocytes Relative: 6.2 % (ref 3.0–12.0)
Neutro Abs: 2.7 K/uL (ref 1.4–7.7)
Neutrophils Relative %: 46.4 % (ref 43.0–77.0)
Platelets: 214 K/uL (ref 150.0–400.0)
RBC: 5.43 Mil/uL — ABNORMAL HIGH (ref 3.87–5.11)
RDW: 15.3 % (ref 11.5–15.5)
WBC: 5.8 K/uL (ref 4.0–10.5)

## 2023-12-15 LAB — COMPREHENSIVE METABOLIC PANEL WITH GFR
ALT: 9 U/L (ref 0–35)
AST: 16 U/L (ref 0–37)
Albumin: 4.1 g/dL (ref 3.5–5.2)
Alkaline Phosphatase: 86 U/L (ref 39–117)
BUN: 15 mg/dL (ref 6–23)
CO2: 30 meq/L (ref 19–32)
Calcium: 9.3 mg/dL (ref 8.4–10.5)
Chloride: 101 meq/L (ref 96–112)
Creatinine, Ser: 1.12 mg/dL (ref 0.40–1.20)
GFR: 54.49 mL/min — ABNORMAL LOW (ref >60.00)
Glucose, Bld: 90 mg/dL (ref 70–99)
Potassium: 4 meq/L (ref 3.5–5.1)
Sodium: 138 meq/L (ref 135–145)
Total Bilirubin: 1 mg/dL (ref 0.2–1.2)
Total Protein: 7.4 g/dL (ref 6.0–8.3)

## 2023-12-15 LAB — TSH: TSH: 1.61 u[IU]/mL (ref 0.35–5.50)

## 2023-12-15 LAB — HEMOGLOBIN A1C: Hgb A1c MFr Bld: 6.7 % — ABNORMAL HIGH (ref 4.6–6.5)

## 2023-12-15 LAB — VITAMIN B12: Vitamin B-12: 350 pg/mL (ref 211–911)

## 2023-12-15 LAB — VITAMIN D 25 HYDROXY (VIT D DEFICIENCY, FRACTURES): VITD: 22.63 ng/mL — ABNORMAL LOW (ref 30.00–100.00)

## 2023-12-15 MED ORDER — ATORVASTATIN CALCIUM 20 MG PO TABS: 20.0000 mg | ORAL_TABLET | Freq: Every day | ORAL | 1 refills | Status: AC

## 2023-12-20 ENCOUNTER — Other Ambulatory Visit: Payer: Self-pay | Admitting: Family Medicine

## 2023-12-20 DIAGNOSIS — I10 Essential (primary) hypertension: Secondary | ICD-10-CM

## 2023-12-21 ENCOUNTER — Encounter: Payer: Self-pay | Admitting: Family Medicine

## 2024-01-04 ENCOUNTER — Encounter: Payer: Self-pay | Admitting: Radiology

## 2024-01-24 ENCOUNTER — Other Ambulatory Visit: Payer: Self-pay

## 2024-01-24 ENCOUNTER — Encounter (HOSPITAL_BASED_OUTPATIENT_CLINIC_OR_DEPARTMENT_OTHER): Payer: Self-pay

## 2024-01-24 ENCOUNTER — Emergency Department (HOSPITAL_BASED_OUTPATIENT_CLINIC_OR_DEPARTMENT_OTHER)

## 2024-01-24 ENCOUNTER — Emergency Department (HOSPITAL_BASED_OUTPATIENT_CLINIC_OR_DEPARTMENT_OTHER)
Admission: EM | Admit: 2024-01-24 | Discharge: 2024-01-24 | Disposition: A | Discharge status: Home / Self Care | Attending: Emergency Medicine | Admitting: Emergency Medicine

## 2024-01-24 DIAGNOSIS — Y9301 Activity, walking, marching and hiking: Secondary | ICD-10-CM | POA: Insufficient documentation

## 2024-01-24 DIAGNOSIS — Z79899 Other long term (current) drug therapy: Secondary | ICD-10-CM | POA: Insufficient documentation

## 2024-01-24 DIAGNOSIS — S8991XA Unspecified injury of right lower leg, initial encounter: Secondary | ICD-10-CM | POA: Diagnosis present

## 2024-01-24 DIAGNOSIS — I10 Essential (primary) hypertension: Secondary | ICD-10-CM | POA: Diagnosis not present

## 2024-01-24 DIAGNOSIS — M179 Osteoarthritis of knee, unspecified: Secondary | ICD-10-CM | POA: Insufficient documentation

## 2024-01-24 DIAGNOSIS — X509XXA Other and unspecified overexertion or strenuous movements or postures, initial encounter: Secondary | ICD-10-CM | POA: Insufficient documentation

## 2024-01-24 DIAGNOSIS — E119 Type 2 diabetes mellitus without complications: Secondary | ICD-10-CM | POA: Insufficient documentation

## 2024-01-24 MED ORDER — ACETAMINOPHEN 500 MG PO TABS
500.0000 mg | ORAL_TABLET | Freq: Once | ORAL | Status: AC
  Administered 2024-01-24: 500 mg via ORAL
  Filled 2024-01-24: qty 1

## 2024-01-24 NOTE — ED Triage Notes (Signed)
 Pt was walking up the stairs this AM and pulled something behind right knee. Pt states she only has pain when walking or bearing weight.

## 2024-01-24 NOTE — Discharge Instructions (Addendum)
 You were seen in the emergency department today for concerns of a leg injury.  The x-ray of your right knee was negative for any acute findings but there are some signs of arthritis.  I suspect based on how you injured your knee, this is likely due to a strain of your hamstring.  I would recommend icing the area and using Tylenol  ibuprofen  for pain.  Follow-up closely with your primary care fighter.  For any concerns of new or worsening symptoms, return to the emergency department. If you feel that your symptoms are not improving, please follow up with orthopedics.

## 2024-01-24 NOTE — ED Provider Notes (Signed)
 [place] [facility] AT [facility] Provider Note   CSN: [id number] Arrival date & time: 01/24/24  1258     Patient presents with: Leg Injury   Jennifer Rich is a 58 y.o. female.  Patient with past history significant for type 2 diabetes, hypertension, hyperlipidemia, obesity here with concerns of leg injury.  She reportedly was try to walk up some steps this morning on her way to church when she ultimately pulled behind her knee.  She states that she only has pain when trying to walk and bear weight but denies any pain at rest.  Denies any falls or twisting of her knee.  Endorses pain is to the posterior aspect of [place].  No medications taken prior to arriving.   HPI     Prior to Admission medications   Medication Sig Start Date End Date Taking? Authorizing Provider  albuterol  (VENTOLIN  HFA) 108 (90 Base) MCG/ACT inhaler Inhale 1-2 puffs into the lungs every 6 (six) hours as needed for wheezing or shortness of breath. 01/22/21   Nanavati, Ankit, MD  albuterol  (VENTOLIN  HFA) 108 (90 Base) MCG/ACT inhaler TAKE 2 PUFFS BY MOUTH EVERY 6 HOURS AS NEEDED 07/02/23   Saguier, Dallas, PA-C  ALPRAZolam  (XANAX ) 0.25 MG tablet Take 1 tablet (0.25 mg total) by mouth 2 (two) times daily as needed for anxiety. 05/01/23   Almarie Waddell NOVAK, NP  atorvastatin  (LIPITOR) 20 MG tablet Take 1 tablet (20 mg total) by mouth daily. 12/15/23   Almarie Waddell NOVAK, NP  azelastine  (ASTELIN ) 0.1 % nasal spray Place 2 sprays into both nostrils 2 (two) times daily. Use in each nostril as directed 06/05/23   Saguier, Dallas, PA-C  benzonatate  (TESSALON ) 100 MG capsule Take 1 capsule (100 mg total) by mouth 3 (three) times daily as needed for cough. 06/05/23   Saguier, [place], PA-C  Continuous Glucose Receiver (FREESTYLE LIBRE 3 READER) DEVI Check blood sugar 12/31/22   Domenica Harlene DELENA, MD  Continuous Glucose Sensor (FREESTYLE LIBRE 3 SENSOR) MISC Place 1 sensor on the skin every 14 days. Use to check  glucose continuously 12/16/22   Domenica Harlene DELENA, MD  cyclobenzaprine  (FLEXERIL ) 10 MG tablet Take 1 tablet (10 mg total) by mouth 2 (two) times daily as needed for muscle spasms. 06/11/23   Yolande Lamar BROCKS, MD  diclofenac  Sodium (VOLTAREN ) 1 % GEL Apply 2 g topically 4 (four) times daily. 12/14/23   Almarie Waddell B, NP  glucose blood (FREESTYLE TEST STRIPS) test strip Use as instructed 12/16/22   Domenica Harlene DELENA, MD  Lancets (FREESTYLE) lancets Use as instructed 12/16/22   Domenica Harlene DELENA, MD  losartan -hydrochlorothiazide  (HYZAAR ) 100-25 MG tablet Take 1 tablet by mouth daily. 05/01/23   Almarie Waddell NOVAK, NP  olopatadine  (PATANOL) 0.1 % ophthalmic solution Place 1 drop into both eyes 2 (two) times daily. 06/09/23   Saguier, Dallas, PA-C  omeprazole  (PRILOSEC) 20 MG capsule Take 1 capsule (20 mg total) by mouth daily. 12/16/22   Rancour, Garnette, MD  spironolactone  (ALDACTONE ) 25 MG tablet TAKE 1 TABLET BY MOUTH TWICE A DAY 12/21/23   Domenica Harlene DELENA, MD  tirzepatide  (MOUNJARO ) 2.5 MG/0.5ML Pen Inject 2.5 mg into the skin once a week. 06/25/23   Almarie Waddell NOVAK, NP  valACYclovir  (VALTREX ) 500 MG tablet Take 500 mg by mouth as needed. 05/20/21   [provider]  Vitamin D , Ergocalciferol , (DRISDOL ) 1.25 MG (50000 UNIT) CAPS capsule TAKE 1 CAPSULE BY MOUTH EVERY 7 DAYS 10/28/23   Domenica,  Harlene LABOR, MD    Allergies: Metoprolol  and Lisinopril    Review of Systems  Musculoskeletal:        Knee pain  All other systems reviewed and are negative.   Updated Vital Signs BP 136/89 (BP Location: Right Arm)   Pulse 92   Temp 98.1 F (36.7 C) (Oral)   Resp 16   Ht 5' 7 (1.702 m)   Wt (!) 142.9 kg   LMP 09/14/2017   SpO2 95%   BMI 49.34 kg/m   Physical Exam Vitals and nursing note reviewed.  Constitutional:      General: She is not in acute distress.    Appearance: She is well-developed.  HENT:     Head: Normocephalic and atraumatic.  Eyes:     Conjunctiva/sclera: Conjunctivae normal.   Cardiovascular:     Rate and Rhythm: Normal rate and regular rhythm.     Heart sounds: No murmur heard. Pulmonary:     Effort: Pulmonary effort is normal. No respiratory distress.     Breath sounds: Normal breath sounds.  Abdominal:     Palpations: Abdomen is soft.     Tenderness: There is no abdominal tenderness.  Musculoskeletal:        General: Tenderness present. No swelling, deformity or signs of injury. Normal range of motion.     Cervical back: Neck supple.       Legs:     Comments: Tenderness to palpation along the right inferomedial aspect of the posterior knee.  No palpable mass, swelling, or deformity.  No bruising seen in this area.  Pain worsens with full knee extension and flexion.  Skin:    General: Skin is warm and dry.     Capillary Refill: Capillary refill takes less than 2 seconds.  Neurological:     Mental Status: She is alert.  Psychiatric:        Mood and Affect: Mood normal.     (all labs ordered are listed, but only abnormal results are displayed) Labs Reviewed - No data to display  EKG: None  Radiology: DG Knee Complete 4 Views Right Result Date: 01/24/2024 CLINICAL DATA:  Right knee pain. EXAM: RIGHT KNEE - COMPLETE 4+ VIEW COMPARISON:  Right knee radiograph dated 04/30/2012. FINDINGS: No acute fracture or dislocation. The bones are well mineralized. Moderate tricompartmental narrowing and spurring. No joint effusion. The soft tissues are unremarkable. IMPRESSION: 1. No acute fracture or dislocation. 2. Moderate tricompartmental osteoarthritis. Electronically Signed   By: Vanetta Chou M.D.   On: 01/24/2024 13:54     Procedures   Medications Ordered in the ED  acetaminophen  (TYLENOL ) tablet 500 mg (500 mg Oral Given 01/24/24 1359)                                    Medical Decision Making Amount and/or Complexity of Data Reviewed Radiology: ordered.  Risk OTC drugs.   This patient presents to the ED for concern of knee pain.   Differential diagnosis includes hamstring strain, quadricep strain, patellar dislocation, joint effusion, meniscal injury.    Additional history obtained:  Additional history obtained from chart review   Imaging Studies ordered:  I ordered imaging studies including x-ray of the right knee I independently visualized and interpreted imaging which showed no acute fracture or dislocation. 2. Moderate tricompartmental osteoarthritis. I agree with the radiologist interpretation   Medicines ordered and prescription drug management:  I ordered  medication including Tylenol  for pain Reevaluation of the patient after these medicines showed that the patient improved I have reviewed the patients home medicines and have made adjustments as needed   Problem List / ED Course:  Patient presented to the emergency department with concerns of right leg injury.  She reports that she was walking up steps earlier today when she felt a strain in the back of her right knee.  Denies any falls or impact.  States that she is able to bear weight but has pain with full extension or flexion of the right knee. On exam, patient has some tenderness primarily towards the posterior medial aspect of the right knee.  No obvious swelling or deformity.  No bruising or swelling is seen. X-ray of the right knee is obtained which shows no acute fracture or dislocation.  There is some moderate tricompartmental osteoarthritis.  This may be a flareup of her her osteoarthritis but I suspect likely hamstring strain given mechanism.  Advise weightbearing as tolerated.  NSAIDs, ice, and elevation as needed for pain.  Encourage close follow-up with PCP and outpatient follow-up with orthopedics if pain is not improved within the next 1 to 2 weeks.  She is otherwise stable for outpatient follow-up and discharged home.   Social Determinants of Health:  None  Final diagnoses:  Injury of right knee, initial encounter    ED Discharge  Orders     None          Jennifer Legrand LABOR, PA-C 01/24/24 1436    Mannie Pac T, DO 02/02/24 0701

## 2024-02-01 ENCOUNTER — Other Ambulatory Visit: Payer: Self-pay | Admitting: Family

## 2024-02-01 ENCOUNTER — Other Ambulatory Visit: Payer: Self-pay | Admitting: Family Medicine

## 2024-02-01 DIAGNOSIS — E669 Obesity, unspecified: Secondary | ICD-10-CM

## 2024-02-01 MED ORDER — TIRZEPATIDE 5 MG/0.5ML ~~LOC~~ SOAJ: 5.0000 mg | SUBCUTANEOUS | 1 refills | Status: AC

## 2024-02-10 ENCOUNTER — Telehealth: Admitting: Physician Assistant

## 2024-02-10 DIAGNOSIS — B9689 Other specified bacterial agents as the cause of diseases classified elsewhere: Secondary | ICD-10-CM

## 2024-02-10 DIAGNOSIS — J208 Acute bronchitis due to other specified organisms: Secondary | ICD-10-CM

## 2024-02-10 MED ORDER — ALBUTEROL SULFATE HFA 108 (90 BASE) MCG/ACT IN AERS: 1.0000 | INHALATION_SPRAY | Freq: Four times a day (QID) | RESPIRATORY_TRACT | 0 refills | Status: AC | PRN

## 2024-02-10 MED ORDER — AZITHROMYCIN 250 MG PO TABS: ORAL_TABLET | ORAL | 0 refills | Status: AC

## 2024-02-10 MED ORDER — GUAIFENESIN ER 600 MG PO TB12: 600.0000 mg | ORAL_TABLET | Freq: Two times a day (BID) | ORAL | 0 refills | Status: DC | PRN

## 2024-02-10 MED ORDER — PROMETHAZINE-DM 6.25-15 MG/5ML PO SYRP: 5.0000 mL | ORAL_SOLUTION | Freq: Four times a day (QID) | ORAL | 0 refills | Status: AC | PRN

## 2024-02-10 NOTE — Progress Notes (Signed)
 Virtual Visit Consent   Jackolyn DELENA Norrie, you are scheduled for a virtual visit with a  provider today. Just as with appointments in the office, your consent must be obtained to participate. Your consent will be active for this visit and any virtual visit you may have with one of our providers in the next 365 days. If you have a MyChart account, a copy of this consent can be sent to you electronically.  As this is a virtual visit, video technology does not allow for your provider to perform a traditional examination. This may limit your provider's ability to fully assess your condition. If your provider identifies any concerns that need to be evaluated in person or the need to arrange testing (such as labs, EKG, etc.), we will make arrangements to do so. Although advances in technology are sophisticated, we cannot ensure that it will always work on either your end or our end. If the connection with a video visit is poor, the visit may have to be switched to a telephone visit. With either a video or telephone visit, we are not always able to ensure that we have a secure connection.  By engaging in this virtual visit, you consent to the provision of healthcare and authorize for your insurance to be billed (if applicable) for the services provided during this visit. Depending on your insurance coverage, you may receive a charge related to this service.  I need to obtain your verbal consent now. Are you willing to proceed with your visit today? MACI EICKHOLT has provided verbal consent on 02/10/2024 for a virtual visit (video or telephone). Delon CHRISTELLA Dickinson, PA-C  Date: 02/10/2024 10:29 AM   Virtual Visit via Video Note   I, Delon CHRISTELLA Dickinson, connected with  PARMINDER TRAPANI  ([id number], Jan 13, 1966) on 02/10/24 at 10:15 AM EST by a video-enabled telemedicine application and verified that I am speaking with the correct person using two identifiers.  Location: Patient:  Virtual Visit Location Patient: Home Provider: Virtual Visit Location Provider: Home Office   I discussed the limitations of evaluation and management by telemedicine and the availability of in person appointments. The patient expressed understanding and agreed to proceed.    History of Present Illness: TYRESA PRINDIVILLE is a 58 y.o. who identifies as a female who was assigned female at birth, and is being seen today for cough and congestion.  HPI: URI  This is a new problem. The current episode started yesterday. The problem has been gradually worsening. Maximum temperature: subjective fever with chills, temp was 99. Associated symptoms include congestion, coughing (having brownish discharge come up but small amount), rhinorrhea (mild), sneezing and wheezing (mild). Pertinent negatives include no chest pain, diarrhea, ear pain, headaches, nausea, plugged ear sensation, sinus pain, sore throat or vomiting. She has tried acetaminophen  (Mucinex  fast max) for the symptoms. The treatment provided no relief.     Problems:  Patient Active Problem List   Diagnosis Date Noted   Lesion of adrenal gland 07/28/2022   Primary osteoarthritis of right shoulder 07/07/2022   Hepatitis C antibody test positive 11/20/2021   Obstructive sleep apnea syndrome 11/20/2021   Other fatigue 11/18/2021   SOB (shortness of breath) on exertion 11/18/2021   Health care maintenance 11/18/2021   Type 2 diabetes mellitus in patient with obesity (HCC) 11/18/2021   Vitamin B 12 deficiency 11/18/2021   Class 3 severe obesity with serious comorbidity and body mass index (BMI) of 50.0 to 59.9 in adult [facility]) 11/18/2021  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 07/23/2021   Syphilis 06/19/2021   Parotid mass 05/24/2021   Parotitis 05/15/2021   Tinnitus of right ear 05/15/2021   Fatty liver 04/15/2021   Hx of hepatitis C 04/15/2021   Essential hypertension, benign 11/02/2013   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    Anxiety and depression 01/30/2013   Cervical cancer screening 01/25/2013   Preventative health care 12/18/2012   Hyperlipidemia    Vitamin D  deficiency    Goiter     Allergies:  Allergies  Allergen Reactions   Metoprolol  Other (See Comments)   Lisinopril Cough and Other (See Comments)   Medications:  Current Outpatient Medications:    albuterol  (VENTOLIN  HFA) 108 (90 Base) MCG/ACT inhaler, Inhale 1-2 puffs into the lungs every 6 (six) hours as needed., Disp: 8 g, Rfl: 0   ALPRAZolam  (XANAX ) 0.25 MG tablet, Take 1 tablet (0.25 mg total) by mouth 2 (two) times daily as needed for anxiety., Disp: 10 tablet, Rfl: 0   atorvastatin  (LIPITOR) 20 MG tablet, Take 1 tablet (20 mg total) by mouth daily., Disp: 90 tablet, Rfl: 1   azelastine  (ASTELIN ) 0.1 % nasal spray, Place 2 sprays into both nostrils 2 (two) times daily. Use in each nostril as directed, Disp: 30 mL, Rfl: 1   azithromycin  (ZITHROMAX ) 250 MG tablet, Take 2 tablets on day 1, then 1 tablet daily on days 2 through 5, Disp: 6 tablet, Rfl: 0   benzonatate  (TESSALON ) 100 MG capsule, Take 1 capsule (100 mg total) by mouth 3 (three) times daily as needed for cough., Disp: 30 capsule, Rfl: 0   Continuous Glucose Receiver (FREESTYLE LIBRE 3 READER) DEVI, Check blood sugar, Disp: 1 each, Rfl: 0   Continuous Glucose Sensor (FREESTYLE LIBRE 3 SENSOR) MISC, Place 1 sensor on the skin every 14 days. Use to check glucose continuously, Disp: 2 each, Rfl: 2   cyclobenzaprine  (FLEXERIL ) 10 MG tablet, Take 1 tablet (10 mg total) by mouth 2 (two) times daily as needed for muscle spasms., Disp: 20 tablet, Rfl: 0   diclofenac  Sodium (VOLTAREN ) 1 % GEL, Apply 2 g topically 4 (four) times daily., Disp: 20 g, Rfl: 2   glucose blood (FREESTYLE TEST STRIPS) test strip, Use as instructed, Disp: 100 each, Rfl: 12   guaiFENesin  (MUCINEX ) 600 MG 12 hr tablet, Take 1 tablet (600 mg total) by mouth 2 (two) times daily as needed., Disp: 30 tablet, Rfl: 0   Lancets  (FREESTYLE) lancets, Use as instructed, Disp: 100 each, Rfl: 12   losartan -hydrochlorothiazide  (HYZAAR ) 100-25 MG tablet, Take 1 tablet by mouth daily., Disp: 90 tablet, Rfl: 1   olopatadine  (PATANOL) 0.1 % ophthalmic solution, Place 1 drop into both eyes 2 (two) times daily., Disp: 5 mL, Rfl: 12   omeprazole  (PRILOSEC) 20 MG capsule, Take 1 capsule (20 mg total) by mouth daily., Disp: 30 capsule, Rfl: 0   promethazine -dextromethorphan (PROMETHAZINE -DM) 6.25-15 MG/5ML syrup, Take 5 mLs by mouth 4 (four) times daily as needed., Disp: 118 mL, Rfl: 0   spironolactone  (ALDACTONE ) 25 MG tablet, TAKE 1 TABLET BY MOUTH TWICE A DAY, Disp: 180 tablet, Rfl: 0   tirzepatide  (MOUNJARO ) 5 MG/0.5ML Pen, Inject 5 mg into the skin once a week., Disp: 6 mL, Rfl: 1   valACYclovir  (VALTREX ) 500 MG tablet, Take 500 mg by mouth as needed., Disp: , Rfl:    Vitamin D , Ergocalciferol , (DRISDOL ) 1.25 MG (50000 UNIT) CAPS capsule, TAKE 1 CAPSULE BY MOUTH EVERY 7 DAYS, Disp: 4  capsule, Rfl: 0  Observations/Objective: Patient is well-developed, well-nourished in no acute distress.  Resting comfortably at home.  Head is normocephalic, atraumatic.  No labored breathing.  Speech is clear and coherent with logical content.  Patient is alert and oriented at baseline.    Assessment and Plan: 1. Acute bacterial bronchitis (Primary) - azithromycin  (ZITHROMAX ) 250 MG tablet; Take 2 tablets on day 1, then 1 tablet daily on days 2 through 5  Dispense: 6 tablet; Refill: 0 - albuterol  (VENTOLIN  HFA) 108 (90 Base) MCG/ACT inhaler; Inhale 1-2 puffs into the lungs every 6 (six) hours as needed.  Dispense: 8 g; Refill: 0 - promethazine -dextromethorphan (PROMETHAZINE -DM) 6.25-15 MG/5ML syrup; Take 5 mLs by mouth 4 (four) times daily as needed.  Dispense: 118 mL; Refill: 0 - guaiFENesin  (MUCINEX ) 600 MG 12 hr tablet; Take 1 tablet (600 mg total) by mouth 2 (two) times daily as needed.  Dispense: 30 tablet; Refill: 0  - Worsening over a  week despite OTC medications - Will treat with Z-pack and Promethazine  DM at bedtime - Albuterol  refilled - Can take Mucinex  during the day - Push fluids.  - Rest.  - Steam and humidifier can help - Seek in person evaluation if worsening or symptoms fail to improve    Follow Up Instructions: I discussed the assessment and treatment plan with the patient. The patient was provided an opportunity to ask questions and all were answered. The patient agreed with the plan and demonstrated an understanding of the instructions.  A copy of instructions were sent to the patient via MyChart unless otherwise noted below.    The patient was advised to call back or seek an in-person evaluation if the symptoms worsen or if the condition fails to improve as anticipated.    Delon CHRISTELLA Dickinson, PA-C

## 2024-02-10 NOTE — Patient Instructions (Signed)
 Jennifer Rich, thank you for joining Delon CHRISTELLA Dickinson, PA-C for today's virtual visit.  While this provider is not your primary care provider (PCP), if your PCP is located in our provider database this encounter information will be shared with them immediately following your visit.   A [place] MyChart account gives you access to today's visit and all your visits, tests, and labs performed at [facility]  click here if you don't have a [facility] MyChart account or go to [email or web address]  Consent: (Patient) Jennifer Rich provided verbal consent for this virtual visit at the beginning of the encounter.  Current Medications:  Current Outpatient Medications:    albuterol  (VENTOLIN  HFA) 108 (90 Base) MCG/ACT inhaler, Inhale 1-2 puffs into the lungs every 6 (six) hours as needed., Disp: 8 g, Rfl: 0   ALPRAZolam  (XANAX ) 0.25 MG tablet, Take 1 tablet (0.25 mg total) by mouth 2 (two) times daily as needed for anxiety., Disp: 10 tablet, Rfl: 0   atorvastatin  (LIPITOR) 20 MG tablet, Take 1 tablet (20 mg total) by mouth daily., Disp: 90 tablet, Rfl: 1   azelastine  (ASTELIN ) 0.1 % nasal spray, Place 2 sprays into both nostrils 2 (two) times daily. Use in each nostril as directed, Disp: 30 mL, Rfl: 1   azithromycin  (ZITHROMAX ) 250 MG tablet, Take 2 tablets on day 1, then 1 tablet daily on days 2 through 5, Disp: 6 tablet, Rfl: 0   benzonatate  (TESSALON ) 100 MG capsule, Take 1 capsule (100 mg total) by mouth 3 (three) times daily as needed for cough., Disp: 30 capsule, Rfl: 0   Continuous Glucose Receiver (FREESTYLE LIBRE 3 READER) DEVI, Check blood sugar, Disp: 1 each, Rfl: 0   Continuous Glucose Sensor (FREESTYLE LIBRE 3 SENSOR) MISC, Place 1 sensor on the skin every 14 days. Use to check glucose continuously, Disp: 2 each, Rfl: 2   cyclobenzaprine  (FLEXERIL ) 10 MG tablet, Take 1 tablet (10 mg total) by mouth 2 (two) times daily as needed for muscle spasms., Disp:  20 tablet, Rfl: 0   diclofenac  Sodium (VOLTAREN ) 1 % GEL, Apply 2 g topically 4 (four) times daily., Disp: 20 g, Rfl: 2   glucose blood (FREESTYLE TEST STRIPS) test strip, Use as instructed, Disp: 100 each, Rfl: 12   guaiFENesin  (MUCINEX ) 600 MG 12 hr tablet, Take 1 tablet (600 mg total) by mouth 2 (two) times daily as needed., Disp: 30 tablet, Rfl: 0   Lancets (FREESTYLE) lancets, Use as instructed, Disp: 100 each, Rfl: 12   losartan -hydrochlorothiazide  (HYZAAR ) 100-25 MG tablet, Take 1 tablet by mouth daily., Disp: 90 tablet, Rfl: 1   olopatadine  (PATANOL) 0.1 % ophthalmic solution, Place 1 drop into both eyes 2 (two) times daily., Disp: 5 mL, Rfl: 12   omeprazole  (PRILOSEC) 20 MG capsule, Take 1 capsule (20 mg total) by mouth daily., Disp: 30 capsule, Rfl: 0   promethazine -dextromethorphan (PROMETHAZINE -DM) 6.25-15 MG/5ML syrup, Take 5 mLs by mouth 4 (four) times daily as needed., Disp: 118 mL, Rfl: 0   spironolactone  (ALDACTONE ) 25 MG tablet, TAKE 1 TABLET BY MOUTH TWICE A DAY, Disp: 180 tablet, Rfl: 0   tirzepatide  (MOUNJARO ) 5 MG/0.5ML Pen, Inject 5 mg into the skin once a week., Disp: 6 mL, Rfl: 1   valACYclovir  (VALTREX ) 500 MG tablet, Take 500 mg by mouth as needed., Disp: , Rfl:    Vitamin D , Ergocalciferol , (DRISDOL ) 1.25 MG (50000 UNIT) CAPS capsule, TAKE 1 CAPSULE BY MOUTH EVERY 7 DAYS, Disp: 4 capsule, Rfl: 0  Medications ordered in this encounter:  Meds ordered this encounter  Medications   azithromycin  (ZITHROMAX ) 250 MG tablet    Sig: Take 2 tablets on day 1, then 1 tablet daily on days 2 through 5    Dispense:  6 tablet    Refill:  0    Supervising Provider:   LAMPTEY, PHILIP O [[id number]]   albuterol  (VENTOLIN  HFA) 108 (90 Base) MCG/ACT inhaler    Sig: Inhale 1-2 puffs into the lungs every 6 (six) hours as needed.    Dispense:  8 g    Refill:  0    Supervising Provider:   BLAISE ALEENE KIDD [[id number]]   promethazine -dextromethorphan (PROMETHAZINE -DM) 6.25-15 MG/5ML syrup     Sig: Take 5 mLs by mouth 4 (four) times daily as needed.    Dispense:  118 mL    Refill:  0    Supervising Provider:   BLAISE ALEENE KIDD [[id number]]   guaiFENesin  (MUCINEX ) 600 MG 12 hr tablet    Sig: Take 1 tablet (600 mg total) by mouth 2 (two) times daily as needed.    Dispense:  30 tablet    Refill:  0    Supervising Provider:   LAMPTEY, PHILIP O [[id number]]     *If you need refills on other medications prior to your next appointment, please contact your pharmacy*  Follow-Up: Call back or seek an in-person evaluation if the symptoms worsen or if the condition fails to improve as anticipated.  [facility] [phone]  Other Instructions Acute Bronchitis, Adult  Acute bronchitis is sudden inflammation of the main airways (bronchi) that come off the windpipe (trachea) in the lungs. The swelling causes the airways to get smaller and make more mucus than normal. This can make it hard to breathe and can cause coughing or noisy breathing (wheezing). Acute bronchitis may last several weeks. The cough may last longer. Allergies, asthma, and exposure to smoke may make the condition worse. What are the causes? This condition can be caused by germs and by substances that irritate the lungs, including: Cold and flu viruses. The most common cause of this condition is the virus that causes the common cold. Bacteria. This is less common. Breathing in substances that irritate the lungs, including: Smoke from cigarettes and other forms of tobacco. Dust and pollen. Fumes from household cleaning products, gases, or burned fuel. Indoor or outdoor air pollution. What increases the risk? The following factors may make you more likely to develop this condition: A weak body's defense system, also called the immune system. A condition that affects your lungs and breathing, such as asthma. What are the signs or symptoms? Common symptoms of this condition include: Coughing. This may bring  up clear, yellow, or green mucus from your lungs (sputum). Wheezing. Runny or stuffy nose. Having too much mucus in your lungs (chest congestion). Shortness of breath. Aches and pains, including sore throat or chest. How is this diagnosed? This condition is usually diagnosed based on: Your symptoms and medical history. A physical exam. You may also have other tests, including tests to rule out other conditions, such as pneumonia. These tests include: A test of lung function. Test of a mucus sample to look for the presence of bacteria. Tests to check the oxygen level in your blood. Blood tests. Chest X-ray. How is this treated? Most cases of acute bronchitis clear up over time without treatment. Your health care provider may recommend: Drinking more fluids to help thin your mucus so it  is easier to cough up. Taking inhaled medicine (inhaler) to improve air flow in and out of your lungs. Using a vaporizer or a humidifier. These are machines that add water to the air to help you breathe better. Taking a medicine that thins mucus and clears congestion (expectorant). Taking a medicine that prevents or stops coughing (cough suppressant). It is not common to take an antibiotic medicine for this condition. Follow these instructions at home:  Take over-the-counter and prescription medicines only as told by your health care provider. Use an inhaler, vaporizer, or humidifier as told by your health care provider. Take two teaspoons (10 mL) of honey at bedtime to lessen coughing at night. Drink enough fluid to keep your urine pale yellow. Do not use any products that contain nicotine or tobacco. These products include cigarettes, chewing tobacco, and vaping devices, such as e-cigarettes. If you need help quitting, ask your health care provider. Get plenty of rest. Return to your normal activities as told by your health care provider. Ask your health care provider what activities are safe for  you. Keep all follow-up visits. This is important. How is this prevented? To lower your risk of getting this condition again: Wash your hands often with soap and water for at least 20 seconds. If soap and water are not available, use hand sanitizer. Avoid contact with people who have cold symptoms. Try not to touch your mouth, nose, or eyes with your hands. Avoid breathing in smoke or chemical fumes. Breathing smoke or chemical fumes will make your condition worse. Get the flu shot every year. Contact a health care provider if: Your symptoms do not improve after 2 weeks. You have trouble coughing up the mucus. Your cough keeps you awake at night. You have a fever. Get help right away if you: Cough up blood. Feel pain in your chest. Have severe shortness of breath. Faint or keep feeling like you are going to faint. Have a severe headache. Have a fever or chills that get worse. These symptoms may represent a serious problem that is an emergency. Do not wait to see if the symptoms will go away. Get medical help right away. Call your local emergency services (911 in the [place] Do not drive yourself to the hospital. Summary Acute bronchitis is inflammation of the main airways (bronchi) that come off the windpipe (trachea) in the lungs. The swelling causes the airways to get smaller and make more mucus than normal. Drinking more fluids can help thin your mucus so it is easier to cough up. Take over-the-counter and prescription medicines only as told by your health care provider. Do not use any products that contain nicotine or tobacco. These products include cigarettes, chewing tobacco, and vaping devices, such as e-cigarettes. If you need help quitting, ask your health care provider. Contact a health care provider if your symptoms do not improve after 2 weeks. This information is not intended to replace advice given to you by your health care provider. Make sure you discuss any questions you  have with your health care provider. Document Revised: 05/30/2021 Document Reviewed: 06/20/2020 Elsevier Patient Education  2024 Elsevier Inc.   If you have been instructed to have an in-person evaluation today at a local Urgent Care facility, please use the link below. It will take you to a list of all of our available [facility], including address, phone number and hours of operation. Please do not delay care.  [facility]  If you or  a family member do not have a primary care provider, use the link below to schedule a visit and establish care. When you choose a [facility] primary care physician or advanced practice provider, you gain a long-term partner in health. Find a Primary Care Provider  Learn more about [facility]'s in-office and virtual care options: [employer] - Get Care Now

## 2024-03-11 ENCOUNTER — Ambulatory Visit: Admitting: Medical

## 2024-03-11 ENCOUNTER — Emergency Department (HOSPITAL_COMMUNITY)

## 2024-03-11 ENCOUNTER — Other Ambulatory Visit: Payer: Self-pay

## 2024-03-11 ENCOUNTER — Encounter (HOSPITAL_COMMUNITY): Payer: Self-pay

## 2024-03-11 ENCOUNTER — Ambulatory Visit: Admitting: Student

## 2024-03-11 ENCOUNTER — Emergency Department (HOSPITAL_COMMUNITY)
Admission: EM | Admit: 2024-03-11 | Discharge: 2024-03-11 | Disposition: A | Discharge status: Home / Self Care | Attending: Emergency Medicine | Admitting: Emergency Medicine

## 2024-03-11 DIAGNOSIS — M25562 Pain in left knee: Secondary | ICD-10-CM | POA: Diagnosis present

## 2024-03-11 DIAGNOSIS — M79641 Pain in right hand: Secondary | ICD-10-CM | POA: Diagnosis not present

## 2024-03-11 DIAGNOSIS — W010XXA Fall on same level from slipping, tripping and stumbling without subsequent striking against object, initial encounter: Secondary | ICD-10-CM | POA: Diagnosis not present

## 2024-03-11 DIAGNOSIS — Y92019 Unspecified place in single-family (private) house as the place of occurrence of the external cause: Secondary | ICD-10-CM | POA: Insufficient documentation

## 2024-03-11 DIAGNOSIS — W19XXXA Unspecified fall, initial encounter: Secondary | ICD-10-CM

## 2024-03-11 MED ORDER — KETOROLAC TROMETHAMINE 15 MG/ML IJ SOLN
15.0000 mg | Freq: Once | INTRAMUSCULAR | Status: AC
  Administered 2024-03-11: 15 mg via INTRAMUSCULAR
  Filled 2024-03-11: qty 1

## 2024-03-11 MED ORDER — NAPROXEN 500 MG PO TABS: 500.0000 mg | ORAL_TABLET | Freq: Two times a day (BID) | ORAL | 0 refills | Status: DC

## 2024-03-11 NOTE — Progress Notes (Signed)
 Orthopedic Tech Progress Note Patient Details:  Jennifer Rich [birth year] [id number]  Ortho Devices Type of Ortho Device: Knee Immobilizer Ortho Device/Splint Location: LLE Ortho Device/Splint Interventions: Ordered, Application, Adjustment   Post Interventions Patient Tolerated: Well Instructions Provided: Care of device, Adjustment of device  Adine MARLA Blush 03/11/2024, 8:38 PM

## 2024-03-11 NOTE — ED Provider Notes (Addendum)
 "  EMERGENCY DEPARTMENT AT [facility] Provider Note   CSN: [id number] Arrival date & time: 03/11/24  1419     Patient presents with: Jennifer Rich is a 59 y.o. female.  Presents to the ED for left knee pain and right hand pain after a fall that occurred this morning.  Patient states that she fell on water at home.  Did not hit her head.  Denies LOC.  She has been having pain in her left knee and right hand since the fall.  Denies any numbness or tingling.  Denies any weakness.  Patient does note difficulty ambulating due to knee pain.  Denies fevers, chills, cough, shortness of breath, ankle pain, neck pain, wrist pain, shoulder pain.     Fall       Prior to Admission medications  Medication Sig Start Date End Date Taking? Authorizing Provider  naproxen  (NAPROSYN ) 500 MG tablet Take 1 tablet (500 mg total) by mouth 2 (two) times daily for 15 days. 03/11/24 03/26/24 Yes Lovette Merta, PA-C  albuterol  (VENTOLIN  HFA) 108 (90 Base) MCG/ACT inhaler Inhale 1-2 puffs into the lungs every 6 (six) hours as needed. 02/10/24   Vivienne Delon HERO, PA-C  ALPRAZolam  (XANAX ) 0.25 MG tablet Take 1 tablet (0.25 mg total) by mouth 2 (two) times daily as needed for anxiety. 05/01/23   Almarie Waddell NOVAK, NP  atorvastatin  (LIPITOR) 20 MG tablet Take 1 tablet (20 mg total) by mouth daily. 12/15/23   Almarie Waddell NOVAK, NP  azelastine  (ASTELIN ) 0.1 % nasal spray Place 2 sprays into both nostrils 2 (two) times daily. Use in each nostril as directed 06/05/23   Saguier, [place], PA-C  benzonatate  (TESSALON ) 100 MG capsule Take 1 capsule (100 mg total) by mouth 3 (three) times daily as needed for cough. 06/05/23   Saguier, [place], PA-C  Continuous Glucose Receiver (FREESTYLE LIBRE 3 READER) DEVI Check blood sugar 12/31/22   Domenica Harlene DELENA, MD  Continuous Glucose Sensor (FREESTYLE LIBRE 3 SENSOR) MISC Place 1 sensor on the skin every 14 days. Use to check glucose continuously 12/16/22   Domenica Harlene DELENA, MD  cyclobenzaprine  (FLEXERIL ) 10 MG tablet Take 1 tablet (10 mg total) by mouth 2 (two) times daily as needed for muscle spasms. 06/11/23   Yolande Lamar BROCKS, MD  glucose blood (FREESTYLE TEST STRIPS) test strip Use as instructed 12/16/22   Domenica Harlene DELENA, MD  guaiFENesin  (MUCINEX ) 600 MG 12 hr tablet Take 1 tablet (600 mg total) by mouth 2 (two) times daily as needed. 02/10/24   Vivienne Delon HERO, PA-C  Lancets (FREESTYLE) lancets Use as instructed 12/16/22   Domenica Harlene DELENA, MD  losartan -hydrochlorothiazide  (HYZAAR ) 100-25 MG tablet Take 1 tablet by mouth daily. 05/01/23   Almarie Waddell NOVAK, NP  olopatadine  (PATANOL) 0.1 % ophthalmic solution Place 1 drop into both eyes 2 (two) times daily. 06/09/23   Saguier, Dallas, PA-C  omeprazole  (PRILOSEC) 20 MG capsule Take 1 capsule (20 mg total) by mouth daily. 12/16/22   Rancour, Garnette, MD  promethazine -dextromethorphan (PROMETHAZINE -DM) 6.25-15 MG/5ML syrup Take 5 mLs by mouth 4 (four) times daily as needed. 02/10/24   Vivienne Delon HERO, PA-C  spironolactone  (ALDACTONE ) 25 MG tablet TAKE 1 TABLET BY MOUTH TWICE A DAY 12/21/23   Domenica Harlene DELENA, MD  tirzepatide  (MOUNJARO ) 5 MG/0.5ML Pen Inject 5 mg into the skin once a week. 02/01/24   Webb, Padonda B, FNP  valACYclovir  (VALTREX ) 500 MG tablet Take 500 mg by  mouth as needed. 05/20/21   [provider]  Vitamin D , Ergocalciferol , (DRISDOL ) 1.25 MG (50000 UNIT) CAPS capsule TAKE 1 CAPSULE BY MOUTH EVERY 7 DAYS 10/28/23   Domenica Harlene LABOR, MD    Allergies: Metoprolol  and Lisinopril    Review of Systems  Updated Vital Signs BP (!) 147/88 (BP Location: Left Arm)   Pulse 82   Temp 98.1 F (36.7 C)   Resp 18   Wt (!) 140 kg   LMP 09/14/2017   SpO2 97%   BMI 48.34 kg/m   Physical Exam Vitals and nursing note reviewed.  Constitutional:      General: She is not in acute distress.    Appearance: She is well-developed. She is not ill-appearing.  HENT:     Head: Normocephalic  and atraumatic.  Eyes:     Conjunctiva/sclera: Conjunctivae normal.  Cardiovascular:     Rate and Rhythm: Normal rate and regular rhythm.     Heart sounds: No murmur heard. Pulmonary:     Effort: Pulmonary effort is normal. No respiratory distress.     Breath sounds: Normal breath sounds.  Abdominal:     Palpations: Abdomen is soft.     Tenderness: There is no abdominal tenderness.  Musculoskeletal:        General: No swelling.     Right elbow: Normal.     Left elbow: Normal.     Right forearm: Normal.     Left forearm: Normal.     Right wrist: Normal.     Left wrist: Normal.     Right hand: Normal.     Left hand: Normal.     Cervical back: Neck supple.     Right upper leg: Normal.     Left upper leg: Normal.     Right knee: Normal.     Left knee: No swelling, deformity, effusion, erythema, ecchymosis or lacerations. Decreased range of motion. Tenderness present over the PCL.     Right lower leg: Normal.     Left lower leg: Normal.     Right ankle: Normal.     Left ankle: Normal.     Right foot: Normal.     Left foot: Normal.     Comments: Tenderness to palpation to left popliteal fossa.  Able to ambulate without difficulty.  Range of motion limited due to pain.  No swelling, redness, warmth appreciated to the left knee. No tenderness to palpation of the right hand or wrist.  2+ radial pulse bilaterally.  No swelling, erythema, laceration noted to the right hand or wrist.  Normal range of motion and 5 out of 5 muscle strength of hand, wrist.  Normal range of motion of the bilateral forearm, elbow, shoulder.  Normal range of motion of left ankle and foot. Sensation intact.  Skin:    General: Skin is warm and dry.     Capillary Refill: Capillary refill takes less than 2 seconds.  Neurological:     Mental Status: She is alert.     Sensory: Sensation is intact.     Gait: Gait is intact.  Psychiatric:        Mood and Affect: Mood normal.     (all labs ordered are listed,  but only abnormal results are displayed) Labs Reviewed - No data to display  EKG: None  Radiology: DG Knee Complete 4 Views Left Result Date: 03/11/2024 CLINICAL DATA:  Left knee pain after fall EXAM: LEFT KNEE - COMPLETE 4+ VIEW COMPARISON:  None Available.  FINDINGS: No evidence of fracture, dislocation, or joint effusion. Minimal narrowing of medial joint space is noted with osteophyte formation. Minimal patellar spurring is noted as well. Soft tissues are unremarkable. IMPRESSION: Minimal degenerative joint disease. No acute abnormality seen. Electronically Signed   By: Lynwood Landy Raddle M.D.   On: 03/11/2024 16:11   DG Hand Complete Right Result Date: 03/11/2024 CLINICAL DATA:  Right hand pain after fall EXAM: RIGHT HAND - COMPLETE 3+ VIEW COMPARISON:  None Available. FINDINGS: There is no evidence of fracture or dislocation. There is no evidence of arthropathy or other focal bone abnormality. Soft tissues are unremarkable. IMPRESSION: Negative. Electronically Signed   By: Lynwood Landy Raddle M.D.   On: 03/11/2024 16:09     Procedures   Medications Ordered in the ED  ketorolac  (TORADOL ) 15 MG/ML injection 15 mg (15 mg Intramuscular Given 03/11/24 2007)                                    Medical Decision Making Risk Prescription drug management.   Patient presents with left knee and right hand pain after a fall.  No LOC.  No head injury.  No numbness or tingling.  X-rays were negative with no signs of dislocations or fractures.  Patient is not ill-appearing.  On exam there is tenderness to left popliteal fossa.  Patient is able to ambulate without difficulty.  No swelling, erythema, or deformity appreciated.  Patient is able to flex knee however it is painful.  Right hand and wrist is normal without any deformity, swelling, erythema.  2+ radial pulse bilaterally.  2+ DP and PT pulse bilaterally.  Normal range of motion.  Sensation intact.  Vital signs stable.  Discussed the results with the  patient.  Will send naproxen  for pain.  Advised patient to apply ice as well as knee sleeve.  Recommended patient to follow-up with orthopedic if worsening.  Provided Toradol  here as well as a knee brace.  Return precautions provided.  Patient is in agreement with plan and is stable for discharge.        Final diagnoses:  Acute pain of left knee  Fall, initial encounter    ED Discharge Orders          Ordered    naproxen  (NAPROSYN ) 500 MG tablet  2 times daily        03/11/24 1953               Braxton Dubois, PA-C 03/11/24 2003    [address] PA-C 03/11/24 2008    Nataniel Gasper, PA-C 03/11/24 2009    Cleotilde Rogue, MD 03/11/24 2242  "

## 2024-03-11 NOTE — Discharge Instructions (Addendum)
 Your x-rays were normal here today.  Take naproxen  for pain as needed apply ice for 15 minutes normal times a day.  Grab knee sleeve over-the-counter to stabilize the knee in place.  Elevate the leg.  Follow-up with orthopedic if symptoms are worsening.  Return for worsening swelling, redness, pain, fevers, numbness or tingling, weakness.

## 2024-03-11 NOTE — ED Triage Notes (Signed)
 POV/ brought in by husband/ slipped and fell on water at home today / injury to right hand and left knee/ [id number]

## 2024-03-16 ENCOUNTER — Other Ambulatory Visit: Payer: Self-pay | Admitting: Family Medicine

## 2024-03-16 DIAGNOSIS — I10 Essential (primary) hypertension: Secondary | ICD-10-CM

## 2024-03-21 ENCOUNTER — Ambulatory Visit: Admitting: Family Medicine

## 2024-03-21 ENCOUNTER — Encounter

## 2024-03-21 ENCOUNTER — Ambulatory Visit (INDEPENDENT_AMBULATORY_CARE_PROVIDER_SITE_OTHER)

## 2024-03-21 ENCOUNTER — Ambulatory Visit

## 2024-03-21 VITALS — BP 146/90 | Ht 67.0 in | Wt 308.0 lb

## 2024-03-21 DIAGNOSIS — M2392 Unspecified internal derangement of left knee: Secondary | ICD-10-CM | POA: Diagnosis not present

## 2024-03-21 DIAGNOSIS — M25562 Pain in left knee: Secondary | ICD-10-CM | POA: Diagnosis not present

## 2024-03-21 DIAGNOSIS — M1712 Unilateral primary osteoarthritis, left knee: Secondary | ICD-10-CM | POA: Diagnosis not present

## 2024-03-21 MED ORDER — NAPROXEN 500 MG PO TABS: 500.0000 mg | ORAL_TABLET | Freq: Two times a day (BID) | ORAL | 0 refills | Status: AC

## 2024-03-21 NOTE — Progress Notes (Signed)
 "  Subjective:    Patient ID: Jennifer Rich, female    DOB: 59 y.o., [birth year]   MRN: [id number]  Chief Complaint: Left knee pain  Discussed the use of AI scribe software for clinical note transcription with the patient, who gave verbal consent to proceed.  History of Present Illness Jennifer Rich is a 59 year old female who presents with acute left knee pain and dysfunction following a fall.  Left Knee Pain and Dysfunction: - Acute onset following slip on liquid on hardwood dining room floor - Pulling sensation in posterior left knee at time of injury - Pain progressively worsened over first day, limiting ambulation - Significant posterior and lateral left knee pain persists - Intermittent locking and stiffness, especially after prolonged sitting and in the morning, requiring time to loosen up - Weightbearing and motion are possible but painful - No prior left knee injuries, surgeries, or chronic knee problems - No subjective knee instability or bleeding episodes  Functional Impact and Prior Treatment: - Ambulation limited initially, prompting emergency department visit - Treated with 10-day course of Naprosyn  and knee sleeve - Naprosyn  taken inconsistently due to dislike of oral medications - Knee sleeve worn for several days - Currently able to ambulate, but with significant pain  Associated Injuries: - Left hand pain at time of fall - No loss of consciousness or head trauma  Physical Activity Baseline: - Usually physically active with regular dancing  Review of Pertinent Imaging: 4 view plain film radiographs obtained of the left knee on 03/11/2024 per my independent review moderate joint space narrowing present in the medial compartment with mild osteophytic change noted along the medial aspect of the medial compartment.  Mild squaring noted along the lateral compartment with largely well-preserved joint space. Osteophytic change noted at the superior and inferior  patellar poles with enthesophyte present within the distal quad insertion.  Radiographic effusion.  Trace chondrocalcinosis present    Objective:   Vitals:   03/21/24 1144  BP: (!) 146/90    Left Knee (compared to normal) -Inspection: + swelling. + effusion. Mild increased warmth. -Palpation: TTP - quad tendon, - patella, - patellar tendon, - tibial tuberosity, + mild TTP at pes bursa, - gerdy tubercle, + medial joint line (posterior aspect), + lateral joint line (posterior aspect), - posterior knee, + medial hamstring, - lateral hamstrings. No significant crepitus with flexion/extension. -AROM/PROM: 0 degrees extension, 110 degrees flexion, normal hamstring flexibility -Strength: unable to tolerate single leg squat, 5/5 flexion, 5/5 extension -Special tests:    -ACL: - lachman, - pivot shift   -MCL: stable and painless with valgus at 0/30 degrees   -LCL: stable and painless with varus at 0/30 degrees   -PCL: - sag sign   -Meniscus: + thessaly, + McMurray   -Patellofemoral: - patellar grind   - Worst pain when knee is fully extended and provider twists the through passive internal/external rotation with mild axial pressure applied     Assessment & Plan:   Assessment & Plan Left knee pain with suspected meniscus tear She sustained an acute left knee injury following a fall, resulting in persistent pain, swelling, warmth, and mechanical symptoms such as locking. Exam findings and the mechanism of injury suggest a meniscus tear, though ligamentous structures remain intact. An MRI is necessary to confirm the diagnosis and assess intra-articular pathology. Non-operative management with physical therapy and NSAIDs is appropriate while awaiting imaging. Naprosyn  is prescribed for pain and inflammation due to its non-addictive nature and longer  duration of action compared to ibuprofen . Improvement is expected with therapy and medication, with further management based on MRI results. A left knee  MRI has been ordered to evaluate for a meniscus tear and intra-articular pathology. She is referred to physical therapy for knee rehabilitation and strengthening, focusing on range of motion within tolerance and avoiding excessive stress. Instructions on Naprosyn  dosing and duration have been provided, along with education on its benefits. She will receive FMLA paperwork for intermittent leave to accommodate therapy appointments. A follow-up is planned after MRI results are available.   "

## 2024-03-21 NOTE — Progress Notes (Unsigned)
 Erroneous encounter due to cancellation or no-show. Patient was not seen.

## 2024-03-23 NOTE — Therapy (Signed)
 " OUTPATIENT PHYSICAL THERAPY LOWER EXTREMITY EVALUATION   Patient Name: Jennifer Rich MRN: [id number] DOB:10-Jul-1965, 59 y.o., female Today's Date: 03/29/2024   END OF SESSION:  PT End of Session - 03/29/24 1536     Visit Number 1    Date for Recertification  05/24/24    Authorization Type [employer] - [id number] OOP applies    PT Start Time 1536    PT Stop Time 1639    PT Time Calculation (min) 63 min    Activity Tolerance Patient tolerated treatment well    Behavior During Therapy 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   BLASTOCYSTIS HOMINIS     CAP (community acquired pneumonia) 04/17/2015   Cervical cancer screening 01/25/2013   Chicken pox as a child   Fatty liver    Goiter    Mildly enlarged thyroid  TSH normal    Hepatitis C    was treated in 2022   Hyperglycemia    Hyperlipidemia    Hypertension    Obesity    Obesity, unspecified 04/26/2013   Pneumonia 05/02/2013   Preventative health care 12/18/2012   Has seen Dr Levora for GYN in past   Sleep apnea    Type II or unspecified type diabetes mellitus without mention of complication, not stated as uncontrolled    Vitamin D  deficiency    Past Surgical History:  Procedure Laterality Date   WISDOM TOOTH EXTRACTION  59 yrs old   Patient Active Problem List   Diagnosis Date Noted   Lesion of adrenal gland 07/28/2022   Primary osteoarthritis of right shoulder 07/07/2022   Hepatitis C antibody test positive 11/20/2021   Obstructive sleep apnea syndrome 11/20/2021   Other fatigue 11/18/2021   SOB (shortness of breath) on exertion 11/18/2021   Health care maintenance 11/18/2021   Type 2 diabetes mellitus in patient with obesity (HCC) 11/18/2021   Vitamin B 12 deficiency 11/18/2021   Class 3 severe obesity with serious comorbidity and body mass index (BMI) of 50.0 to 59.9 in adult  (HCC) 11/18/2021   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 07/23/2021   Syphilis 06/19/2021   Parotid mass 05/24/2021   Parotitis 05/15/2021   Tinnitus of right ear 05/15/2021   Fatty liver 04/15/2021   Hx of hepatitis C 04/15/2021   Essential hypertension, benign 11/02/2013   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   Anxiety and depression 01/30/2013   Cervical cancer screening 01/25/2013   Preventative health care 12/18/2012   Hyperlipidemia    Vitamin D  deficiency    Goiter     PCP: Domenica Harlene DELENA, MD   REFERRING PROVIDER: Charles Redell DELENA, DO   REFERRING DIAG:  [phone] (ICD-10-CM) - Acute pain of left knee  M17.12 (ICD-10-CM) - Primary osteoarthritis of left knee  M23.92 (ICD-10-CM) - Internal derangement of knee, acute, left  M25.562 (ICD-10-CM) - Mechanical knee pain, left   THERAPY DIAG:  Acute pain of left knee  Stiffness of left knee, not elsewhere classified  Muscle weakness (generalized)  Other abnormalities of gait and mobility  RATIONALE FOR EVALUATION AND TREATMENT: Rehabilitation  ONSET DATE: 03/11/2024  NEXT MD VISIT: TBD pending MRI results   SUBJECTIVE:  SUBJECTIVE STATEMENT: Pt reports on 03/11/24 she fell in her dining room after slipping on something wet on floor, buckling with her knee under her.  Went to ED later that day but still having a lot of pain making walking difficult. Pain has been subsiding but still has issues when she first gets up after sitting for a while or when climbing stairs.  Feels like she has trouble bending her knee sometimes, with the knee catching or locking.  PAIN: Are you having pain? Yes: NPRS scale: 2-3/10  Pain location: L knee - posterior  Pain description: throbbing, catching  Aggravating factors: getting up after  sitting for a while, when climbing stairs Relieving factors: Naproxen , ice for 1st week, trying to move more frequently    PERTINENT HISTORY:  OA, DM-II, HTN, obesity, anxiety, depression, OSA, Hep C positive  PRECAUTIONS: None  RED FLAGS: None  WEIGHT BEARING RESTRICTIONS: No  FALLS:  Has patient fallen in last 6 months? Yes. 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   OBJECTIVE: (objective measures completed at initial evaluation unless otherwise dated)  DIAGNOSTIC FINDINGS:  03/29/24 - MRI L knee: Result not yet available  03/11/24 - LEFT KNEE - COMPLETE 4+ VIEW: 4 view plain film radiographs obtained of the left knee on 03/11/2024 per my independent review (Dr. Charles) moderate joint space narrowing present in the medial compartment with mild osteophytic change noted along the medial aspect of the medial compartment.  Mild squaring noted along the lateral compartment with largely well-preserved joint space. Osteophytic change noted at the superior and inferior patellar poles with enthesophyte present within the distal quad insertion.  Radiographic effusion.  Trace chondrocalcinosis present.  CLINICAL DATA:  Left knee pain after fall  FINDINGS: No evidence of fracture, dislocation, or joint effusion. Minimal narrowing of medial joint space is noted with osteophyte formation. Minimal patellar spurring is noted as well. Soft tissues are unremarkable.   IMPRESSION: Minimal degenerative joint disease. No acute abnormality seen.  03/11/24 - RIGHT KNEE - COMPLETE 4+  VIEW CLINICAL DATA:  Right knee pain.   FINDINGS: No acute fracture or dislocation. The bones are well mineralized. Moderate tricompartmental narrowing and spurring. No joint effusion. The soft tissues are unremarkable.   IMPRESSION: 1. No acute fracture or dislocation. 2. Moderate tricompartmental osteoarthritis.  PATIENT SURVEYS:  LEFS  Extreme difficulty/unable (0), Quite a bit of difficulty (1), Moderate difficulty (2), Little difficulty (3), No difficulty (4) Survey date:  03/29/2024   Any of your usual work, housework or school activities 1  2. Usual hobbies, recreational or sporting activities 2  3. Getting into/out of the bath 2  4. Walking between rooms 2  5. Putting on socks/shoes 1  6. Squatting  2  7. Lifting an object, like a bag of groceries from the floor 2  8. Performing light activities around your home 3  9. Performing heavy activities around your home 1  10. Getting into/out of a car 2  11. Walking 2 blocks 1  12. Walking 1 mile 0  13. Going up/down 10  stairs (1 flight) 0  14. Standing for 1 hour 1  15.  sitting for 1 hour 0  16. Running on even ground 1  17. Running on uneven ground 0  18. Making sharp turns while running fast 0  19. Hopping  0  20. Rolling over in bed 2  Score total:  25 / 80 = 31.3 %     COGNITION: Overall cognitive status: Within functional limits for tasks assessed    SENSATION: WFL  EDEMA:  Circumferential: Mid patella R = 49.0 cm L = 50.5 cm  POSTURE:  Very slight genu recurvatum in standing  PALPATION: Decreased L>R patellar mobility with increased quad tightness  MUSCLE LENGTH: Hamstrings: very mild tight B ITB: mod tight B Piriformis: mild tight B Hip flexors: mild/mod tight B Quads: mod tight B Heelcord: WFL  LOWER EXTREMITY ROM:  Active ROM Right eval Left eval  Knee flexion 116 108  Knee extension 0 0  (Blank rows = not tested)  LOWER EXTREMITY MMT:  MMT Right eval Left eval  Hip flexion 4 4-   Hip extension 4 4  Hip abduction 4 4  Hip adduction 4+ 4+  Hip internal rotation 4+ 4+  Hip external rotation 5 5  Knee flexion 5 4 p!  Knee extension 4+ 4- p!  Ankle dorsiflexion 5 5  Ankle plantarflexion 4 3+ *  Ankle inversion    Ankle eversion     (Blank rows = not tested)  LOWER EXTREMITY SPECIAL TESTS:  Knee special tests: Anterior drawer test: negative, Lachman Test: negative, Patellafemoral apprehension test: negative, and Lateral pull sign: positive   FUNCTIONAL TESTS: TBA next visit 5 times sit to stand:   10 meter walk test:    GAIT: Distance walked: clinic distances Assistive device utilized: None Level of assistance: Complete Independence Gait pattern: decreased stance time- Left and antalgic Comments: decreased gait speed   TODAY'S TREATMENT:    03/29/2024 - Eval SELF CARE:  Reviewed eval findings and role of PT in addressing identified deficits as well as instruction in initial HEP (see below).    PATIENT EDUCATION:  Education details: PT eval findings, anticipated POC, initial HEP, and MedBridgeGO app setup and use for HEP  Person educated: Patient Education method: Explanation, Demonstration, Verbal cues, and MedBridge[employer] app access provided Education comprehension: verbalized understanding, returned demonstration, verbal cues required, and needs further education  HOME EXERCISE PROGRAM: *Pt using MedBridge[employer] app.  Access Code: [email or web address] URL: [email or web address] Date: 03/29/2024 Prepared by: Jennifer Rich  Exercises - Hip Flexor Stretch with Strap on Table  - 2 x daily - 7 x weekly - 3 reps - 30 sec hold - Prone Quad Stretch with Towel Roll and Strap  - 2 x daily - 7 x weekly - 3 reps - 30 sec hold - Supine Quad Set  - 2 x daily - 7 x weekly - 2 sets - 10 reps - 3 sec hold - Straight Leg Raise with External Rotation (Mirrored)  - 2 x daily - 7 x weekly - 1-2 sets - 10 reps - 3 sec hold - Bridge with Resistance  - 1 x daily - 7 x  weekly - 2 sets - 10 reps - 5 sec hold - Supine Bridge with Mini Swiss Ball Between Knees  - 1 x daily - 7 x weekly - 2 sets - 10 reps - 5 sec hold   ASSESSMENT:  CLINICAL IMPRESSION: Jennifer Rich is a 59 y.o. female who was referred to  physical therapy for evaluation and treatment for acute L knee pain.  MRI performed today with results not yet available.  Patient reports onset of L knee pain beginning on 03/11/24 after a slip and fall on a wet floor in her dining room.  Pain was initially very severe creating significant difficulty with walking but has been gradually improving since she started the Naproxen .  Pain is worse with initiation of movement after prolonged sitting and with climbing stairs.  Patient has deficits in L knee ROM, proximal LE flexibility, B LE strength, abnormal posture with slight B genu recurvatum, and TTP with abnormal muscle tension and decreased patellar mobility which are interfering with ADLs and are impacting quality of life.  On LEFS patient scored 25/80 demonstrating moderate functional limitation.  Jennifer Rich will benefit from skilled PT to address above deficits to improve mobility and activity tolerance with decreased pain interference.  OBJECTIVE IMPAIRMENTS: Abnormal gait, decreased activity tolerance, decreased balance, decreased knowledge of condition, decreased mobility, difficulty walking, decreased ROM, decreased strength, increased fascial restrictions, impaired perceived functional ability, increased muscle spasms, impaired flexibility, improper body mechanics, postural dysfunction, and pain.   ACTIVITY LIMITATIONS: carrying, lifting, bending, sitting, standing, squatting, stairs, transfers, bed mobility, and locomotion level  PARTICIPATION LIMITATIONS: meal prep, cleaning, laundry, shopping, community activity, and occupation  PERSONAL FACTORS: Fitness, Past/current experiences, Profession, Time since onset of injury/illness/exacerbation, and 3+  comorbidities: OA, DM-II, HTN, obesity, anxiety, depression, OSA, Hep C positive are also affecting patient's functional outcome.   REHAB POTENTIAL: Good  CLINICAL DECISION MAKING: Evolving/moderate complexity  EVALUATION COMPLEXITY: Moderate   GOALS: Goals reviewed with patient? Yes  SHORT TERM GOALS: Target date: 04/26/2024  Patient will be independent with initial HEP. Baseline: Initial HEP issued on eval Goal status: INITIAL  2.  Patient will report at least 25% improvement in L knee pain to improve QOL. Baseline: 2-3/10 Goal status: INITIAL  LONG TERM GOALS: Target date: 05/24/2024  Patient will be independent with advanced/ongoing HEP to improve outcomes and carryover.  Baseline:  Goal status: INITIAL  2.  Patient will report at least 50-75% improvement in L knee pain to improve QOL. Baseline: 2-3/10 Goal status: INITIAL  3.  Patient will demonstrate improved L  knee AROM to >/= 0-115 deg to allow for normal gait and stair mechanics. Baseline: Refer to above LE ROM table Goal status: INITIAL  4.  Patient will demonstrate improved B LE strength to >/= 4+/5 for improved stability and ease of mobility. Baseline: Refer to above LE MMT table Goal status: INITIAL  5.  Patient will be able to ascend/descend stairs with 1 HR and reciprocal step pattern safely to access home and community.  Baseline:  Goal status: INITIAL  6.  Patient will report >/= 40/80 on LEFS (MCID = 9 pts) to demonstrate improved functional ability. Baseline: 25 / 80 = 31.3 % Goal status: INITIAL  7.  Patient will demonstrate at least 25/30 on FGA to decrease risk of falls. Baseline: TBA Goal status: INITIAL    PLAN:  PT FREQUENCY: 1-2x/week  PT DURATION: 8 weeks  PLANNED INTERVENTIONS: 97164- PT Re-evaluation, 97750- Physical Performance Testing, 97110-Therapeutic exercises, 97530- Therapeutic activity, [id number]- Neuromuscular re-education, 97535- Self Care, [id number]- Manual therapy, [id number]- Gait  training, [phone]- Aquatic Therapy, [phone]- Electrical stimulation (unattended), 97035- Ultrasound, [id number]- Ionotophoresis 4mg /ml Dexamethasone , Patient/Family education, Stair training, Taping, Joint mobilization, Cryotherapy, and Moist heat  PLAN FOR NEXT SESSION: Review initial HEP, gentle L knee ROM, progress quad and hip strengthening, potential Ktape for knee  support   Jennifer CHRISTELLA Rich, PT 03/29/2024, 4:55 PM  "

## 2024-03-24 ENCOUNTER — Ambulatory Visit

## 2024-03-29 ENCOUNTER — Encounter: Payer: Self-pay | Admitting: Physical Therapy

## 2024-03-29 ENCOUNTER — Ambulatory Visit: Admitting: Physical Therapy

## 2024-03-29 ENCOUNTER — Other Ambulatory Visit: Payer: Self-pay

## 2024-03-29 ENCOUNTER — Ambulatory Visit (HOSPITAL_BASED_OUTPATIENT_CLINIC_OR_DEPARTMENT_OTHER)
Admission: RE | Admit: 2024-03-29 | Discharge: 2024-03-29 | Disposition: A | Discharge status: Home / Self Care | Source: Ambulatory Visit

## 2024-03-29 DIAGNOSIS — R2689 Other abnormalities of gait and mobility: Secondary | ICD-10-CM | POA: Insufficient documentation

## 2024-03-29 DIAGNOSIS — M25662 Stiffness of left knee, not elsewhere classified: Secondary | ICD-10-CM | POA: Insufficient documentation

## 2024-03-29 DIAGNOSIS — M1712 Unilateral primary osteoarthritis, left knee: Secondary | ICD-10-CM | POA: Insufficient documentation

## 2024-03-29 DIAGNOSIS — M2392 Unspecified internal derangement of left knee: Secondary | ICD-10-CM | POA: Insufficient documentation

## 2024-03-29 DIAGNOSIS — M25562 Pain in left knee: Secondary | ICD-10-CM | POA: Insufficient documentation

## 2024-03-29 DIAGNOSIS — M6281 Muscle weakness (generalized): Secondary | ICD-10-CM | POA: Insufficient documentation

## 2024-03-31 ENCOUNTER — Encounter: Payer: Self-pay | Admitting: Physical Therapy

## 2024-03-31 ENCOUNTER — Ambulatory Visit: Admitting: Physical Therapy

## 2024-03-31 DIAGNOSIS — M25662 Stiffness of left knee, not elsewhere classified: Secondary | ICD-10-CM

## 2024-03-31 DIAGNOSIS — R2689 Other abnormalities of gait and mobility: Secondary | ICD-10-CM

## 2024-03-31 DIAGNOSIS — M25562 Pain in left knee: Secondary | ICD-10-CM | POA: Diagnosis not present

## 2024-03-31 DIAGNOSIS — M6281 Muscle weakness (generalized): Secondary | ICD-10-CM

## 2024-03-31 NOTE — Therapy (Signed)
 " OUTPATIENT PHYSICAL THERAPY TREATMENT   Patient Name: NAJIYAH PARIS MRN: [id number] DOB:1965-12-10, 59 y.o., female Today's Date: 03/31/2024   END OF SESSION:  PT End of Session - 03/31/24 1618     Visit Number 2    Date for Recertification  05/24/24    Authorization Type [employer] - $[id number] OOP applies    PT Start Time 1618    PT Stop Time 1705    PT Time Calculation (min) 47 min    Activity Tolerance Patient tolerated treatment well    Behavior During Therapy [facility] for tasks assessed/performed           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   BLASTOCYSTIS HOMINIS     CAP (community acquired pneumonia) 04/17/2015   Cervical cancer screening 01/25/2013   Chicken pox as a child   Fatty liver    Goiter    Mildly enlarged thyroid  TSH normal    Hepatitis C    was treated in 2022   Hyperglycemia    Hyperlipidemia    Hypertension    Obesity    Obesity, unspecified 04/26/2013   Pneumonia 05/02/2013   Preventative health care 12/18/2012   Has seen Dr Levora for GYN in past   Sleep apnea    Type II or unspecified type diabetes mellitus without mention of complication, not stated as uncontrolled    Vitamin D  deficiency    Past Surgical History:  Procedure Laterality Date   WISDOM TOOTH EXTRACTION  59 yrs old   Patient Active Problem List   Diagnosis Date Noted   Lesion of adrenal gland 07/28/2022   Primary osteoarthritis of right shoulder 07/07/2022   Hepatitis C antibody test positive 11/20/2021   Obstructive sleep apnea syndrome 11/20/2021   Other fatigue 11/18/2021   SOB (shortness of breath) on exertion 11/18/2021   Health care maintenance 11/18/2021   Type 2 diabetes mellitus in patient with obesity (HCC) 11/18/2021   Vitamin B 12 deficiency 11/18/2021   Class 3 severe obesity with serious comorbidity and body mass index (BMI) of 50.0 to 59.9 in adult (HCC) 11/18/2021    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 07/23/2021   Syphilis 06/19/2021   Parotid mass 05/24/2021   Parotitis 05/15/2021   Tinnitus of right ear 05/15/2021   Fatty liver 04/15/2021   Hx of hepatitis C 04/15/2021   Essential hypertension, benign 11/02/2013   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   Anxiety and depression 01/30/2013   Cervical cancer screening 01/25/2013   Preventative health care 12/18/2012   Hyperlipidemia    Vitamin D  deficiency    Goiter     PCP: Domenica Harlene DELENA, MD   REFERRING PROVIDER: Charles Redell DELENA, DO   REFERRING DIAG:  [phone] (ICD-10-CM) - Acute pain of left knee  M17.12 (ICD-10-CM) - Primary osteoarthritis of left knee  M23.92 (ICD-10-CM) - Internal derangement of knee, acute, left  M25.562 (ICD-10-CM) - Mechanical knee pain, left   THERAPY DIAG:  Acute pain of left knee  Stiffness of left knee, not elsewhere classified  Muscle weakness (generalized)  Other abnormalities of gait and mobility  RATIONALE FOR EVALUATION AND TREATMENT: Rehabilitation  ONSET DATE: 03/11/2024  NEXT MD VISIT: TBD pending MRI results   SUBJECTIVE:  SUBJECTIVE STATEMENT: Pt reports her R knee started bothering her around the kneecap after the initial PT eval last visit.  Currently uncomfortable but no pain.  EVAL: Pt reports on 03/11/24 she fell in her dining room after slipping on something wet on floor, buckling with her knee under her.  Went to ED later that day but still having a lot of pain making walking difficult. Pain has been subsiding but still has issues when she first gets up after sitting for a while or when climbing stairs.  Feels like she has trouble bending her knee sometimes, with the knee catching or locking.  PAIN: Are you having pain? No and Yes:  NPRS scale: 0/10  Pain location: L knee - posterior   PERTINENT HISTORY:  OA, DM-II, HTN, obesity, anxiety, depression, OSA, Hep C positive  PRECAUTIONS: None  RED FLAGS: None  WEIGHT BEARING RESTRICTIONS: No  FALLS:  Has patient fallen in last 6 months? Yes. 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   OBJECTIVE: (objective measures completed at initial evaluation unless otherwise dated)  DIAGNOSTIC FINDINGS:  03/29/24 - MRI L knee: Result not yet available  03/11/24 - LEFT KNEE - COMPLETE 4+ VIEW: 4 view plain film radiographs obtained of the left knee on 03/11/2024 per my independent review (Dr. Charles) moderate joint space narrowing present in the medial compartment with mild osteophytic change noted along the medial aspect of the medial compartment.  Mild squaring noted along the lateral compartment with largely well-preserved joint space. Osteophytic change noted at the superior and inferior patellar poles with enthesophyte present within the distal quad insertion.  Radiographic effusion.  Trace chondrocalcinosis present.  CLINICAL DATA:  Left knee pain after fall  FINDINGS: No evidence of fracture, dislocation, or joint effusion. Minimal narrowing of medial joint space is noted with osteophyte formation. Minimal patellar spurring is noted as well. Soft tissues are unremarkable.   IMPRESSION: Minimal degenerative joint disease. No acute abnormality seen.  03/11/24 - RIGHT KNEE - COMPLETE 4+ VIEW CLINICAL DATA:  Right knee pain.   FINDINGS: No acute fracture or  dislocation. The bones are well mineralized. Moderate tricompartmental narrowing and spurring. No joint effusion. The soft tissues are unremarkable.   IMPRESSION: 1. No acute fracture or dislocation. 2. Moderate tricompartmental osteoarthritis.  PATIENT SURVEYS:  LEFS  Extreme difficulty/unable (0), Quite a bit of difficulty (1), Moderate difficulty (2), Little difficulty (3), No difficulty (4) Survey date:  03/29/2024   Any of your usual work, housework or school activities 1  2. Usual hobbies, recreational or sporting activities 2  3. Getting into/out of the bath 2  4. Walking between rooms 2  5. Putting on socks/shoes 1  6. Squatting  2  7. Lifting an object, like a bag of groceries from the floor 2  8. Performing light activities around your home 3  9. Performing heavy activities around your home 1  10. Getting into/out of a car 2  11. Walking 2 blocks 1  12. Walking 1 mile 0  13. Going up/down 10 stairs (1 flight)  0  14. Standing for 1 hour 1  15.  sitting for 1 hour 0  16. Running on even ground 1  17. Running on uneven ground 0  18. Making sharp turns while running fast 0  19. Hopping  0  20. Rolling over in bed 2  Score total:  25 / 80 = 31.3 %     COGNITION: Overall cognitive status: Within functional limits for tasks assessed    SENSATION: WFL  EDEMA:  Circumferential: Mid patella R = 49.0 cm L = 50.5 cm  POSTURE:  Very slight genu recurvatum in standing  PALPATION: Decreased L>R patellar mobility with increased quad tightness  MUSCLE LENGTH: Hamstrings: very mild tight B ITB: mod tight B Piriformis: mild tight B Hip flexors: mild/mod tight B Quads: mod tight B Heelcord: WFL  LOWER EXTREMITY ROM:  Active ROM Right eval Left eval  Knee flexion 116 108  Knee extension 0 0  (Blank rows = not tested)  LOWER EXTREMITY MMT:  MMT Right eval Left eval  Hip flexion 4 4-  Hip extension 4 4  Hip abduction 4 4  Hip adduction 4+ 4+  Hip internal  rotation 4+ 4+  Hip external rotation 5 5  Knee flexion 5 4 p!  Knee extension 4+ 4- p!  Ankle dorsiflexion 5 5  Ankle plantarflexion 4 3+ *  Ankle inversion    Ankle eversion     (Blank rows = not tested)  LOWER EXTREMITY SPECIAL TESTS:  Knee special tests: Anterior drawer test: negative, Lachman Test: negative, Patellafemoral apprehension test: negative, and Lateral pull sign: positive   FUNCTIONAL TESTS: (1/29/(26) 5 times sit to stand: 15.25 sec 10 meter walk test: 13.53 sec Gait speed: 2.42 ft/sec  GAIT: Distance walked: clinic distances Assistive device utilized: None Level of assistance: Complete Independence Gait pattern: decreased stance time- Left and antalgic Comments: decreased gait speed   TODAY'S TREATMENT:    03/31/2024 THERAPEUTIC EXERCISE: To improve strength, endurance, ROM, and flexibility.  Demonstration, verbal and tactile cues throughout for technique.  NuStep - L4 x 7' - UE/LE Mod thomas L hip flexor stretch with strap 3 x 30 Prone L quad stretch with strap 3 x 30 Supine L quad set with towel roll under knee 10 x 3-5 Hooklying L SLR + hip ER x 10  NEUROMUSCULAR RE-EDUCATION: To improve strength, coordination, kinesthesia, and proprioception. Bridge + hip ADD ball squeeze 10 x 5  Bridge + GTB hip ABD isometric 10 x 5  B SAQ over 8 FR + ADD ball squeeze 10 x 3-5  THERAPEUTIC ACTIVITIES: To improve functional performance.  Demonstration, verbal and tactile cues throughout for technique. 5xSTS: 15.25 sec : 13.53 sec Gait speed: 2.42 ft/sec  MANUAL THERAPY: To promote pain modulation, reduced pain, and reduced edema utilizing kinesiotaping.  Ktape - B knees: chondromalacia patella pattern with slight medial patellar pull  SELF CARE:   Provided education in role of Kinesiotape for reducing muscle/joint tension and associated pain and improving posture/alignment, along with wearing and removal instructions - handout  provided.   03/29/2024 - Eval SELF CARE:  Reviewed eval findings and role of PT in addressing identified deficits as well as instruction in initial HEP (see below).    PATIENT EDUCATION:  Education details: HEP review and Ktape wearing and removal instructions  Person educated: Patient Education method: Programmer, Multimedia, Demonstration, Verbal cues, and Handouts Education comprehension: verbalized understanding, returned demonstration, verbal cues required, and needs further education  HOME EXERCISE PROGRAM: *Pt using MedBridge[employer]  app.  Access Code: [email or web address] URL: [email or web address] Date: 03/29/2024 Prepared by: Elijah Hidden  Exercises - Hip Flexor Stretch with Strap on Table  - 2 x daily - 7 x weekly - 3 reps - 30 sec hold - Prone Quad Stretch with Towel Roll and Strap  - 2 x daily - 7 x weekly - 3 reps - 30 sec hold - Supine Quad Set  - 2 x daily - 7 x weekly - 2 sets - 10 reps - 3 sec hold - Straight Leg Raise with External Rotation (Mirrored)  - 2 x daily - 7 x weekly - 1-2 sets - 10 reps - 3 sec hold - Bridge with Resistance  - 1 x daily - 7 x weekly - 2 sets - 10 reps - 5 sec hold - Supine Bridge with Mini Swiss Ball Between Knees  - 1 x daily - 7 x weekly - 2 sets - 10 reps - 5 sec hold   ASSESSMENT:  CLINICAL IMPRESSION: Marcelyn reports some increased discomfort in her R knee following the initial eval assessment but no pain currently.  She admits to having not attempted her HEP yet since the eval, therefore session initiated with review of HEP providing clarification for hold times and reps as well as technique PRN.  Patient fatigues quickly but denies increased pain.  SAQ with adduction isometric introduced to promote improved VMO activation.  Testing with 5xSTS nearly WNL, however gait speed indicating only limited community ambulation with increased pain reported if she attempts to increase speed.  Session concluded with trial application of Ktape to B knee with  patient noting immediate relief.  Sherlon will benefit from continued skilled PT to address ongoing ROM/flexibility and strength deficits to improve mobility and activity tolerance with decreased pain interference.   EVAL: KEYA WYNES is a 59 y.o. female who was referred to physical therapy for evaluation and treatment for acute L knee pain.  MRI performed today with results not yet available.  Patient reports onset of L knee pain beginning on 03/11/24 after a slip and fall on a wet floor in her dining room.  Pain was initially very severe creating significant difficulty with walking but has been gradually improving since she started the Naproxen .  Pain is worse with initiation of movement after prolonged sitting and with climbing stairs.  Patient has deficits in L knee ROM, proximal LE flexibility, B LE strength, abnormal posture with slight B genu recurvatum, and TTP with abnormal muscle tension and decreased patellar mobility which are interfering with ADLs and are impacting quality of life.  On LEFS patient scored 25/80 demonstrating moderate functional limitation.  Darcee will benefit from skilled PT to address above deficits to improve mobility and activity tolerance with decreased pain interference.  OBJECTIVE IMPAIRMENTS: Abnormal gait, decreased activity tolerance, decreased balance, decreased knowledge of condition, decreased mobility, difficulty walking, decreased ROM, decreased strength, increased fascial restrictions, impaired perceived functional ability, increased muscle spasms, impaired flexibility, improper body mechanics, postural dysfunction, and pain.   ACTIVITY LIMITATIONS: carrying, lifting, bending, sitting, standing, squatting, stairs, transfers, bed mobility, and locomotion level  PARTICIPATION LIMITATIONS: meal prep, cleaning, laundry, shopping, community activity, and occupation  PERSONAL FACTORS: Fitness, Past/current experiences, Profession, Time since onset of  injury/illness/exacerbation, and 3+ comorbidities: OA, DM-II, HTN, obesity, anxiety, depression, OSA, Hep C positive are also affecting patient's functional outcome.   REHAB POTENTIAL: Good  CLINICAL DECISION MAKING: Evolving/moderate complexity  EVALUATION COMPLEXITY: Moderate   GOALS: Goals reviewed with patient? Yes  SHORT TERM GOALS: Target date: 04/26/2024  Patient will be independent with initial HEP. Baseline: Initial HEP issued on eval Goal status: IN PROGRESS - 03/31/24 _ HEP reviewed with clarification provided PRN  2.  Patient will report at least 25% improvement in L knee pain to improve QOL. Baseline: 2-3/10 Goal status: INITIAL  LONG TERM GOALS: Target date: 05/24/2024  Patient will be independent with advanced/ongoing HEP to improve outcomes and carryover.  Baseline:  Goal status: INITIAL  2.  Patient will report at least 50-75% improvement in L knee pain to improve QOL. Baseline: 2-3/10 Goal status: INITIAL  3.  Patient will demonstrate improved L  knee AROM to >/= 0-115 deg to allow for normal gait and stair mechanics. Baseline: Refer to above LE ROM table Goal status: INITIAL  4.  Patient will demonstrate improved B LE strength to >/= 4+/5 for improved stability and ease of mobility. Baseline: Refer to above LE MMT table Goal status: INITIAL  5.  Patient will be able to ascend/descend stairs with 1 HR and reciprocal step pattern safely to access home and community.  Baseline:  Goal status: INITIAL  6.  Patient will report >/= 40/80 on LEFS (MCID = 9 pts) to demonstrate improved functional ability. Baseline: 25 / 80 = 31.3 % Goal status: INITIAL  7.  Patient will demonstrate at least 25/30 on FGA to decrease risk of falls. Baseline: TBA Goal status: INITIAL    PLAN:  PT FREQUENCY: 1-2x/week  PT DURATION: 8 weeks  PLANNED INTERVENTIONS: 02835- PT Re-evaluation, 97750- Physical Performance Testing, 97110-Therapeutic exercises, 97530- Therapeutic  activity, [id number]- Neuromuscular re-education, 97535- Self Care, 02859- Manual therapy, [phone]- Gait training, [phone]- Aquatic Therapy, [phone]- Electrical stimulation (unattended), 97035- Ultrasound, 02966- Ionotophoresis 4mg /ml Dexamethasone , Patient/Family education, Stair training, Taping, Joint mobilization, Cryotherapy, and Moist heat  PLAN FOR NEXT SESSION: Assess response to Ktape, gentle L/R knee ROM, progress quad and hip strengthening, reapplication of Ktape for knee support as benefit noted   Elijah CHRISTELLA Hidden, PT 03/31/2024, 5:22 PM  "

## 2024-04-04 ENCOUNTER — Ambulatory Visit: Admitting: Family Medicine

## 2024-04-05 ENCOUNTER — Telehealth

## 2024-04-05 DIAGNOSIS — M2342 Loose body in knee, left knee: Secondary | ICD-10-CM | POA: Diagnosis not present

## 2024-04-05 DIAGNOSIS — M7122 Synovial cyst of popliteal space [Baker], left knee: Secondary | ICD-10-CM

## 2024-04-05 DIAGNOSIS — M23322 Other meniscus derangements, posterior horn of medial meniscus, left knee: Secondary | ICD-10-CM

## 2024-04-05 DIAGNOSIS — M1712 Unilateral primary osteoarthritis, left knee: Secondary | ICD-10-CM | POA: Diagnosis not present

## 2024-04-05 NOTE — Progress Notes (Signed)
" ° ° °  Patient ID: Jennifer Rich, female    DOB: 59 y.o., July 01, 1965   MRN: [id number]   Chief Complaint: Left knee pain Reason for visit: Video Visit for MRI Result Review  Patient Location: [place], [place] Provider Location: [place],     History of Present Illness Jennifer Rich is a 59 year old female with left knee osteoarthritis, degenerative medial meniscus tear, and intra-articular loose bodies who presents for follow-up of persistent left knee pain.  Left Knee Pain and Functional Limitation: - Worsening chronic left knee pain over several weeks - Associated with swelling - Significant difficulty with ambulation, getting out of cars, and climbing stairs - Pain limits ability to clean and cook - No fever, chills, or malaise  Imaging Findings: - Recent left knee MRI demonstrates osteoarthritis, most pronounced in the patellofemoral compartment - Degenerative tearing of the medial meniscus - Intra-articular loose bodies, largest measuring approximately 0.5 cm  Physical Therapy and Functional Progress: - Initiated physical therapy two weeks ago, attending twice weekly sessions - Adherent to home exercise program - Initial sessions limited by deconditioning - Currently improved strength and function, especially with stair climbing - Estimates 20-25% overall symptom improvement - Able to perform household activities and stairs with less difficulty  Pharmacologic Management: - Naproxen  started for pain control - Reluctant to use naproxen  but has taken three doses since last visit - Improved pain control on days naproxen  was taken - Plans to continue physical therapy with upcoming sessions scheduled  03/29/2024 Left knee MRI 1. No acute abnormality. 2. Markedly diminutive and degenerated posterior horn of the medial meniscus with complex tearing throughout. 3. Advanced for age appearing osteoarthritis about the knee is worst in the patellofemoral  compartment. 4. Small joint effusion with a few loose bodies in the posterior aspect of the joint. 5. Small Baker's cyst.  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 She is improving with physical therapy and conservative management. Continue physical therapy twice weekly and a home exercise program to strengthen periarticular musculature and optimize biomechanics. Continue naproxen  as needed, particularly on physical therapy days, to maximize benefit and minimize post-therapy discomfort. If symptoms plateau or worsen, consider intra-articular corticosteroid injection for short-term relief. Follow-up is scheduled in 4-6 weeks to reassess progress. She should contact the office if she desires an injection or if symptoms worsen.  Primary osteoarthritis of left knee, predominantly patellofemoral compartment Primary osteoarthritis of the left knee is most severe in the patellofemoral compartment with near bone-on-bone changes under the patella and mild involvement elsewhere. This chronic condition is managed non-operatively, with approximately 20-25% improvement in symptoms over two weeks. Continue conservative management with physical therapy and NSAIDs as tolerated. Discussed escalation options if symptoms do not improve or worsen, including intra-articular corticosteroid injection or future consideration of patellofemoral cartilage replacement if indicated. Follow-up is scheduled in 4-6 weeks to reassess. Encourage  continued activity and avoidance of prolonged immobility.  Loose body of left knee Multiple loose bodies within the left knee joint, largest measuring 0.6 cm, are likely secondary to chronic meniscal and cartilage degeneration.    "

## 2024-04-06 ENCOUNTER — Ambulatory Visit: Admitting: Student

## 2024-04-06 ENCOUNTER — Ambulatory Visit: Payer: Self-pay

## 2024-04-06 NOTE — Assessment & Plan Note (Signed)
 Supplement and monitor   Last vitamin D  Lab Results  Component Value Date   VD25OH 22.63 (L) 12/14/2023

## 2024-04-06 NOTE — Progress Notes (Unsigned)
 "  Subjective:     Patient ID: Jennifer Rich, female    DOB: [birth year], 59 y.o.   MRN: [id number]  No chief complaint on file.   HPI  Discussed the use of AI scribe software for clinical note transcription with the patient, who gave verbal consent to proceed.  History of Present Illness          PMHx- DM 2, HLD, Asthma,  HTN Losartan -hydrochlorothiazide  100-25 mg daily, spirolactone 25 mg daily  Diabetes: - Checking glucose at home: *** -Home BS *** - Medications: Mounjaro  - Compliant with medications - Denies symptoms of hypoglycemia, polyuria, polydipsia, numbness extremities, foot ulcers/trauma, visual changes, wounds that are not healing, medication side effects   Wt Readings from Last 3 Encounters:  03/21/24 (!) 308 lb (139.7 kg)  03/11/24 (!) 308 lb 10.3 oz (140 kg)  01/24/24 (!) 315 lb (142.9 kg)    Patient denies fever, chills, SOB, CP, palpitations, dyspnea, edema, HA, vision changes, N/V/D, abdominal pain, urinary symptoms, rash, weight changes, and recent illness or hospitalizations.   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   BLASTOCYSTIS HOMINIS     CAP (community acquired pneumonia) 04/17/2015   Cervical cancer screening 01/25/2013   Chicken pox as a child   Fatty liver    Goiter    Mildly enlarged thyroid  TSH normal    Hepatitis C    was treated in 2022   Hyperglycemia    Hyperlipidemia    Hypertension    Obesity    Obesity, unspecified 04/26/2013   Pneumonia 05/02/2013   Preventative health  care 12/18/2012   Has seen Dr Levora for GYN in past   Sleep apnea    Type II or unspecified type diabetes mellitus without mention of complication, not stated as uncontrolled    Vitamin D  deficiency     Past Surgical History:  Procedure Laterality Date   WISDOM TOOTH EXTRACTION  59 yrs old    Family History  Problem Relation Age of Onset   High blood pressure Mother    High Cholesterol Mother    High blood pressure Father    Alcoholism Father    Hypertension Sister    Hypertension Sister    Diabetes Maternal Grandmother        type 2   Blindness Maternal Grandmother    Heart attack Maternal Grandfather 75   Heart disease Maternal Grandfather    Hypertension Paternal Grandmother    Stroke Paternal Grandfather    Colon cancer Neg Hx    Esophageal cancer Neg Hx    Stomach cancer Neg Hx    BRCA 1/2 Neg Hx    Breast cancer Neg Hx     Social History   Socioeconomic History   Marital status: Married    Spouse name: Not on file   Number of children: 0   Years of education: Not on file   Highest education level: Associate degree: occupational, scientist, product/process development, or vocational program  Occupational History  Occupation: Surveyor, Minerals: [employer]  Tobacco Use   Smoking status: Never   Smokeless tobacco: Never  Vaping Use   Vaping status: Never Used  Substance and Sexual Activity   Alcohol use: No   Drug use: No   Sexual activity: Yes    Partners: Male    Comment: lives with husband and [place], [name], no dietary restrictions.  Other Topics Concern   Not on file  Social History Narrative   No Pork         Social Drivers of Health   Tobacco Use: Low Risk (04/05/2024)   Patient History    Smoking Tobacco Use: Never    Smokeless Tobacco Use: Never    Passive Exposure: Not on file  Financial Resource Strain: Low Risk (05/01/2023)   Overall Financial Resource Strain (CARDIA)    Difficulty of Paying Living Expenses: Not hard at all  Food Insecurity: No  Food Insecurity (05/01/2023)   Hunger Vital Sign    Worried About Running Out of Food in the Last Year: Never true    Ran Out of Food in the Last Year: Never true  Transportation Needs: No Transportation Needs (05/01/2023)   PRAPARE - Administrator, Civil Service (Medical): No    Lack of Transportation (Non-Medical): No  Physical Activity: Insufficiently Active (05/01/2023)   Exercise Vital Sign    Days of Exercise per Week: 1 day    Minutes of Exercise per Session: 10 min  Stress: Stress Concern Present (05/01/2023)   [employer] of Occupational Health - Occupational Stress Questionnaire    Feeling of Stress : To some extent  Social Connections: Socially Integrated (05/01/2023)   Social Connection and Isolation Panel    Frequency of Communication with Friends and Family: More than three times a week    Frequency of Social Gatherings with Friends and Family: More than three times a week    Attends Religious Services: More than 4 times per year    Active Member of [employer] or Organizations: Yes    Attends Banker Meetings: Patient declined    Marital Status: Married  Catering Manager Violence: Unknown (06/04/2021)   Received from [facility]   HITS    Physically Hurt: Not on file    Insult or Talk Down To: Not on file    Threaten Physical Harm: Not on file    Scream or Curse: Not on file  Depression (PHQ2-9): Low Risk (12/09/2022)   Depression (PHQ2-9)    PHQ-2 Score: 0  Alcohol Screen: Low Risk (05/01/2023)   Alcohol Screen    Last Alcohol Screening Score (AUDIT): 1  Housing: High Risk (05/01/2023)   Housing Stability Vital Sign    Unable to Pay for Housing in the Last Year: Yes    Number of Times Moved in the Last Year: 0    Homeless in the Last Year: No  Utilities: Not on file  Health Literacy: Not on file    Outpatient Medications Prior to Visit  Medication Sig Dispense Refill   albuterol  (VENTOLIN  HFA) 108 (90 Base) MCG/ACT inhaler Inhale 1-2  puffs into the lungs every 6 (six) hours as needed. 8 g 0   ALPRAZolam  (XANAX ) 0.25 MG tablet Take 1 tablet (0.25 mg total) by mouth 2 (two) times daily as needed for anxiety. 10 tablet 0   atorvastatin  (LIPITOR) 20 MG tablet Take 1 tablet (20 mg total) by mouth daily. 90 tablet 1   azelastine  (ASTELIN ) 0.1 %  nasal spray Place 2 sprays into both nostrils 2 (two) times daily. Use in each nostril as directed 30 mL 1   Continuous Glucose Receiver (FREESTYLE LIBRE 3 READER) DEVI Check blood sugar 1 each 0   Continuous Glucose Sensor (FREESTYLE LIBRE 3 SENSOR) MISC Place 1 sensor on the skin every 14 days. Use to check glucose continuously 2 each 2   glucose blood (FREESTYLE TEST STRIPS) test strip Use as instructed 100 each 12   Lancets (FREESTYLE) lancets Use as instructed 100 each 12   losartan -hydrochlorothiazide  (HYZAAR ) 100-25 MG tablet Take 1 tablet by mouth daily. 90 tablet 1   naproxen  (NAPROSYN ) 500 MG tablet Take 1 tablet (500 mg total) by mouth 2 (two) times daily for 20 days. 40 tablet 0   olopatadine  (PATANOL) 0.1 % ophthalmic solution Place 1 drop into both eyes 2 (two) times daily. (Patient not taking: Reported on 03/29/2024) 5 mL 12   omeprazole  (PRILOSEC) 20 MG capsule Take 1 capsule (20 mg total) by mouth daily. 30 capsule 0   promethazine -dextromethorphan (PROMETHAZINE -DM) 6.25-15 MG/5ML syrup Take 5 mLs by mouth 4 (four) times daily as needed. 118 mL 0   spironolactone  (ALDACTONE ) 25 MG tablet Take 1 tablet (25 mg total) by mouth 2 (two) times daily. 180 tablet 1   tirzepatide  (MOUNJARO ) 5 MG/0.5ML Pen Inject 5 mg into the skin once a week. 6 mL 1   valACYclovir  (VALTREX ) 500 MG tablet Take 500 mg by mouth as needed.     Vitamin D , Ergocalciferol , (DRISDOL ) 1.25 MG (50000 UNIT) CAPS capsule TAKE 1 CAPSULE BY MOUTH EVERY 7 DAYS 4 capsule 0   benzonatate  (TESSALON ) 100 MG capsule Take 1 capsule (100 mg total) by mouth 3 (three) times daily as needed for cough. (Patient not taking:  Reported on 03/29/2024) 30 capsule 0   cyclobenzaprine  (FLEXERIL ) 10 MG tablet Take 1 tablet (10 mg total) by mouth 2 (two) times daily as needed for muscle spasms. (Patient not taking: Reported on 03/29/2024) 20 tablet 0   guaiFENesin  (MUCINEX ) 600 MG 12 hr tablet Take 1 tablet (600 mg total) by mouth 2 (two) times daily as needed. (Patient not taking: Reported on 03/29/2024) 30 tablet 0   No facility-administered medications prior to visit.    Allergies[1]  ROS    See HPI Objective:    Physical Exam Vitals reviewed.  Constitutional:      General: She is not in acute distress.    Appearance: She is obese. She is not toxic-appearing.  HENT:     Head: Normocephalic and atraumatic.     Mouth/Throat:     Mouth: Mucous membranes are moist.     Pharynx: Oropharynx is clear.  Eyes:     Pupils: Pupils are equal, round, and reactive to light.  Cardiovascular:     Rate and Rhythm: Normal rate and regular rhythm.     Pulses: Normal pulses.     Heart sounds: Normal heart sounds. No murmur heard. Pulmonary:     Effort: Pulmonary effort is normal. No respiratory distress.     Breath sounds: Normal breath sounds. No wheezing.  Musculoskeletal:        General: No swelling.     Cervical back: Neck supple.  Skin:    General: Skin is warm and dry.  Neurological:     General: No focal deficit present.     Mental Status: She is alert and oriented to person, place, and time.  Psychiatric:        Mood  and Affect: Mood normal.        Behavior: Behavior normal.        Thought Content: Thought content normal.        Judgment: Judgment normal.      LMP 09/14/2017  Wt Readings from Last 3 Encounters:  03/21/24 (!) 308 lb (139.7 kg)  03/11/24 (!) 308 lb 10.3 oz (140 kg)  01/24/24 (!) 315 lb (142.9 kg)       Assessment & Plan:   Problem List Items Addressed This Visit       Cardiovascular and Mediastinum   Essential hypertension, benign - Primary   Well controlled, no changes to  meds. Encouraged heart healthy diet such as the DASH diet and exercise as tolerated.          Digestive   Fatty liver     Endocrine   Type 2 diabetes mellitus in patient with obesity (HCC)   Managed with Mounjaro . Tolerating well. On arb On statin FU 3 months        Other   Class 3 severe obesity with serious comorbidity and body mass index (BMI) of 50.0 to 59.9 in adult [facility])   Avoid trans fats, eat small, frequent meals every 4-5 hours with lean proteins, complex carbs and healthy fats. Minimize simple carbs, high fat foods and processed foods.       Hepatitis C antibody test positive   Hyperlipidemia   Tolerating statin. LDL goal <70. Encourage heart healthy diet such as MIND or DASH diet, increase exercise, avoid trans fats, simple carbohydrates and processed foods, consider a krill or fish or flaxseed oil cap daily.    The 10-year ASCVD risk score (Arnett DK, et al., 2019) is: 25.5%   Values used to calculate the score:     Age: 48 years     Clinically relevant sex: Female     Is Non-Hispanic African American: Yes     Diabetic: Yes     Tobacco smoker: No     Systolic Blood Pressure: 146 mmHg     Is BP treated: Yes     HDL Cholesterol: 41.1 mg/dL     Total Cholesterol: 211 mg/dL       Vitamin B 12 deficiency   Supplement and monitor   Lab Results  Component Value Date   VITAMINB12 350 12/14/2023         Vitamin D  deficiency   Supplement and monitor   Last vitamin D  Lab Results  Component Value Date   VD25OH 22.63 (L) 12/14/2023          I have discontinued Sybel A. Porta's benzonatate , cyclobenzaprine , and guaiFENesin . I am also having her maintain her valACYclovir , omeprazole , FREESTYLE TEST STRIPS, FreeStyle Libre 3 Sensor, freestyle, [employer] 3 Reader, ALPRAZolam , losartan -hydrochlorothiazide , azelastine , olopatadine , Vitamin D  (Ergocalciferol ), atorvastatin , tirzepatide , albuterol , promethazine -dextromethorphan, spironolactone , and  naproxen .  No orders of the defined types were placed in this encounter.     [1]  Allergies Allergen Reactions   Metoprolol  Other (See Comments)   Lisinopril Cough and Other (See Comments)   "

## 2024-04-06 NOTE — Assessment & Plan Note (Signed)
 Tolerating statin. LDL goal <70. Encourage heart healthy diet such as MIND or DASH diet, increase exercise, avoid trans fats, simple carbohydrates and processed foods, consider a krill or fish or flaxseed oil cap daily.    The 10-year ASCVD risk score (Arnett DK, et al., 2019) is: 25.5%   Values used to calculate the score:     Age: 59 years     Clinically relevant sex: Female     Is Non-Hispanic African American: Yes     Diabetic: Yes     Tobacco smoker: No     Systolic Blood Pressure: 146 mmHg     Is BP treated: Yes     HDL Cholesterol: 41.1 mg/dL     Total Cholesterol: 211 mg/dL

## 2024-04-06 NOTE — Assessment & Plan Note (Addendum)
 Supplement and monitor   Lab Results  Component Value Date   VITAMINB12 350 12/14/2023

## 2024-04-06 NOTE — Assessment & Plan Note (Signed)
 Managed with Mounjaro . Tolerating well. On arb On statin FU 3 months

## 2024-04-06 NOTE — Assessment & Plan Note (Signed)
 Well controlled, no changes to meds. Encouraged heart healthy diet such as the DASH diet and exercise as tolerated.

## 2024-04-06 NOTE — Assessment & Plan Note (Signed)
 Avoid trans fats, eat small, frequent meals every 4-5 hours with lean proteins, complex carbs and healthy fats. Minimize simple carbs, high fat foods and processed foods.

## 2024-04-07 ENCOUNTER — Ambulatory Visit: Admitting: Student

## 2024-04-13 ENCOUNTER — Ambulatory Visit

## 2024-04-14 ENCOUNTER — Ambulatory Visit: Admitting: Physical Therapy

## 2024-04-19 ENCOUNTER — Ambulatory Visit: Admitting: Physical Therapy

## 2024-04-21 ENCOUNTER — Ambulatory Visit: Admitting: Physical Therapy

## 2024-04-26 ENCOUNTER — Ambulatory Visit: Admitting: Physical Therapy

## 2024-04-28 ENCOUNTER — Ambulatory Visit: Admitting: Physical Therapy

## 2024-05-03 ENCOUNTER — Ambulatory Visit: Admitting: Physical Therapy
